# Patient Record
Sex: Female | Born: 1945
Health system: Southern US, Community
[De-identification: ages and names within clinical notes are randomized; demographics above are authoritative.]

## PROBLEM LIST (undated history)

## (undated) DIAGNOSIS — R399 Unspecified symptoms and signs involving the genitourinary system: Secondary | ICD-10-CM

## (undated) DIAGNOSIS — R319 Hematuria, unspecified: Secondary | ICD-10-CM

## (undated) DIAGNOSIS — Z860101 Personal history of adenomatous and serrated colon polyps: Secondary | ICD-10-CM

## (undated) DIAGNOSIS — Z87442 Personal history of urinary calculi: Secondary | ICD-10-CM

## (undated) DIAGNOSIS — Z86010 Personal history of colonic polyps: Secondary | ICD-10-CM

## (undated) DIAGNOSIS — L309 Dermatitis, unspecified: Secondary | ICD-10-CM

## (undated) DIAGNOSIS — Z8719 Personal history of other diseases of the digestive system: Secondary | ICD-10-CM

## (undated) DIAGNOSIS — K5730 Diverticulosis of large intestine without perforation or abscess without bleeding: Secondary | ICD-10-CM

## (undated) DIAGNOSIS — E785 Hyperlipidemia, unspecified: Secondary | ICD-10-CM

## (undated) DIAGNOSIS — J189 Pneumonia, unspecified organism: Secondary | ICD-10-CM

## (undated) DIAGNOSIS — C801 Malignant (primary) neoplasm, unspecified: Secondary | ICD-10-CM

## (undated) DIAGNOSIS — Z973 Presence of spectacles and contact lenses: Secondary | ICD-10-CM

## (undated) DIAGNOSIS — R600 Localized edema: Secondary | ICD-10-CM

## (undated) DIAGNOSIS — K449 Diaphragmatic hernia without obstruction or gangrene: Secondary | ICD-10-CM

## (undated) DIAGNOSIS — D494 Neoplasm of unspecified behavior of bladder: Secondary | ICD-10-CM

## (undated) DIAGNOSIS — C679 Malignant neoplasm of bladder, unspecified: Secondary | ICD-10-CM

## (undated) DIAGNOSIS — Z923 Personal history of irradiation: Secondary | ICD-10-CM

## (undated) DIAGNOSIS — K2970 Gastritis, unspecified, without bleeding: Secondary | ICD-10-CM

## (undated) DIAGNOSIS — I10 Essential (primary) hypertension: Secondary | ICD-10-CM

## (undated) DIAGNOSIS — K5792 Diverticulitis of intestine, part unspecified, without perforation or abscess without bleeding: Secondary | ICD-10-CM

## (undated) DIAGNOSIS — N200 Calculus of kidney: Secondary | ICD-10-CM

## (undated) DIAGNOSIS — K635 Polyp of colon: Secondary | ICD-10-CM

## (undated) DIAGNOSIS — I2510 Atherosclerotic heart disease of native coronary artery without angina pectoris: Secondary | ICD-10-CM

## (undated) DIAGNOSIS — K219 Gastro-esophageal reflux disease without esophagitis: Secondary | ICD-10-CM

## (undated) DIAGNOSIS — Z972 Presence of dental prosthetic device (complete) (partial): Secondary | ICD-10-CM

## (undated) DIAGNOSIS — M797 Fibromyalgia: Secondary | ICD-10-CM

## (undated) DIAGNOSIS — Z5189 Encounter for other specified aftercare: Secondary | ICD-10-CM

## (undated) DIAGNOSIS — I890 Lymphedema, not elsewhere classified: Secondary | ICD-10-CM

## (undated) HISTORY — DX: Hyperlipidemia, unspecified: E78.5

## (undated) HISTORY — PX: CYST EXCISION: SHX5701

## (undated) HISTORY — DX: Polyp of colon: K63.5

## (undated) HISTORY — DX: Malignant (primary) neoplasm, unspecified: C80.1

## (undated) HISTORY — PX: APPENDECTOMY: SHX54

## (undated) HISTORY — DX: Essential (primary) hypertension: I10

## (undated) HISTORY — DX: Gastro-esophageal reflux disease without esophagitis: K21.9

## (undated) HISTORY — DX: Diverticulitis of intestine, part unspecified, without perforation or abscess without bleeding: K57.92

## (undated) HISTORY — DX: Atherosclerotic heart disease of native coronary artery without angina pectoris: I25.10

## (undated) HISTORY — DX: Gastritis, unspecified, without bleeding: K29.70

## (undated) HISTORY — PX: TONSILLECTOMY: SUR1361

## (undated) HISTORY — DX: Malignant neoplasm of bladder, unspecified: C67.9

## (undated) HISTORY — PX: ABDOMINAL HYSTERECTOMY: SHX81

## (undated) HISTORY — PX: ROTATOR CUFF REPAIR: SHX139

## (undated) HISTORY — DX: Fibromyalgia: M79.7

## (undated) HISTORY — PX: BREAST SURGERY: SHX581

## (undated) HISTORY — PX: OTHER SURGICAL HISTORY: SHX169

## (undated) HISTORY — PX: NASAL SINUS SURGERY: SHX719

## (undated) HISTORY — PX: ANTERIOR AND POSTERIOR REPAIR WITH SACROSPINOUS FIXATION: SHX6536

## (undated) HISTORY — DX: Encounter for other specified aftercare: Z51.89

## (undated) HISTORY — PX: BREAST RECONSTRUCTION: SHX9

## (undated) HISTORY — PX: CARDIAC CATHETERIZATION: SHX172

## (undated) HISTORY — PX: ABDOMINAL ADHESION SURGERY: SHX90

## (undated) HISTORY — PX: KNEE SURGERY: SHX244

---

## 1976-04-22 HISTORY — PX: ABDOMINAL HYSTERECTOMY: SHX81

## 1983-04-23 HISTORY — PX: ROTATOR CUFF REPAIR: SHX139

## 1984-04-22 HISTORY — PX: KNEE ARTHROSCOPY: SUR90

## 1997-11-24 ENCOUNTER — Encounter: Admission: RE | Admit: 1997-11-24 | Discharge: 1998-02-22 | Payer: Self-pay | Admitting: Specialist

## 1998-01-31 ENCOUNTER — Observation Stay (HOSPITAL_COMMUNITY): Admission: RE | Admit: 1998-01-31 | Discharge: 1998-02-01 | Payer: Self-pay | Admitting: Orthopedic Surgery

## 1998-01-31 ENCOUNTER — Encounter: Payer: Self-pay | Admitting: Orthopedic Surgery

## 1998-04-22 DIAGNOSIS — Z923 Personal history of irradiation: Secondary | ICD-10-CM

## 1998-04-22 DIAGNOSIS — Z9221 Personal history of antineoplastic chemotherapy: Secondary | ICD-10-CM

## 1998-04-22 DIAGNOSIS — Z853 Personal history of malignant neoplasm of breast: Secondary | ICD-10-CM

## 1998-04-22 HISTORY — PX: REDUCTION MAMMAPLASTY: SUR839

## 1998-04-22 HISTORY — PX: BREAST REDUCTION SURGERY: SHX8

## 1998-04-22 HISTORY — PX: MASTECTOMY: SHX3

## 1998-04-22 HISTORY — DX: Personal history of malignant neoplasm of breast: Z85.3

## 1998-04-22 HISTORY — DX: Personal history of antineoplastic chemotherapy: Z92.21

## 1998-04-22 HISTORY — DX: Personal history of irradiation: Z92.3

## 1998-04-22 HISTORY — PX: MASTECTOMY WITH AXILLARY LYMPH NODE DISSECTION: SHX5661

## 1998-07-14 ENCOUNTER — Ambulatory Visit (HOSPITAL_COMMUNITY): Admission: RE | Admit: 1998-07-14 | Discharge: 1998-07-14 | Payer: Self-pay | Admitting: Orthopedic Surgery

## 1998-12-12 ENCOUNTER — Other Ambulatory Visit: Admission: RE | Admit: 1998-12-12 | Discharge: 1998-12-12 | Payer: Self-pay | Admitting: General Surgery

## 1998-12-29 ENCOUNTER — Inpatient Hospital Stay (HOSPITAL_COMMUNITY): Admission: AD | Admit: 1998-12-29 | Discharge: 1999-01-03 | Payer: Self-pay | Admitting: General Surgery

## 1999-02-06 ENCOUNTER — Encounter: Admission: RE | Admit: 1999-02-06 | Discharge: 1999-04-04 | Payer: Self-pay | Admitting: Plastic Surgery

## 1999-02-13 ENCOUNTER — Encounter: Payer: Self-pay | Admitting: *Deleted

## 1999-02-13 ENCOUNTER — Ambulatory Visit (HOSPITAL_COMMUNITY): Admission: RE | Admit: 1999-02-13 | Discharge: 1999-02-13 | Payer: Self-pay | Admitting: *Deleted

## 1999-02-14 ENCOUNTER — Ambulatory Visit (HOSPITAL_COMMUNITY): Admission: RE | Admit: 1999-02-14 | Discharge: 1999-02-14 | Payer: Self-pay | Admitting: General Surgery

## 1999-02-14 ENCOUNTER — Encounter (HOSPITAL_BASED_OUTPATIENT_CLINIC_OR_DEPARTMENT_OTHER): Payer: Self-pay | Admitting: General Surgery

## 1999-02-15 ENCOUNTER — Encounter: Payer: Self-pay | Admitting: *Deleted

## 1999-02-15 ENCOUNTER — Ambulatory Visit (HOSPITAL_COMMUNITY): Admission: RE | Admit: 1999-02-15 | Discharge: 1999-02-15 | Payer: Self-pay | Admitting: *Deleted

## 1999-03-30 ENCOUNTER — Ambulatory Visit (HOSPITAL_COMMUNITY): Admission: RE | Admit: 1999-03-30 | Discharge: 1999-03-30 | Payer: Self-pay | Admitting: *Deleted

## 1999-03-30 ENCOUNTER — Encounter: Payer: Self-pay | Admitting: Gastroenterology

## 1999-07-13 ENCOUNTER — Encounter: Payer: Self-pay | Admitting: *Deleted

## 1999-07-13 ENCOUNTER — Encounter: Admission: RE | Admit: 1999-07-13 | Discharge: 1999-07-13 | Payer: Self-pay | Admitting: *Deleted

## 1999-07-23 ENCOUNTER — Ambulatory Visit (HOSPITAL_COMMUNITY): Admission: RE | Admit: 1999-07-23 | Discharge: 1999-07-23 | Payer: Self-pay | Admitting: *Deleted

## 1999-07-23 ENCOUNTER — Encounter: Payer: Self-pay | Admitting: *Deleted

## 1999-07-25 ENCOUNTER — Encounter: Admission: RE | Admit: 1999-07-25 | Discharge: 1999-10-23 | Payer: Self-pay | Admitting: Radiation Oncology

## 1999-08-31 ENCOUNTER — Other Ambulatory Visit: Admission: RE | Admit: 1999-08-31 | Discharge: 1999-08-31 | Payer: Self-pay | Admitting: Obstetrics and Gynecology

## 1999-09-04 ENCOUNTER — Encounter: Admission: RE | Admit: 1999-09-04 | Discharge: 1999-09-04 | Payer: Self-pay | Admitting: Obstetrics and Gynecology

## 1999-09-04 ENCOUNTER — Encounter: Payer: Self-pay | Admitting: Obstetrics and Gynecology

## 1999-09-24 ENCOUNTER — Ambulatory Visit (HOSPITAL_COMMUNITY): Admission: RE | Admit: 1999-09-24 | Discharge: 1999-09-24 | Payer: Self-pay | Admitting: Oncology

## 1999-09-27 ENCOUNTER — Encounter: Admission: RE | Admit: 1999-09-27 | Discharge: 1999-09-27 | Payer: Self-pay | Admitting: *Deleted

## 1999-09-27 ENCOUNTER — Encounter: Payer: Self-pay | Admitting: *Deleted

## 1999-10-02 ENCOUNTER — Ambulatory Visit (HOSPITAL_COMMUNITY): Admission: RE | Admit: 1999-10-02 | Discharge: 1999-10-02 | Payer: Self-pay | Admitting: *Deleted

## 1999-10-02 ENCOUNTER — Encounter: Payer: Self-pay | Admitting: *Deleted

## 1999-10-10 ENCOUNTER — Encounter: Admission: RE | Admit: 1999-10-10 | Discharge: 1999-12-03 | Payer: Self-pay | Admitting: *Deleted

## 1999-11-06 ENCOUNTER — Encounter (INDEPENDENT_AMBULATORY_CARE_PROVIDER_SITE_OTHER): Payer: Self-pay | Admitting: *Deleted

## 1999-11-06 ENCOUNTER — Ambulatory Visit (HOSPITAL_COMMUNITY): Admission: RE | Admit: 1999-11-06 | Discharge: 1999-11-06 | Payer: Self-pay | Admitting: General Surgery

## 1999-11-06 ENCOUNTER — Ambulatory Visit (HOSPITAL_COMMUNITY): Admission: RE | Admit: 1999-11-06 | Discharge: 1999-11-06 | Payer: Self-pay | Admitting: Gastroenterology

## 2000-01-16 ENCOUNTER — Ambulatory Visit (HOSPITAL_COMMUNITY): Admission: RE | Admit: 2000-01-16 | Discharge: 2000-01-16 | Payer: Self-pay | Admitting: Gastroenterology

## 2000-01-16 ENCOUNTER — Encounter: Payer: Self-pay | Admitting: Gastroenterology

## 2000-01-23 ENCOUNTER — Ambulatory Visit (HOSPITAL_COMMUNITY): Admission: RE | Admit: 2000-01-23 | Discharge: 2000-01-23 | Payer: Self-pay | Admitting: *Deleted

## 2000-01-23 ENCOUNTER — Encounter: Payer: Self-pay | Admitting: *Deleted

## 2000-01-29 ENCOUNTER — Encounter: Admission: RE | Admit: 2000-01-29 | Discharge: 2000-01-29 | Payer: Self-pay | Admitting: Nephrology

## 2000-01-29 ENCOUNTER — Encounter: Payer: Self-pay | Admitting: Nephrology

## 2000-02-06 ENCOUNTER — Encounter (INDEPENDENT_AMBULATORY_CARE_PROVIDER_SITE_OTHER): Payer: Self-pay | Admitting: *Deleted

## 2000-02-06 ENCOUNTER — Ambulatory Visit (HOSPITAL_BASED_OUTPATIENT_CLINIC_OR_DEPARTMENT_OTHER): Admission: RE | Admit: 2000-02-06 | Discharge: 2000-02-06 | Payer: Self-pay | Admitting: Plastic Surgery

## 2000-03-18 ENCOUNTER — Ambulatory Visit (HOSPITAL_COMMUNITY): Admission: RE | Admit: 2000-03-18 | Discharge: 2000-03-18 | Payer: Self-pay | Admitting: Gastroenterology

## 2000-07-03 ENCOUNTER — Encounter: Payer: Self-pay | Admitting: *Deleted

## 2000-07-03 ENCOUNTER — Encounter: Admission: RE | Admit: 2000-07-03 | Discharge: 2000-07-03 | Payer: Self-pay | Admitting: *Deleted

## 2000-07-07 ENCOUNTER — Ambulatory Visit (HOSPITAL_BASED_OUTPATIENT_CLINIC_OR_DEPARTMENT_OTHER): Admission: RE | Admit: 2000-07-07 | Discharge: 2000-07-07 | Payer: Self-pay | Admitting: Orthopedic Surgery

## 2000-09-04 ENCOUNTER — Other Ambulatory Visit: Admission: RE | Admit: 2000-09-04 | Discharge: 2000-09-04 | Payer: Self-pay | Admitting: Obstetrics and Gynecology

## 2001-05-25 ENCOUNTER — Encounter: Payer: Self-pay | Admitting: Oncology

## 2001-05-25 ENCOUNTER — Ambulatory Visit (HOSPITAL_COMMUNITY): Admission: RE | Admit: 2001-05-25 | Discharge: 2001-05-25 | Payer: Self-pay | Admitting: Oncology

## 2001-08-06 ENCOUNTER — Encounter: Payer: Self-pay | Admitting: Oncology

## 2001-08-06 ENCOUNTER — Encounter: Admission: RE | Admit: 2001-08-06 | Discharge: 2001-08-06 | Payer: Self-pay | Admitting: Oncology

## 2001-08-12 ENCOUNTER — Encounter: Admission: RE | Admit: 2001-08-12 | Discharge: 2001-08-12 | Payer: Self-pay | Admitting: Oncology

## 2001-08-12 ENCOUNTER — Encounter: Payer: Self-pay | Admitting: Oncology

## 2002-01-25 ENCOUNTER — Encounter: Admission: RE | Admit: 2002-01-25 | Discharge: 2002-01-25 | Payer: Self-pay | Admitting: Oncology

## 2002-01-25 ENCOUNTER — Encounter: Payer: Self-pay | Admitting: Oncology

## 2002-02-03 ENCOUNTER — Encounter: Payer: Self-pay | Admitting: Rheumatology

## 2002-02-03 ENCOUNTER — Ambulatory Visit (HOSPITAL_COMMUNITY): Admission: RE | Admit: 2002-02-03 | Discharge: 2002-02-03 | Payer: Self-pay | Admitting: Interventional Radiology

## 2002-08-24 ENCOUNTER — Encounter: Payer: Self-pay | Admitting: Rheumatology

## 2002-08-24 ENCOUNTER — Ambulatory Visit (HOSPITAL_COMMUNITY): Admission: RE | Admit: 2002-08-24 | Discharge: 2002-08-24 | Payer: Self-pay | Admitting: Rheumatology

## 2002-09-09 ENCOUNTER — Ambulatory Visit (HOSPITAL_COMMUNITY): Admission: RE | Admit: 2002-09-09 | Discharge: 2002-09-09 | Payer: Self-pay | Admitting: Rheumatology

## 2002-09-09 ENCOUNTER — Encounter: Payer: Self-pay | Admitting: Rheumatology

## 2002-09-16 ENCOUNTER — Encounter: Payer: Self-pay | Admitting: Nephrology

## 2002-09-16 ENCOUNTER — Encounter: Admission: RE | Admit: 2002-09-16 | Discharge: 2002-09-16 | Payer: Self-pay | Admitting: Nephrology

## 2002-09-22 ENCOUNTER — Encounter: Payer: Self-pay | Admitting: Cardiology

## 2002-09-22 ENCOUNTER — Ambulatory Visit (HOSPITAL_COMMUNITY): Admission: RE | Admit: 2002-09-22 | Discharge: 2002-09-22 | Payer: Self-pay | Admitting: Cardiology

## 2002-12-16 ENCOUNTER — Other Ambulatory Visit: Admission: RE | Admit: 2002-12-16 | Discharge: 2002-12-16 | Payer: Self-pay | Admitting: Gynecology

## 2002-12-17 ENCOUNTER — Encounter: Admission: RE | Admit: 2002-12-17 | Discharge: 2002-12-17 | Payer: Self-pay | Admitting: Oncology

## 2002-12-17 ENCOUNTER — Encounter: Payer: Self-pay | Admitting: Oncology

## 2003-01-07 ENCOUNTER — Ambulatory Visit (HOSPITAL_COMMUNITY): Admission: RE | Admit: 2003-01-07 | Discharge: 2003-01-07 | Payer: Self-pay | Admitting: Gastroenterology

## 2003-02-18 ENCOUNTER — Encounter: Admission: RE | Admit: 2003-02-18 | Discharge: 2003-02-18 | Payer: Self-pay | Admitting: Oncology

## 2003-05-10 ENCOUNTER — Inpatient Hospital Stay (HOSPITAL_BASED_OUTPATIENT_CLINIC_OR_DEPARTMENT_OTHER): Admission: RE | Admit: 2003-05-10 | Discharge: 2003-05-10 | Payer: Self-pay | Admitting: Cardiology

## 2003-06-28 ENCOUNTER — Encounter: Admission: RE | Admit: 2003-06-28 | Discharge: 2003-06-28 | Payer: Self-pay | Admitting: Nephrology

## 2003-11-05 ENCOUNTER — Encounter: Payer: Self-pay | Admitting: Emergency Medicine

## 2003-11-06 ENCOUNTER — Inpatient Hospital Stay (HOSPITAL_COMMUNITY): Admission: AD | Admit: 2003-11-06 | Discharge: 2003-11-11 | Payer: Self-pay | Admitting: Pulmonary Disease

## 2003-12-05 ENCOUNTER — Ambulatory Visit (HOSPITAL_COMMUNITY): Admission: RE | Admit: 2003-12-05 | Discharge: 2003-12-05 | Payer: Self-pay | Admitting: Gastroenterology

## 2003-12-19 ENCOUNTER — Other Ambulatory Visit: Admission: RE | Admit: 2003-12-19 | Discharge: 2003-12-19 | Payer: Self-pay | Admitting: Gynecology

## 2004-02-17 ENCOUNTER — Encounter: Admission: RE | Admit: 2004-02-17 | Discharge: 2004-02-17 | Payer: Self-pay | Admitting: Oncology

## 2004-05-16 ENCOUNTER — Encounter: Admission: RE | Admit: 2004-05-16 | Discharge: 2004-05-16 | Payer: Self-pay | Admitting: Nephrology

## 2004-05-21 ENCOUNTER — Ambulatory Visit: Payer: Self-pay | Admitting: Oncology

## 2004-05-24 ENCOUNTER — Encounter: Admission: RE | Admit: 2004-05-24 | Discharge: 2004-06-22 | Payer: Self-pay | Admitting: Nephrology

## 2004-07-03 ENCOUNTER — Encounter: Admission: RE | Admit: 2004-07-03 | Discharge: 2004-07-03 | Payer: Self-pay | Admitting: Cardiology

## 2004-07-12 ENCOUNTER — Inpatient Hospital Stay (HOSPITAL_COMMUNITY): Admission: EM | Admit: 2004-07-12 | Discharge: 2004-07-23 | Payer: Self-pay | Admitting: Family Medicine

## 2004-07-13 ENCOUNTER — Encounter: Payer: Self-pay | Admitting: Nephrology

## 2004-07-30 ENCOUNTER — Encounter: Admission: RE | Admit: 2004-07-30 | Discharge: 2004-07-30 | Payer: Self-pay | Admitting: Nephrology

## 2004-10-05 ENCOUNTER — Ambulatory Visit (HOSPITAL_COMMUNITY): Admission: RE | Admit: 2004-10-05 | Discharge: 2004-10-05 | Payer: Self-pay | Admitting: Gastroenterology

## 2004-10-05 ENCOUNTER — Encounter (INDEPENDENT_AMBULATORY_CARE_PROVIDER_SITE_OTHER): Payer: Self-pay | Admitting: *Deleted

## 2004-11-19 ENCOUNTER — Ambulatory Visit: Payer: Self-pay | Admitting: Oncology

## 2004-11-27 ENCOUNTER — Ambulatory Visit (HOSPITAL_COMMUNITY): Admission: RE | Admit: 2004-11-27 | Discharge: 2004-11-27 | Payer: Self-pay | Admitting: Oncology

## 2005-01-03 ENCOUNTER — Other Ambulatory Visit: Admission: RE | Admit: 2005-01-03 | Discharge: 2005-01-03 | Payer: Self-pay | Admitting: Gynecology

## 2005-01-07 ENCOUNTER — Encounter: Admission: RE | Admit: 2005-01-07 | Discharge: 2005-01-07 | Payer: Self-pay | Admitting: Gastroenterology

## 2005-02-18 ENCOUNTER — Encounter: Admission: RE | Admit: 2005-02-18 | Discharge: 2005-02-18 | Payer: Self-pay | Admitting: Oncology

## 2005-05-01 ENCOUNTER — Encounter (HOSPITAL_COMMUNITY): Admission: RE | Admit: 2005-05-01 | Discharge: 2005-07-30 | Payer: Self-pay | Admitting: Cardiology

## 2005-07-16 ENCOUNTER — Encounter: Admission: RE | Admit: 2005-07-16 | Discharge: 2005-07-16 | Payer: Self-pay | Admitting: Nephrology

## 2005-08-08 ENCOUNTER — Ambulatory Visit: Payer: Self-pay | Admitting: Oncology

## 2005-10-04 ENCOUNTER — Encounter: Admission: RE | Admit: 2005-10-04 | Discharge: 2005-10-04 | Payer: Self-pay | Admitting: Orthopaedic Surgery

## 2005-11-18 ENCOUNTER — Encounter: Admission: RE | Admit: 2005-11-18 | Discharge: 2005-11-18 | Payer: Self-pay | Admitting: Orthopaedic Surgery

## 2006-01-06 ENCOUNTER — Other Ambulatory Visit: Admission: RE | Admit: 2006-01-06 | Discharge: 2006-01-06 | Payer: Self-pay | Admitting: Gynecology

## 2006-02-19 ENCOUNTER — Encounter: Admission: RE | Admit: 2006-02-19 | Discharge: 2006-02-19 | Payer: Self-pay | Admitting: Oncology

## 2006-04-24 ENCOUNTER — Encounter: Admission: RE | Admit: 2006-04-24 | Discharge: 2006-04-24 | Payer: Self-pay | Admitting: Gastroenterology

## 2006-06-23 ENCOUNTER — Encounter: Admission: RE | Admit: 2006-06-23 | Discharge: 2006-06-23 | Payer: Self-pay | Admitting: Internal Medicine

## 2006-09-05 ENCOUNTER — Ambulatory Visit: Payer: Self-pay | Admitting: Oncology

## 2006-09-09 LAB — COMPREHENSIVE METABOLIC PANEL
ALT: 10 U/L (ref 0–35)
Albumin: 4.6 g/dL (ref 3.5–5.2)
CO2: 24 mEq/L (ref 19–32)
Calcium: 9.8 mg/dL (ref 8.4–10.5)
Chloride: 106 mEq/L (ref 96–112)
Glucose, Bld: 93 mg/dL (ref 70–99)
Potassium: 3.6 mEq/L (ref 3.5–5.3)
Sodium: 142 mEq/L (ref 135–145)
Total Protein: 6.9 g/dL (ref 6.0–8.3)

## 2006-09-09 LAB — CBC WITH DIFFERENTIAL/PLATELET
Basophils Absolute: 0 10*3/uL (ref 0.0–0.1)
Eosinophils Absolute: 0.1 10*3/uL (ref 0.0–0.5)
HGB: 12.2 g/dL (ref 11.6–15.9)
MCV: 79.6 fL — ABNORMAL LOW (ref 81.0–101.0)
MONO#: 0.3 10*3/uL (ref 0.1–0.9)
MONO%: 7.7 % (ref 0.0–13.0)
NEUT#: 1.7 10*3/uL (ref 1.5–6.5)
RDW: 14.4 % (ref 11.3–14.5)
WBC: 3.6 10*3/uL — ABNORMAL LOW (ref 3.9–10.0)

## 2007-01-22 ENCOUNTER — Other Ambulatory Visit: Admission: RE | Admit: 2007-01-22 | Discharge: 2007-01-22 | Payer: Self-pay | Admitting: Gynecology

## 2007-02-19 ENCOUNTER — Encounter: Payer: Self-pay | Admitting: Gastroenterology

## 2007-03-16 ENCOUNTER — Encounter: Admission: RE | Admit: 2007-03-16 | Discharge: 2007-03-16 | Payer: Self-pay | Admitting: Gynecology

## 2007-09-04 ENCOUNTER — Ambulatory Visit: Payer: Self-pay | Admitting: Oncology

## 2008-01-11 ENCOUNTER — Emergency Department (HOSPITAL_COMMUNITY): Admission: EM | Admit: 2008-01-11 | Discharge: 2008-01-11 | Payer: Self-pay | Admitting: Emergency Medicine

## 2008-01-12 ENCOUNTER — Emergency Department (HOSPITAL_COMMUNITY): Admission: EM | Admit: 2008-01-12 | Discharge: 2008-01-12 | Payer: Self-pay | Admitting: *Deleted

## 2008-03-16 ENCOUNTER — Encounter: Admission: RE | Admit: 2008-03-16 | Discharge: 2008-03-16 | Payer: Self-pay | Admitting: Oncology

## 2008-04-22 HISTORY — PX: ANTERIOR AND POSTERIOR VAGINAL REPAIR: SUR5

## 2008-07-11 ENCOUNTER — Ambulatory Visit (HOSPITAL_BASED_OUTPATIENT_CLINIC_OR_DEPARTMENT_OTHER): Admission: RE | Admit: 2008-07-11 | Discharge: 2008-07-12 | Payer: Self-pay | Admitting: Gynecology

## 2008-09-06 ENCOUNTER — Ambulatory Visit: Payer: Self-pay | Admitting: Oncology

## 2008-10-05 LAB — COMPREHENSIVE METABOLIC PANEL
ALT: <8 U/L (ref 0–35)
AST: 12 U/L (ref 0–37)
BUN: 11 mg/dL (ref 6–23)
CO2: 23 mEq/L (ref 19–32)
Creatinine, Ser: 0.88 mg/dL (ref 0.40–1.20)
Total Bilirubin: 0.4 mg/dL (ref 0.3–1.2)

## 2008-10-05 LAB — CBC WITH DIFFERENTIAL/PLATELET
BASO%: 0.5 % (ref 0.0–2.0)
EOS%: 1.8 % (ref 0.0–7.0)
HCT: 38.2 % (ref 34.8–46.6)
LYMPH%: 40.4 % (ref 14.0–49.7)
MCH: 26.4 pg (ref 25.1–34.0)
MCHC: 32.7 g/dL (ref 31.5–36.0)
NEUT%: 50.6 % (ref 38.4–76.8)
Platelets: 163 10*3/uL (ref 145–400)

## 2008-10-05 LAB — LACTATE DEHYDROGENASE: LDH: 196 U/L (ref 94–250)

## 2009-03-20 ENCOUNTER — Encounter: Admission: RE | Admit: 2009-03-20 | Discharge: 2009-03-20 | Payer: Self-pay | Admitting: Oncology

## 2009-04-17 ENCOUNTER — Emergency Department (HOSPITAL_COMMUNITY): Admission: EM | Admit: 2009-04-17 | Discharge: 2009-04-17 | Payer: Self-pay | Admitting: Emergency Medicine

## 2010-01-29 ENCOUNTER — Emergency Department (HOSPITAL_COMMUNITY): Admission: EM | Admit: 2010-01-29 | Discharge: 2010-01-29 | Payer: Self-pay | Admitting: Family Medicine

## 2010-02-22 ENCOUNTER — Encounter: Admission: RE | Admit: 2010-02-22 | Discharge: 2010-02-22 | Payer: Self-pay | Admitting: Gynecology

## 2010-02-26 ENCOUNTER — Encounter
Admission: RE | Admit: 2010-02-26 | Discharge: 2010-04-21 | Payer: Self-pay | Source: Home / Self Care | Attending: Nephrology | Admitting: Nephrology

## 2010-04-26 ENCOUNTER — Encounter
Admission: RE | Admit: 2010-04-26 | Discharge: 2010-05-03 | Payer: Self-pay | Source: Home / Self Care | Attending: Nephrology | Admitting: Nephrology

## 2010-05-13 ENCOUNTER — Encounter: Payer: Self-pay | Admitting: Orthopaedic Surgery

## 2010-05-13 ENCOUNTER — Encounter: Payer: Self-pay | Admitting: Oncology

## 2010-07-03 ENCOUNTER — Ambulatory Visit (INDEPENDENT_AMBULATORY_CARE_PROVIDER_SITE_OTHER): Payer: MEDICARE

## 2010-07-03 ENCOUNTER — Inpatient Hospital Stay (INDEPENDENT_AMBULATORY_CARE_PROVIDER_SITE_OTHER)
Admission: RE | Admit: 2010-07-03 | Discharge: 2010-07-03 | Disposition: A | Discharge status: Home / Self Care | Payer: MEDICARE | Source: Ambulatory Visit | Attending: Emergency Medicine | Admitting: Emergency Medicine

## 2010-07-03 DIAGNOSIS — J019 Acute sinusitis, unspecified: Secondary | ICD-10-CM

## 2010-07-03 LAB — POCT URINALYSIS DIPSTICK
Glucose, UA: 100 mg/dL — AB
Hgb urine dipstick: NEGATIVE
Nitrite: NEGATIVE
Protein, ur: >=300 mg/dL — AB
Urobilinogen, UA: 4 mg/dL — ABNORMAL HIGH (ref 0.0–1.0)

## 2010-07-23 LAB — CULTURE, ROUTINE-ABSCESS

## 2010-08-02 LAB — POCT I-STAT 4, (NA,K, GLUC, HGB,HCT)
HCT: 41 % (ref 36.0–46.0)
Hemoglobin: 13.9 g/dL (ref 12.0–15.0)
Potassium: 3.5 mEq/L (ref 3.5–5.1)
Sodium: 142 mEq/L (ref 135–145)

## 2010-08-02 LAB — GLUCOSE, CAPILLARY: Glucose-Capillary: 145 mg/dL — ABNORMAL HIGH (ref 70–99)

## 2010-09-04 NOTE — Op Note (Signed)
NAMENATALEAH, SCIONEAUX            ACCOUNT NO.:  [id number]   MEDICAL RECORD NO.:  [id number]          PATIENT TYPE:  AMB   LOCATION:  NESC                         FACILITY:  [facility]   PHYSICIAN:  Gretta Cool, M.D. DATE OF BIRTH:  Sep 11, 1945   DATE OF PROCEDURE:  07/11/2008  DATE OF DISCHARGE:  07/12/2008                               OPERATIVE REPORT   PREOPERATIVE DIAGNOSES:  1. Severe global pelvic organ prolapse.  2. History of hysterectomy many years ago.   POSTOPERATIVE DIAGNOSES:  1. Severe global pelvic organ prolapse.  2. History of hysterectomy many years ago.   PROCEDURE:  Anterior-posterior enterocele repairs, cardinal uterosacral  colpo suspension, Bonnano suprapubic cysto cath.   SURGEON:  Gretta Cool, M.D.   ASSISTANT:  Kathee Polite, MD.   ANESTHESIA:  General orotracheal.   DESCRIPTION OF PROCEDURE:  Under excellent general anesthesia or  orotracheal intubation, the patient was prepped and draped in the  lithotomy position in [place] stirrups, the bladder drained by Foley  catheter, an incision was made initially by incision transversely at the  apex of the cystocele.  The enormous cystocele bulged through the  introitus.  Allis clamps were placed on the center for control.  The  mucosa was then undermined to the apex of the vagina.  The mucosa was  undermined from the apex of the vaginal cuff all the way to underneath  the urethra.  The mucosa was then dissected from the underlying  endopelvic fascia.  Once the enormous cystocele was exposed, a huge  central fascial defect was noted, approximately 5 cm across, allowing  the fascia to bulge through.  There also appeared to be an anterior  enterocele.  The huge defect was progressively collapsed with  pursestring sutures of 2-0 Vicryl.  Once the major portion was reduced,  the fascia was then approximated in the midline with mattress closures  of interrupted 2-0 Vicryl.  Once the cystocele was adequately  reduced,  the cardinal uterosacral complex could be identified.  The cardinal  uterosacral complex was then secured to the previous bladder pillar  fascia so as to provide a complete envelope of fascia anteriorly and  prevent recurrence of the anterior enterocele and cystocele.  The fascia  beneath the urethra was plicated so as to lift the urethra anteriorly.  The upper layers of the pubocervical fascia were then secured along with  the subcuticular closure of the anterior vaginal wall.  At this point  with complete correction of the cystocele, attention was then turned to  the posterior repair and colpo suspension.   The posterior vaginal mucosa was grasped with Allis clamps and a V-  shaped incision made posteriorly to remove the redundant intervening  skin.  The mucosa was then dissected free from the underlying perirectal  fascia.  The incision was carried to the apex of the vaginal cuff.  A  large fascial defect was noted extending two-thirds of the way down the  length of the vagina with enterocele and rectocele bulging through.  The  enterocele was first plicated and reduced with pursestring sutures.  The  fascia was  then closed with interrupted mattress sutures to reinforce  the attenuated fascia.  At this point the cardinal uterosacral ligament  complex was identified by grasping the dimple vaginally and then  accentuating the uterosacral complex.  The complex was then secured with  0 Novofil to the detached perirectal fascia and the fascia was pulled up  to a high plication of the uterosacral cardinal complex.  At this point  the anterior plication of the fascia to the cardinal uterosacral complex  was secured to the midline so as to, again, complete posteriorly a  complete envelope of fascia.  The perirectal fascia was then plicated in  the midline from the apex to the introitus with running suture of 0  Vicryl.  The mucosa was then trimmed and the upper layers of perirectal   fascia and mucosa closed in a subcuticular closure.  At the end of the  procedure the sponge and lap counts were correct.  There were no  complications.  The vagina remained adequate by digital.  There was no  evidence of complication.  The procedure was terminated without  complication.  A Bonnano suprapubic cysto cath was placed after her  bladder was filled with 300 mL of saline irrigation.  The catheter was  secured with interrupted sutures of 2-0 Novofil.  At this point the  procedure was terminated without complication.  The patient was returned  to the recovery room in excellent condition.           ______________________________  Gretta Cool, M.D.     CWL/MEDQ  D:  07/12/2008  T:  07/12/2008  Job:  [id number]   cc:   Gretta Cool, M.D.  Fax: [phone]   Kathee Polite, MD   Eduardo Osier Sharyn Lull, M.D.  Fax: [phone]

## 2010-09-07 NOTE — Cardiovascular Report (Signed)
NAME:  [name], [name]                      ACCOUNT NO.:  [id number]   MEDICAL RECORD NO.:  [id number]                   PATIENT TYPE:  OIB   LOCATION:  6501                                 FACILITY:  [facility]   PHYSICIAN:  Mohan N. Sharyn Lull, M.D.              DATE OF BIRTH:  01-29-1946   DATE OF PROCEDURE:  05/10/2003  DATE OF DISCHARGE:  05/10/2003                              CARDIAC CATHETERIZATION   PROCEDURE:  Left cardiac catheterization with selective left and right  coronary angiography, left ventriculography via right groin using Judkins  technique.   INDICATIONS FOR PROCEDURE:  Ms. Balsam is a 65 year old black female with  past medical history significant for hypertension, hypercholesterolemia,  fibromyalgia, depression, history of CA of breast.  Complains of recurrent  retrosternal chest pain with minimal exertion radiating to the right arm  associated with diaphoresis, lasting a few minutes.  The patient states she  took 1 sublingual nitroglycerin with relief of chest pain.  The patient also  gives a history of exertional dyspnea associated with feeling weak and  tired.  Denies any PND, orthopnea, leg swelling.  Denies palpitations,  lightheadedness, or syncope.   PAST MEDICAL HISTORY:  As above.   PAST SURGICAL HISTORY:  1. She had mastectomy in 2000.  Status post chemotherapy and radiation.  2. Had tonsillectomy many years ago.  3. Status post rotator cuff tear surgery.  4. Status post right foot surgery.  5. Status post laparotomy for resection of adhesions.  6. Status post appendectomy in the past.   ALLERGIES:  She is allergic to CODEINE.   MEDICATIONS AT HOME:  1. Lipitor 10 mg p.o. daily.  2. Hyzaar 50/12.5 mg p.o. daily.  3. Nexium 40 mg p.o. daily.  4. Lorazepam 0.5 mg.  5. Effexor XR 100 daily.  6. Toprol-XL 50 mg daily.  7. Baby aspirin 1 p.o. daily.  8. Nitrostat sublingual p.r.n.   SOCIAL HISTORY:  She is married.  Smoked 1 pack per day for 30  years, quit  in 2000.  No history of alcohol abuse.  Presently on disability.  Worked as  Designer, industrial/product.  Born in [place].   FAMILY HISTORY:  Father died of accidental death at the age of 17.  Mother  is alive; she is in her 61s.  She has 2 brothers and 2 sisters.  All of them  are hypertensive.   EXAMINATION:  GENERAL:  Alert, awake, oriented x3, in no acute distress.  VITAL SIGNS:  Blood pressure is 130/70, pulse of 80 and regular.  HEENT:  Conjunctivae pink.  NECK:  Supple, no JVD, no bruit.  LUNGS:  Clear to auscultation, without rhonchi or rales.  CARDIOVASCULAR:  S1, S2 normal.  There was no S3 or S4 gallop.  ABDOMEN:  Soft.  Bowel sounds are present.  Nontender.  EXTREMITIES:  There is no clubbing, cyanosis, or edema.   EKG showed normal sinus rhythm with nondiagnostic Q-waves  in the inferior  leads.  Cannot rule out inferior MI, age undetermined.   IMPRESSION:  1. Recurrent chest pain, exertional dyspnea, rule out coronary     insufficiency.  2. Hypertension.  3. Hypercholesterolemia.  4. Fibromyalgia.  5. Depression.  6. History of carcinoma of the breast.   Discussed with the patient regarding left catheterization, its risk, i.e.,  death, MI, stroke, need for emergency CABG, risk of restenosis.  Discussed  emergency CABG, local vascular complications, etc., and consented for the  procedure.   PROCEDURE:  After obtaining informed consent the patient was brought to the  catheterization laboratory and was placed on fluoroscopic table.  The right  groin was prepped and draped in the usual fashion.  Two percent Xylocaine  was used for local anesthesia in the right groin.  With the help of a thin-  walled needle, a 4-French arterial sheath was placed.  The sheath was  aspirated and flushed.  Next, a 4-French left Judkins catheter was advanced  over the wire under fluoroscopic guidance up the ascending aorta.  The wire  was pulled out, and the catheter was aspirated  and connected to the  manifold.  The catheter was further advanced and engaged into the left  coronary ostium.  Multiple views of the left system were taken.   Next, the catheter was disengaged and was pulled out over the wire and was  replaced with a 4-French right Judkins catheter which was advanced over the  wire under fluoroscopic guidance up to the ascending aorta.  The wire was  pulled out.  The catheter was aspirated and connected to the manifold.  The  catheter was further advanced and engaged into the right coronary ostium.  Multiple views of the right system were taken.  Next, the catheter was  disengaged and was pulled out over the wire and was replaced with a 4-French  pigtail catheter which was advanced over the wire under fluoroscopic  guidance up to the ascending aorta.  The wire was pulled out.  The catheter  was aspirated and connected to the manifold.  The catheter was further  advanced across the aortic valve into the LV.  LV pressures were recorded.  Next, LV-gram was done in 30-degree RAO position.  Postangiographic  pressures were recorded from LV, and then pullback pressures were recorded  from the aorta.  There was no gradient across the aortic valve.  Next, the  pigtail catheter was pulled out over the wire.  Sheaths were aspirated and  flushed.   FINDINGS:  LV showed good LV systolic function, EF of 55-60%.  There was  mild to moderate LVH.  Left main was long, which was patent.  LAD has 20-30%  sequential, proximal, and mid stenosis.  Diagonal 1 was very small,  which was patent.  Diagonal 2 was small, which was patent.  Ramus was  moderate sized, which was patent.  Left circumflex was patent.  OM 1 was  large, which was patent.  RCA was patent.  The patient tolerated the  procedure well.  There were no complications.  The patient was transferred  to recovery room in stable condition.                                              Eduardo Osier. Sharyn Lull, M.D.     MNH/MEDQ  D:  05/10/2003  T:  05/10/2003  Job:  [id number]   cc:   Catheterization Laboratory

## 2010-09-07 NOTE — Consult Note (Signed)
[name], [name]            ACCOUNT NO.:  [id number]   MEDICAL RECORD NO.:  [id number]          PATIENT TYPE:  INP   LOCATION:  0342                         [facility]ACILITY:  [facility]   PHYSICIAN:  Petra Kuba, M.D.    DATE OF BIRTH:  [birth year]   DATE OF CONSULTATION:  07/13/2004  DATE OF DISCHARGE:                                   CONSULTATION   HISTORY:  The patient seen at the request of Dr. Bascom Levels for diverticulitis.  She is familiar to my partner, Dr. Randa Evens, and I have actually seen in her  in the past as well. She does have multiple medical problems who had been  doing fairly well on Zelnorm from a constipation/gastroparesis standpoint  until about 10 days ago when she started having increased nausea and  increasing constipation. She really did not develop increasing belly pain  until the last 3 days, might have had a low-grade fever at home, but finally  when the pain got worse she presented to the emergency room. A CAT scan was  done which showed diverticulitis and she was admitted for further workup and  plans. She has not seen any blood in her bowels, had not really had a good  bowel movement in a week, had seen some mucous recently in her bowels, has  had a little bit of nausea and vomiting.   PAST MEDICAL HISTORY:  Pertinent for tonsillectomy, appendectomy, and breast  cancer. She also has high blood pressure, increased cholesterol, peripheral  neuropathy, hiatal hernia.   ALLERGIES:  CODEINE.   SOCIAL HISTORY:  Minimally drinks, does not smoke.   FAMILY HISTORY:  Pertinent for her mother with irritable bowel, a patient of  mine.   REVIEW OF SYMPTOMS:  Pertinent for no urine symptoms. Pertinent for a  colonoscopy in September 2004 which did show some diverticula but no  recurrence of her polyps.   PHYSICAL EXAMINATION:  No acute distress, lying comfortably in the bed.  Vital signs stable, afebrile. Exam is pertinent for, however, for her left  lower quadrant  being tender with no guarding or rebound, positive bowel  sounds.   ASSESSMENT:  1.  Multiple medical problems.  2.  Diverticulitis.   PLAN:  Agree with clear liquids and IV Protonix. Will broaden her  antibiotics to IV Cipro and Flagyl instead of doxycycline. Follow exams,  white count, and fever. Would go ahead and get a surgical consult p.r.n.  worsening symptoms, not getting better, or she develops rebound tenderness.  Will follow with you. I have discussed all the above with the patient and  the mother, who agree.      MEM/MEDQ  D:  07/13/2004  T:  07/13/2004  Job:  [id number]   cc:   Fayrene Fearing L. Malon Kindle., M.D.  [address]  [place]  [place] [address]  Fax: [phone]

## 2010-09-07 NOTE — Procedures (Signed)
[place]  Patient:    [name], [name]                   MRN: [id number] Proc. Date: 03/18/00 Adm. Date:  [id number] Attending:  Orland Mustard CC:         Jarome Matin, M.D.  Dolan Amen, M.D.   Procedure Report  PROCEDURE:  Esophagogastroduodenoscopy.  MEDICATIONS:  Hurricaine spray, fentanyl 50 mcg, Versed 5 mg IV.  INDICATION:  A nice woman with severe esophageal reflux disease that has been nonresponsive to standard therapy.  DESCRIPTION OF PROCEDURE:  The procedure had been explained to the patient and consent obtained.  With the patient in the left lateral decubitus position, the Olympus video endoscope was inserted blindly in the esophagus and advanced under direct visualization.  The stomach was entered, pylorus identified and passed.  Duodenum, including the bulb and the second portion, was seen well. Scope withdrawn back into the stomach.  The antrum and body were normal. Fundus and cardia seen well on retroflexed view and were normal.  There was a hiatal hernia with a widely patent GE junction, no evidence of esophagitis. Scope withdrawn, patient tolerated the procedure well.  ASSESSMENT:  Hiatal hernia with probable gastroesophageal reflux disease.  PLAN:  Continue on current medicines for now.  Will see back in the office in three to four weeks and go over the options with her at that time. DD:  03/18/00 TD:  03/18/00 Job: [id number]

## 2010-09-07 NOTE — Discharge Summary (Signed)
[name], [name]            ACCOUNT NO.:  [id number]   MEDICAL RECORD NO.:  [id number]          PATIENT TYPE:  INP   LOCATION:  1823                         FACILITY:  [facility]   PHYSICIAN:  Jarome Matin, M.D.DATE OF BIRTH:  1945-07-06   DATE OF ADMISSION:  07/12/2004  DATE OF DISCHARGE:  07/13/2004                                 DISCHARGE SUMMARY   ADMISSION DIAGNOSES:  The patient was admitted with nausea, vomiting, and  esophagitis. She was found to have diverticulitis of her colon and she had  some pneumonia. The patient has a history of breast cancer several years  ago, acute pharyngitis.   BRIEF HISTORY PHYSICAL AND HOSPITAL COURSE:  Ms. Picariello is a 65 year old  African American patient who stated that she started having abdominal pain  and a mucus-like diarrhea about four to five days prior to this admission.  She was not sure if it was something that she ate or not. She was having  abdominal pain and gas, and took a laxative. Then she started having mucous  diarrhea. Her abdomen became very painful and she came to the emergency  room. Physical exam revealed a temperature of 101.9, pulse 72, respirations  18, blood pressure 115/68.  O2 saturations were 99% on room air. She had  some Dilaudid 2 mg and when I saw her she was very sleepy, but arousable.  She would go back and forth to sleep as her talked with her. Her clear to  auscultation and percussion. She had a regular sinus rhythm. Abdomen was  tender in the left upper quadrant and left lower quadrant. She can move all  extremities and she had decreased bowel sounds. She has a past history of  left breast cancer with treatment about 5-6 years ago. She has had  chemotherapy and radiation therapy. She also has a history of severe chest  pain. She had a cardiac catheterization last year and she had about 20% to  30% stenosis in the area of the left anterior descending. She had a  mastectomy in 2000 for the breast  cancer.  She has a history of depression,  hypertension, hypercholesterolemia, and fibromyalgia. She has been on  Lipitor 10 mg daily, Hyzaar 50/12.5 mg daily, Effexor XR 100 mg daily,  Toprol XL 50 mg daily, sublingual nitroglycerin for chest pain. She had a CT  scan of her abdomen in the ER and was found to have some diverticulitis in  the left lower colon. She was started on fluids and doxycycline 500 mg IV  b.i.d., Phenergan 12.8 IV q.6h. p.r.n. nausea. D-5, 1/2 normal saline given  and Dilaudid for pain.  She says CODEINE makes her itch, so we are using a  vent. We asked Dr. Ewing Schlein to see her for diverticulitis. He agreed with clear  liquids, IV Protonix. However, he wanted to use IV Cipro and Flagyl instead  of doxycycline. I asked surgery to see her in case she had worsening  abdominal pain problems. As time passes she seemingly got better. Surgery  did see the patient, but thought conservative medical management was best at  this time,  and we continued the Cipro and the Flagyl. The patient was having  some slow response to antibiotic, so suggested continuing the antibiotic and  DVT prophylaxis. She continued for a while longer to have mucous-like stools  and abdominal pain. Gradually, she seemed to get a little bit better and she  was also followed along with some surgery who noted that she present to the  ER and diverticulitis seemingly was resolving. Discomfort was getting less  and less.  However, she had some increased abdominal pain and gradual  decreasing of her hemoglobin. As she continued to complain of pain a CT of  her abdomen showed that her diverticulitis was improving.  However, she had  developed a right middle lobe pneumonia and right lower lobe pneumonia. She  was continued on her Cipro and Flagyl and it was real strange that she  developed pneumonia while still on her antibiotics. Dr. Petra Kuba saw the  patient and he recommended we continue the antibiotics and  finished a course  of antibiotics. We did and we switched to IV Avelox 400 mg daily, and we  decided to continue for another few days on the Avelox.  It seemed that  after on the Avelox the temperature came down to 98. She was coughing up and  getting up sputum better. He had rhonchi and some right lower lobe rales.  Temperature was down to 98.0.  On April 3rd we did a chest x-ray that show  some clearing in the right lower lobe and persistent right middle lobe  pneumonia. She felt improved. She had some diarrhea. She felt better. So we  decided that we would go ahead and discharge the patient home. Continue her  on Avelox 400 mg daily for another  two weeks and Flagyl 500 mg b.i.d. for about seven more days. We put her on  guaifenesin for cough. She also received Cipro. Continue to treat her for  her reflux. We continued to put her back on her regular home medications and  we are going to see her in the office in about two weeks.      CEF/MEDQ  D:  09/05/2004  T:  09/05/2004  Job:  [id number]

## 2011-02-01 ENCOUNTER — Other Ambulatory Visit: Payer: Self-pay | Admitting: Gynecology

## 2011-02-01 DIAGNOSIS — Z9012 Acquired absence of left breast and nipple: Secondary | ICD-10-CM

## 2011-02-01 DIAGNOSIS — Z1231 Encounter for screening mammogram for malignant neoplasm of breast: Secondary | ICD-10-CM

## 2011-02-28 ENCOUNTER — Ambulatory Visit
Admission: RE | Admit: 2011-02-28 | Discharge: 2011-02-28 | Disposition: A | Discharge status: Home / Self Care | Payer: PRIVATE HEALTH INSURANCE | Source: Ambulatory Visit | Attending: Gynecology | Admitting: Gynecology

## 2011-02-28 DIAGNOSIS — Z1231 Encounter for screening mammogram for malignant neoplasm of breast: Secondary | ICD-10-CM

## 2011-02-28 DIAGNOSIS — Z9012 Acquired absence of left breast and nipple: Secondary | ICD-10-CM

## 2011-03-04 ENCOUNTER — Other Ambulatory Visit: Payer: Self-pay | Admitting: Gynecology

## 2011-03-04 DIAGNOSIS — R922 Inconclusive mammogram: Secondary | ICD-10-CM

## 2011-03-21 ENCOUNTER — Other Ambulatory Visit: Payer: PRIVATE HEALTH INSURANCE

## 2011-03-28 ENCOUNTER — Other Ambulatory Visit: Payer: PRIVATE HEALTH INSURANCE

## 2011-03-29 ENCOUNTER — Other Ambulatory Visit: Payer: Self-pay | Admitting: Internal Medicine

## 2011-03-29 DIAGNOSIS — R609 Edema, unspecified: Secondary | ICD-10-CM

## 2011-04-10 ENCOUNTER — Other Ambulatory Visit: Payer: PRIVATE HEALTH INSURANCE

## 2011-04-30 ENCOUNTER — Ambulatory Visit
Admission: RE | Admit: 2011-04-30 | Discharge: 2011-04-30 | Disposition: A | Discharge status: Home / Self Care | Payer: PRIVATE HEALTH INSURANCE | Source: Ambulatory Visit | Attending: Gynecology | Admitting: Gynecology

## 2011-04-30 DIAGNOSIS — R922 Inconclusive mammogram: Secondary | ICD-10-CM

## 2011-07-26 ENCOUNTER — Encounter (INDEPENDENT_AMBULATORY_CARE_PROVIDER_SITE_OTHER): Payer: Self-pay

## 2011-08-20 ENCOUNTER — Encounter (INDEPENDENT_AMBULATORY_CARE_PROVIDER_SITE_OTHER): Payer: Self-pay | Admitting: General Surgery

## 2011-08-20 ENCOUNTER — Ambulatory Visit (INDEPENDENT_AMBULATORY_CARE_PROVIDER_SITE_OTHER): Payer: PRIVATE HEALTH INSURANCE | Admitting: General Surgery

## 2011-08-20 DIAGNOSIS — K439 Ventral hernia without obstruction or gangrene: Secondary | ICD-10-CM

## 2011-08-20 DIAGNOSIS — R109 Unspecified abdominal pain: Secondary | ICD-10-CM

## 2011-08-20 NOTE — Progress Notes (Signed)
Patient ID: [name], female   DOB: Mar 31, 1946, 66 y.o.   MRN: [id number]  Chief Complaint  Patient presents with  . Pre-op Exam    eval ventral hernia    HPI [name] is a 66 y.o. female.  Possible peri-umbilical and RLQ hernia.  Has been a concern for the past 10 months.  Patient has noticed an abnorma shape of her abdominal wall. HPI  Past Medical History  Diagnosis Date  . Cancer   . Diverticulitis   . Fibromyalgia   . Gastritis   . GERD (gastroesophageal reflux disease)   . Depression   . CAD (coronary artery disease)   . Colon polyp   . Hemorrhoids   . Blood transfusion   . Hyperlipidemia   . Hypertension     Past Surgical History  Procedure Date  . Breast surgery   . Tonsillectomy   . Appendectomy   . Breast reconstruction   . Rotator cuff repair   . Nasal sinus surgery   . Abdominal hysterectomy   . Abdominal adhesion surgery     Family History  Problem Relation Age of Onset  . Breast cancer Sister   . Cancer Sister     breast  . Colon polyps Sister   . Alzheimer's disease Mother   . Cancer Son     prostate    Social History History  Substance Use Topics  . Smoking status: Former Games developer  . Smokeless tobacco: Former Neurosurgeon    Quit date: 08/20/1998  . Alcohol Use: No    No Known Allergies  Current Outpatient Prescriptions  Medication Sig Dispense Refill  . ibuprofen (ADVIL,MOTRIN) 200 MG tablet Take 200 mg by mouth every 6 (six) hours as needed.      . lidocaine (LIDODERM) 5 % Place 1 patch onto the skin daily. Remove & Discard patch within 12 hours or as directed by MD      . Losartan Potassium-HCTZ (HYZAAR PO) Take by mouth.      . Nitroglycerin (NITROSTAT SL) Place under the tongue.      . pantoprazole (PROTONIX) 40 MG tablet Take 40 mg by mouth daily.      . Polyethylene Glycol 3350 (MIRALAX PO) Take by mouth.      . Potassium Chloride (KLOR-CON PO) Take by mouth.      . Probiotic Product (ALIGN PO) Take by mouth.        . TRAZODONE HCL PO Take by mouth.        Review of Systems Review of Systems  Constitutional: Negative.   HENT: Negative.   Eyes: Negative.   Respiratory: Negative.   Cardiovascular: Positive for chest pain (seems more musculoskeletal).  Gastrointestinal: Negative.   Genitourinary: Negative.   Musculoskeletal: Negative.   Neurological: Negative.   Hematological: Negative.   Psychiatric/Behavioral: Negative.     Blood pressure 104/66, pulse 91, temperature 97.4 F (36.3 C), temperature source Temporal, height 5' 7.5" (1.715 m), weight 208 lb 3.2 oz (94.439 kg), SpO2 97.00%.  Physical Exam Physical Exam  Constitutional: She appears well-developed and well-nourished.  HENT:  Head: Normocephalic and atraumatic.  Eyes: Conjunctivae and EOM are normal. Pupils are equal, round, and reactive to light.  Cardiovascular: Normal rate, regular rhythm and normal heart sounds.   Pulmonary/Chest: Effort normal and breath sounds normal.  Abdominal: Soft. Bowel sounds are normal. She exhibits no distension. There is tenderness (periumbilical and RLQ) in the right lower quadrant and periumbilical area.    Musculoskeletal: Normal  range of motion.  Neurological: She is alert.    Data Reviewed Will get CT of abdomen and pelvis with contrast  Assessment    Possible abdominal wall hernia with possible diastasis recti.    Plan    CT scan of the abdomen and pelvis with contrast       Latoia Eyster III,Klair Leising O 08/20/2011, 11:20 AM

## 2011-08-23 ENCOUNTER — Ambulatory Visit
Admission: RE | Admit: 2011-08-23 | Discharge: 2011-08-23 | Disposition: A | Discharge status: Home / Self Care | Payer: PRIVATE HEALTH INSURANCE | Source: Ambulatory Visit | Attending: General Surgery | Admitting: General Surgery

## 2011-08-23 MED ORDER — IOHEXOL 300 MG/ML  SOLN
125.0000 mL | Freq: Once | INTRAMUSCULAR | Status: AC | PRN
  Administered 2011-08-23: 125 mL via INTRAVENOUS

## 2011-08-30 ENCOUNTER — Encounter (INDEPENDENT_AMBULATORY_CARE_PROVIDER_SITE_OTHER): Payer: Self-pay

## 2011-09-10 ENCOUNTER — Encounter (INDEPENDENT_AMBULATORY_CARE_PROVIDER_SITE_OTHER): Payer: Self-pay | Admitting: General Surgery

## 2011-09-10 ENCOUNTER — Ambulatory Visit (INDEPENDENT_AMBULATORY_CARE_PROVIDER_SITE_OTHER): Payer: PRIVATE HEALTH INSURANCE | Admitting: General Surgery

## 2011-09-10 DIAGNOSIS — K4090 Unilateral inguinal hernia, without obstruction or gangrene, not specified as recurrent: Secondary | ICD-10-CM | POA: Insufficient documentation

## 2011-09-10 NOTE — Progress Notes (Signed)
HPI The patient comes in continuing to complain of pain in the right lower quadrant. The CT scan of her abdomen and pelvis showed a small right inguinal hernia with some contained fat. There was no obstruction. There was no bowel contained in the hernia.  PE On examination the patient has a small bulge in the right inguinal area with Valsalva maneuver. It is mildly to moderately tender. It is easily reduced with she has no periumbilical hernia or a midline abdominal wall defect.  Studiy review I reviewed the patient's CT scan of the abdomen and pelvis which demonstrated a right inguinal hernia with a small amount of fat  Assessment Small right inguinal hernia which is reducible.  Plan Will plan an exploration and repair of right inguinal hernia as an outpatient. Likely mesh will be used.  The risks and benefits have been explained to the patient and she wishes to proceed because of the persistent discomfort in that area.

## 2011-09-20 ENCOUNTER — Encounter (HOSPITAL_COMMUNITY): Payer: Self-pay

## 2011-09-20 ENCOUNTER — Inpatient Hospital Stay (HOSPITAL_COMMUNITY): Admission: RE | Admit: 2011-09-20 | Discharge: 2011-09-20 | Payer: Medicare Other | Source: Ambulatory Visit

## 2011-09-20 NOTE — Pre-Procedure Instructions (Signed)
[address] [place] [address] [place]  09/20/2011   Your procedure is scheduled on:  09/27/2011  Report to [facility] at 5:30 AM.  Call this number if you have problems the morning of surgery: [phone]   Remember:   Do not eat food:After Midnight. 09/26/2011  May have clear liquids: up to 4 Hours before arrival.  Clear liquids include soda, tea, black coffee, apple or grape juice, broth.  Take these medicines the morning of surgery with A SIP OF WATER: PROTONIX   Do not wear jewelry, make-up or nail polish.  Do not wear lotions, powders, or perfumes. You may wear deodorant.  Do not shave 48 hours prior to surgery. Men may shave face and neck.  Do not bring valuables to the hospital.  Contacts, dentures or bridgework may not be worn into surgery.  Leave suitcase in the car. After surgery it may be brought to your room.  For patients admitted to the hospital, checkout time is 11:00 AM the day of discharge.   Patients discharged the day of surgery will not be allowed to drive home.  Name and phone number of your driver: /w family  Special Instructions: CHG Shower Use Special Wash: 1/2 bottle night before surgery and 1/2 bottle morning of surgery.   Please read over the following fact sheets that you were given: Pain Booklet, Coughing and Deep Breathing, MRSA Information and Surgical Site Infection Prevention

## 2011-09-20 NOTE — Progress Notes (Signed)
Spoke with Shonie at [facility]- requested last OV note, ekg, stress/echo if available.

## 2011-09-23 ENCOUNTER — Encounter (HOSPITAL_COMMUNITY)
Admission: RE | Admit: 2011-09-23 | Discharge: 2011-09-23 | Disposition: A | Discharge status: Home / Self Care | Payer: Medicare Other | Source: Ambulatory Visit | Attending: General Surgery | Admitting: General Surgery

## 2011-09-23 LAB — SURGICAL PCR SCREEN
MRSA, PCR: NEGATIVE
Staphylococcus aureus: POSITIVE — AB

## 2011-09-23 LAB — CBC
HCT: 40.2 % (ref 36.0–46.0)
Hemoglobin: 13 g/dL (ref 12.0–15.0)
MCHC: 32.3 g/dL (ref 30.0–36.0)
RDW: 14 % (ref 11.5–15.5)
WBC: 4.9 10*3/uL (ref 4.0–10.5)

## 2011-09-23 LAB — BASIC METABOLIC PANEL
BUN: 9 mg/dL (ref 6–23)
Chloride: 101 mEq/L (ref 96–112)
GFR calc Af Amer: 87 mL/min — ABNORMAL LOW (ref >90)
GFR calc non Af Amer: 75 mL/min — ABNORMAL LOW (ref >90)
Potassium: 3.3 mEq/L — ABNORMAL LOW (ref 3.5–5.1)

## 2011-09-26 MED ORDER — CEFAZOLIN SODIUM-DEXTROSE 2-3 GM-% IV SOLR
2.0000 g | INTRAVENOUS | Status: AC
  Administered 2011-09-27: 2 g via INTRAVENOUS
  Filled 2011-09-26: qty 50

## 2011-09-27 ENCOUNTER — Encounter (HOSPITAL_COMMUNITY): Payer: Self-pay | Admitting: Anesthesiology

## 2011-09-27 ENCOUNTER — Ambulatory Visit (HOSPITAL_COMMUNITY): Payer: Medicare Other | Admitting: Anesthesiology

## 2011-09-27 ENCOUNTER — Encounter (HOSPITAL_COMMUNITY)
Admission: RE | Disposition: A | Discharge status: Home / Self Care | Payer: Self-pay | Source: Ambulatory Visit | Attending: General Surgery

## 2011-09-27 ENCOUNTER — Ambulatory Visit (HOSPITAL_COMMUNITY)
Admission: RE | Admit: 2011-09-27 | Discharge: 2011-09-27 | Disposition: A | Discharge status: Home / Self Care | Payer: Medicare Other | Source: Ambulatory Visit | Attending: General Surgery | Admitting: General Surgery

## 2011-09-27 DIAGNOSIS — I10 Essential (primary) hypertension: Secondary | ICD-10-CM | POA: Insufficient documentation

## 2011-09-27 DIAGNOSIS — K4090 Unilateral inguinal hernia, without obstruction or gangrene, not specified as recurrent: Secondary | ICD-10-CM

## 2011-09-27 DIAGNOSIS — Z01812 Encounter for preprocedural laboratory examination: Secondary | ICD-10-CM | POA: Insufficient documentation

## 2011-09-27 DIAGNOSIS — K439 Ventral hernia without obstruction or gangrene: Secondary | ICD-10-CM

## 2011-09-27 DIAGNOSIS — IMO0001 Reserved for inherently not codable concepts without codable children: Secondary | ICD-10-CM | POA: Insufficient documentation

## 2011-09-27 DIAGNOSIS — F3289 Other specified depressive episodes: Secondary | ICD-10-CM | POA: Insufficient documentation

## 2011-09-27 DIAGNOSIS — F329 Major depressive disorder, single episode, unspecified: Secondary | ICD-10-CM | POA: Insufficient documentation

## 2011-09-27 DIAGNOSIS — K219 Gastro-esophageal reflux disease without esophagitis: Secondary | ICD-10-CM | POA: Insufficient documentation

## 2011-09-27 DIAGNOSIS — Z87891 Personal history of nicotine dependence: Secondary | ICD-10-CM | POA: Insufficient documentation

## 2011-09-27 DIAGNOSIS — I2510 Atherosclerotic heart disease of native coronary artery without angina pectoris: Secondary | ICD-10-CM | POA: Insufficient documentation

## 2011-09-27 HISTORY — DX: Lymphedema, not elsewhere classified: I89.0

## 2011-09-27 HISTORY — DX: Pneumonia, unspecified organism: J18.9

## 2011-09-27 HISTORY — PX: INGUINAL HERNIA REPAIR: SHX194

## 2011-09-27 SURGERY — REPAIR, HERNIA, INGUINAL, ADULT
Anesthesia: General | Site: Groin | Laterality: Right | Wound class: Clean

## 2011-09-27 MED ORDER — HYDROMORPHONE HCL PF 1 MG/ML IJ SOLN
0.2500 mg | INTRAMUSCULAR | Status: DC | PRN
  Administered 2011-09-27 (×4): 0.25 mg via INTRAVENOUS

## 2011-09-27 MED ORDER — GLYCOPYRROLATE 0.2 MG/ML IJ SOLN
INTRAMUSCULAR | Status: DC | PRN
  Administered 2011-09-27: .8 mg via INTRAVENOUS

## 2011-09-27 MED ORDER — HYDROCODONE-ACETAMINOPHEN 5-325 MG PO TABS
1.0000 | ORAL_TABLET | ORAL | Status: DC | PRN
  Administered 2011-09-27: 1 via ORAL

## 2011-09-27 MED ORDER — ROCURONIUM BROMIDE 100 MG/10ML IV SOLN
INTRAVENOUS | Status: DC | PRN
  Administered 2011-09-27: 40 mg via INTRAVENOUS

## 2011-09-27 MED ORDER — NEOSTIGMINE METHYLSULFATE 1 MG/ML IJ SOLN
INTRAMUSCULAR | Status: DC | PRN
  Administered 2011-09-27: 4 mg via INTRAVENOUS

## 2011-09-27 MED ORDER — LIDOCAINE HCL (CARDIAC) 20 MG/ML IV SOLN
INTRAVENOUS | Status: DC | PRN
  Administered 2011-09-27: 50 mg via INTRAVENOUS

## 2011-09-27 MED ORDER — MIDAZOLAM HCL 5 MG/5ML IJ SOLN
INTRAMUSCULAR | Status: DC | PRN
  Administered 2011-09-27: 2 mg via INTRAVENOUS

## 2011-09-27 MED ORDER — PROPOFOL 10 MG/ML IV EMUL
INTRAVENOUS | Status: DC | PRN
  Administered 2011-09-27: 170 mg via INTRAVENOUS
  Administered 2011-09-27: 30 mg via INTRAVENOUS

## 2011-09-27 MED ORDER — SODIUM CHLORIDE 0.9 % IR SOLN
Status: DC | PRN
  Administered 2011-09-27: 08:00:00

## 2011-09-27 MED ORDER — HYDROCODONE-ACETAMINOPHEN 5-500 MG PO TABS: 1.0000 | ORAL_TABLET | Freq: Four times a day (QID) | ORAL | Status: AC | PRN

## 2011-09-27 MED ORDER — ONDANSETRON HCL 4 MG/2ML IJ SOLN
INTRAMUSCULAR | Status: DC | PRN
  Administered 2011-09-27: 4 mg via INTRAVENOUS

## 2011-09-27 MED ORDER — HYDROCODONE-ACETAMINOPHEN 5-325 MG PO TABS
ORAL_TABLET | ORAL | Status: AC
  Filled 2011-09-27: qty 1

## 2011-09-27 MED ORDER — ONDANSETRON HCL 4 MG/2ML IJ SOLN: 4.0000 mg | Freq: Four times a day (QID) | INTRAMUSCULAR | Status: DC | PRN

## 2011-09-27 MED ORDER — HYDROMORPHONE HCL PF 1 MG/ML IJ SOLN
INTRAMUSCULAR | Status: AC
  Filled 2011-09-27: qty 1

## 2011-09-27 MED ORDER — LIDOCAINE HCL 1 % IJ SOLN
INTRAMUSCULAR | Status: DC | PRN
  Administered 2011-09-27: 09:00:00

## 2011-09-27 MED ORDER — FENTANYL CITRATE 0.05 MG/ML IJ SOLN
INTRAMUSCULAR | Status: DC | PRN
  Administered 2011-09-27: 100 ug via INTRAVENOUS
  Administered 2011-09-27: 50 ug via INTRAVENOUS
  Administered 2011-09-27: 25 ug via INTRAVENOUS

## 2011-09-27 MED ORDER — LACTATED RINGERS IV SOLN
INTRAVENOUS | Status: DC | PRN
  Administered 2011-09-27 (×2): via INTRAVENOUS

## 2011-09-27 SURGICAL SUPPLY — 54 items
ADH SKN CLS APL DERMABOND .7 (GAUZE/BANDAGES/DRESSINGS) ×1
BAG DECANTER FOR FLEXI CONT (MISCELLANEOUS) ×2 IMPLANT
BLADE SURG 10 STRL SS (BLADE) ×2 IMPLANT
BLADE SURG 15 STRL LF DISP TIS (BLADE) ×1 IMPLANT
BLADE SURG 15 STRL SS (BLADE) ×2
BLADE SURG ROTATE 9660 (MISCELLANEOUS) ×1 IMPLANT
CANISTER SUCTION 2500CC (MISCELLANEOUS) IMPLANT
CHLORAPREP W/TINT 26ML (MISCELLANEOUS) ×2 IMPLANT
CLEANER TIP ELECTROSURG 2X2 (MISCELLANEOUS) ×2 IMPLANT
CLOTH BEACON ORANGE TIMEOUT ST (SAFETY) ×2 IMPLANT
COVER SURGICAL LIGHT HANDLE (MISCELLANEOUS) ×2 IMPLANT
DECANTER SPIKE VIAL GLASS SM (MISCELLANEOUS) IMPLANT
DERMABOND ADVANCED (GAUZE/BANDAGES/DRESSINGS) ×1
DERMABOND ADVANCED .7 DNX12 (GAUZE/BANDAGES/DRESSINGS) ×1 IMPLANT
DRAIN PENROSE 1/2X12 LTX STRL (WOUND CARE) IMPLANT
DRAPE LAPAROTOMY TRNSV 102X78 (DRAPE) ×2 IMPLANT
DRAPE UTILITY 15X26 W/TAPE STR (DRAPE) ×4 IMPLANT
DRSG TEGADERM 4X4.75 (GAUZE/BANDAGES/DRESSINGS) ×2 IMPLANT
ELECT REM PT RETURN 9FT ADLT (ELECTROSURGICAL) ×2
ELECTRODE REM PT RTRN 9FT ADLT (ELECTROSURGICAL) ×1 IMPLANT
GAUZE SPONGE 4X4 16PLY XRAY LF (GAUZE/BANDAGES/DRESSINGS) ×2 IMPLANT
GLOVE BIOGEL PI IND STRL 7.0 (GLOVE) IMPLANT
GLOVE BIOGEL PI IND STRL 8 (GLOVE) ×1 IMPLANT
GLOVE BIOGEL PI INDICATOR 7.0 (GLOVE) ×1
GLOVE BIOGEL PI INDICATOR 8 (GLOVE) ×1
GLOVE ECLIPSE 7.5 STRL STRAW (GLOVE) ×2 IMPLANT
GLOVE SURG SS PI 7.0 STRL IVOR (GLOVE) ×1 IMPLANT
GOWN STRL NON-REIN LRG LVL3 (GOWN DISPOSABLE) ×4 IMPLANT
KIT BASIN OR (CUSTOM PROCEDURE TRAY) ×2 IMPLANT
KIT ROOM TURNOVER OR (KITS) ×2 IMPLANT
MESH HERNIA 3X6 (Mesh General) ×1 IMPLANT
NDL HYPO 25GX1X1/2 BEV (NEEDLE) ×1 IMPLANT
NEEDLE HYPO 25GX1X1/2 BEV (NEEDLE) ×2 IMPLANT
NS IRRIG 1000ML POUR BTL (IV SOLUTION) ×2 IMPLANT
PACK SURGICAL SETUP 50X90 (CUSTOM PROCEDURE TRAY) ×2 IMPLANT
PAD ARMBOARD 7.5X6 YLW CONV (MISCELLANEOUS) ×4 IMPLANT
PENCIL BUTTON HOLSTER BLD 10FT (ELECTRODE) ×2 IMPLANT
SPECIMEN JAR SMALL (MISCELLANEOUS) IMPLANT
SPONGE INTESTINAL PEANUT (DISPOSABLE) ×2 IMPLANT
SPONGE LAP 18X18 X RAY DECT (DISPOSABLE) ×2 IMPLANT
STRIP CLOSURE SKIN 1/2X4 (GAUZE/BANDAGES/DRESSINGS) ×2 IMPLANT
SUT ETHIBOND 0 MO6 C/R (SUTURE) ×2 IMPLANT
SUT MON AB 4-0 PC3 18 (SUTURE) ×2 IMPLANT
SUT PROLENE 0 CT 2 (SUTURE) ×4 IMPLANT
SUT VIC AB 3-0 SH 27 (SUTURE) ×4
SUT VIC AB 3-0 SH 27X BRD (SUTURE) ×2 IMPLANT
SUT VICRYL AB 3 0 TIES (SUTURE) ×2 IMPLANT
SYR BULB 3OZ (MISCELLANEOUS) ×2 IMPLANT
SYR CONTROL 10ML LL (SYRINGE) ×2 IMPLANT
TOWEL OR 17X24 6PK STRL BLUE (TOWEL DISPOSABLE) ×2 IMPLANT
TOWEL OR 17X26 10 PK STRL BLUE (TOWEL DISPOSABLE) ×2 IMPLANT
TUBE CONNECTING 12X1/4 (SUCTIONS) IMPLANT
WATER STERILE IRR 1000ML POUR (IV SOLUTION) IMPLANT
YANKAUER SUCT BULB TIP NO VENT (SUCTIONS) IMPLANT

## 2011-09-27 NOTE — Op Note (Signed)
OPERATIVE REPORT  DATE OF OPERATION: 09/27/2011  PATIENT:  [name]  66 y.o. female  PRE-OPERATIVE DIAGNOSIS:  symptomatic right inguinal hernia  POST-OPERATIVE DIAGNOSIS:  symptomatic right inguinal hernia, indirect and direct  PROCEDURE:  Procedure(s): HERNIA REPAIR INGUINAL ADULT INSERTION OF MESH  SURGEON:  Surgeon(s): Cherylynn Ridges, MD  ASSISTANT: None  ANESTHESIA:   general  EBL: <30 ml  BLOOD ADMINISTERED: none  DRAINS: none   SPECIMEN:  No Specimen  COUNTS CORRECT:  YES  PROCEDURE DETAILS: The patient was taken to the operating room and placed on the table in the supine position. After an adequate endotracheal anesthetic was administered she was prepped and draped in usual sterile manner on the correct side which her was her right side that had been marked preoperatively.  After proper time out was performed identifying the patient and the procedure to be performed a right inguinal transverse incision approximately 6-7 cm long was made using a #10 blade. This was at the right lateral aspect of a previous abdominoplasty incision. This was taken down into the subcutaneous tissue then down to the external oblique fascia using electrocautery. Care was used to obtain adequate hemostasis. We dissected out the inferior aspect of the external oblique fascia and could see the hernia bulging out through the superficial ring. We opened up the fascia along the lines of the fascial lines down through the superficial ring. We able to mobilize the round ligament using the surgeon's fingers. We detached the round ligament from the distal aspect and then dissected out the round ligament to the internal ring.   The patient had a direct and indirect sac. The indirect sac was taken all the way down to the) with the deep ring and at that point suture ligated with an 0 Ethibond suture x2. We excise ligament and the extra excess sac. Once this was done we had to repair of the floor with  a direct defect was using an oval piece of mesh measuring approximately 3 x 6 cm in size attaching it to the reflected portion of the inguinal ligament inferolaterally and Cooper's ligament, and the conjoined tendon anterior medially using running 0 Prolene sutures. The mesh had been cut to the appropriate size and soaked in antibiotic solution. Once it was in place we irrigated with antibiotic solution.   We then closed the external oblique fascia on top of the mesh using running 3-0 Vicryl suture. Scarpa's fascia was then reapproximated using 3-0 Vicryl interrupted sutures. Cortisone Marcaine and half percent lidocaine were used to inject into subcutaneous tissue and also at the anterior superior iliac spine for postop anesthesia. Then closed the skin using running subcuticular stitch of 4-0 Monocryl. Dermabond Steri-Strips and Tegaderms use complete the dressings. All needle counts sponge counts and instrument counts were correct. This is Dr. Lindie Spruce in a report. Thank you  PATIENT DISPOSITION:  PACU - hemodynamically stable.   Emaleigh Guimond III,Derryck Shahan O 6/7/20139:31 AM

## 2011-09-27 NOTE — H&P (View-Only) (Signed)
HPI The patient comes in continuing to complain of pain in the right lower quadrant. The CT scan of her abdomen and pelvis showed a small right inguinal hernia with some contained fat. There was no obstruction. There was no bowel contained in the hernia.  PE On examination the patient has a small bulge in the right inguinal area with Valsalva maneuver. It is mildly to moderately tender. It is easily reduced with she has no periumbilical hernia or a midline abdominal wall defect.  Studiy review I reviewed the patient's CT scan of the abdomen and pelvis which demonstrated a right inguinal hernia with a small amount of fat  Assessment Small right inguinal hernia which is reducible.  Plan Will plan an exploration and repair of right inguinal hernia as an outpatient. Likely mesh will be used.  The risks and benefits have been explained to the patient and she wishes to proceed because of the persistent discomfort in that area. 

## 2011-09-27 NOTE — Anesthesia Procedure Notes (Signed)
Procedure Name: Intubation Date/Time: 09/27/2011 7:59 AM Performed by: Garen Lah Pre-anesthesia Checklist: Patient identified, Timeout performed, Emergency Drugs available, Suction available and Patient being monitored Patient Re-evaluated:Patient Re-evaluated prior to inductionOxygen Delivery Method: Circle system utilized Preoxygenation: Pre-oxygenation with 100% oxygen Intubation Type: IV induction Ventilation: Mask ventilation without difficulty and Oral airway inserted - appropriate to patient size Laryngoscope Size: Mac and 3 Grade View: Grade I Tube type: Oral Tube size: 7.5 mm Number of attempts: 1 Airway Equipment and Method: Stylet Placement Confirmation: ETT inserted through vocal cords under direct vision,  positive ETCO2 and breath sounds checked- equal and bilateral Secured at: 21 cm Tube secured with: Tape Dental Injury: Teeth and Oropharynx as per pre-operative assessment

## 2011-09-27 NOTE — Transfer of Care (Signed)
Immediate Anesthesia Transfer of Care Note  Patient: [name]  Procedure(s) Performed: Procedure(s) (LRB): HERNIA REPAIR INGUINAL ADULT (Right) INSERTION OF MESH (Right)  Patient Location: PACU  Anesthesia Type: General  Level of Consciousness: awake and alert   Airway & Oxygen Therapy: Patient Spontanous Breathing and Patient connected to nasal cannula oxygen  Post-op Assessment: Report given to PACU RN and Post -op Vital signs reviewed and stable  Post vital signs: Reviewed and stable  Complications: No apparent anesthesia complications

## 2011-09-27 NOTE — Discharge Instructions (Signed)
Hernia Repair Care After These instructions give you information on caring for yourself after your procedure. Your doctor may also give you more specific instructions. Call your doctor if you have any problems or questions after your procedure. HOME CARE   You may have changes in your poops (bowel movements).   You may have loose or watery poop (diarrhea).   You may be not able to poop.   Your bowels will slowly get back to normal.   Do not eat any food that makes you sick to your stomach (nauseous). Eat small meals 4 to 6 times a day instead of 3 large ones.   Do not drink pop. It will give you gas.   Do not drink alcohol.   Do not lift anything heavier than 10 pounds. This is about the weight of a gallon of milk.   Do not do anything that makes you very tired for at least 6 weeks.   Do not get your wound wet for 2 days.   You may take a sponge bath during this time.   After 2 days you may take a shower. Gently pat your surgical cut (incision) dry with a towel. Do not rub it.   For men: You may have been given an athletic supporter (scrotal support) before you left the hospital. It holds your scrotum and testicles closer to your body so there is no strain on your wound. Wear the supporter until your doctor tells you that you do not need it anymore.  GET HELP RIGHT AWAY IF:  You have watery poop, or cannot poop for more than 3 days.   You feel sick to your stomach or throw up (vomit) more than 2 or 3 times.   You have temperature by mouth above 102 F (38.9 C).   You see redness or puffiness (swelling) around your wound.   You see yellowish white fluid (pus) coming from your wound.   You see a bulge or bump in your lower belly (abdomen) or near your groin.   You develop a rash, trouble breathing, or any other symptoms from medicines taken.  MAKE SURE YOU:  Understand these instructions.   Will watch your condition.   Will get help right away if your are not doing  well or get worse.  Document Released: 03/21/2008 Document Revised: 03/28/2011 Document Reviewed: 03/21/2008 ExitCare Patient Information 2012 [employer]. 

## 2011-09-27 NOTE — Anesthesia Preprocedure Evaluation (Addendum)
Anesthesia Evaluation  Patient identified by MRN, date of birth, ID band Patient awake    Reviewed: Allergy & Precautions, H&P , NPO status , Patient's Chart, lab work & pertinent test results, reviewed documented beta blocker date and time   Airway Mallampati: II  Neck ROM: full    Dental  (+) Edentulous Upper, Dental Advisory Given and Partial Lower   Pulmonary pneumonia , former smoker         Cardiovascular hypertension, Pt. on medications + CAD     Neuro/Psych PSYCHIATRIC DISORDERS Depression  Neuromuscular disease    GI/Hepatic GERD-  Medicated and Controlled,  Endo/Other  obese  Renal/GU      Musculoskeletal  (+) Fibromyalgia -  Abdominal (+) + obese,   Peds  Hematology   Anesthesia Other Findings   Reproductive/Obstetrics                          Anesthesia Physical Anesthesia Plan  ASA: III  Anesthesia Plan: General   Post-op Pain Management:    Induction: Intravenous  Airway Management Planned: Oral ETT  Additional Equipment:   Intra-op Plan:   Post-operative Plan: Extubation in OR  Informed Consent: I have reviewed the patients History and Physical, chart, labs and discussed the procedure including the risks, benefits and alternatives for the proposed anesthesia with the patient or authorized representative who has indicated his/her understanding and acceptance.   Dental advisory given  Plan Discussed with: CRNA and Surgeon  Anesthesia Plan Comments:        Anesthesia Quick Evaluation

## 2011-09-27 NOTE — Interval H&P Note (Signed)
History and Physical Interval Note:  09/27/2011 7:46 AM  [name]  has presented today for surgery, with the diagnosis of symptomatic right inguinal hernia  The various methods of treatment have been discussed with the patient and family. After consideration of risks, benefits and other options for treatment, the patient has consented to  Procedure(s) (LRB): HERNIA REPAIR INGUINAL ADULT (Right) INSERTION OF MESH (Right) as a surgical intervention .  The patients' history has been reviewed, patient examined, no change in status, stable for surgery.  I have reviewed the patients' chart and labs.  Questions were answered to the patient's satisfaction.     Rajeev Escue III,Deliliah Spranger O

## 2011-09-27 NOTE — Anesthesia Postprocedure Evaluation (Signed)
Anesthesia Post Note  Patient: [name]  Procedure(s) Performed: Procedure(s) (LRB): HERNIA REPAIR INGUINAL ADULT (Right) INSERTION OF MESH (Right)  Anesthesia type: General  Patient location: PACU  Post pain: Pain level controlled and Adequate analgesia  Post assessment: Post-op Vital signs reviewed, Patient's Cardiovascular Status Stable, Respiratory Function Stable, Patent Airway and Pain level controlled  Last Vitals:  Filed Vitals:   09/27/11 0930  BP:   Pulse:   Temp: 36.2 C  Resp:     Post vital signs: Reviewed and stable  Level of consciousness: awake, alert  and oriented  Complications: No apparent anesthesia complications

## 2011-09-27 NOTE — Preoperative (Signed)
Beta Blockers   Reason not to administer Beta Blockers:Not Applicable. No home beta blockers 

## 2011-09-30 ENCOUNTER — Encounter (HOSPITAL_COMMUNITY): Payer: Self-pay | Admitting: General Surgery

## 2011-10-08 ENCOUNTER — Ambulatory Visit (INDEPENDENT_AMBULATORY_CARE_PROVIDER_SITE_OTHER): Payer: Medicare Other | Admitting: General Surgery

## 2011-10-08 ENCOUNTER — Encounter (INDEPENDENT_AMBULATORY_CARE_PROVIDER_SITE_OTHER): Payer: Self-pay | Admitting: General Surgery

## 2011-10-08 VITALS — BP 104/70 | HR 86 | Temp 97.2°F | Resp 14 | Ht 68.0 in | Wt 206.8 lb

## 2011-10-08 DIAGNOSIS — Z09 Encounter for follow-up examination after completed treatment for conditions other than malignant neoplasm: Secondary | ICD-10-CM | POA: Insufficient documentation

## 2011-10-08 NOTE — Progress Notes (Signed)
HPI The patient is status post right inguinal hernia repair. At the time of her surgery she had an indirect and direct hernia. Her current postoperative complaints aren't intermittent pain especially with coughing and pressure in that area. She has noticed no recurrence.  PE On examination her wound is well with no evidence of infection. There was some mild to moderate swelling in the right inguinal region but no evidence of recurrent hernia.  Studiy review None.  Assessment  Wall status post open right inguinal hernia repair.  Plan The patient returned to see me on a p.r.n. basis.

## 2012-02-10 ENCOUNTER — Other Ambulatory Visit: Payer: Self-pay | Admitting: Gynecology

## 2012-02-12 ENCOUNTER — Other Ambulatory Visit: Payer: Self-pay | Admitting: Gynecology

## 2012-02-12 DIAGNOSIS — N63 Unspecified lump in unspecified breast: Secondary | ICD-10-CM

## 2012-02-17 ENCOUNTER — Ambulatory Visit
Admission: RE | Admit: 2012-02-17 | Discharge: 2012-02-17 | Disposition: A | Discharge status: Home / Self Care | Payer: Medicare Other | Source: Ambulatory Visit | Attending: Gynecology | Admitting: Gynecology

## 2012-02-17 DIAGNOSIS — N63 Unspecified lump in unspecified breast: Secondary | ICD-10-CM

## 2012-05-04 ENCOUNTER — Other Ambulatory Visit: Payer: Self-pay | Admitting: Gynecology

## 2012-05-04 DIAGNOSIS — Z1231 Encounter for screening mammogram for malignant neoplasm of breast: Secondary | ICD-10-CM

## 2012-06-08 ENCOUNTER — Other Ambulatory Visit: Payer: Self-pay | Admitting: Gynecology

## 2012-06-08 ENCOUNTER — Ambulatory Visit
Admission: RE | Admit: 2012-06-08 | Discharge: 2012-06-08 | Disposition: A | Discharge status: Home / Self Care | Payer: PRIVATE HEALTH INSURANCE | Source: Ambulatory Visit | Attending: Gynecology | Admitting: Gynecology

## 2012-06-08 DIAGNOSIS — R928 Other abnormal and inconclusive findings on diagnostic imaging of breast: Secondary | ICD-10-CM

## 2012-06-08 DIAGNOSIS — Z1231 Encounter for screening mammogram for malignant neoplasm of breast: Secondary | ICD-10-CM

## 2012-06-18 ENCOUNTER — Ambulatory Visit
Admission: RE | Admit: 2012-06-18 | Discharge: 2012-06-18 | Disposition: A | Discharge status: Home / Self Care | Payer: PRIVATE HEALTH INSURANCE | Source: Ambulatory Visit | Attending: Gynecology | Admitting: Gynecology

## 2012-06-18 DIAGNOSIS — R928 Other abnormal and inconclusive findings on diagnostic imaging of breast: Secondary | ICD-10-CM

## 2012-07-03 LAB — HM COLONOSCOPY

## 2012-08-31 DIAGNOSIS — R7309 Other abnormal glucose: Secondary | ICD-10-CM | POA: Diagnosis not present

## 2012-08-31 DIAGNOSIS — Z79899 Other long term (current) drug therapy: Secondary | ICD-10-CM | POA: Diagnosis not present

## 2012-08-31 DIAGNOSIS — I10 Essential (primary) hypertension: Secondary | ICD-10-CM | POA: Diagnosis not present

## 2012-08-31 DIAGNOSIS — E785 Hyperlipidemia, unspecified: Secondary | ICD-10-CM | POA: Diagnosis not present

## 2012-10-06 DIAGNOSIS — J019 Acute sinusitis, unspecified: Secondary | ICD-10-CM | POA: Diagnosis not present

## 2012-10-06 DIAGNOSIS — I10 Essential (primary) hypertension: Secondary | ICD-10-CM | POA: Diagnosis not present

## 2012-10-06 DIAGNOSIS — R42 Dizziness and giddiness: Secondary | ICD-10-CM | POA: Diagnosis not present

## 2012-10-06 DIAGNOSIS — Z79899 Other long term (current) drug therapy: Secondary | ICD-10-CM | POA: Diagnosis not present

## 2012-10-19 ENCOUNTER — Telehealth: Payer: Self-pay | Admitting: Oncology

## 2012-10-19 NOTE — Telephone Encounter (Signed)
Faxed pt medical records to [employer] °

## 2012-10-29 ENCOUNTER — Other Ambulatory Visit: Payer: Self-pay | Admitting: Internal Medicine

## 2012-10-29 DIAGNOSIS — R42 Dizziness and giddiness: Secondary | ICD-10-CM

## 2012-11-03 DIAGNOSIS — R21 Rash and other nonspecific skin eruption: Secondary | ICD-10-CM | POA: Diagnosis not present

## 2012-11-03 DIAGNOSIS — Z79899 Other long term (current) drug therapy: Secondary | ICD-10-CM | POA: Diagnosis not present

## 2012-11-03 DIAGNOSIS — I10 Essential (primary) hypertension: Secondary | ICD-10-CM | POA: Diagnosis not present

## 2013-02-23 ENCOUNTER — Ambulatory Visit (INDEPENDENT_AMBULATORY_CARE_PROVIDER_SITE_OTHER): Payer: Medicare Other | Admitting: Gynecology

## 2013-02-23 ENCOUNTER — Encounter: Payer: Self-pay | Admitting: Gynecology

## 2013-02-23 VITALS — BP 120/74 | Ht 68.0 in | Wt 207.0 lb

## 2013-02-23 DIAGNOSIS — N952 Postmenopausal atrophic vaginitis: Secondary | ICD-10-CM

## 2013-02-23 DIAGNOSIS — L989 Disorder of the skin and subcutaneous tissue, unspecified: Secondary | ICD-10-CM | POA: Diagnosis not present

## 2013-02-23 DIAGNOSIS — Z23 Encounter for immunization: Secondary | ICD-10-CM

## 2013-02-23 NOTE — Addendum Note (Signed)
Addended by: Dayna Barker on: 02/23/2013 11:26 AM   Modules accepted: Orders

## 2013-02-23 NOTE — Progress Notes (Signed)
[name] [birth year] [id number]        67 y.o.  G5P5 new patient for followup exam.  Former patient of Dr. Nicholas Lose with several issues noted below.  Past medical history,surgical history, problem list, medications, allergies, family history and social history were all reviewed and documented in the EPIC chart.  ROS:  Performed and pertinent positives and negatives are included in the history, assessment and plan .  Exam: Kim assistant Filed Vitals:   02/23/13 1007  BP: 120/74  Height: 5\' 8"  (1.727 m)  Weight: 207 lb (93.895 kg)   General appearance  Normal Skin grossly normal Head/Neck normal with no cervical or supraclavicular adenopathy thyroid normal Lungs  clear Cardiac RR, without RMG Abdominal  soft, nontender, without masses, organomegaly or hernia Right breast examined lying and sitting without masses, retractions, discharge or axillary adenopathy. Left breast status post reconstruction without masses, acute skin changes or axillary adenopathy. Below left breast nodular lesion 8 mm at junction with rib cage. Appears to be fibrodermal type cyst. Pelvic  Ext/BUS/vagina  atrophic changes with shortened vaginal length.  Adnexa  Without masses or tenderness    Anus and perineum  normal   Rectovaginal  normal sphincter tone without palpated masses or tenderness.    Assessment/Plan:  67 y.o. G5P5 new patient for followup exam.   1. History of TAH for leiomyoma and bleeding. Subsequent vaginal surgery for prolapse to include an anterior-posterior enterocele repair 2010. Vagina is shortened which at this point patient is asymptomatic from it she is not sexually active. May be an issue if she does become sexually active that she is recently widowed. Reviewed this with her the possibility of dilatation. Patient will followup if she becomes sexually active and will discuss dilatation at that time and possible addition of vaginal estrogen. At this point she is not having any issues  with vaginal dryness, hot flashes or night sweats. 2. Skin lesion below left breast. Appears to be a fibro dermal type skin lesion.  Recommended patient followup with dermatologist to examine and possibly excise and patient agrees to do so as she does have a dermatologist. 3. Pap smear 2012. No Pap smear done today. No history of abnormal Pap smears previously. Options to stop screening issues over the age of 34 and status post hysterectomy for benign indications reviewed versus less frequent screening intervals. Will readdress on an annual basis. 4. Mammography 05/2012. History of left breast mastectomy with followup chemotherapy and radiation in 2000 for breast cancer. Took tamoxifen for 5 years historically. Continue with annual mammography and SBE monthly reviewed. 5. DEXA 2009 normal. With previous normal Dexa's. Recommend repeat at age 7. Increase calcium vitamin D reviewed. 6. Colonoscopy 2013. Repeat at their recommended interval. 7. Health maintenance. No blood work done as this is all done through her primary physician's office. Followup one year, sooner as needed.  Note: This document was prepared with digital dictation and possible smart phrase technology. Any transcriptional errors that result from this process are unintentional.   Dara Lords MD, 10:58 AM 02/23/2013

## 2013-02-23 NOTE — Patient Instructions (Addendum)
Followup with the dermatologist to consider having the area biopsied below your left breast. Followup in one year for annual exam, sooner if any issues.

## 2013-04-22 HISTORY — PX: CATARACT EXTRACTION W/ INTRAOCULAR LENS  IMPLANT, BILATERAL: SHX1307

## 2013-05-06 DIAGNOSIS — E785 Hyperlipidemia, unspecified: Secondary | ICD-10-CM | POA: Diagnosis not present

## 2013-05-06 DIAGNOSIS — I10 Essential (primary) hypertension: Secondary | ICD-10-CM | POA: Diagnosis not present

## 2013-05-06 DIAGNOSIS — Z79899 Other long term (current) drug therapy: Secondary | ICD-10-CM | POA: Diagnosis not present

## 2013-05-20 ENCOUNTER — Other Ambulatory Visit: Payer: Self-pay | Admitting: Gynecology

## 2013-05-20 DIAGNOSIS — N6310 Unspecified lump in the right breast, unspecified quadrant: Secondary | ICD-10-CM

## 2013-06-07 ENCOUNTER — Other Ambulatory Visit: Payer: Self-pay | Admitting: Gynecology

## 2013-06-07 ENCOUNTER — Other Ambulatory Visit: Payer: Self-pay

## 2013-06-07 DIAGNOSIS — N6310 Unspecified lump in the right breast, unspecified quadrant: Secondary | ICD-10-CM

## 2013-06-08 IMAGING — MG MM DIGITAL DIAGNOSTIC UNILAT*L*
2 series · 2 of 2 positions shown · non-contrast
Comparison: With priors

CLINICAL DATA: The patient describes a somewhat painful sensation
in the left axilla at times.  She palpates some fullness in this
region.  She complains of left shoulder pain. She had a left
axillary lymph node dissection and left mastectomy in 6777.

DIGITAL DIAGNOSTIC LEFT MAMMOGRAM WITH CAD AND LEFT BREAST
ULTRASOUND:

[L MLO]
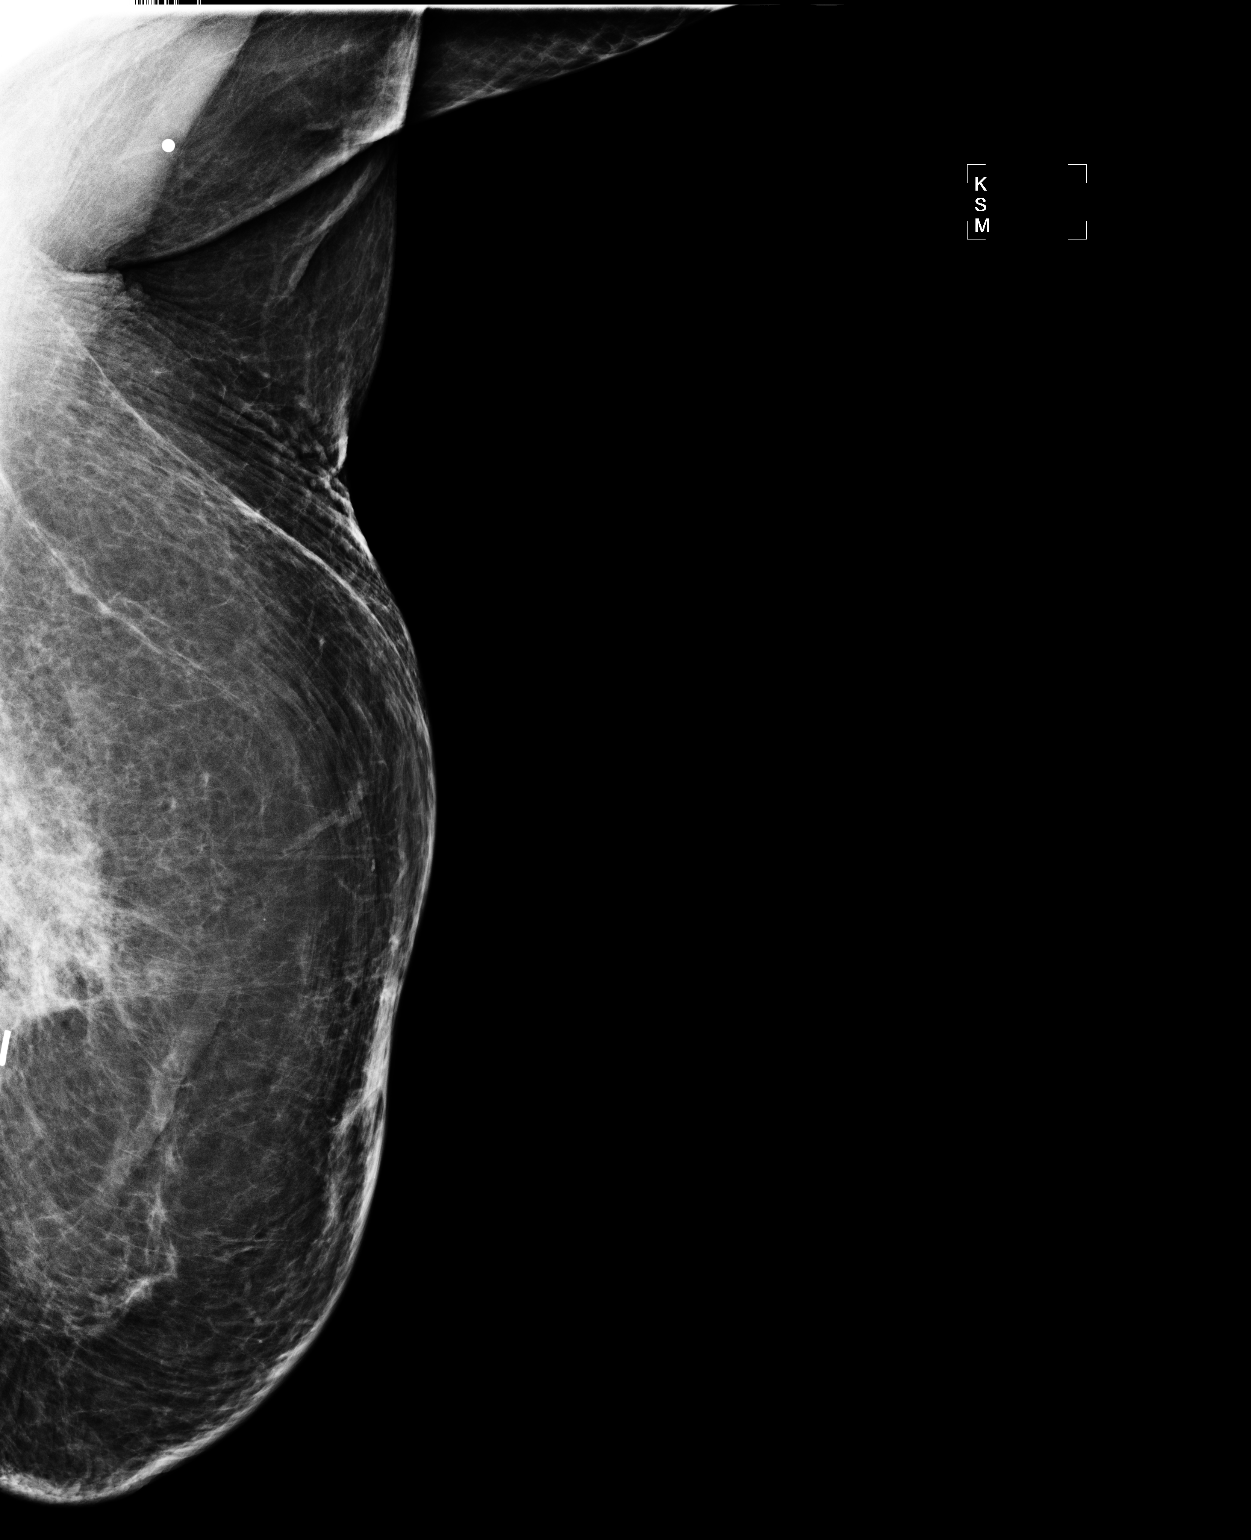

[L TAN]
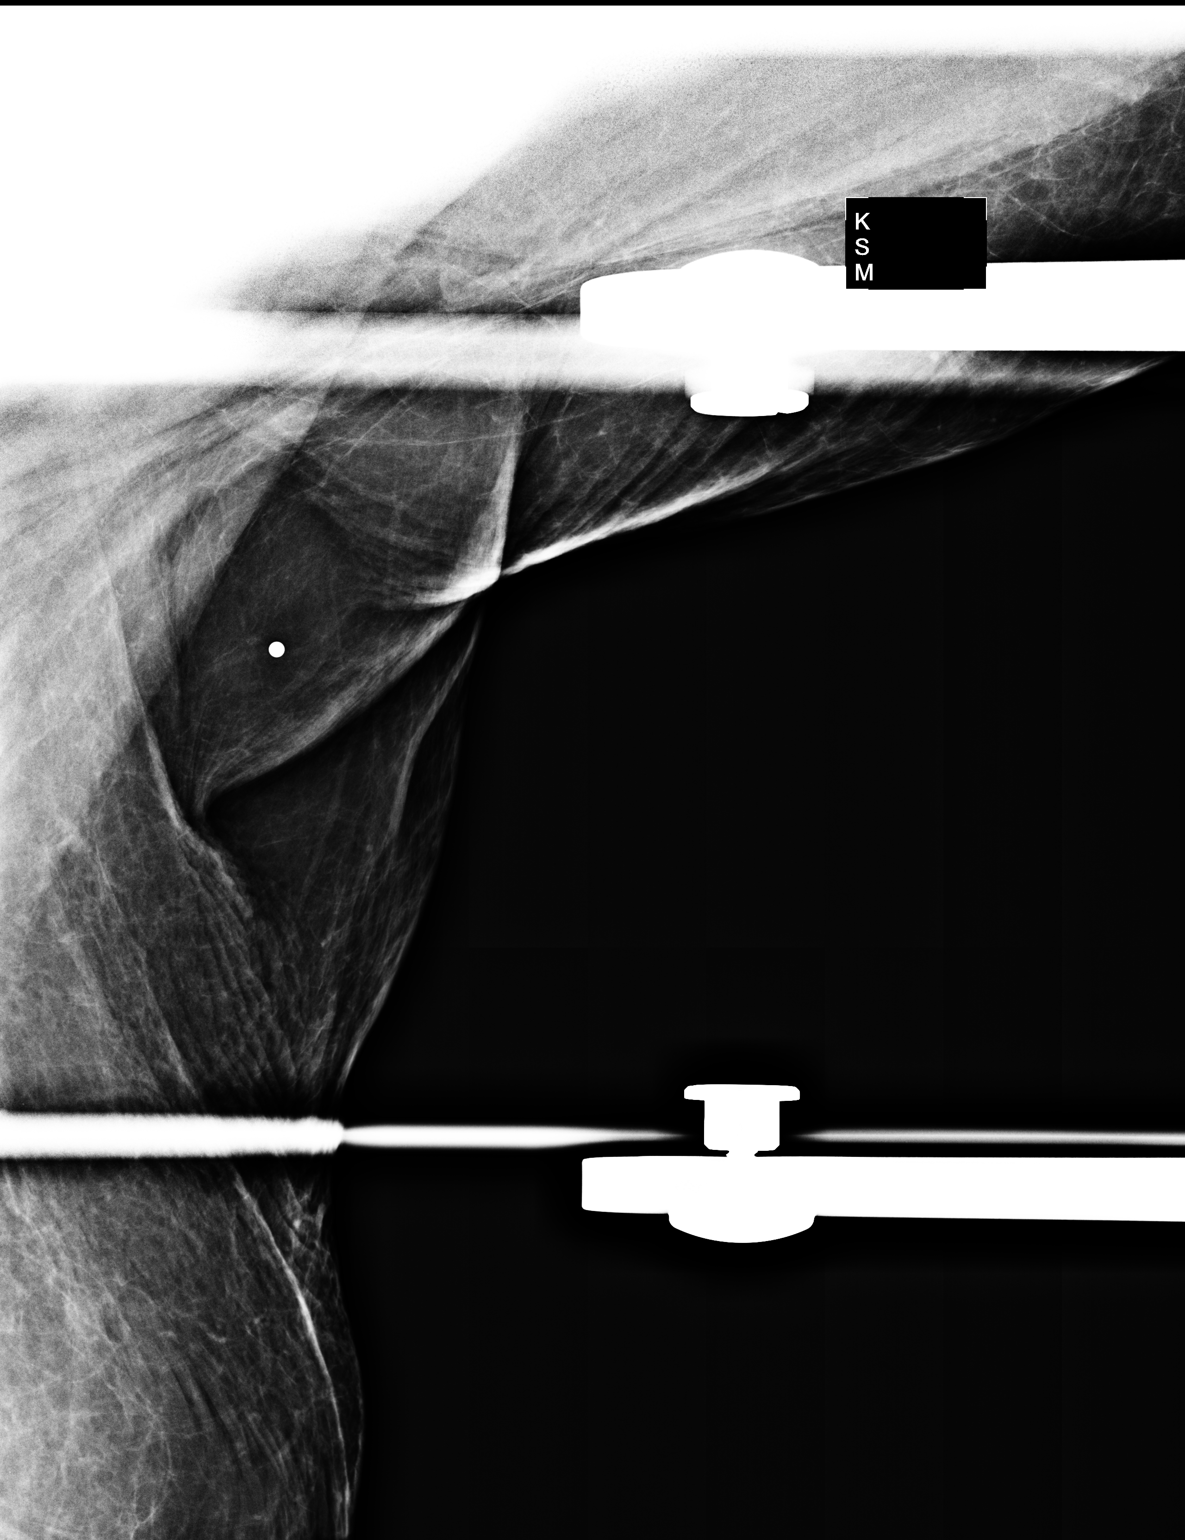

[2 of 2 positions shown; findings below may reference images not displayed]

FINDINGS: A 90 degrees lateral view of the left TRAM is negative.
A spot compression view of the left axilla in the region of patient
concern shows normal appearing subcutaneous fat.  No axillary lymph
nodes are visualized.

Mammographic images were processed with CAD.

On physical exam, no discrete mass is palpated in the left axilla.

Ultrasound is performed, showing normal subcutaneous fat and
muscle.  No lymph nodes are detected by ultrasound.  Specifically,
no lymphadenopathy is seen.  There is no solid or cystic mass.
IMPRESSION: No evidence of malignancy in the left TRAM or left axilla.

RECOMMENDATION:
The patient is due for her annual screening mammogram in February 2012.

I have discussed the findings and recommendations with the patient.
Results were also provided in writing at the conclusion of the
visit.

BI-RADS CATEGORY 2:  Benign finding(s).

## 2013-06-08 IMAGING — US US BREAST*L*
1 series · 1 of 1 positions shown · non-contrast
Comparison: With priors

CLINICAL DATA: The patient describes a somewhat painful sensation
in the left axilla at times.  She palpates some fullness in this
region.  She complains of left shoulder pain. She had a left
axillary lymph node dissection and left mastectomy in 6777.

DIGITAL DIAGNOSTIC LEFT MAMMOGRAM WITH CAD AND LEFT BREAST
ULTRASOUND:

[Series 1: us breast*left* · 1 of 1 slices shown]
[im 1/1]
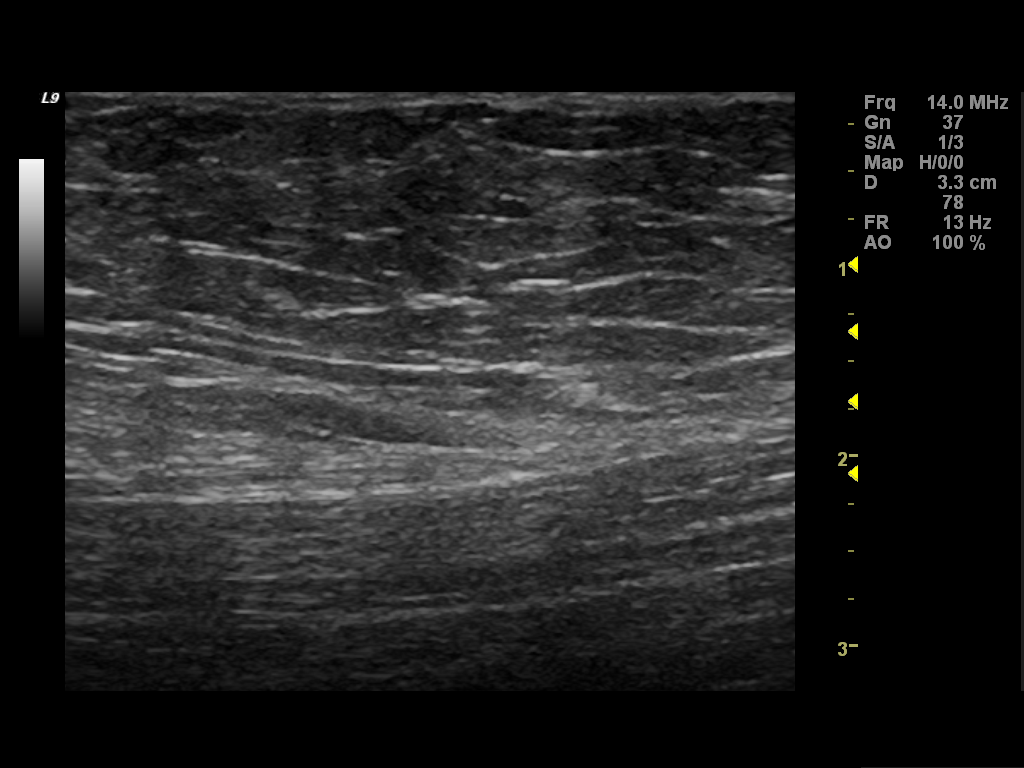

[1 of 1 positions shown; findings below may reference images not displayed]

FINDINGS: A 90 degrees lateral view of the left TRAM is negative.
A spot compression view of the left axilla in the region of patient
concern shows normal appearing subcutaneous fat.  No axillary lymph
nodes are visualized.

Mammographic images were processed with CAD.

On physical exam, no discrete mass is palpated in the left axilla.

Ultrasound is performed, showing normal subcutaneous fat and
muscle.  No lymph nodes are detected by ultrasound.  Specifically,
no lymphadenopathy is seen.  There is no solid or cystic mass.
IMPRESSION: No evidence of malignancy in the left TRAM or left axilla.

RECOMMENDATION:
The patient is due for her annual screening mammogram in February 2012.

I have discussed the findings and recommendations with the patient.
Results were also provided in writing at the conclusion of the
visit.

BI-RADS CATEGORY 2:  Benign finding(s).

## 2013-06-11 ENCOUNTER — Ambulatory Visit
Admission: RE | Admit: 2013-06-11 | Discharge: 2013-06-11 | Disposition: A | Discharge status: Home / Self Care | Payer: Medicare Other | Source: Ambulatory Visit | Attending: Gynecology | Admitting: Gynecology

## 2013-06-11 DIAGNOSIS — H04129 Dry eye syndrome of unspecified lacrimal gland: Secondary | ICD-10-CM | POA: Diagnosis not present

## 2013-06-11 DIAGNOSIS — N6310 Unspecified lump in the right breast, unspecified quadrant: Secondary | ICD-10-CM

## 2013-06-11 DIAGNOSIS — R928 Other abnormal and inconclusive findings on diagnostic imaging of breast: Secondary | ICD-10-CM | POA: Diagnosis not present

## 2013-06-11 DIAGNOSIS — H1045 Other chronic allergic conjunctivitis: Secondary | ICD-10-CM | POA: Diagnosis not present

## 2013-06-11 DIAGNOSIS — H2589 Other age-related cataract: Secondary | ICD-10-CM | POA: Diagnosis not present

## 2013-06-11 DIAGNOSIS — H43819 Vitreous degeneration, unspecified eye: Secondary | ICD-10-CM | POA: Diagnosis not present

## 2013-06-21 DIAGNOSIS — H25019 Cortical age-related cataract, unspecified eye: Secondary | ICD-10-CM | POA: Diagnosis not present

## 2013-06-21 DIAGNOSIS — H25049 Posterior subcapsular polar age-related cataract, unspecified eye: Secondary | ICD-10-CM | POA: Diagnosis not present

## 2013-06-21 DIAGNOSIS — H2589 Other age-related cataract: Secondary | ICD-10-CM | POA: Diagnosis not present

## 2013-06-21 DIAGNOSIS — H2510 Age-related nuclear cataract, unspecified eye: Secondary | ICD-10-CM | POA: Diagnosis not present

## 2013-06-21 DIAGNOSIS — H5700 Unspecified anomaly of pupillary function: Secondary | ICD-10-CM | POA: Diagnosis not present

## 2013-07-05 DIAGNOSIS — H2589 Other age-related cataract: Secondary | ICD-10-CM | POA: Diagnosis not present

## 2013-07-12 DIAGNOSIS — H25049 Posterior subcapsular polar age-related cataract, unspecified eye: Secondary | ICD-10-CM | POA: Diagnosis not present

## 2013-07-12 DIAGNOSIS — H25019 Cortical age-related cataract, unspecified eye: Secondary | ICD-10-CM | POA: Diagnosis not present

## 2013-07-12 DIAGNOSIS — H2589 Other age-related cataract: Secondary | ICD-10-CM | POA: Diagnosis not present

## 2013-07-12 DIAGNOSIS — H2510 Age-related nuclear cataract, unspecified eye: Secondary | ICD-10-CM | POA: Diagnosis not present

## 2013-11-30 ENCOUNTER — Telehealth: Payer: Self-pay | Admitting: *Deleted

## 2013-11-30 NOTE — Telephone Encounter (Signed)
Pt called requesting refill on Hyzaar 100-25mg  has new PCP and appointment is not until 12/17/13. I explained to pt that maybe the could fit her in for a consult appointment with new PCP to discuss medication. Pt will be out of medication on Sunday. Pt will call me back and let me know what PCP office said.

## 2013-12-03 ENCOUNTER — Other Ambulatory Visit: Payer: Self-pay

## 2013-12-03 ENCOUNTER — Encounter: Payer: Self-pay | Admitting: Family Medicine

## 2013-12-03 NOTE — Telephone Encounter (Signed)
Pt has new pt appt with Dr Deborra Medina on 12/14/13 and is going to be out of losartan HCTZ next week. Dr Roland Earl office has not refilled after multiple request. Pt wants to know if can get enough pills to last until seen 12/14/13. Pt request cb. Pt is aware will get cb next week.

## 2013-12-06 MED ORDER — LOSARTAN POTASSIUM-HCTZ 100-25 MG PO TABS: 1.0000 | ORAL_TABLET | Freq: Every day | ORAL | Status: DC

## 2013-12-14 ENCOUNTER — Ambulatory Visit (INDEPENDENT_AMBULATORY_CARE_PROVIDER_SITE_OTHER)
Admission: RE | Admit: 2013-12-14 | Discharge: 2013-12-14 | Disposition: A | Discharge status: Home / Self Care | Payer: Medicare Other | Source: Ambulatory Visit | Attending: Family Medicine | Admitting: Family Medicine

## 2013-12-14 ENCOUNTER — Encounter (INDEPENDENT_AMBULATORY_CARE_PROVIDER_SITE_OTHER): Payer: Self-pay

## 2013-12-14 ENCOUNTER — Encounter: Payer: Self-pay | Admitting: Family Medicine

## 2013-12-14 ENCOUNTER — Ambulatory Visit (INDEPENDENT_AMBULATORY_CARE_PROVIDER_SITE_OTHER): Payer: Medicare Other | Admitting: Family Medicine

## 2013-12-14 ENCOUNTER — Ambulatory Visit: Payer: Medicare Other | Admitting: Family Medicine

## 2013-12-14 VITALS — BP 116/70 | HR 57 | Temp 97.5°F | Ht 68.0 in | Wt 207.0 lb

## 2013-12-14 DIAGNOSIS — F3289 Other specified depressive episodes: Secondary | ICD-10-CM

## 2013-12-14 DIAGNOSIS — F329 Major depressive disorder, single episode, unspecified: Secondary | ICD-10-CM

## 2013-12-14 DIAGNOSIS — I10 Essential (primary) hypertension: Secondary | ICD-10-CM | POA: Diagnosis not present

## 2013-12-14 DIAGNOSIS — E782 Mixed hyperlipidemia: Secondary | ICD-10-CM | POA: Insufficient documentation

## 2013-12-14 DIAGNOSIS — E785 Hyperlipidemia, unspecified: Secondary | ICD-10-CM | POA: Diagnosis not present

## 2013-12-14 DIAGNOSIS — R609 Edema, unspecified: Secondary | ICD-10-CM | POA: Diagnosis not present

## 2013-12-14 DIAGNOSIS — IMO0001 Reserved for inherently not codable concepts without codable children: Secondary | ICD-10-CM

## 2013-12-14 DIAGNOSIS — Z853 Personal history of malignant neoplasm of breast: Secondary | ICD-10-CM | POA: Insufficient documentation

## 2013-12-14 DIAGNOSIS — I2510 Atherosclerotic heart disease of native coronary artery without angina pectoris: Secondary | ICD-10-CM | POA: Insufficient documentation

## 2013-12-14 DIAGNOSIS — M25559 Pain in unspecified hip: Secondary | ICD-10-CM | POA: Diagnosis not present

## 2013-12-14 DIAGNOSIS — M25552 Pain in left hip: Secondary | ICD-10-CM

## 2013-12-14 DIAGNOSIS — K219 Gastro-esophageal reflux disease without esophagitis: Secondary | ICD-10-CM

## 2013-12-14 DIAGNOSIS — F32A Depression, unspecified: Secondary | ICD-10-CM

## 2013-12-14 DIAGNOSIS — I2583 Coronary atherosclerosis due to lipid rich plaque: Secondary | ICD-10-CM | POA: Diagnosis not present

## 2013-12-14 DIAGNOSIS — M797 Fibromyalgia: Secondary | ICD-10-CM

## 2013-12-14 LAB — COMPREHENSIVE METABOLIC PANEL
ALT: 9 U/L (ref 0–35)
AST: 20 U/L (ref 0–37)
Albumin: 4.2 g/dL (ref 3.5–5.2)
Alkaline Phosphatase: 59 U/L (ref 39–117)
BILIRUBIN TOTAL: 0.5 mg/dL (ref 0.2–1.2)
BUN: 12 mg/dL (ref 6–23)
CO2: 30 mEq/L (ref 19–32)
CREATININE: 0.9 mg/dL (ref 0.4–1.2)
Calcium: 9.7 mg/dL (ref 8.4–10.5)
Chloride: 100 mEq/L (ref 96–112)
GFR: 80.97 mL/min (ref >60.00)
GLUCOSE: 91 mg/dL (ref 70–99)
Potassium: 3.1 mEq/L — ABNORMAL LOW (ref 3.5–5.1)
Sodium: 139 mEq/L (ref 135–145)
Total Protein: 7 g/dL (ref 6.0–8.3)

## 2013-12-14 LAB — LIPID PANEL
CHOLESTEROL: 257 mg/dL — AB (ref 0–200)
HDL: 61.3 mg/dL (ref >39.00)
LDL Cholesterol: 162 mg/dL — ABNORMAL HIGH (ref 0–99)
NonHDL: 195.7
Total CHOL/HDL Ratio: 4
Triglycerides: 171 mg/dL — ABNORMAL HIGH (ref 0.0–149.0)
VLDL: 34.2 mg/dL (ref 0.0–40.0)

## 2013-12-14 LAB — CBC WITH DIFFERENTIAL/PLATELET
BASOS PCT: 0.7 % (ref 0.0–3.0)
Basophils Absolute: 0 10*3/uL (ref 0.0–0.1)
Eosinophils Absolute: 0.2 10*3/uL (ref 0.0–0.7)
Eosinophils Relative: 3.7 % (ref 0.0–5.0)
HEMATOCRIT: 37.2 % (ref 36.0–46.0)
HEMOGLOBIN: 12.1 g/dL (ref 12.0–15.0)
Lymphocytes Relative: 31.9 % (ref 12.0–46.0)
Lymphs Abs: 1.4 10*3/uL (ref 0.7–4.0)
MCHC: 32.6 g/dL (ref 30.0–36.0)
MCV: 81 fl (ref 78.0–100.0)
MONOS PCT: 9.7 % (ref 3.0–12.0)
Monocytes Absolute: 0.4 10*3/uL (ref 0.1–1.0)
NEUTROS ABS: 2.3 10*3/uL (ref 1.4–7.7)
Neutrophils Relative %: 54 % (ref 43.0–77.0)
Platelets: 205 10*3/uL (ref 150.0–400.0)
RBC: 4.6 Mil/uL (ref 3.87–5.11)
RDW: 14.3 % (ref 11.5–15.5)
WBC: 4.3 10*3/uL (ref 4.0–10.5)

## 2013-12-14 LAB — TSH: TSH: 0.8 u[IU]/mL (ref 0.35–4.50)

## 2013-12-14 MED ORDER — BUPROPION HCL ER (XL) 150 MG PO TB24: 150.0000 mg | ORAL_TABLET | Freq: Every day | ORAL | Status: DC

## 2013-12-14 NOTE — Assessment & Plan Note (Signed)
New- most consistent with trochanteric bursitis. Will get xray today given h/o fall. Will refer to Dr Lorelei Pont pending xray results.

## 2013-12-14 NOTE — Patient Instructions (Signed)
Nice to meet you. I will call you with your lab results and xray results.  We are starting Wellbutrin 150 mg daily- please call me in one month with an update.  Please schedule a Wellness Visit on your way out.

## 2013-12-14 NOTE — Progress Notes (Signed)
Subjective:   Patient ID: [name], female    DOB: [birth year], 68 y.o.   MRN: [id number]  [name] is a pleasant 68 y.o. year old female who presents to clinic today with Establish Care, Foot Swelling and Hip Pain  on 12/14/2013  HPI:  [facility] in 07/2013 and landed on left hip.  Hip was sore but past 4-5 weeks, feels it is getting worse.  If she sleeps on left side, sometimes wakes up with left hip and back pain.   She feels it is affecting her exercises as well- walking around the track is bothering her.  She is walking on the treadmill four times a week.  H/o left breast CA- s/p chemotherapy and radiation in 2000- was seeing Dr. Griffith Citron.  Mammogram 05/2013. Has had had some lymphedema in left arm but now concerned because her legs are swelling.  Takes prn Demadex for swelling but does not like to take this.  HLD- on Pravachol 20 mg daily. Overdue for labs.  CAD- ? S/p MI- was seeing Dr. Terrence Dupont years ago. S/p heart cath- awaiting records. No CP or SOB.  Depression- was on wellbutrin previously during her cancer tx. Husband died in 02-23-2023 and her son is going through a cancer scare as well. She is more tearful.  Denies feeling anxious.  Has a strong faith. No SI or HI.  Current Outpatient Prescriptions on File Prior to Visit  Medication Sig Dispense Refill  . KLOR-CON M20 20 MEQ tablet Take 40 mEq by mouth daily.       [name] losartan-hydrochlorothiazide (HYZAAR) 100-25 MG per tablet Take 1 tablet by mouth daily.  30 tablet  0  . meloxicam (MOBIC) 15 MG tablet Take 15 mg by mouth daily.      . pantoprazole (PROTONIX) 40 MG tablet Take 40 mg by mouth daily.      . Polyethylene Glycol 3350 (MIRALAX PO) Take 17 g by mouth daily as needed. For constipation. Mix with 8 ounces of fluid.      . pravastatin (PRAVACHOL) 20 MG tablet Take 20 mg by mouth daily.      . Probiotic Product (ALIGN PO) Take 1 tablet by mouth daily.       [name] torsemide (DEMADEX) 20 MG tablet Take 20 mg  by mouth daily as needed. For fluid      . traZODone (DESYREL) 150 MG tablet Take 150 mg by mouth at bedtime.       No current facility-administered medications on file prior to visit.    Allergies  Allergen Reactions  . Codeine Itching    Past Medical History  Diagnosis Date  . Diverticulitis     2009, while hosp for Diverticulitis, experienced pneumonia   . Fibromyalgia   . Gastritis   . GERD (gastroesophageal reflux disease)   . CAD (coronary artery disease)   . Colon polyp   . Hemorrhoids   . Hyperlipidemia   . Pneumonia     2009  . Blood transfusion     /w childbirth   . Cancer     L breast - chemotherapy & radiation- 2000  . Lymph edema     L arm, treatment 2013- PT until recent weeks   . Hypertension     sees [facility]- thinks last appt. 2012    Past Surgical History  Procedure Laterality Date  . Appendectomy    . Rotator cuff repair    . Nasal sinus surgery    . Abdominal adhesion  surgery    . Cardiac catheterization      yrs. ago- Dr. Terrence Dupont- [facility]  . Tonsillectomy      as a child  . Ingrown toenails       R foot & hammer toe surgery   . Inguinal hernia repair  09/27/2011    Procedure: HERNIA REPAIR INGUINAL ADULT;  Surgeon: Gwenyth Ober, MD;  Location: [place];  Service: General;  Laterality: Right;  . Breast surgery      Mastectomy  . Breast reconstruction    . Knee surgery    . Abdominal hysterectomy      bleeding, leiomyomata  . Anterior and posterior vaginal repair  2010    Family History  Problem Relation Age of Onset  . Breast cancer Sister     9's  . Colon polyps Sister   . Alzheimer's disease Mother   . Cancer Son     prostate  . Anesthesia problems Neg Hx     History   Social History  . Marital Status: Married    Spouse Name: N/A    Number of Children: N/A  . Years of Education: N/A   Occupational History  . Not on file.   Social History Main Topics  . Smoking status: Former Smoker    Quit date: 09/20/1998  . Smokeless  tobacco: Never Used  . Alcohol Use: No  . Drug Use: No  . Sexual Activity: No   Other Topics Concern  . Not on file   Social History Narrative   Widowed- husband died of Alzheimers in February 25, 2013.   The PMH, PSH, Social History, Family History, Medications, and allergies have been reviewed in [facility], and have been updated if relevant.   Review of Systems  Constitutional: Negative for fever, diaphoresis, activity change, appetite change and fatigue.  HENT: Negative.   Eyes: Negative.   Respiratory: Negative.   Cardiovascular: Positive for leg swelling. Negative for chest pain and palpitations.  Gastrointestinal: Negative.   Endocrine: Negative.   Genitourinary: Negative.   Musculoskeletal: Negative.   Skin: Negative.   Psychiatric/Behavioral: Negative for suicidal ideas and sleep disturbance. The patient is not nervous/anxious.       See HPI Objective:    BP 116/70  Pulse 57  Temp(Src) 97.5 F (36.4 C) (Oral)  Ht 5\' 8"  (1.727 m)  Wt 207 lb (93.895 kg)  BMI 31.48 kg/m2  SpO2 96%   Physical Exam  Nursing note and vitals reviewed. Constitutional: She is oriented to person, place, and time. She appears well-developed and well-nourished. No distress.  HENT:  Head: Normocephalic and atraumatic.  Eyes: Pupils are equal, round, and reactive to light.  Neck: Normal range of motion. Neck supple.  Cardiovascular: Normal rate, regular rhythm and normal heart sounds.   Pulmonary/Chest: Effort normal and breath sounds normal. No respiratory distress. She has no wheezes. She has no rales.  Abdominal: Soft. Bowel sounds are normal. She exhibits no distension. There is no tenderness.  Musculoskeletal:       Legs: Thick ankles but non pitting  Neurological: She is alert and oriented to person, place, and time. No cranial nerve deficit. Coordination normal.  Psychiatric: She has a normal mood and affect. Her behavior is normal. Judgment and thought content normal.            Assessment & Plan:   HLD (hyperlipidemia)  Essential hypertension  Gastroesophageal reflux disease, esophagitis presence not specified  Coronary artery disease due to lipid rich plaque  Depression  History of breast cancer in female  Edema No Follow-up on file.

## 2013-12-14 NOTE — Assessment & Plan Note (Signed)
Well controlled on current rx. No changes made. 

## 2013-12-14 NOTE — Assessment & Plan Note (Signed)
New- ?lymphadema. She is quite active and not having any cardiac symptoms. Per pt, cath and echo were neg recently- awaiting records.

## 2013-12-14 NOTE — Assessment & Plan Note (Signed)
Refilled her pravachol today. Check labs.

## 2013-12-14 NOTE — Assessment & Plan Note (Signed)
S/p radiation and chemotherapy. UTD mammogram.

## 2013-12-14 NOTE — Progress Notes (Signed)
Pre visit review using our clinic review tool, if applicable. No additional management support is needed unless otherwise documented below in the visit note. 

## 2013-12-14 NOTE — Assessment & Plan Note (Signed)
Has been on protonix for years.  I advised trying H2 blocker but she feels her symptoms too severe- aware of risks of chronic PPI use.

## 2013-12-14 NOTE — Assessment & Plan Note (Signed)
Deteriorated. Declining psychotherapy at this time. Has strong family support. Restart Wellbutrin 150 mg XL daily. Follow up in 1 month.

## 2013-12-16 ENCOUNTER — Other Ambulatory Visit: Payer: Self-pay | Admitting: *Deleted

## 2013-12-16 MED ORDER — PRAVASTATIN SODIUM 20 MG PO TABS: 20.0000 mg | ORAL_TABLET | Freq: Every day | ORAL | Status: DC

## 2013-12-16 NOTE — Telephone Encounter (Signed)
Received a refill request for pravastatin. #[phone] was last filled on 10/31/13. Pt just had labs, and we are awaiting a call back from her re: med.

## 2013-12-20 ENCOUNTER — Encounter: Payer: Self-pay | Admitting: *Deleted

## 2013-12-20 ENCOUNTER — Encounter: Payer: Self-pay | Admitting: Family Medicine

## 2013-12-20 ENCOUNTER — Ambulatory Visit (INDEPENDENT_AMBULATORY_CARE_PROVIDER_SITE_OTHER): Payer: Medicare Other | Admitting: Family Medicine

## 2013-12-20 VITALS — BP 100/64 | HR 69 | Temp 98.1°F | Ht 68.0 in | Wt 208.5 lb

## 2013-12-20 DIAGNOSIS — I2583 Coronary atherosclerosis due to lipid rich plaque: Secondary | ICD-10-CM

## 2013-12-20 DIAGNOSIS — M7062 Trochanteric bursitis, left hip: Principal | ICD-10-CM

## 2013-12-20 DIAGNOSIS — M76899 Other specified enthesopathies of unspecified lower limb, excluding foot: Secondary | ICD-10-CM | POA: Diagnosis not present

## 2013-12-20 DIAGNOSIS — I2510 Atherosclerotic heart disease of native coronary artery without angina pectoris: Secondary | ICD-10-CM

## 2013-12-20 DIAGNOSIS — M7061 Trochanteric bursitis, right hip: Secondary | ICD-10-CM

## 2013-12-20 NOTE — Progress Notes (Signed)
Pre visit review using our clinic review tool, if applicable. No additional management support is needed unless otherwise documented below in the visit note. 

## 2013-12-20 NOTE — Progress Notes (Signed)
Dr. Frederico Hamman T. Alanys Godino, MD, [facility] [place], [address] Phone: [phone] Fax: [phone]  12/20/2013  Patient: [name], MRN: [id number], DOB: November 20, 1945, 68 y.o.  Primary Physician:  Arnette Norris, MD  Chief Complaint: Bursitis  Subjective:   [name] is a 68 y.o. very pleasant female patient who presents with the following:  The patient has had a bout with cancer, and per her report, after this and after her chemotherapy she develops in chronic pain. She also carries a diagnosis of fibromyalgia. Right now she has some pain on the lateral aspect of her left hip, greater than her right hip which has been ongoing for about 3 or 4 months. Hips have been bothering, then started to get some chronic pain - was seeing Dr. Estanislado Pandy. [facility] in March and it has been hurting a lot.   Not able to walk right now.   B troch bursa injections  Past Medical History, Surgical History, Social History, Family History, Problem List, Medications, and Allergies have been reviewed and updated if relevant.  GEN: No fevers, chills. Nontoxic. Primarily MSK c/o today. MSK: Detailed in the HPI GI: tolerating PO intake without difficulty Neuro: No numbness, parasthesias, or tingling associated. Otherwise the pertinent positives of the ROS are noted above.   Objective:   BP 100/64  Pulse 69  Temp(Src) 98.1 F (36.7 C) (Oral)  Ht 5\' 8"  (1.727 m)  Wt 208 lb 8 oz (94.575 kg)  BMI 31.71 kg/m2   GEN: WDWN, NAD, Non-toxic, Alert & Oriented x 3 HEENT: Atraumatic, Normocephalic.  Ears and Nose: No external deformity. EXTR: No clubbing/cyanosis/edema NEURO: Normal gait.  PSYCH: Normally interactive. Conversant. Not depressed or anxious appearing.  Calm demeanor.   HIP EXAM: SIDE: B ROM: Abduction, Flexion, Internal and External range of motion: Modestly lacks about 15 of internal range of motion on the left greater than  the right. On the right is approximately 5-10 of motion. Pain with terminal IROM and EROM: Minimal her modest GTB: TTP B SLR: NEG Knees: No effusion FABER: NT REVERSE FABER: NT, neg Piriformis: NT at direct palpation Str: flexion: 4+/5 abduction: 4/5 adduction: 5/5  Radiology: Dg Hip Complete Left  12/14/2013   CLINICAL DATA:  LEFT hip pain after a fall in April 2015.  EXAM: LEFT HIP - COMPLETE 2+ VIEW  COMPARISON:  None.  FINDINGS: There is no evidence of hip fracture or dislocation. Moderate degenerative change with joint space narrowing and bony overgrowth. LEFT groin surgical clips.  IMPRESSION: Negative for fracture.  LEFT hip DJD.   Electronically Signed   By: Rolla Flatten M.D.   On: 12/14/2013 11:09    Assessment and Plan:   Trochanteric bursitis of both hips  Challenging, suspected fibromyalgia and multiple other pain complaints predispose her to ongoing bursitis. Recommended basic strengthening, weight loss.  Trochanteric Bursitis Injection, RIGHT Verbal consent obtained. Risks (including infection, potential atrophy), benefits, and alternatives reviewed. Greater trochanter sterilely prepped with Chloraprep. Ethyl Chloride used for anesthesia. 8 cc of Lidocaine 1% injected with Depo-Medrol 80 mg into trochanteric bursa at area of maximal tenderness at greater trochanter. Needle taken to bone to troch bursa, flows easily. Bursa massaged. No bleeding and no complications. Decreased pain after injection. Needle: 22 gauge spinal needle   Trochanteric Bursitis Injection, LEFT Verbal consent obtained. Risks (including infection, potential atrophy), benefits, and alternatives reviewed. Greater trochanter sterilely prepped with Chloraprep. Ethyl Chloride used for anesthesia. 8  cc of Lidocaine 1% injected with Depo-Medrol 80 mg into trochanteric bursa at area of maximal tenderness at greater trochanter. Needle taken to bone to troch bursa, flows easily. Bursa massaged. No bleeding and no  complications. Decreased pain after injection. Needle: 22 gauge spinal needle   Signed,  Lebert Lovern T. Kaniyah Lisby, MD   Patient's Medications  New Prescriptions   No medications on file  Previous Medications   BUPROPION (WELLBUTRIN XL) 150 MG 24 HR TABLET    Take 1 tablet (150 mg total) by mouth daily.   KLOR-CON M20 20 MEQ TABLET    Take 40 mEq by mouth daily.    LOSARTAN-HYDROCHLOROTHIAZIDE (HYZAAR) 100-25 MG PER TABLET    Take 1 tablet by mouth daily.   MELOXICAM (MOBIC) 15 MG TABLET    Take 15 mg by mouth daily.   PANTOPRAZOLE (PROTONIX) 40 MG TABLET    Take 40 mg by mouth daily.   POLYETHYLENE GLYCOL 3350 (MIRALAX PO)    Take 17 g by mouth daily as needed. For constipation. Mix with 8 ounces of fluid.   PRAVASTATIN (PRAVACHOL) 20 MG TABLET    Take 1 tablet (20 mg total) by mouth daily.   PROBIOTIC PRODUCT (ALIGN PO)    Take 1 tablet by mouth daily.    TORSEMIDE (DEMADEX) 20 MG TABLET    Take 20 mg by mouth daily as needed. For fluid   TRAZODONE (DESYREL) 150 MG TABLET    Take 150 mg by mouth at bedtime.  Modified Medications   No medications on file  Discontinued Medications   No medications on file

## 2013-12-24 ENCOUNTER — Telehealth: Payer: Self-pay

## 2013-12-24 NOTE — Telephone Encounter (Signed)
Pt called to update chart with cell # [phone]. Information is already in pts chart.

## 2014-01-07 ENCOUNTER — Other Ambulatory Visit: Payer: Self-pay | Admitting: Family Medicine

## 2014-01-14 ENCOUNTER — Encounter: Payer: Self-pay | Admitting: Family Medicine

## 2014-01-14 ENCOUNTER — Ambulatory Visit (INDEPENDENT_AMBULATORY_CARE_PROVIDER_SITE_OTHER): Payer: Medicare Other | Admitting: Family Medicine

## 2014-01-14 VITALS — BP 118/78 | HR 77 | Temp 98.1°F | Wt 210.0 lb

## 2014-01-14 DIAGNOSIS — E876 Hypokalemia: Secondary | ICD-10-CM | POA: Diagnosis not present

## 2014-01-14 DIAGNOSIS — I2583 Coronary atherosclerosis due to lipid rich plaque: Secondary | ICD-10-CM

## 2014-01-14 DIAGNOSIS — F3289 Other specified depressive episodes: Secondary | ICD-10-CM | POA: Diagnosis not present

## 2014-01-14 DIAGNOSIS — I2510 Atherosclerotic heart disease of native coronary artery without angina pectoris: Secondary | ICD-10-CM

## 2014-01-14 DIAGNOSIS — G4700 Insomnia, unspecified: Secondary | ICD-10-CM | POA: Insufficient documentation

## 2014-01-14 DIAGNOSIS — E785 Hyperlipidemia, unspecified: Secondary | ICD-10-CM | POA: Diagnosis not present

## 2014-01-14 DIAGNOSIS — F32A Depression, unspecified: Secondary | ICD-10-CM

## 2014-01-14 DIAGNOSIS — I10 Essential (primary) hypertension: Secondary | ICD-10-CM | POA: Diagnosis not present

## 2014-01-14 DIAGNOSIS — F329 Major depressive disorder, single episode, unspecified: Secondary | ICD-10-CM | POA: Diagnosis not present

## 2014-01-14 LAB — COMPREHENSIVE METABOLIC PANEL
ALT: 10 U/L (ref 0–35)
AST: 18 U/L (ref 0–37)
Albumin: 4.4 g/dL (ref 3.5–5.2)
Alkaline Phosphatase: 65 U/L (ref 39–117)
BILIRUBIN TOTAL: 0.4 mg/dL (ref 0.2–1.2)
BUN: 12 mg/dL (ref 6–23)
CO2: 29 mEq/L (ref 19–32)
CREATININE: 0.9 mg/dL (ref 0.4–1.2)
Calcium: 10.2 mg/dL (ref 8.4–10.5)
Chloride: 102 mEq/L (ref 96–112)
GFR: 80.95 mL/min (ref >60.00)
Glucose, Bld: 96 mg/dL (ref 70–99)
Potassium: 3.5 mEq/L (ref 3.5–5.1)
SODIUM: 138 meq/L (ref 135–145)
Total Protein: 7.3 g/dL (ref 6.0–8.3)

## 2014-01-14 LAB — LIPID PANEL
CHOL/HDL RATIO: 3
Cholesterol: 249 mg/dL — ABNORMAL HIGH (ref 0–200)
HDL: 78.6 mg/dL (ref >39.00)
LDL Cholesterol: 139 mg/dL — ABNORMAL HIGH (ref 0–99)
NONHDL: 170.4
Triglycerides: 157 mg/dL — ABNORMAL HIGH (ref 0.0–149.0)
VLDL: 31.4 mg/dL (ref 0.0–40.0)

## 2014-01-14 NOTE — Patient Instructions (Signed)
Great to see you. I will call you with your lab results.  Try adding Benadryl and or over the counter melatonin for sleep.  Update me in a few weeks.

## 2014-01-14 NOTE — Progress Notes (Signed)
Pre visit review using our clinic review tool, if applicable. No additional management support is needed unless otherwise documented below in the visit note. 

## 2014-01-14 NOTE — Progress Notes (Signed)
68 yo pleasant female here for one month follow up.  Established care with me one month ago.   H/o left breast CA- s/p chemotherapy and radiation in 2000- was seeing Dr. Griffith Citron.  Mammogram 05/2013. Has had had some lymphedema in left arm but now concerned because her legs are swelling.  Takes prn Demadex for swelling but does not like to take this.  HLD- on Pravachol 20 mg daily.  Had been out of it for a month when we checked her cholesterol in August. Lab Results  Component Value Date   CHOL 257* 12/14/2013   HDL 61.30 12/14/2013   LDLCALC 162* 12/14/2013   TRIG 171.0* 12/14/2013   CHOLHDL 4 12/14/2013     CAD- ? S/p MI- was seeing Dr. Terrence Dupont years ago. S/p heart cath- awaiting records. No CP or SOB.  Depression- had deteriorated when I met her last month. Husband died in March 07, 2023 and her son is going through a cancer scare as well.  No SI or HI.  Started Wellbutrin 150 mg XL daily (had taken this in past) but declined psychotherapy.  She feels this is helping.  Less tearful.  She enjoys spending time with her 48 year old great grandson.  Hypokalemia-  Has not been taking her potassium regularly when we checked her CMET last month.  She is not taking it as prescribed. Denies any muscle aches, cramps, or palpitations.  Lab Results  Component Value Date   NA 139 12/14/2013   K 3.1* 12/14/2013   CL 100 12/14/2013   CO2 30 12/14/2013   Insomnia- has been taking Trazodone 150 mg nightly for years. Past couple of months, waking up int he middle of the night.    Current Outpatient Prescriptions on File Prior to Visit  Medication Sig Dispense Refill  . buPROPion (WELLBUTRIN XL) 150 MG 24 hr tablet Take 1 tablet (150 mg total) by mouth daily.  30 tablet  1  . KLOR-CON M20 20 MEQ tablet Take 40 mEq by mouth daily.       [name] losartan-hydrochlorothiazide (HYZAAR) 100-25 MG per tablet TAKE 1 TABLET BY MOUTH DAILY  30 tablet  3  . meloxicam (MOBIC) 15 MG tablet Take 15 mg by mouth daily.       . pantoprazole (PROTONIX) 40 MG tablet Take 40 mg by mouth daily.      . Polyethylene Glycol 3350 (MIRALAX PO) Take 17 g by mouth daily as needed. For constipation. Mix with 8 ounces of fluid.      . pravastatin (PRAVACHOL) 20 MG tablet Take 1 tablet (20 mg total) by mouth daily.  30 tablet  3  . Probiotic Product (ALIGN PO) Take 1 tablet by mouth daily.       [name] torsemide (DEMADEX) 20 MG tablet Take 20 mg by mouth daily as needed. For fluid      . traZODone (DESYREL) 150 MG tablet Take 150 mg by mouth at bedtime.       No current facility-administered medications on file prior to visit.    Allergies  Allergen Reactions  . Codeine Itching    Past Medical History  Diagnosis Date  . Diverticulitis     2009, while hosp for Diverticulitis, experienced pneumonia   . Fibromyalgia   . Gastritis   . GERD (gastroesophageal reflux disease)   . CAD (coronary artery disease)   . Colon polyp   . Hemorrhoids   . Hyperlipidemia   . Pneumonia     2009  . Blood  transfusion     /w childbirth   . Cancer     L breast - chemotherapy & radiation- 2000  . Lymph edema     L arm, treatment 2013- PT until recent weeks   . Hypertension     sees [facility]- thinks last appt. 2012    Past Surgical History  Procedure Laterality Date  . Appendectomy    . Rotator cuff repair    . Nasal sinus surgery    . Abdominal adhesion surgery    . Cardiac catheterization      yrs. ago- Dr. Terrence Dupont- [facility]  . Tonsillectomy      as a child  . Ingrown toenails       R foot & hammer toe surgery   . Inguinal hernia repair  09/27/2011    Procedure: HERNIA REPAIR INGUINAL ADULT;  Surgeon: Gwenyth Ober, MD;  Location: [place];  Service: General;  Laterality: Right;  . Breast surgery      Mastectomy  . Breast reconstruction    . Knee surgery    . Abdominal hysterectomy      bleeding, leiomyomata  . Anterior and posterior vaginal repair  2010    Family History  Problem Relation Age of Onset  . Breast cancer Sister      23's  . Colon polyps Sister   . Alzheimer's disease Mother   . Cancer Son     prostate  . Anesthesia problems Neg Hx     History   Social History  . Marital Status: Married    Spouse Name: N/A    Number of Children: N/A  . Years of Education: N/A   Occupational History  . Not on file.   Social History Main Topics  . Smoking status: Former Smoker    Quit date: 09/20/1998  . Smokeless tobacco: Never Used  . Alcohol Use: No  . Drug Use: No  . Sexual Activity: No   Other Topics Concern  . Not on file   Social History Narrative   Widowed- husband died of Alzheimers in 2013/02/08.   The PMH, PSH, Social History, Family History, Medications, and allergies have been reviewed in [facility], and have been updated if relevant.  ROS:  See HPI Denies panic attacks Appetite good- weight stable Wt Readings from Last 3 Encounters:  01/14/14 210 lb (95.255 kg)  12/20/13 208 lb 8 oz (94.575 kg)  12/14/13 207 lb (93.895 kg)   No CP or SOB  No hip pain  Physical exam:  BP 118/78  Pulse 77  Temp(Src) 98.1 F (36.7 C) (Oral)  Wt 210 lb (95.255 kg)  SpO2 97%  General:  Well-developed,well-nourished,in no acute distress; alert,appropriate and cooperative throughout examination Lungs:  Normal respiratory effort, chest expands symmetrically. Lungs are clear to auscultation, no crackles or wheezes. Heart:  Normal rate and regular rhythm. S1 and S2 normal without gallop, murmur, click, rub or other extra sounds. Neurologic:  alert & oriented X3 and gait normal.   Skin:  Intact without suspicious lesions or rashes Psych:  Cognition and judgment appear intact. Alert and cooperative with normal attention span and concentration. No apparent delusions, illusions, hallucinations

## 2014-01-14 NOTE — Assessment & Plan Note (Signed)
Well controlled on current rx. No changes today.

## 2014-01-14 NOTE — Assessment & Plan Note (Signed)
New- she is taking her potassium daily now. Recheck lytes today.

## 2014-01-14 NOTE — Assessment & Plan Note (Signed)
Deteriorated. Has been taking her pravachol daily now. Will recheck lipid panel today. The patient indicates understanding of these issues and agrees with the plan.

## 2014-01-14 NOTE — Assessment & Plan Note (Signed)
Deteriorated. >25 min spent with patient, at least half of which was spent on counseling insomnia.  The problem of recurrent insomnia is discussed. Avoidance of caffeine sources is strongly encouraged. Sleep hygiene issues are reviewed.   Advised adding melatonin and or antihistamine. Call or return to clinic prn if these symptoms worsen or fail to improve as anticipated. The patient indicates understanding of these issues and agrees with the plan.

## 2014-01-14 NOTE — Assessment & Plan Note (Signed)
Improved on Wellbutrin XL 150 mg daily. Continue current dose.

## 2014-01-31 ENCOUNTER — Other Ambulatory Visit: Payer: Self-pay

## 2014-01-31 NOTE — Telephone Encounter (Signed)
Pt left v/m requesting refill trazodone to [facility].

## 2014-02-01 MED ORDER — TRAZODONE HCL 150 MG PO TABS: 150.0000 mg | ORAL_TABLET | Freq: Every day | ORAL | Status: DC

## 2014-02-01 NOTE — Addendum Note (Signed)
Addended by: Modena Nunnery on: 02/01/2014 09:28 AM   Modules accepted: Orders

## 2014-02-21 ENCOUNTER — Encounter: Payer: Self-pay | Admitting: Family Medicine

## 2014-02-27 ENCOUNTER — Other Ambulatory Visit: Payer: Self-pay | Admitting: Family Medicine

## 2014-03-08 ENCOUNTER — Encounter: Payer: Self-pay | Admitting: Gynecology

## 2014-03-08 ENCOUNTER — Ambulatory Visit (INDEPENDENT_AMBULATORY_CARE_PROVIDER_SITE_OTHER): Payer: Medicare Other | Admitting: Gynecology

## 2014-03-08 VITALS — BP 112/70 | Ht 68.0 in | Wt 210.0 lb

## 2014-03-08 DIAGNOSIS — N649 Disorder of breast, unspecified: Secondary | ICD-10-CM | POA: Diagnosis not present

## 2014-03-08 DIAGNOSIS — C50912 Malignant neoplasm of unspecified site of left female breast: Secondary | ICD-10-CM | POA: Diagnosis not present

## 2014-03-08 DIAGNOSIS — I2510 Atherosclerotic heart disease of native coronary artery without angina pectoris: Secondary | ICD-10-CM

## 2014-03-08 DIAGNOSIS — N952 Postmenopausal atrophic vaginitis: Secondary | ICD-10-CM

## 2014-03-08 DIAGNOSIS — L988 Other specified disorders of the skin and subcutaneous tissue: Secondary | ICD-10-CM

## 2014-03-08 NOTE — Patient Instructions (Signed)
You may obtain a copy of any labs that were done today by logging onto MyChart as outlined in the instructions provided with your AVS (after visit summary). The office will not call with normal lab results but certainly if there are any significant abnormalities then we will contact you.   Health Maintenance, Female A healthy lifestyle and preventative care can promote health and wellness.  Maintain regular health, dental, and eye exams.  Eat a healthy diet. Foods like vegetables, fruits, whole grains, low-fat dairy products, and lean protein foods contain the nutrients you need without too many calories. Decrease your intake of foods high in solid fats, added sugars, and salt. Get information about a proper diet from your caregiver, if necessary.  Regular physical exercise is one of the most important things you can do for your health. Most adults should get at least 150 minutes of moderate-intensity exercise (any activity that increases your heart rate and causes you to sweat) each week. In addition, most adults need muscle-strengthening exercises on 2 or more days a week.   Maintain a healthy weight. The body mass index (BMI) is a screening tool to identify possible weight problems. It provides an estimate of body fat based on height and weight. Your caregiver can help determine your BMI, and can help you achieve or maintain a healthy weight. For adults 20 years and older:  A BMI below 18.5 is considered underweight.  A BMI of 18.5 to 24.9 is normal.  A BMI of 25 to 29.9 is considered overweight.  A BMI of 30 and above is considered obese.  Maintain normal blood lipids and cholesterol by exercising and minimizing your intake of saturated fat. Eat a balanced diet with plenty of fruits and vegetables. Blood tests for lipids and cholesterol should begin at age 61 and be repeated every 5 years. If your lipid or cholesterol levels are high, you are over 50, or you are a high risk for heart  disease, you may need your cholesterol levels checked more frequently.Ongoing high lipid and cholesterol levels should be treated with medicines if diet and exercise are not effective.  If you smoke, find out from your caregiver how to quit. If you do not use tobacco, do not start.  Lung cancer screening is recommended for adults aged 33 80 years who are at high risk for developing lung cancer because of a history of smoking. Yearly low-dose computed tomography (CT) is recommended for people who have at least a 30-pack-year history of smoking and are a current smoker or have quit within the past 15 years. A pack year of smoking is smoking an average of 1 pack of cigarettes a day for 1 year (for example: 1 pack a day for 30 years or 2 packs a day for 15 years). Yearly screening should continue until the smoker has stopped smoking for at least 15 years. Yearly screening should also be stopped for people who develop a health problem that would prevent them from having lung cancer treatment.  If you are pregnant, do not drink alcohol. If you are breastfeeding, be very cautious about drinking alcohol. If you are not pregnant and choose to drink alcohol, do not exceed 1 drink per day. One drink is considered to be 12 ounces (355 mL) of beer, 5 ounces (148 mL) of wine, or 1.5 ounces (44 mL) of liquor.  Avoid use of street drugs. Do not share needles with anyone. Ask for help if you need support or instructions about stopping  the use of drugs.  High blood pressure causes heart disease and increases the risk of stroke. Blood pressure should be checked at least every 1 to 2 years. Ongoing high blood pressure should be treated with medicines, if weight loss and exercise are not effective.  If you are 59 to 68 years old, ask your caregiver if you should take aspirin to prevent strokes.  Diabetes screening involves taking a blood sample to check your fasting blood sugar level. This should be done once every 3  years, after age 91, if you are within normal weight and without risk factors for diabetes. Testing should be considered at a younger age or be carried out more frequently if you are overweight and have at least 1 risk factor for diabetes.  Breast cancer screening is essential preventative care for women. You should practice "breast self-awareness." This means understanding the normal appearance and feel of your breasts and may include breast self-examination. Any changes detected, no matter how small, should be reported to a caregiver. Women in their 66s and 30s should have a clinical breast exam (CBE) by a caregiver as part of a regular health exam every 1 to 3 years. After age 101, women should have a CBE every year. Starting at age 100, women should consider having a mammogram (breast X-ray) every year. Women who have a family history of breast cancer should talk to their caregiver about genetic screening. Women at a high risk of breast cancer should talk to their caregiver about having an MRI and a mammogram every year.  Breast cancer gene (BRCA)-related cancer risk assessment is recommended for women who have family members with BRCA-related cancers. BRCA-related cancers include breast, ovarian, tubal, and peritoneal cancers. Having family members with these cancers may be associated with an increased risk for harmful changes (mutations) in the breast cancer genes BRCA1 and BRCA2. Results of the assessment will determine the need for genetic counseling and BRCA1 and BRCA2 testing.  The Pap test is a screening test for cervical cancer. Women should have a Pap test starting at age 57. Between ages 25 and 35, Pap tests should be repeated every 2 years. Beginning at age 37, you should have a Pap test every 3 years as long as the past 3 Pap tests have been normal. If you had a hysterectomy for a problem that was not cancer or a condition that could lead to cancer, then you no longer need Pap tests. If you are  between ages 50 and 76, and you have had normal Pap tests going back 10 years, you no longer need Pap tests. If you have had past treatment for cervical cancer or a condition that could lead to cancer, you need Pap tests and screening for cancer for at least 20 years after your treatment. If Pap tests have been discontinued, risk factors (such as a new sexual partner) need to be reassessed to determine if screening should be resumed. Some women have medical problems that increase the chance of getting cervical cancer. In these cases, your caregiver may recommend more frequent screening and Pap tests.  The human papillomavirus (HPV) test is an additional test that may be used for cervical cancer screening. The HPV test looks for the virus that can cause the cell changes on the cervix. The cells collected during the Pap test can be tested for HPV. The HPV test could be used to screen women aged 44 years and older, and should be used in women of any age  who have unclear Pap test results. After the age of 30, women should have HPV testing at the same frequency as a Pap test.  Colorectal cancer can be detected and often prevented. Most routine colorectal cancer screening begins at the age of 50 and continues through age 75. However, your caregiver may recommend screening at an earlier age if you have risk factors for colon cancer. On a yearly basis, your caregiver may provide home test kits to check for hidden blood in the stool. Use of a small camera at the end of a tube, to directly examine the colon (sigmoidoscopy or colonoscopy), can detect the earliest forms of colorectal cancer. Talk to your caregiver about this at age 50, when routine screening begins. Direct examination of the colon should be repeated every 5 to 10 years through age 75, unless early forms of pre-cancerous polyps or small growths are found.  Hepatitis C blood testing is recommended for all people born from 1945 through 1965 and any  individual with known risks for hepatitis C.  Practice safe sex. Use condoms and avoid high-risk sexual practices to reduce the spread of sexually transmitted infections (STIs). Sexually active women aged 25 and younger should be checked for Chlamydia, which is a common sexually transmitted infection. Older women with new or multiple partners should also be tested for Chlamydia. Testing for other STIs is recommended if you are sexually active and at increased risk.  Osteoporosis is a disease in which the bones lose minerals and strength with aging. This can result in serious bone fractures. The risk of osteoporosis can be identified using a bone density scan. Women ages 65 and over and women at risk for fractures or osteoporosis should discuss screening with their caregivers. Ask your caregiver whether you should be taking a calcium supplement or vitamin D to reduce the rate of osteoporosis.  Menopause can be associated with physical symptoms and risks. Hormone replacement therapy is available to decrease symptoms and risks. You should talk to your caregiver about whether hormone replacement therapy is right for you.  Use sunscreen. Apply sunscreen liberally and repeatedly throughout the day. You should seek shade when your shadow is shorter than you. Protect yourself by wearing long sleeves, pants, a wide-brimmed hat, and sunglasses year round, whenever you are outdoors.  Notify your caregiver of new moles or changes in moles, especially if there is a change in shape or color. Also notify your caregiver if a mole is larger than the size of a pencil eraser.  Stay current with your immunizations. Document Released: 10/22/2010 Document Revised: 08/03/2012 Document Reviewed: 10/22/2010 [employer] Patient Information 2014 [employer].   

## 2014-03-08 NOTE — Progress Notes (Signed)
[place] Jun 08, 1945 [id number]        68 y.o.  G5P5 for follow up exam. Several issues noted below.  Past medical history,surgical history, problem list, medications, allergies, family history and social history were all reviewed and documented as reviewed in the EPIC chart.  ROS:  12 system ROS performed with pertinent positives and negatives included in the history, assessment and plan.   Additional significant findings :  none   Exam: Kim Counsellor Vitals:   03/08/14 0920  BP: 112/70  Height: 5\' 8"  (1.727 m)  Weight: 210 lb (95.255 kg)   General appearance:  Normal affect, orientation and appearance. Skin: Grossly normal with numerous classic appearing seborrheic keratoses of her chest and back. HEENT: Without gross lesions.  No cervical or supraclavicular adenopathy. Thyroid normal.  Lungs:  Clear without wheezing, rales or rhonchi Cardiac: RR, without RMG Abdominal:  Soft, nontender, without masses, guarding, rebound, organomegaly or hernia Breasts:  Examined lying and sitting. Right without masses, retractions, discharge or axillary adenopathy.  With seborrheic keratoses noted right nipple. Left status post reconstruction without masses or axillary adenopathy. Pelvic:  Ext/BUS/vagina with atrophic changes. Vagina foreshortened.  Adnexa  Without masses or tenderness    Anus and perineum  Normal   Rectovaginal  Normal sphincter tone without palpated masses or tenderness.    Assessment/Plan:  68 y.o. G78P5 female for follow up exam.   1. Post menopausal/atrophic genital changes. Status post TAH for leiomyoma/bleeding.  Subsequent anterior/posterior repair.  Patient without significant symptoms of hot flashes, night sweats or vaginal does. Not sexually active. Continue to monitor. Vagina is on the shorter side. Not an issue now but will call become sexually active and becomes an issue. 2. Numerous seborrheic keratoses. All benign in appearance. She has 2 on her  right nipple that she asked about. She reports these have not changed over years. We'll continue to monitor and report any changes. Options for excision reviewed and declined. 3. Pap smear 2012. No Pap smear done today. No history of abnormal Pap smears previously. Review current screening guidelines. Status post hysterectomy for benign indications. Over the age of 29. We both feel comfortable stop screening at this point. 4. Mammography 05/2013. History of left breast cancer status post mastectomy/reconstruction. Exam NED. Continue with annual mammography. SBE monthly reviewed. 5. Colonoscopy 2014. Repeat at their recommended interval. 6. DEXA 2009 normal. Previous studies also normal. Plan repeat DEXA at age 68. Increase calcium vitamin D reviewed. 7. Health maintenance. No routine blood work done as she does this at her primary physician's office. Follow up 1 year, sooner as needed.     Anastasio Auerbach MD, 9:47 AM 03/08/2014

## 2014-03-09 LAB — URINALYSIS W MICROSCOPIC + REFLEX CULTURE
BACTERIA UA: NONE SEEN
BILIRUBIN URINE: NEGATIVE
CASTS: NONE SEEN
Crystals: NONE SEEN
Glucose, UA: NEGATIVE mg/dL
Hgb urine dipstick: NEGATIVE
KETONES UR: NEGATIVE mg/dL
Leukocytes, UA: NEGATIVE
Nitrite: NEGATIVE
PH: 5 (ref 5.0–8.0)
Protein, ur: NEGATIVE mg/dL
SPECIFIC GRAVITY, URINE: 1.025 (ref 1.005–1.030)
Urobilinogen, UA: 0.2 mg/dL (ref 0.0–1.0)

## 2014-03-29 ENCOUNTER — Other Ambulatory Visit: Payer: Self-pay | Admitting: Family Medicine

## 2014-04-28 ENCOUNTER — Other Ambulatory Visit: Payer: Self-pay | Admitting: Family Medicine

## 2014-05-11 ENCOUNTER — Other Ambulatory Visit: Payer: Self-pay | Admitting: Family Medicine

## 2014-06-03 ENCOUNTER — Other Ambulatory Visit: Payer: Self-pay | Admitting: Family Medicine

## 2014-06-03 DIAGNOSIS — N6489 Other specified disorders of breast: Secondary | ICD-10-CM

## 2014-06-14 ENCOUNTER — Other Ambulatory Visit: Payer: Self-pay | Admitting: Family Medicine

## 2014-06-14 ENCOUNTER — Ambulatory Visit
Admission: RE | Admit: 2014-06-14 | Discharge: 2014-06-14 | Disposition: A | Discharge status: Home / Self Care | Payer: Medicare Other | Source: Ambulatory Visit | Attending: Family Medicine | Admitting: Family Medicine

## 2014-06-14 DIAGNOSIS — N6489 Other specified disorders of breast: Secondary | ICD-10-CM

## 2014-08-17 ENCOUNTER — Encounter: Payer: Self-pay | Admitting: Family Medicine

## 2014-08-17 ENCOUNTER — Ambulatory Visit (INDEPENDENT_AMBULATORY_CARE_PROVIDER_SITE_OTHER): Payer: Medicare Other | Admitting: Family Medicine

## 2014-08-17 VITALS — BP 122/64 | HR 96 | Temp 98.3°F | Wt 205.5 lb

## 2014-08-17 DIAGNOSIS — M797 Fibromyalgia: Secondary | ICD-10-CM

## 2014-08-17 DIAGNOSIS — J069 Acute upper respiratory infection, unspecified: Secondary | ICD-10-CM | POA: Diagnosis not present

## 2014-08-17 MED ORDER — PREGABALIN 75 MG PO CAPS: 75.0000 mg | ORAL_CAPSULE | Freq: Two times a day (BID) | ORAL | Status: DC

## 2014-08-17 NOTE — Assessment & Plan Note (Signed)
Deteriorated. Restart Lyrica, refer back to rheum per pt request.

## 2014-08-17 NOTE — Assessment & Plan Note (Signed)
New - resolving. Likely viral.  No further tx suggested at this time. Call or return to clinic prn if these symptoms worsen or fail to improve as anticipated. The patient indicates understanding of these issues and agrees with the plan.

## 2014-08-17 NOTE — Progress Notes (Signed)
Subjective:   Patient ID: [name], female    DOB: July 21, 1945, 69 y.o.   MRN: [id number]  [name] is a pleasant 69 y.o. year old female who presents to clinic today with fibromyalgia pain, Cough and Generalized Body Aches  on 08/17/2014  HPI:  Cough, nasal congestion- started over a week ago.  Has seasonal allergies but felt this was more than her usual symptoms of allergic rhinitis.  OTC flonase and mucinex have helped.  Symptoms are better but she still wanted me to evaluate her today since she did feel she was wheezing at one point.  Fibromyalgia- previously on Lyrica and followed by Dr. Estanislado Pandy.   Lost to follow up when her husband got sick.  Lately, having more bilateral shoulder and hip pain.  Current Outpatient Prescriptions on File Prior to Visit  Medication Sig Dispense Refill  . losartan-hydrochlorothiazide (HYZAAR) 100-25 MG per tablet TAKE 1 TABLET BY MOUTH DAILY 30 tablet 8  . meloxicam (MOBIC) 15 MG tablet Take 15 mg by mouth daily.    . pantoprazole (PROTONIX) 40 MG tablet TAKE 1 TABLET BY MOUTH EVERY DAY 30 tablet 7  . Polyethylene Glycol 3350 (MIRALAX PO) Take 17 g by mouth daily as needed. For constipation. Mix with 8 ounces of fluid.    . potassium chloride SA (K-DUR,KLOR-CON) 20 MEQ tablet TAKE 2 TABLETS BY MOUTH TWICE DAILY 120 tablet 2  . Probiotic Product (ALIGN PO) Take 1 tablet by mouth daily.     [name] torsemide (DEMADEX) 20 MG tablet Take 20 mg by mouth daily as needed. For fluid    . traZODone (DESYREL) 150 MG tablet TAKE 1 TABLET BY MOUTH AT BEDTIME 30 tablet 2   No current facility-administered medications on file prior to visit.    Allergies  Allergen Reactions  . Codeine Itching    Past Medical History  Diagnosis Date  . Diverticulitis     2009, while hosp for Diverticulitis, experienced pneumonia   . Fibromyalgia   . Gastritis   . GERD (gastroesophageal reflux disease)   . CAD (coronary artery disease)   . Colon polyp   .  Hemorrhoids   . Hyperlipidemia   . Pneumonia     2009  . Blood transfusion     /w childbirth   . Cancer     L breast - chemotherapy & radiation- 2000  . Lymph edema     L arm, treatment 2013- PT until recent weeks   . Hypertension     sees [facility]- thinks last appt. 2012    Past Surgical History  Procedure Laterality Date  . Appendectomy    . Rotator cuff repair    . Nasal sinus surgery    . Abdominal adhesion surgery    . Cardiac catheterization      yrs. ago- Dr. Terrence Dupont- [facility]  . Tonsillectomy      as a child  . Ingrown toenails       R foot & hammer toe surgery   . Inguinal hernia repair  09/27/2011    Procedure: HERNIA REPAIR INGUINAL ADULT;  Surgeon: Gwenyth Ober, MD;  Location: [place];  Service: General;  Laterality: Right;  . Breast surgery      Mastectomy  . Breast reconstruction    . Knee surgery    . Abdominal hysterectomy      bleeding, leiomyomata  . Anterior and posterior vaginal repair  2010    Family History  Problem Relation Age of  Onset  . Breast cancer Sister     44's  . Colon polyps Sister   . Alzheimer's disease Mother   . Cancer Son     prostate  . Anesthesia problems Neg Hx     History   Social History  . Marital Status: Married    Spouse Name: N/A  . Number of Children: N/A  . Years of Education: N/A   Occupational History  . Not on file.   Social History Main Topics  . Smoking status: Former Smoker    Quit date: 09/20/1998  . Smokeless tobacco: Never Used  . Alcohol Use: No  . Drug Use: No  . Sexual Activity: Yes   Other Topics Concern  . Not on file   Social History Narrative   Widowed- husband died of Alzheimers in Feb 09, 2013.   The PMH, PSH, Social History, Family History, Medications, and allergies have been reviewed in [facility], [place], and have been updated if relevant.   Review of Systems  Constitutional: Positive for fatigue.  HENT: Positive for postnasal drip, rhinorrhea, sinus pressure, sneezing and sore throat. Negative for  trouble swallowing.   Respiratory: Positive for cough and wheezing. Negative for shortness of breath and stridor.   Gastrointestinal: Negative.   Musculoskeletal: Positive for myalgias.  Skin: Negative.   Hematological: Negative.   Psychiatric/Behavioral: Negative.   All other systems reviewed and are negative.      Objective:    BP 122/64 mmHg  Pulse 96  Temp(Src) 98.3 F (36.8 C) (Oral)  Wt 205 lb 8 oz (93.214 kg)  SpO2 98%   Physical Exam  Constitutional: She is oriented to person, place, and time. She appears well-developed and well-nourished. No distress.  HENT:  Head: Normocephalic.  Right Ear: Hearing and tympanic membrane normal.  Left Ear: Hearing and tympanic membrane normal.  Nose: Rhinorrhea present. No sinus tenderness. Right sinus exhibits no maxillary sinus tenderness and no frontal sinus tenderness. Left sinus exhibits no maxillary sinus tenderness and no frontal sinus tenderness.  Mouth/Throat: Posterior oropharyngeal erythema present. No oropharyngeal exudate or posterior oropharyngeal edema.  Eyes: Conjunctivae are normal.  Neck: Neck supple.  Cardiovascular: Normal rate and regular rhythm.   Pulmonary/Chest: Effort normal and breath sounds normal. No respiratory distress. She has no wheezes.  Musculoskeletal: She exhibits no edema.  Neurological: She is alert and oriented to person, place, and time. No cranial nerve deficit.  Skin: Skin is warm and dry.  Psychiatric: She has a normal mood and affect. Her behavior is normal. Judgment and thought content normal.          Assessment & Plan:   Acute upper respiratory infection  Fibromyalgia - Plan: Ambulatory referral to Rheumatology No Follow-up on file.

## 2014-08-17 NOTE — Patient Instructions (Signed)
Good to see you. We will call you with your rheumatology referral. We are also restarting Lyrica.

## 2014-08-17 NOTE — Progress Notes (Signed)
Pre visit review using our clinic review tool, if applicable. No additional management support is needed unless otherwise documented below in the visit note. 

## 2014-09-04 ENCOUNTER — Other Ambulatory Visit: Payer: Self-pay | Admitting: Family Medicine

## 2014-09-05 NOTE — Telephone Encounter (Signed)
Pt has not had f/u appt since establish 11/2013

## 2014-10-17 ENCOUNTER — Other Ambulatory Visit: Payer: Self-pay

## 2014-10-17 ENCOUNTER — Telehealth: Payer: Self-pay | Admitting: Family Medicine

## 2014-10-17 NOTE — Telephone Encounter (Signed)
10/17/14-spoke to pt. Explained there are no Rheumatology providers that will see pts with a fibromyalgia diagnosis, need to refer to pain clinic instead. Pt does not want to see pain mgt and says Dr. Deborra Medina is managing her fibromyalgia just fine.  Rheumatology referral cancelled -arc

## 2014-11-03 ENCOUNTER — Other Ambulatory Visit: Payer: Self-pay | Admitting: Family Medicine

## 2014-11-03 NOTE — Telephone Encounter (Signed)
Last lab 12/2013. Ok to refill?

## 2014-12-20 ENCOUNTER — Other Ambulatory Visit: Payer: Self-pay | Admitting: Family Medicine

## 2014-12-21 NOTE — Telephone Encounter (Signed)
Last K+ lab 12/2013. pls advise

## 2014-12-28 ENCOUNTER — Other Ambulatory Visit: Payer: Self-pay | Admitting: Dermatology

## 2014-12-28 DIAGNOSIS — L309 Dermatitis, unspecified: Secondary | ICD-10-CM | POA: Diagnosis not present

## 2014-12-28 DIAGNOSIS — L305 Pityriasis alba: Secondary | ICD-10-CM | POA: Diagnosis not present

## 2014-12-28 DIAGNOSIS — L859 Epidermal thickening, unspecified: Secondary | ICD-10-CM | POA: Diagnosis not present

## 2015-02-02 ENCOUNTER — Ambulatory Visit (INDEPENDENT_AMBULATORY_CARE_PROVIDER_SITE_OTHER): Payer: Medicare Other

## 2015-02-02 DIAGNOSIS — Z23 Encounter for immunization: Secondary | ICD-10-CM | POA: Diagnosis not present

## 2015-02-07 ENCOUNTER — Other Ambulatory Visit: Payer: Self-pay | Admitting: Family Medicine

## 2015-02-21 ENCOUNTER — Ambulatory Visit (INDEPENDENT_AMBULATORY_CARE_PROVIDER_SITE_OTHER): Payer: Medicare Other | Admitting: Family Medicine

## 2015-02-21 ENCOUNTER — Encounter: Payer: Self-pay | Admitting: Family Medicine

## 2015-02-21 VITALS — BP 128/62 | HR 73 | Temp 97.4°F | Ht 68.0 in | Wt 208.5 lb

## 2015-02-21 DIAGNOSIS — I2510 Atherosclerotic heart disease of native coronary artery without angina pectoris: Secondary | ICD-10-CM

## 2015-02-21 DIAGNOSIS — Z853 Personal history of malignant neoplasm of breast: Secondary | ICD-10-CM

## 2015-02-21 DIAGNOSIS — G4700 Insomnia, unspecified: Secondary | ICD-10-CM | POA: Diagnosis not present

## 2015-02-21 DIAGNOSIS — Z0000 Encounter for general adult medical examination without abnormal findings: Secondary | ICD-10-CM | POA: Diagnosis not present

## 2015-02-21 DIAGNOSIS — Z23 Encounter for immunization: Secondary | ICD-10-CM | POA: Diagnosis not present

## 2015-02-21 DIAGNOSIS — F329 Major depressive disorder, single episode, unspecified: Secondary | ICD-10-CM | POA: Diagnosis not present

## 2015-02-21 DIAGNOSIS — I10 Essential (primary) hypertension: Secondary | ICD-10-CM

## 2015-02-21 DIAGNOSIS — E785 Hyperlipidemia, unspecified: Secondary | ICD-10-CM

## 2015-02-21 DIAGNOSIS — I2583 Coronary atherosclerosis due to lipid rich plaque: Secondary | ICD-10-CM

## 2015-02-21 DIAGNOSIS — F32A Depression, unspecified: Secondary | ICD-10-CM

## 2015-02-21 LAB — COMPREHENSIVE METABOLIC PANEL
ALK PHOS: 72 U/L (ref 39–117)
ALT: 6 U/L (ref 0–35)
AST: 13 U/L (ref 0–37)
Albumin: 4 g/dL (ref 3.5–5.2)
BILIRUBIN TOTAL: 0.4 mg/dL (ref 0.2–1.2)
BUN: 12 mg/dL (ref 6–23)
CO2: 32 meq/L (ref 19–32)
Calcium: 10.2 mg/dL (ref 8.4–10.5)
Chloride: 101 mEq/L (ref 96–112)
Creatinine, Ser: 0.86 mg/dL (ref 0.40–1.20)
GFR: 83.94 mL/min (ref >60.00)
Glucose, Bld: 99 mg/dL (ref 70–99)
POTASSIUM: 3.5 meq/L (ref 3.5–5.1)
Sodium: 142 mEq/L (ref 135–145)
Total Protein: 6.7 g/dL (ref 6.0–8.3)

## 2015-02-21 LAB — CBC WITH DIFFERENTIAL/PLATELET
Basophils Absolute: 0 10*3/uL (ref 0.0–0.1)
Basophils Relative: 0.5 % (ref 0.0–3.0)
Eosinophils Absolute: 0.2 10*3/uL (ref 0.0–0.7)
Eosinophils Relative: 4.8 % (ref 0.0–5.0)
HCT: 39.3 % (ref 36.0–46.0)
Hemoglobin: 12.9 g/dL (ref 12.0–15.0)
LYMPHS ABS: 1.5 10*3/uL (ref 0.7–4.0)
Lymphocytes Relative: 32.8 % (ref 12.0–46.0)
MCHC: 32.9 g/dL (ref 30.0–36.0)
MCV: 79.7 fl (ref 78.0–100.0)
Monocytes Absolute: 0.3 10*3/uL (ref 0.1–1.0)
Monocytes Relative: 6.7 % (ref 3.0–12.0)
NEUTROS ABS: 2.5 10*3/uL (ref 1.4–7.7)
NEUTROS PCT: 55.2 % (ref 43.0–77.0)
PLATELETS: 207 10*3/uL (ref 150.0–400.0)
RBC: 4.93 Mil/uL (ref 3.87–5.11)
RDW: 14.5 % (ref 11.5–15.5)
WBC: 4.4 10*3/uL (ref 4.0–10.5)

## 2015-02-21 LAB — LIPID PANEL
Cholesterol: 288 mg/dL — ABNORMAL HIGH (ref 0–200)
HDL: 60.5 mg/dL (ref >39.00)
NONHDL: 227.76
Total CHOL/HDL Ratio: 5
Triglycerides: 217 mg/dL — ABNORMAL HIGH (ref 0.0–149.0)
VLDL: 43.4 mg/dL — AB (ref 0.0–40.0)

## 2015-02-21 LAB — LDL CHOLESTEROL, DIRECT: LDL DIRECT: 189 mg/dL

## 2015-02-21 LAB — TSH: TSH: 2.34 u[IU]/mL (ref 0.35–4.50)

## 2015-02-21 MED ORDER — PANTOPRAZOLE SODIUM 40 MG PO TBEC: 40.0000 mg | DELAYED_RELEASE_TABLET | Freq: Every day | ORAL | Status: DC

## 2015-02-21 MED ORDER — LOSARTAN POTASSIUM-HCTZ 100-25 MG PO TABS: 1.0000 | ORAL_TABLET | Freq: Every day | ORAL | Status: DC

## 2015-02-21 MED ORDER — TRAZODONE HCL 150 MG PO TABS: 150.0000 mg | ORAL_TABLET | Freq: Every day | ORAL | Status: DC

## 2015-02-21 MED ORDER — POTASSIUM CHLORIDE CRYS ER 20 MEQ PO TBCR: 40.0000 meq | EXTENDED_RELEASE_TABLET | Freq: Two times a day (BID) | ORAL | Status: DC

## 2015-02-21 NOTE — Progress Notes (Signed)
Pre visit review using our clinic review tool, if applicable. No additional management support is needed unless otherwise documented below in the visit note. 

## 2015-02-21 NOTE — Assessment & Plan Note (Signed)
surveillance only. Mammogram UTD.

## 2015-02-21 NOTE — Assessment & Plan Note (Signed)
Has been intolerant to statin.  Recheck lipid panel today. ?zetia as an alterative. The patient indicates understanding of these issues and agrees with the plan.

## 2015-02-21 NOTE — Assessment & Plan Note (Signed)
Symptoms controlled without rx.

## 2015-02-21 NOTE — Assessment & Plan Note (Signed)
Well controlled with current dose of Trazodone.  No changes made.

## 2015-02-21 NOTE — Progress Notes (Signed)
69 yo pleasant female here for annual medicare wellness visit and follow up of chronic medical conditions.  I have personally reviewed the Medicare Annual Wellness questionnaire and have noted 1. The patient's medical and social history 2. Their use of alcohol, tobacco or illicit drugs 3. Their current medications and supplements 4. The patient's functional ability including ADL's, fall risks, home safety risks and hearing or visual             impairment. 5. Diet and physical activities 6. Evidence for depression or mood disorders  End of life wishes discussed and updated in Social History.  The roster of all physicians providing medical care to patient - is listed in the [employer] section of the chart.  Colonoscopy 07/03/12- Mann Influenza vaccine 02/02/15  Doing well.  Enjoying spending time with her grand son and great grand daughter.  H/o left breast CA- s/p chemotherapy and radiation in 2000- was seeing Dr. Griffith Citron.  Mammogram UTD- 06/14/14   HLD- was taking Pravachol 20 mg daily. Causes myalgias.  Lab Results  Component Value Date   CHOL 249* 01/14/2014   HDL 78.60 01/14/2014   LDLCALC 139* 01/14/2014   TRIG 157.0* 01/14/2014   CHOLHDL 3 01/14/2014     CAD-  S/p MI- was seeing Dr. Terrence Dupont years ago. No CP or SOB.  Depression-No SI or HI. Stop taking Wellbutrin 150 mg XL daily and has declined psychotherapy.  Feels she is doing well.  Trazodone helpful for insomnia.  Hypokalemia-  Has not been taking her potassium regularly when we checked her CMET last month.  She is not taking it as prescribed. Denies any muscle aches, cramps, or palpitations.  Lab Results  Component Value Date   NA 138 01/14/2014   K 3.5 01/14/2014   CL 102 01/14/2014   CO2 29 01/14/2014   Insomnia- has been taking Trazodone 150 mg nightly for years. Past couple of months, waking up int he middle of the night.    Current Outpatient Prescriptions on File Prior to Visit  Medication Sig  Dispense Refill  . losartan-hydrochlorothiazide (HYZAAR) 100-25 MG tablet TAKE 1 TABLET BY MOUTH EVERY DAY 90 tablet 0  . meloxicam (MOBIC) 15 MG tablet Take 15 mg by mouth daily.    . pantoprazole (PROTONIX) 40 MG tablet TAKE 1 TABLET BY MOUTH EVERY DAY 30 tablet 7  . Polyethylene Glycol 3350 (MIRALAX PO) Take 17 g by mouth daily as needed. For constipation. Mix with 8 ounces of fluid.    . potassium chloride SA (K-DUR,KLOR-CON) 20 MEQ tablet TAKE 2 TABLETS BY MOUTH TWICE DAILY 120 tablet 0  . pregabalin (LYRICA) 75 MG capsule Take 1 capsule (75 mg total) by mouth 2 (two) times daily. 30 capsule 0  . Probiotic Product (ALIGN PO) Take 1 tablet by mouth daily.     [name] torsemide (DEMADEX) 20 MG tablet Take 20 mg by mouth daily as needed. For fluid    . traZODone (DESYREL) 150 MG tablet TAKE 1 TABLET BY MOUTH AT BEDTIME 30 tablet 3   No current facility-administered medications on file prior to visit.    Allergies  Allergen Reactions  . Codeine Itching    Past Medical History  Diagnosis Date  . Diverticulitis     2009, while hosp for Diverticulitis, experienced pneumonia   . Fibromyalgia   . Gastritis   . GERD (gastroesophageal reflux disease)   . CAD (coronary artery disease)   . Colon polyp   . Hemorrhoids   . Hyperlipidemia   .  Pneumonia     2009  . Blood transfusion     /w childbirth   . Cancer ([place])     L breast - chemotherapy & radiation- 2000  . Lymph edema     L arm, treatment 2013- PT until recent weeks   . Hypertension     sees [facility]- thinks last appt. 2012    Past Surgical History  Procedure Laterality Date  . Appendectomy    . Rotator cuff repair    . Nasal sinus surgery    . Abdominal adhesion surgery    . Cardiac catheterization      yrs. ago- Dr. Terrence Dupont- [facility]  . Tonsillectomy      as a child  . Ingrown toenails       R foot & hammer toe surgery   . Inguinal hernia repair  09/27/2011    Procedure: HERNIA REPAIR INGUINAL ADULT;  Surgeon: Gwenyth Ober, MD;   Location: [facility];  Service: General;  Laterality: Right;  . Breast surgery      Mastectomy  . Breast reconstruction    . Knee surgery    . Abdominal hysterectomy      bleeding, leiomyomata  . Anterior and posterior vaginal repair  2010    Family History  Problem Relation Age of Onset  . Breast cancer Sister     89's  . Colon polyps Sister   . Alzheimer's disease Mother   . Cancer Son     prostate  . Anesthesia problems Neg Hx     Social History   Social History  . Marital Status: Married    Spouse Name: N/A  . Number of Children: N/A  . Years of Education: N/A   Occupational History  . Not on file.   Social History Main Topics  . Smoking status: Former Smoker    Quit date: 09/20/1998  . Smokeless tobacco: Never Used  . Alcohol Use: No  . Drug Use: No  . Sexual Activity: Yes   Other Topics Concern  . Not on file   Social History Narrative   Widowed- husband died of Alzheimers in 27-Feb-2013.   The PMH, PSH, Social History, Family History, Medications, and allergies have been reviewed in [facility], and have been updated if relevant.  Review of Systems  Constitutional: Negative.   HENT: Negative.   Eyes: Negative.   Respiratory: Negative.   Cardiovascular: Negative.   Gastrointestinal: Negative.   Endocrine: Negative.   Genitourinary: Negative.   Musculoskeletal: Negative.   Skin: Negative.   Allergic/Immunologic: Negative.   Neurological: Negative.   Hematological: Negative.   Psychiatric/Behavioral: Negative.   All other systems reviewed and are negative.   Wt Readings from Last 3 Encounters:  02/21/15 208 lb 8 oz (94.575 kg)  08/17/14 205 lb 8 oz (93.214 kg)  03/08/14 210 lb (95.255 kg)    Physical exam:  BP 128/62 mmHg  Pulse 73  Temp(Src) 97.4 F (36.3 C) (Oral)  Ht '5\' 8"'$  (1.727 m)  Wt 208 lb 8 oz (94.575 kg)  BMI 31.71 kg/m2  SpO2 98%  Physical Exam  Constitutional: She is oriented to person, place, and time and well-developed,  well-nourished, and in no distress. No distress.  HENT:  Head: Normocephalic.  Eyes: Conjunctivae are normal.  Cardiovascular: Normal rate.   Pulmonary/Chest: Effort normal.  Abdominal: Soft.  Musculoskeletal: She exhibits no edema.  Neurological: She is alert and oriented to person, place, and time.  Skin: Skin is warm and dry. She is  not diaphoretic.  Psychiatric: Memory, affect and judgment normal.  Nursing note and vitals reviewed.

## 2015-02-21 NOTE — Assessment & Plan Note (Signed)
The patients weight, height, BMI and visual acuity have been recorded in the chart.  Cognitive function assessed.   I have made referrals, counseling and provided education to the patient based review of the above and I have provided the pt with a written personalized care plan for preventive services.  Prevnar 13 given today. Labs as well.  She will call insurance company regarding zostavax coverage.

## 2015-02-21 NOTE — Assessment & Plan Note (Signed)
Well controlled on current rx. No changes made. 

## 2015-02-21 NOTE — Patient Instructions (Signed)
Great to see you. Check with your insurance to see if they will cover the shingles shot.  We will call you with your lab results and you can view them online.

## 2015-02-22 LAB — HEPATITIS C ANTIBODY: HCV Ab: NEGATIVE

## 2015-02-23 ENCOUNTER — Other Ambulatory Visit: Payer: Self-pay | Admitting: Family Medicine

## 2015-02-23 MED ORDER — EZETIMIBE 10 MG PO TABS: 10.0000 mg | ORAL_TABLET | Freq: Every day | ORAL | Status: DC

## 2015-03-10 ENCOUNTER — Encounter: Payer: Medicare Other | Admitting: Women's Health

## 2015-03-28 ENCOUNTER — Encounter: Payer: Self-pay | Admitting: Gynecology

## 2015-03-28 ENCOUNTER — Ambulatory Visit (INDEPENDENT_AMBULATORY_CARE_PROVIDER_SITE_OTHER): Payer: Medicare Other | Admitting: Gynecology

## 2015-03-28 ENCOUNTER — Other Ambulatory Visit (HOSPITAL_COMMUNITY)
Admission: RE | Admit: 2015-03-28 | Discharge: 2015-03-28 | Disposition: A | Discharge status: Home / Self Care | Payer: Medicare Other | Source: Ambulatory Visit | Attending: Gynecology | Admitting: Gynecology

## 2015-03-28 VITALS — BP 118/76 | Ht 68.25 in | Wt 203.0 lb

## 2015-03-28 DIAGNOSIS — Z124 Encounter for screening for malignant neoplasm of cervix: Secondary | ICD-10-CM | POA: Diagnosis not present

## 2015-03-28 DIAGNOSIS — I2510 Atherosclerotic heart disease of native coronary artery without angina pectoris: Secondary | ICD-10-CM

## 2015-03-28 DIAGNOSIS — Z1272 Encounter for screening for malignant neoplasm of vagina: Secondary | ICD-10-CM | POA: Diagnosis not present

## 2015-03-28 DIAGNOSIS — N952 Postmenopausal atrophic vaginitis: Secondary | ICD-10-CM | POA: Diagnosis not present

## 2015-03-28 DIAGNOSIS — R8279 Other abnormal findings on microbiological examination of urine: Secondary | ICD-10-CM | POA: Diagnosis not present

## 2015-03-28 DIAGNOSIS — C50912 Malignant neoplasm of unspecified site of left female breast: Secondary | ICD-10-CM | POA: Diagnosis not present

## 2015-03-28 DIAGNOSIS — C50911 Malignant neoplasm of unspecified site of right female breast: Secondary | ICD-10-CM | POA: Diagnosis not present

## 2015-03-28 DIAGNOSIS — Z01419 Encounter for gynecological examination (general) (routine) without abnormal findings: Secondary | ICD-10-CM

## 2015-03-28 NOTE — Patient Instructions (Signed)
Follow up for the bone density as scheduled.  You may obtain a copy of any labs that were done today by logging onto MyChart as outlined in the instructions provided with your AVS (after visit summary). The office will not call with normal lab results but certainly if there are any significant abnormalities then we will contact you.   Health Maintenance Adopting a healthy lifestyle and getting preventive care can go a long way to promote health and wellness. Talk with your health care provider about what schedule of regular examinations is right for you. This is a good chance for you to check in with your provider about disease prevention and staying healthy. In between checkups, there are plenty of things you can do on your own. Experts have done a lot of research about which lifestyle changes and preventive measures are most likely to keep you healthy. Ask your health care provider for more information. WEIGHT AND DIET  Eat a healthy diet  Be sure to include plenty of vegetables, fruits, low-fat dairy products, and lean protein.  Do not eat a lot of foods high in solid fats, added sugars, or salt.  Get regular exercise. This is one of the most important things you can do for your health.  Most adults should exercise for at least 150 minutes each week. The exercise should increase your heart rate and make you sweat (moderate-intensity exercise).  Most adults should also do strengthening exercises at least twice a week. This is in addition to the moderate-intensity exercise.  Maintain a healthy weight  Body mass index (BMI) is a measurement that can be used to identify possible weight problems. It estimates body fat based on height and weight. Your health care provider can help determine your BMI and help you achieve or maintain a healthy weight.  For females 69 years of age and older:   A BMI below 18.5 is considered underweight.  A BMI of 18.5 to 24.9 is normal.  A BMI of 25 to 29.9  is considered overweight.  A BMI of 30 and above is considered obese.  Watch levels of cholesterol and blood lipids  You should start having your blood tested for lipids and cholesterol at 69 years of age, then have this test every 5 years.  You may need to have your cholesterol levels checked more often if:  Your lipid or cholesterol levels are high.  You are older than 69 years of age.  You are at high risk for heart disease.  CANCER SCREENING   Lung Cancer  Lung cancer screening is recommended for adults 69-75 years old who are at high risk for lung cancer because of a history of smoking.  A yearly low-dose CT scan of the lungs is recommended for people who:  Currently smoke.  Have quit within the past 15 years.  Have at least a 30-pack-year history of smoking. A pack year is smoking an average of one pack of cigarettes a day for 1 year.  Yearly screening should continue until it has been 15 years since you quit.  Yearly screening should stop if you develop a health problem that would prevent you from having lung cancer treatment.  Breast Cancer  Practice breast self-awareness. This means understanding how your breasts normally appear and feel.  It also means doing regular breast self-exams. Let your health care provider know about any changes, no matter how small.  If you are in your 20s or 30s, you should have a clinical breast  exam (CBE) by a health care provider every 1-3 years as part of a regular health exam.  If you are 69 or older, have a CBE every year. Also consider having a breast X-ray (mammogram) every year.  If you have a family history of breast cancer, talk to your health care provider about genetic screening.  If you are at high risk for breast cancer, talk to your health care provider about having an MRI and a mammogram every year.  Breast cancer gene (BRCA) assessment is recommended for women who have family members with BRCA-related cancers.  BRCA-related cancers include:  Breast.  Ovarian.  Tubal.  Peritoneal cancers.  Results of the assessment will determine the need for genetic counseling and BRCA1 and BRCA2 testing. Cervical Cancer Routine pelvic examinations to screen for cervical cancer are no longer recommended for nonpregnant women who are considered low risk for cancer of the pelvic organs (ovaries, uterus, and vagina) and who do not have symptoms. A pelvic examination may be necessary if you have symptoms including those associated with pelvic infections. Ask your health care provider if a screening pelvic exam is right for you.   The Pap test is the screening test for cervical cancer for women who are considered at risk.  If you had a hysterectomy for a problem that was not cancer or a condition that could lead to cancer, then you no longer need Pap tests.  If you are older than 65 years, and you have had normal Pap tests for the past 10 years, you no longer need to have Pap tests.  If you have had past treatment for cervical cancer or a condition that could lead to cancer, you need Pap tests and screening for cancer for at least 20 years after your treatment.  If you no longer get a Pap test, assess your risk factors if they change (such as having a new sexual partner). This can affect whether you should start being screened again.  Some women have medical problems that increase their chance of getting cervical cancer. If this is the case for you, your health care provider may recommend more frequent screening and Pap tests.  The human papillomavirus (HPV) test is another test that may be used for cervical cancer screening. The HPV test looks for the virus that can cause cell changes in the cervix. The cells collected during the Pap test can be tested for HPV.  The HPV test can be used to screen women 8 years of age and older. Getting tested for HPV can extend the interval between normal Pap tests from three to  five years.  An HPV test also should be used to screen women of any age who have unclear Pap test results.  After 69 years of age, women should have HPV testing as often as Pap tests.  Colorectal Cancer  This type of cancer can be detected and often prevented.  Routine colorectal cancer screening usually begins at 69 years of age and continues through 69 years of age.  Your health care provider may recommend screening at an earlier age if you have risk factors for colon cancer.  Your health care provider may also recommend using home test kits to check for hidden blood in the stool.  A small camera at the end of a tube can be used to examine your colon directly (sigmoidoscopy or colonoscopy). This is done to check for the earliest forms of colorectal cancer.  Routine screening usually begins at age 26.  Direct examination of the colon should be repeated every 5-10 years through 69 years of age. However, you may need to be screened more often if early forms of precancerous polyps or small growths are found. Skin Cancer  Check your skin from head to toe regularly.  Tell your health care provider about any new moles or changes in moles, especially if there is a change in a mole's shape or color.  Also tell your health care provider if you have a mole that is larger than the size of a pencil eraser.  Always use sunscreen. Apply sunscreen liberally and repeatedly throughout the day.  Protect yourself by wearing long sleeves, pants, a wide-brimmed hat, and sunglasses whenever you are outside. HEART DISEASE, DIABETES, AND HIGH BLOOD PRESSURE   Have your blood pressure checked at least every 1-2 years. High blood pressure causes heart disease and increases the risk of stroke.  If you are between 76 years and 55 years old, ask your health care provider if you should take aspirin to prevent strokes.  Have regular diabetes screenings. This involves taking a blood sample to check your  fasting blood sugar level.  If you are at a normal weight and have a low risk for diabetes, have this test once every three years after 69 years of age.  If you are overweight and have a high risk for diabetes, consider being tested at a younger age or more often. PREVENTING INFECTION  Hepatitis B  If you have a higher risk for hepatitis B, you should be screened for this virus. You are considered at high risk for hepatitis B if:  You were born in a country where hepatitis B is common. Ask your health care provider which countries are considered high risk.  Your parents were born in a high-risk country, and you have not been immunized against hepatitis B (hepatitis B vaccine).  You have HIV or AIDS.  You use needles to inject street drugs.  You live with someone who has hepatitis B.  You have had sex with someone who has hepatitis B.  You get hemodialysis treatment.  You take certain medicines for conditions, including cancer, organ transplantation, and autoimmune conditions. Hepatitis C  Blood testing is recommended for:  Everyone born from [birth year] through 1965.  Anyone with known risk factors for hepatitis C. Sexually transmitted infections (STIs)  You should be screened for sexually transmitted infections (STIs) including gonorrhea and chlamydia if:  You are sexually active and are younger than 69 years of age.  You are older than 69 years of age and your health care provider tells you that you are at risk for this type of infection.  Your sexual activity has changed since you were last screened and you are at an increased risk for chlamydia or gonorrhea. Ask your health care provider if you are at risk.  If you do not have HIV, but are at risk, it may be recommended that you take a prescription medicine daily to prevent HIV infection. This is called pre-exposure prophylaxis (PrEP). You are considered at risk if:  You are sexually active and do not regularly use condoms or  know the HIV status of your partner(s).  You take drugs by injection.  You are sexually active with a partner who has HIV. Talk with your health care provider about whether you are at high risk of being infected with HIV. If you choose to begin PrEP, you should first be tested for HIV. You should then be  tested every 3 months for as long as you are taking PrEP.  PREGNANCY   If you are premenopausal and you may become pregnant, ask your health care provider about preconception counseling.  If you may become pregnant, take 400 to 800 micrograms (mcg) of folic acid every day.  If you want to prevent pregnancy, talk to your health care provider about birth control (contraception). OSTEOPOROSIS AND MENOPAUSE   Osteoporosis is a disease in which the bones lose minerals and strength with aging. This can result in serious bone fractures. Your risk for osteoporosis can be identified using a bone density scan.  If you are 25 years of age or older, or if you are at risk for osteoporosis and fractures, ask your health care provider if you should be screened.  Ask your health care provider whether you should take a calcium or vitamin D supplement to lower your risk for osteoporosis.  Menopause may have certain physical symptoms and risks.  Hormone replacement therapy may reduce some of these symptoms and risks. Talk to your health care provider about whether hormone replacement therapy is right for you.  HOME CARE INSTRUCTIONS   Schedule regular health, dental, and eye exams.  Stay current with your immunizations.   Do not use any tobacco products including cigarettes, chewing tobacco, or electronic cigarettes.  If you are pregnant, do not drink alcohol.  If you are breastfeeding, limit how much and how often you drink alcohol.  Limit alcohol intake to no more than 1 drink per day for nonpregnant women. One drink equals 12 ounces of beer, 5 ounces of wine, or 1 ounces of hard liquor.  Do  not use street drugs.  Do not share needles.  Ask your health care provider for help if you need support or information about quitting drugs.  Tell your health care provider if you often feel depressed.  Tell your health care provider if you have ever been abused or do not feel safe at home. Document Released: 10/22/2010 Document Revised: 08/23/2013 Document Reviewed: 03/10/2013 [facility] Patient Information 2015 [place], [place]. This information is not intended to replace advice given to you by your health care provider. Make sure you discuss any questions you have with your health care provider.

## 2015-03-28 NOTE — Progress Notes (Signed)
[place] 18-Mar-1946 [id number]        69 y.o.  G5P5  For breast and pelvic exam.  Past medical history,surgical history, problem list, medications, allergies, family history and social history were all reviewed and documented as reviewed in the EPIC chart.  ROS:  Performed with pertinent positives and negatives included in the history, assessment and plan.   Additional significant findings :  none   Exam: Programmer, multimedia Vitals:   03/28/15 0928  BP: 118/76  Height: 5' 8.25" (1.734 m)  Weight: 203 lb (92.08 kg)   General appearance:  Normal affect, orientation and appearance. Skin: Grossly normal HEENT: Without gross lesions.  No cervical or supraclavicular adenopathy. Thyroid normal.  Lungs:  Clear without wheezing, rales or rhonchi Cardiac: RR, without RMG Abdominal:  Soft, nontender, without masses, guarding, rebound, organomegaly or hernia Breasts:  Examined lying and sitting. Right without masses, retractions, discharge or axillary adenopathy.  Left status post reconstruction without masses or axillary adenopathy. Pelvic:  Ext/BUS/vagina with atrophic changes. Pap smear of cuff done  Adnexa  Without masses or tenderness    Anus and perineum  Normal   Rectovaginal  Normal sphincter tone without palpated masses or tenderness.    Assessment/Plan:  69 y.o. G85P5 female for breast and pelvic exam.   1. Postmenopausal/atrophic genital changes. Status post TAH for leiomyoma/menorrhagia.has initiated intercourse and is having issues with vaginal dryness. Has tried OTC lubricants. Recommend trial of OTC oil-based to see if this does not help. Does have a history of breast cancer in the issues of vaginal estrogen with absorption reviewed. Will hold on that discussion. She will follow up if the oil-based products do not help. 2. History of left breast cancer. Mammography 2016.  Exam NED. Continue with annual mammography when due. SBE monthly reviewed. 3. Pap smear 2012.  Pap smear done today. Discussed options to stop screening per current screening guidelines based on age and hysterectomy versus less frequent screening intervals. We'll readdress on an annual basis. 4. DEXA normal 2009. Recommend follow up DEXA now seven-year interval. Patient will schedule. 5. Colonoscopy 2014. Repeat at their recommended interval. 6. Health maintenance. No routine lab work done as patient reports this done at her primary physician's office. Follow up for bone density otherwise 1 year, sooner if issues.   Anastasio Auerbach MD, 10:16 AM 03/28/2015

## 2015-03-29 LAB — URINALYSIS W MICROSCOPIC + REFLEX CULTURE
BILIRUBIN URINE: NEGATIVE
Bacteria, UA: NONE SEEN [HPF]
Casts: NONE SEEN [LPF]
GLUCOSE, UA: NEGATIVE
Hgb urine dipstick: NEGATIVE
KETONES UR: NEGATIVE
LEUKOCYTES UA: NEGATIVE
Nitrite: NEGATIVE
PH: 5.5 (ref 5.0–8.0)
Protein, ur: NEGATIVE
SPECIFIC GRAVITY, URINE: 1.037 — AB (ref 1.001–1.035)
WBC UA: NONE SEEN WBC/HPF (ref <=5)
Yeast: NONE SEEN [HPF]

## 2015-03-29 LAB — CYTOLOGY - PAP

## 2015-03-31 LAB — URINE CULTURE
Colony Count: NO GROWTH
ORGANISM ID, BACTERIA: NO GROWTH

## 2015-04-19 ENCOUNTER — Ambulatory Visit (INDEPENDENT_AMBULATORY_CARE_PROVIDER_SITE_OTHER): Payer: Medicare Other | Admitting: Family Medicine

## 2015-04-19 ENCOUNTER — Encounter: Payer: Self-pay | Admitting: Family Medicine

## 2015-04-19 VITALS — BP 116/78 | HR 79 | Temp 97.9°F | Wt 206.0 lb

## 2015-04-19 DIAGNOSIS — R05 Cough: Secondary | ICD-10-CM

## 2015-04-19 DIAGNOSIS — I2510 Atherosclerotic heart disease of native coronary artery without angina pectoris: Secondary | ICD-10-CM | POA: Diagnosis not present

## 2015-04-19 DIAGNOSIS — R059 Cough, unspecified: Secondary | ICD-10-CM | POA: Insufficient documentation

## 2015-04-19 DIAGNOSIS — E785 Hyperlipidemia, unspecified: Secondary | ICD-10-CM | POA: Diagnosis not present

## 2015-04-19 LAB — COMPREHENSIVE METABOLIC PANEL
ALT: 10 U/L (ref 0–35)
AST: 15 U/L (ref 0–37)
Albumin: 4 g/dL (ref 3.5–5.2)
Alkaline Phosphatase: 68 U/L (ref 39–117)
BILIRUBIN TOTAL: 0.3 mg/dL (ref 0.2–1.2)
BUN: 9 mg/dL (ref 6–23)
CALCIUM: 10.2 mg/dL (ref 8.4–10.5)
CHLORIDE: 103 meq/L (ref 96–112)
CO2: 31 meq/L (ref 19–32)
CREATININE: 0.83 mg/dL (ref 0.40–1.20)
GFR: 87.41 mL/min (ref >60.00)
GLUCOSE: 107 mg/dL — AB (ref 70–99)
POTASSIUM: 3.6 meq/L (ref 3.5–5.1)
Sodium: 141 mEq/L (ref 135–145)
TOTAL PROTEIN: 6.7 g/dL (ref 6.0–8.3)

## 2015-04-19 LAB — LIPID PANEL
CHOL/HDL RATIO: 5
Cholesterol: 225 mg/dL — ABNORMAL HIGH (ref 0–200)
HDL: 49.9 mg/dL (ref >39.00)
LDL Cholesterol: 137 mg/dL — ABNORMAL HIGH (ref 0–99)
NONHDL: 174.75
Triglycerides: 187 mg/dL — ABNORMAL HIGH (ref 0.0–149.0)
VLDL: 37.4 mg/dL (ref 0.0–40.0)

## 2015-04-19 NOTE — Patient Instructions (Signed)
Good to see you. I will call you with your lab results from today and you can view them online.

## 2015-04-19 NOTE — Assessment & Plan Note (Signed)
Recheck lipid panel and CMET today. Continue zetia.

## 2015-04-19 NOTE — Assessment & Plan Note (Signed)
New- lungs clear on exam. Probable PND, discussed antihistamines and delsym prn cough. Call or return to clinic prn if these symptoms worsen or fail to improve as anticipated. The patient indicates understanding of these issues and agrees with the plan.

## 2015-04-19 NOTE — Progress Notes (Signed)
Pre visit review using our clinic review tool, if applicable. No additional management support is needed unless otherwise documented below in the visit note. 

## 2015-04-19 NOTE — Progress Notes (Signed)
Subjective:   Patient ID: [name], female    DOB: September 30, 1945, 69 y.o.   MRN: [id number]  [name] is a pleasant 69 y.o. year old female who presents to clinic today with Follow-up  on 04/19/2015  HPI:  HLD- LDL very high in 02/2015. She did agree to start zetia since she has been intolerant to statins. Zetia is not causing any side effects.  She is pleased.  Lab Results  Component Value Date   CHOL 288* 02/21/2015   HDL 60.50 02/21/2015   LDLCALC 139* 01/14/2014   LDLDIRECT 189.0 02/21/2015   TRIG 217.0* 02/21/2015   CHOLHDL 5 02/21/2015   Lab Results  Component Value Date   ALT 6 02/21/2015   AST 13 02/21/2015   ALKPHOS 72 02/21/2015   BILITOT 0.4 02/21/2015   Current Outpatient Prescriptions on File Prior to Visit  Medication Sig Dispense Refill  . ezetimibe (ZETIA) 10 MG tablet Take 1 tablet (10 mg total) by mouth daily. 90 tablet 3  . losartan-hydrochlorothiazide (HYZAAR) 100-25 MG tablet Take 1 tablet by mouth daily. 90 tablet 3  . pantoprazole (PROTONIX) 40 MG tablet Take 1 tablet (40 mg total) by mouth daily. 90 tablet 3  . Polyethylene Glycol 3350 (MIRALAX PO) Take 17 g by mouth daily as needed. For constipation. Mix with 8 ounces of fluid.    . potassium chloride SA (K-DUR,KLOR-CON) 20 MEQ tablet Take 2 tablets (40 mEq total) by mouth 2 (two) times daily. 120 tablet 5  . Probiotic Product (ALIGN PO) Take 1 tablet by mouth daily.     [name] torsemide (DEMADEX) 20 MG tablet Take 20 mg by mouth daily as needed. For fluid    . traZODone (DESYREL) 150 MG tablet Take 1 tablet (150 mg total) by mouth at bedtime. 90 tablet 1   No current facility-administered medications on file prior to visit.    Allergies  Allergen Reactions  . Codeine Itching  . Statins     Muscle aches    Past Medical History  Diagnosis Date  . Diverticulitis     2009, while hosp for Diverticulitis, experienced pneumonia   . Fibromyalgia   . Gastritis   . GERD  (gastroesophageal reflux disease)   . CAD (coronary artery disease)   . Colon polyp   . Hemorrhoids   . Hyperlipidemia   . Pneumonia     2009  . Blood transfusion     /w childbirth   . Cancer ([place])     L breast - chemotherapy & radiation- 2000  . Lymph edema     L arm, treatment 2013- PT until recent weeks   . Hypertension     sees [facility]- thinks last appt. 2012    Past Surgical History  Procedure Laterality Date  . Appendectomy    . Rotator cuff repair    . Nasal sinus surgery    . Abdominal adhesion surgery    . Cardiac catheterization      yrs. ago- Dr. Terrence Dupont- [facility]  . Tonsillectomy      as a child  . Ingrown toenails       R foot & hammer toe surgery   . Inguinal hernia repair  09/27/2011    Procedure: HERNIA REPAIR INGUINAL ADULT;  Surgeon: Gwenyth Ober, MD;  Location: [place];  Service: General;  Laterality: Right;  . Breast surgery      Mastectomy  . Breast reconstruction    . Knee surgery    .  Abdominal hysterectomy      bleeding, leiomyomata  . Anterior and posterior vaginal repair  2010    Family History  Problem Relation Age of Onset  . Breast cancer Sister     20's  . Colon polyps Sister   . Alzheimer's disease Mother   . Cancer Son     prostate  . Anesthesia problems Neg Hx     Social History   Social History  . Marital Status: Married    Spouse Name: N/A  . Number of Children: N/A  . Years of Education: N/A   Occupational History  . Not on file.   Social History Main Topics  . Smoking status: Former Smoker    Quit date: 09/20/1998  . Smokeless tobacco: Never Used  . Alcohol Use: No  . Drug Use: No  . Sexual Activity: Yes   Other Topics Concern  . Not on file   Social History Narrative   Widowed- husband died of Alzheimers in 02-13-2013.   The PMH, PSH, Social History, Family History, Medications, and allergies have been reviewed in [facility], and have been updated if relevant.   Review of Systems  Constitutional: Negative.   Eyes:  Negative.   Respiratory: Positive for cough. Negative for shortness of breath and wheezing.   Cardiovascular: Negative.   Gastrointestinal: Negative.   Endocrine: Negative.   Genitourinary: Negative.   Musculoskeletal: Negative.   Skin: Negative.   Neurological: Negative.   Hematological: Negative.   All other systems reviewed and are negative.      Objective:    BP 116/78 mmHg  Pulse 79  Temp(Src) 97.9 F (36.6 C) (Oral)  Wt 206 lb (93.441 kg)  SpO2 98%   Physical Exam  Constitutional: She is oriented to person, place, and time. She appears well-developed and well-nourished. No distress.  HENT:  Head: Normocephalic.  Cardiovascular: Normal rate and regular rhythm.   Pulmonary/Chest: Effort normal and breath sounds normal.  Musculoskeletal: Normal range of motion. She exhibits edema.  Neurological: She is alert and oriented to person, place, and time. No cranial nerve deficit.  Skin: Skin is warm and dry. She is not diaphoretic.  Psychiatric: She has a normal mood and affect. Her behavior is normal. Judgment and thought content normal.  Nursing note and vitals reviewed.         Assessment & Plan:   HLD (hyperlipidemia) - Plan: Lipid panel, Comprehensive metabolic panel  Cough No Follow-up on file.

## 2015-05-09 ENCOUNTER — Other Ambulatory Visit: Payer: Self-pay | Admitting: Family Medicine

## 2015-05-10 NOTE — Telephone Encounter (Signed)
pts last lab normal. pls advise

## 2015-05-30 ENCOUNTER — Other Ambulatory Visit: Payer: Self-pay | Admitting: Family Medicine

## 2015-06-10 ENCOUNTER — Other Ambulatory Visit: Payer: Self-pay | Admitting: Family Medicine

## 2015-06-13 ENCOUNTER — Other Ambulatory Visit: Payer: Self-pay

## 2015-06-13 DIAGNOSIS — Z1231 Encounter for screening mammogram for malignant neoplasm of breast: Secondary | ICD-10-CM

## 2015-07-20 ENCOUNTER — Ambulatory Visit: Payer: Medicare Other

## 2015-09-21 ENCOUNTER — Ambulatory Visit
Admission: RE | Admit: 2015-09-21 | Discharge: 2015-09-21 | Disposition: A | Discharge status: Home / Self Care | Payer: Medicare Other | Source: Ambulatory Visit

## 2015-09-21 DIAGNOSIS — Z1231 Encounter for screening mammogram for malignant neoplasm of breast: Secondary | ICD-10-CM

## 2015-12-08 ENCOUNTER — Telehealth: Payer: Self-pay | Admitting: Oncology

## 2015-12-08 NOTE — Telephone Encounter (Signed)
No records in [place], but printed office notes from [facility] (2008).  Pt verbally requested records to be printed and will pick up at 1pm today.

## 2016-01-19 ENCOUNTER — Ambulatory Visit: Payer: Medicare Other

## 2016-01-26 ENCOUNTER — Ambulatory Visit (INDEPENDENT_AMBULATORY_CARE_PROVIDER_SITE_OTHER): Payer: Medicare Other

## 2016-01-26 DIAGNOSIS — Z23 Encounter for immunization: Secondary | ICD-10-CM

## 2016-02-05 DIAGNOSIS — L905 Scar conditions and fibrosis of skin: Secondary | ICD-10-CM | POA: Diagnosis not present

## 2016-02-05 DIAGNOSIS — L309 Dermatitis, unspecified: Secondary | ICD-10-CM | POA: Diagnosis not present

## 2016-02-05 DIAGNOSIS — L810 Postinflammatory hyperpigmentation: Secondary | ICD-10-CM | POA: Diagnosis not present

## 2016-03-11 ENCOUNTER — Telehealth: Payer: Self-pay | Admitting: Family Medicine

## 2016-03-11 NOTE — Telephone Encounter (Signed)
Spoke to household member. She will have pt call office and schedule 2017 AWV + labs and OV 30 with PCP

## 2016-03-28 ENCOUNTER — Encounter: Payer: Self-pay | Admitting: Gynecology

## 2016-03-28 ENCOUNTER — Telehealth: Payer: Self-pay | Admitting: *Deleted

## 2016-03-28 ENCOUNTER — Encounter: Payer: Self-pay | Admitting: *Deleted

## 2016-03-28 ENCOUNTER — Ambulatory Visit (INDEPENDENT_AMBULATORY_CARE_PROVIDER_SITE_OTHER): Payer: Medicare Other | Admitting: Gynecology

## 2016-03-28 VITALS — BP 122/78 | Ht 69.0 in | Wt 205.0 lb

## 2016-03-28 DIAGNOSIS — N952 Postmenopausal atrophic vaginitis: Secondary | ICD-10-CM

## 2016-03-28 DIAGNOSIS — Z01411 Encounter for gynecological examination (general) (routine) with abnormal findings: Secondary | ICD-10-CM | POA: Diagnosis not present

## 2016-03-28 DIAGNOSIS — C50912 Malignant neoplasm of unspecified site of left female breast: Secondary | ICD-10-CM

## 2016-03-28 NOTE — Patient Instructions (Signed)

## 2016-03-28 NOTE — Progress Notes (Signed)
    [place] 1945/04/28 [id number]        70 y.o.  G70P5 for breast and pelvic exam  Past medical history,surgical history, problem list, medications, allergies, family history and social history were all reviewed and documented as reviewed in the EPIC chart.  ROS:  Performed with pertinent positives and negatives included in the history, assessment and plan.   Additional significant findings :  None   Exam: Caryn Bee assistant Vitals:   03/28/16 1024  BP: 122/78  Weight: 205 lb (93 kg)  Height: '5\' 9"'$  (1.753 m)   Body mass index is 30.27 kg/m.  General appearance:  Normal affect, orientation and appearance. Skin: Grossly normal HEENT: Without gross lesions.  No cervical or supraclavicular adenopathy. Thyroid normal.  Lungs:  Clear without wheezing, rales or rhonchi Cardiac: RR, without RMG Abdominal:  Soft, nontender, without masses, guarding, rebound, organomegaly or hernia Breasts:  Examined lying and sitting. Right without masses, retractions, discharge or axillary adenopathy. Left status post mastectomy with reconstruction. No masses or axillary adenopathy Pelvic:  Ext, BUS, Vagina with atrophic changes  Adnexa without masses or tenderness    Anus and perineum normal   Rectovaginal normal sphincter tone without palpated masses or tenderness.    Assessment/Plan:  70 y.o. G53P5 female for breast and pelvic exam.   1. Post menopausal/atrophic genital changes. Status post TVH for leiomyoma/menorrhagia. No significant hot flushes, night sweats, vaginal dryness. 2. History of left breast cancer. Mammography 09/2015. Exam NED. Continue with annual mammography when due. SBE monthly reviewed. 3. Pap smear 2016. No Pap smear done today. No history of significant abnormal Pap smears. Options to stop screening per current screening guidelines reviewed. Will readdress on annual basis. 4. DEXA 2009 normal. Was to schedule last year but did not. Thought she could get it free at  the hospital but is unsure. Order put in for DEXA. Stressed importance of baseline DEXA and patient will schedule. 5. Colonoscopy 2014. Repeat at their recommended interval. 6. Health maintenance. No routine lab work done as this is done elsewhere. Follow up 1 year, sooner as needed.   Anastasio Auerbach MD, 10:41 AM 03/28/2016

## 2016-03-28 NOTE — Telephone Encounter (Signed)
Okay 

## 2016-03-28 NOTE — Telephone Encounter (Signed)
Pt was seen today for annual was told you would give a Rx for left breast prostheses and bra. I print and order will be faxed to [phone]

## 2016-03-28 NOTE — Telephone Encounter (Signed)
Pt informed

## 2016-05-04 ENCOUNTER — Other Ambulatory Visit: Payer: Self-pay | Admitting: Family Medicine

## 2016-05-11 ENCOUNTER — Other Ambulatory Visit: Payer: Self-pay | Admitting: Family Medicine

## 2016-06-10 ENCOUNTER — Other Ambulatory Visit: Payer: Self-pay | Admitting: Family Medicine

## 2016-07-04 ENCOUNTER — Encounter: Payer: Self-pay | Admitting: Family Medicine

## 2016-07-04 ENCOUNTER — Ambulatory Visit (INDEPENDENT_AMBULATORY_CARE_PROVIDER_SITE_OTHER): Payer: Medicare Other | Admitting: Family Medicine

## 2016-07-04 VITALS — BP 106/60 | HR 81 | Temp 97.8°F | Wt 211.0 lb

## 2016-07-04 DIAGNOSIS — E785 Hyperlipidemia, unspecified: Secondary | ICD-10-CM | POA: Diagnosis not present

## 2016-07-04 DIAGNOSIS — R079 Chest pain, unspecified: Secondary | ICD-10-CM | POA: Insufficient documentation

## 2016-07-04 DIAGNOSIS — M25511 Pain in right shoulder: Secondary | ICD-10-CM | POA: Diagnosis not present

## 2016-07-04 DIAGNOSIS — M25512 Pain in left shoulder: Secondary | ICD-10-CM

## 2016-07-04 LAB — COMPREHENSIVE METABOLIC PANEL
ALT: 10 U/L (ref 0–35)
AST: 17 U/L (ref 0–37)
Albumin: 4.2 g/dL (ref 3.5–5.2)
Alkaline Phosphatase: 69 U/L (ref 39–117)
BUN: 12 mg/dL (ref 6–23)
CALCIUM: 10.2 mg/dL (ref 8.4–10.5)
CHLORIDE: 103 meq/L (ref 96–112)
CO2: 30 mEq/L (ref 19–32)
CREATININE: 0.82 mg/dL (ref 0.40–1.20)
GFR: 88.34 mL/min (ref >60.00)
Glucose, Bld: 98 mg/dL (ref 70–99)
Potassium: 3.4 mEq/L — ABNORMAL LOW (ref 3.5–5.1)
Sodium: 142 mEq/L (ref 135–145)
Total Bilirubin: 0.5 mg/dL (ref 0.2–1.2)
Total Protein: 6.7 g/dL (ref 6.0–8.3)

## 2016-07-04 LAB — CBC
HCT: 38.7 % (ref 36.0–46.0)
HEMOGLOBIN: 12.6 g/dL (ref 12.0–15.0)
MCHC: 32.6 g/dL (ref 30.0–36.0)
MCV: 79.8 fl (ref 78.0–100.0)
PLATELETS: 217 10*3/uL (ref 150.0–400.0)
RBC: 4.84 Mil/uL (ref 3.87–5.11)
RDW: 14.1 % (ref 11.5–15.5)
WBC: 4 10*3/uL (ref 4.0–10.5)

## 2016-07-04 LAB — TSH: TSH: 2.43 u[IU]/mL (ref 0.35–4.50)

## 2016-07-04 LAB — TROPONIN I: TNIDX: 0 ug/l (ref 0.00–0.06)

## 2016-07-04 MED ORDER — LOSARTAN POTASSIUM-HCTZ 100-25 MG PO TABS: 1.0000 | ORAL_TABLET | Freq: Every day | ORAL | 11 refills | Status: DC

## 2016-07-04 MED ORDER — TORSEMIDE 20 MG PO TABS: 20.0000 mg | ORAL_TABLET | Freq: Every day | ORAL | 5 refills | Status: DC | PRN

## 2016-07-04 MED ORDER — PANTOPRAZOLE SODIUM 40 MG PO TBEC: 40.0000 mg | DELAYED_RELEASE_TABLET | Freq: Every day | ORAL | 11 refills | Status: DC

## 2016-07-04 MED ORDER — POTASSIUM CHLORIDE CRYS ER 20 MEQ PO TBCR: EXTENDED_RELEASE_TABLET | ORAL | 11 refills | Status: DC

## 2016-07-04 NOTE — Patient Instructions (Signed)
Great to see you. I will call you with your lab results.  Please stop by to see Rosaria Ferries on your way out.  Please restart your baby aspirin.

## 2016-07-04 NOTE — Progress Notes (Signed)
Subjective:   Patient ID: [name], female    DOB: 08-07-1945, 71 y.o.   MRN: [id number]  [name] is a pleasant 71 y.o. year old female who presents to clinic today with Shoulder Pain (Bilateral for the last 4-5 weeks. Radiates into the neck) and Chest Pain (Left side deep inside for about 4-5 weeks. Some Dyspnea. Some fatigue. No headache, nausea, jaw pain, or arm pain.)  on 07/04/2016  HPI:  Has been noticing bilateral shoulder pain that radiates to her neck for past 4-5 weeks.  No known injury.  Also has had some left sided CP associated with DOE. No HA, nausea, jaw or arm pain  No diaphoresis.  Sometimes she feels it is worsened with exertion but has felt it at rest when she lays in bed.  No recent travel.  Pain is not worsened by deep inspirations.   Current Outpatient Prescriptions on File Prior to Visit  Medication Sig Dispense Refill  . losartan-hydrochlorothiazide (HYZAAR) 100-25 MG tablet TAKE 1 TABLET BY MOUTH DAILY. OFFICE VISIT REQUIRED FOR ADDITIONAL REFILLS 30 tablet 0  . pantoprazole (PROTONIX) 40 MG tablet TAKE 1 TABLET BY MOUTH EVERY DAY 30 tablet 0  . Polyethylene Glycol 3350 (MIRALAX PO) Take 17 g by mouth daily as needed. For constipation. Mix with 8 ounces of fluid.    . potassium chloride SA (K-DUR,KLOR-CON) 20 MEQ tablet TAKE 2 TABLETS(40 MEQ) BY MOUTH TWICE DAILY 120 tablet 0  . Probiotic Product (ALIGN PO) Take 1 tablet by mouth daily.     [name] torsemide (DEMADEX) 20 MG tablet Take 20 mg by mouth daily as needed. For fluid    . ezetimibe (ZETIA) 10 MG tablet Take 1 tablet (10 mg total) by mouth daily. (Patient not taking: Reported on 07/04/2016) 90 tablet 3   No current facility-administered medications on file prior to visit.     Allergies  Allergen Reactions  . Codeine Itching  . Statins     Muscle aches    Past Medical History:  Diagnosis Date  . Blood transfusion    /w childbirth   . CAD (coronary artery disease)   . Cancer  ([place])    L breast - chemotherapy & radiation- 2000  . Colon polyp   . Diverticulitis    2009, while hosp for Diverticulitis, experienced pneumonia   . Fibromyalgia   . Gastritis   . GERD (gastroesophageal reflux disease)   . Hemorrhoids   . Hyperlipidemia   . Hypertension    sees [facility]- thinks last appt. 2012  . Lymph edema    L arm, treatment 2013- PT until recent weeks   . Pneumonia    2009    Past Surgical History:  Procedure Laterality Date  . ABDOMINAL ADHESION SURGERY    . ABDOMINAL HYSTERECTOMY     bleeding, leiomyomata  . ANTERIOR AND POSTERIOR VAGINAL REPAIR  2010  . APPENDECTOMY    . BREAST RECONSTRUCTION    . BREAST SURGERY     Mastectomy  . CARDIAC CATHETERIZATION     yrs. ago- Dr. Terrence Dupont- [facility]  . ingrown toenails      R foot & hammer toe surgery   . INGUINAL HERNIA REPAIR  09/27/2011   Procedure: HERNIA REPAIR INGUINAL ADULT;  Surgeon: Gwenyth Ober, MD;  Location: [place];  Service: General;  Laterality: Right;  . KNEE SURGERY    . NASAL SINUS SURGERY    . ROTATOR CUFF REPAIR    . TONSILLECTOMY  as a child    Family History  Problem Relation Age of Onset  . Alzheimer's disease Mother   . Breast cancer Sister     15's  . Colon polyps Sister   . Cancer Son     prostate  . Anesthesia problems Neg Hx     Social History   Social History  . Marital status: Married    Spouse name: N/A  . Number of children: N/A  . Years of education: N/A   Occupational History  . Not on file.   Social History Main Topics  . Smoking status: Former Smoker    Quit date: 09/20/1998  . Smokeless tobacco: Never Used  . Alcohol use No  . Drug use: No  . Sexual activity: Yes     Comment: 1st intercourse 71 yo-Fewer than 5 partners   Other Topics Concern  . Not on file   Social History Narrative   Widowed- husband died of Alzheimers in 19-Feb-2013.   The PMH, PSH, Social History, Family History, Medications, and allergies have been reviewed in [facility], and have been  updated if relevant.   Review of Systems  Constitutional: Negative.   HENT: Negative.   Eyes: Negative.   Respiratory: Positive for shortness of breath.   Cardiovascular: Positive for chest pain. Negative for palpitations and leg swelling.  Endocrine: Negative.   Genitourinary: Negative.   Musculoskeletal: Positive for arthralgias and back pain.  Allergic/Immunologic: Negative.   Neurological: Negative.   Hematological: Negative.   Psychiatric/Behavioral: Negative.   All other systems reviewed and are negative.      Objective:    BP 106/60 (BP Location: Right Arm, Patient Position: Sitting, Cuff Size: Large)   Pulse 81   Temp 97.8 F (36.6 C) (Oral)   Wt 211 lb (95.7 kg)   SpO2 97%   BMI 31.16 kg/m    Physical Exam   General:  Well-developed,well-nourished,in no acute distress; alert,appropriate and cooperative throughout examination Head:  normocephalic and atraumatic.   Eyes:  vision grossly intact, PERRL Ears:  R ear normal and L ear normal externally, TMs clear bilaterally Nose:  no external deformity.   Mouth:  good dentition.   Neck:  No deformities, masses, or tenderness noted. Lungs:  Normal respiratory effort, chest expands symmetrically. Lungs are clear to auscultation, no crackles or wheezes. Heart:  Normal rate and regular rhythm. S1 and S2 normal without gallop, murmur, click, rub or other extra sounds. Msk:  No deformity or scoliosis noted of thoracic or lumbar spine.   Extremities:  No clubbing, cyanosis, edema, or deformity noted with normal full range of motion of all joints.   Neurologic:  alert & oriented X3 and gait normal.   Skin:  Intact without suspicious lesions or rashes Psych:  Cognition and judgment appear intact. Alert and cooperative with normal attention span and concentration. No apparent delusions, illusions, hallucinations       Assessment & Plan:   Chest pain, unspecified type  Acute pain of both shoulders No Follow-up on  file.

## 2016-07-04 NOTE — Assessment & Plan Note (Signed)
New- somewhat atypical. EKG reassuring- NSR. Will get labs today and refer to cardiology for stress test given duration of symptoms. The patient indicates understanding of these issues and agrees with the plan. Orders Placed This Encounter  Procedures  . Comprehensive metabolic panel  . D-dimer, quantitative (not at [facility])  . Troponin I  . CBC  . TSH  . EKG 12-Lead

## 2016-07-04 NOTE — Progress Notes (Signed)
Pre visit review using our clinic review tool, if applicable. No additional management support is needed unless otherwise documented below in the visit note. 

## 2016-07-05 LAB — D-DIMER, QUANTITATIVE (NOT AT ARMC): D DIMER QUANT: 0.48 ug{FEU}/mL (ref <0.50)

## 2016-07-08 ENCOUNTER — Other Ambulatory Visit: Payer: Self-pay | Admitting: Family Medicine

## 2016-07-08 MED ORDER — PRAVASTATIN SODIUM 20 MG PO TABS: 20.0000 mg | ORAL_TABLET | Freq: Every day | ORAL | 3 refills | Status: DC

## 2016-07-08 NOTE — Addendum Note (Signed)
Addended by: Lucille Passy on: 07/08/2016 03:25 PM   Modules accepted: Orders

## 2016-07-16 ENCOUNTER — Encounter: Payer: Self-pay | Admitting: Internal Medicine

## 2016-07-16 ENCOUNTER — Ambulatory Visit (INDEPENDENT_AMBULATORY_CARE_PROVIDER_SITE_OTHER): Payer: Medicare Other | Admitting: Internal Medicine

## 2016-07-16 VITALS — BP 125/69 | HR 85 | Ht 67.5 in | Wt 214.0 lb

## 2016-07-16 DIAGNOSIS — R079 Chest pain, unspecified: Secondary | ICD-10-CM

## 2016-07-16 DIAGNOSIS — I10 Essential (primary) hypertension: Secondary | ICD-10-CM | POA: Diagnosis not present

## 2016-07-16 DIAGNOSIS — R0602 Shortness of breath: Secondary | ICD-10-CM | POA: Diagnosis not present

## 2016-07-16 DIAGNOSIS — E782 Mixed hyperlipidemia: Secondary | ICD-10-CM

## 2016-07-16 DIAGNOSIS — R5383 Other fatigue: Secondary | ICD-10-CM | POA: Insufficient documentation

## 2016-07-16 NOTE — Patient Instructions (Signed)
Your physician has requested that you have an exercise stress myoview. For further information please visit [email or web address]. Please follow instruction sheet, as given.    Your physician recommends that you schedule a follow-up appointment after your stress test.

## 2016-07-16 NOTE — Progress Notes (Signed)
OFFICE NOTE  Chief Complaint:  Chest pain, fatigue, DOE  Primary Care Physician: Arnette Norris, MD  HPI:  [name] is a 71 y.o. female with a past medical history significant for breast cancer status post left mastectomy, chemotherapy and radiation therapy in 2000. She also has a possible history of fibromyalgia with musculoskeletal pain in the past. In addition she had previously seen Dr. Terrence Dupont for ongoing evaluation of possible coronary disease. She had had an abnormal nuclear stress test in the past and underwent heart catheterization in 2005. This demonstrated a 20-30% sequential proximal and mid LAD stenosis. There were no other significant findings. Recently she's been having pain across her shoulders and upper back as well as some tightness in the left chest. Sometimes this occurs with exertion, which she notes while exercising at the [facility]. Other times she is able to exercise on a treadmill without any difficulty. It does not sound like she does a high level of exercise when she is there however she is exercising. She recently has had some more fatigue and dyspnea on exertion. Blood pressure appears well controlled today. EKG shows normal sinus rhythm without ischemia. There is a family history of stroke and hypertension in both brothers and sisters however neither of her parents.  PMHx:  Past Medical History:  Diagnosis Date  . Blood transfusion    /w childbirth   . CAD (coronary artery disease)   . Cancer ([place])    L breast - chemotherapy & radiation- 2000  . Colon polyp   . Diverticulitis    2009, while hosp for Diverticulitis, experienced pneumonia   . Fibromyalgia   . Gastritis   . GERD (gastroesophageal reflux disease)   . Hemorrhoids   . Hyperlipidemia   . Hypertension    sees [facility]- thinks last appt. 2012  . Lymph edema    L arm, treatment 2013- PT until recent weeks   . Pneumonia    2009    Past Surgical History:  Procedure Laterality Date  .  ABDOMINAL ADHESION SURGERY    . ABDOMINAL HYSTERECTOMY     bleeding, leiomyomata  . ANTERIOR AND POSTERIOR VAGINAL REPAIR  2010  . APPENDECTOMY    . BREAST RECONSTRUCTION    . BREAST SURGERY     Mastectomy  . CARDIAC CATHETERIZATION     yrs. ago- Dr. Terrence Dupont- [facility]  . ingrown toenails      R foot & hammer toe surgery   . INGUINAL HERNIA REPAIR  09/27/2011   Procedure: HERNIA REPAIR INGUINAL ADULT;  Surgeon: Gwenyth Ober, MD;  Location: [place];  Service: General;  Laterality: Right;  . KNEE SURGERY    . NASAL SINUS SURGERY    . ROTATOR CUFF REPAIR    . TONSILLECTOMY     as a child    FAMHx:  Family History  Problem Relation Age of Onset  . Alzheimer's disease Mother   . Breast cancer Sister     83's  . Colon polyps Sister   . Cancer Son     prostate  . Anesthesia problems Neg Hx     SOCHx:   reports that she quit smoking about 17 years ago. She has never used smokeless tobacco. She reports that she does not drink alcohol or use drugs.  ALLERGIES:  Allergies  Allergen Reactions  . Codeine Itching  . Statins Other (See Comments)    Muscle aches    ROS: Pertinent items noted in HPI and remainder of  comprehensive ROS otherwise negative.  HOME MEDS: Current Outpatient Prescriptions on File Prior to Visit  Medication Sig Dispense Refill  . ezetimibe (ZETIA) 10 MG tablet Take 1 tablet (10 mg total) by mouth daily. 90 tablet 3  . losartan-hydrochlorothiazide (HYZAAR) 100-25 MG tablet Take 1 tablet by mouth daily. 30 tablet 11  . pantoprazole (PROTONIX) 40 MG tablet Take 1 tablet (40 mg total) by mouth daily. 30 tablet 11  . Polyethylene Glycol 3350 (MIRALAX PO) Take 17 g by mouth daily as needed. For constipation. Mix with 8 ounces of fluid.    . potassium chloride SA (K-DUR,KLOR-CON) 20 MEQ tablet TAKE 2 TABLETS(40 MEQ) BY MOUTH TWICE DAILY 120 tablet 11  . pravastatin (PRAVACHOL) 20 MG tablet Take 1 tablet (20 mg total) by mouth daily. 90 tablet 3  . Probiotic Product  (ALIGN PO) Take 1 tablet by mouth daily.     [name] torsemide (DEMADEX) 20 MG tablet Take 1 tablet (20 mg total) by mouth daily as needed. For fluid 30 tablet 5   No current facility-administered medications on file prior to visit.     LABS/IMAGING: No results found for this or any previous visit (from the past 48 hour(s)). No results found.  WEIGHTS: Wt Readings from Last 3 Encounters:  07/16/16 214 lb (97.1 kg)  07/04/16 211 lb (95.7 kg)  03/28/16 205 lb (93 kg)    VITALS: BP 125/69   Pulse 85   Ht 5' 7.5" (1.715 m)   Wt 214 lb (97.1 kg)   BMI 33.02 kg/m   EXAM: General appearance: alert and no distress Neck: no carotid bruit and no JVD Lungs: clear to auscultation bilaterally Heart: regular rate and rhythm Abdomen: soft, non-tender; bowel sounds normal; no masses,  no organomegaly Extremities: extremities normal, atraumatic, no cyanosis or edema Pulses: 2+ and symmetric Skin: Skin color, texture, turgor normal. No rashes or lesions Neurologic: Grossly normal Psych: Pleasant  EKG: Sinus rhythm at 85  ASSESSMENT: 1. Chest pain, progressive dyspnea and fatigue 2. History of coronary artery disease with mild LAD stenosis in 2005 by heart catheterization 3. Hypertension 4. Dyslipidemia  PLAN: 1.   [name] has a number of cardiac arrest the risk factors and recently has had some progressive shortness of breath and fatigue as well as chest pain. There are some typical and atypical features to her chest pain and she might have a history of fibromyalgia. Nonetheless, her progressive symptoms recently would warrant a repeat evaluation given her history of coronary artery disease more than 10 years ago. I recommend an exercise Myoview. We'll plan to see her back to discuss his findings in a few weeks.   Thanks for the consultation.  Pixie Casino, MD, [facility] Attending Cardiologist [place] 07/16/2016, 12:53 PM

## 2016-07-29 ENCOUNTER — Telehealth (HOSPITAL_COMMUNITY): Payer: Self-pay | Admitting: *Deleted

## 2016-07-29 NOTE — Telephone Encounter (Signed)
Patient given detailed instructions per Myocardial Perfusion Study Information Sheet for the test on 07/31/16 Patient notified to arrive 15 minutes early and that it is imperative to arrive on time for appointment to keep from having the test rescheduled.  If you need to cancel or reschedule your appointment, please call the office within 24 hours of your appointment. Failure to do so may result in a cancellation of your appointment, and a $50 no show fee. Patient verbalized understanding. Nishawn Rotan Jacqueline    

## 2016-07-31 ENCOUNTER — Ambulatory Visit (HOSPITAL_COMMUNITY): Payer: Medicare Other | Attending: Cardiology

## 2016-07-31 DIAGNOSIS — R0602 Shortness of breath: Secondary | ICD-10-CM | POA: Diagnosis not present

## 2016-07-31 DIAGNOSIS — R5383 Other fatigue: Secondary | ICD-10-CM | POA: Diagnosis not present

## 2016-07-31 DIAGNOSIS — R079 Chest pain, unspecified: Secondary | ICD-10-CM | POA: Diagnosis not present

## 2016-07-31 LAB — MYOCARDIAL PERFUSION IMAGING
CSEPEDS: 0 s
Estimated workload: 7 METS
Exercise duration (min): 5 min
LV dias vol: 75 mL (ref 46–106)
LV sys vol: 36 mL
MPHR: 149 {beats}/min
NUC STRESS TID: 0.81
Peak HR: 139 {beats}/min
Percent HR: 93 %
RATE: 0.28
Rest HR: 67 {beats}/min
SDS: 2
SRS: 4
SSS: 6

## 2016-07-31 MED ORDER — TECHNETIUM TC 99M TETROFOSMIN IV KIT
10.9000 | PACK | Freq: Once | INTRAVENOUS | Status: AC | PRN
  Administered 2016-07-31: 10.9 via INTRAVENOUS
  Filled 2016-07-31: qty 11

## 2016-07-31 MED ORDER — TECHNETIUM TC 99M TETROFOSMIN IV KIT
31.8000 | PACK | Freq: Once | INTRAVENOUS | Status: AC | PRN
  Administered 2016-07-31: 31.8 via INTRAVENOUS
  Filled 2016-07-31: qty 32

## 2016-08-13 ENCOUNTER — Inpatient Hospital Stay (HOSPITAL_COMMUNITY): Admission: RE | Admit: 2016-08-13 | Payer: Medicare Other | Source: Ambulatory Visit

## 2016-08-16 ENCOUNTER — Other Ambulatory Visit: Payer: Self-pay | Admitting: Gynecology

## 2016-08-16 DIAGNOSIS — Z1231 Encounter for screening mammogram for malignant neoplasm of breast: Secondary | ICD-10-CM

## 2016-08-22 ENCOUNTER — Ambulatory Visit: Payer: Medicare Other | Admitting: Internal Medicine

## 2016-08-27 ENCOUNTER — Telehealth: Payer: Self-pay | Admitting: Family Medicine

## 2016-08-27 NOTE — Telephone Encounter (Signed)
Left pt message asking to call Allison back directly at [phone] to schedule AWV.+ labs with Lesia and CPE with PCP. °

## 2016-09-03 ENCOUNTER — Other Ambulatory Visit: Payer: Self-pay | Admitting: Family Medicine

## 2016-09-09 ENCOUNTER — Other Ambulatory Visit (INDEPENDENT_AMBULATORY_CARE_PROVIDER_SITE_OTHER): Payer: Medicare Other

## 2016-09-09 DIAGNOSIS — E785 Hyperlipidemia, unspecified: Secondary | ICD-10-CM | POA: Diagnosis not present

## 2016-09-09 LAB — COMPREHENSIVE METABOLIC PANEL
ALBUMIN: 4.1 g/dL (ref 3.5–5.2)
ALT: 9 U/L (ref 0–35)
AST: 15 U/L (ref 0–37)
Alkaline Phosphatase: 72 U/L (ref 39–117)
BUN: 11 mg/dL (ref 6–23)
CHLORIDE: 104 meq/L (ref 96–112)
CO2: 30 meq/L (ref 19–32)
Calcium: 9.7 mg/dL (ref 8.4–10.5)
Creatinine, Ser: 0.87 mg/dL (ref 0.40–1.20)
GFR: 82.46 mL/min (ref >60.00)
GLUCOSE: 104 mg/dL — AB (ref 70–99)
Potassium: 3.3 mEq/L — ABNORMAL LOW (ref 3.5–5.1)
Sodium: 141 mEq/L (ref 135–145)
TOTAL PROTEIN: 6.6 g/dL (ref 6.0–8.3)
Total Bilirubin: 0.4 mg/dL (ref 0.2–1.2)

## 2016-09-09 LAB — LIPID PANEL
CHOLESTEROL: 271 mg/dL — AB (ref 0–200)
HDL: 56.5 mg/dL (ref >39.00)
LDL Cholesterol: 186 mg/dL — ABNORMAL HIGH (ref 0–99)
NONHDL: 214.59
Total CHOL/HDL Ratio: 5
Triglycerides: 144 mg/dL (ref 0.0–149.0)
VLDL: 28.8 mg/dL (ref 0.0–40.0)

## 2016-09-18 ENCOUNTER — Telehealth: Payer: Self-pay | Admitting: Internal Medicine

## 2016-09-18 NOTE — Telephone Encounter (Signed)
[name] is wanting to change providers . She would like to switch from Dr. Debara Pickett to Dr. Sallyanne Kuster . Will that be okay with you .  Thanks

## 2016-09-18 NOTE — Telephone Encounter (Signed)
Fine with me.  Dr. Lemmie Evens

## 2016-09-18 NOTE — Telephone Encounter (Signed)
No objection 

## 2016-09-24 ENCOUNTER — Ambulatory Visit
Admission: RE | Admit: 2016-09-24 | Discharge: 2016-09-24 | Disposition: A | Discharge status: Home / Self Care | Payer: Medicare Other | Source: Ambulatory Visit | Attending: Gynecology | Admitting: Gynecology

## 2016-09-24 DIAGNOSIS — Z1231 Encounter for screening mammogram for malignant neoplasm of breast: Secondary | ICD-10-CM | POA: Diagnosis not present

## 2016-09-24 HISTORY — DX: Personal history of irradiation: Z92.3

## 2016-09-27 NOTE — Telephone Encounter (Signed)
Scheduled 10/15/16

## 2016-10-10 ENCOUNTER — Ambulatory Visit: Payer: Medicare Other | Admitting: Internal Medicine

## 2016-10-15 ENCOUNTER — Ambulatory Visit: Payer: Medicare Other

## 2016-10-29 ENCOUNTER — Ambulatory Visit: Payer: Medicare Other | Admitting: Family Medicine

## 2016-10-31 ENCOUNTER — Ambulatory Visit: Payer: Medicare Other | Admitting: Cardiovascular Disease

## 2016-11-11 ENCOUNTER — Other Ambulatory Visit: Payer: Self-pay

## 2016-11-19 ENCOUNTER — Encounter (HOSPITAL_COMMUNITY): Payer: Self-pay | Admitting: Emergency Medicine

## 2016-11-19 ENCOUNTER — Ambulatory Visit (HOSPITAL_COMMUNITY)
Admission: EM | Admit: 2016-11-19 | Discharge: 2016-11-19 | Disposition: A | Discharge status: Home / Self Care | Payer: Medicare Other | Attending: Emergency Medicine | Admitting: Emergency Medicine

## 2016-11-19 DIAGNOSIS — Z961 Presence of intraocular lens: Secondary | ICD-10-CM | POA: Diagnosis not present

## 2016-11-19 DIAGNOSIS — H1131 Conjunctival hemorrhage, right eye: Secondary | ICD-10-CM | POA: Diagnosis not present

## 2016-11-19 NOTE — ED Notes (Signed)
Discharged by Dr. Alphonzo Cruise

## 2016-11-19 NOTE — Discharge Instructions (Signed)
Go to Dr. Zenia Resides office on [facility] right now. They will work you in  to make sure that there is nothing else wrong with your eye.

## 2016-11-19 NOTE — ED Provider Notes (Signed)
HPI  SUBJECTIVE:  [name] is a 71 y.o. female who presents with a foreign body sensation in medial right eye 3-4 days ago. She reports conjunctival injection, puffy, itchy eyelids. She reports blurry vision in her right eye, red, puffy eyelids, increased tearing. She reports mild eye pain. She tried warm compresses with improvement in her symptoms, Visine-like products with worsening of her symptoms and also moisturizing eyelid spray. No nausea, vomiting, fevers, photophobia, headache, halos around lights, facial pain, rash, upper respiratory like symptoms. She does report increased sneezing and allergy type symptoms recently. No pain with extraocular movements, pain worse in the dark, especially chemicals, no foreign body or trauma to the eye. She has a past medical history of hypertension, breast cancer, fibromyalgia. she takes 81 mg aspirin daily. No history of diabetes, glaucoma, coagulopathy, antiplatelet or anticoagulant use. ZYS:AYTK, Brooke Sequin, MD   Past Medical History:  Diagnosis Date  . Blood transfusion    /w childbirth   . CAD (coronary artery disease)   . Cancer ([place])    L breast - chemotherapy & radiation- 2000  . Colon polyp   . Diverticulitis    2009, while hosp for Diverticulitis, experienced pneumonia   . Fibromyalgia   . Gastritis   . GERD (gastroesophageal reflux disease)   . Hemorrhoids   . Hyperlipidemia   . Hypertension    sees [facility]- thinks last appt. 2012  . Lymph edema    L arm, treatment 2013- PT until recent weeks   . Personal history of radiation therapy 2000  . Pneumonia    2009    Past Surgical History:  Procedure Laterality Date  . ABDOMINAL ADHESION SURGERY    . ABDOMINAL HYSTERECTOMY     bleeding, leiomyomata  . ANTERIOR AND POSTERIOR VAGINAL REPAIR  2010  . APPENDECTOMY    . BREAST RECONSTRUCTION    . BREAST SURGERY     Mastectomy  . CARDIAC CATHETERIZATION     yrs. ago- Dr. Terrence Dupont- [facility]  . ingrown toenails      R foot & hammer  toe surgery   . INGUINAL HERNIA REPAIR  09/27/2011   Procedure: HERNIA REPAIR INGUINAL ADULT;  Surgeon: Gwenyth Ober, MD;  Location: [place];  Service: General;  Laterality: Right;  . KNEE SURGERY    . MASTECTOMY Left 2000  . NASAL SINUS SURGERY    . REDUCTION MAMMAPLASTY Right 2000  . ROTATOR CUFF REPAIR    . TONSILLECTOMY     as a child    Family History  Problem Relation Age of Onset  . Alzheimer's disease Mother   . Breast cancer Sister 37  . Colon polyps Sister   . Cancer Son        prostate  . Anesthesia problems Neg Hx     Social History  Substance Use Topics  . Smoking status: Former Smoker    Quit date: 09/20/1998  . Smokeless tobacco: Never Used  . Alcohol use No    No current facility-administered medications for this encounter.   Current Outpatient Prescriptions:  .  ezetimibe (ZETIA) 10 MG tablet, Take 1 tablet (10 mg total) by mouth daily., Disp: 90 tablet, Rfl: 3 .  losartan-hydrochlorothiazide (HYZAAR) 100-25 MG tablet, Take 1 tablet by mouth daily., Disp: 30 tablet, Rfl: 11 .  pantoprazole (PROTONIX) 40 MG tablet, Take 1 tablet (40 mg total) by mouth daily., Disp: 30 tablet, Rfl: 11 .  Polyethylene Glycol 3350 (MIRALAX PO), Take 17 g by mouth daily as  needed. For constipation. Mix with 8 ounces of fluid., Disp: , Rfl:  .  potassium chloride SA (K-DUR,KLOR-CON) 20 MEQ tablet, TAKE 2 TABLETS(40 MEQ) BY MOUTH TWICE DAILY, Disp: 120 tablet, Rfl: 11 .  pravastatin (PRAVACHOL) 20 MG tablet, Take 1 tablet (20 mg total) by mouth daily., Disp: 90 tablet, Rfl: 3 .  Probiotic Product (ALIGN PO), Take 1 tablet by mouth daily. , Disp: , Rfl:  .  torsemide (DEMADEX) 20 MG tablet, Take 1 tablet (20 mg total) by mouth daily as needed. For fluid, Disp: 30 tablet, Rfl: 5  Allergies  Allergen Reactions  . Codeine Itching  . Statins Other (See Comments)    Muscle aches     ROS  As noted in HPI.   Physical Exam  BP (!) 109/58 (BP Location: Right Arm)   Pulse 80    Temp 98.2 F (36.8 C) (Oral)   Resp 18   SpO2 98%   Constitutional: Well developed, well nourished, no acute distress Eyes:  No pain with EOMs, PERRLA, EOMI, Subconjunctival hemorrhage over the entire eyeball. Mild amount of chemosis lower conjunctiva. No direct or consensual photophobia, no corneal abrasion seen on fluorescein exam, negative Seidel. No corneal foreign body seen on magnification. positive periorbital bruising and tenderness. See pictures         Visual acuity:  R 20/40 L 20/50 both 20/30  HENT: Normocephalic, atraumatic,mucus membranes moist Respiratory: Normal inspiratory effort Cardiovascular: Normal rate GI: nondistended skin: No rash, skin intact Musculoskeletal: no deformities Neurologic: Alert & oriented x 3, no focal neuro deficits Psychiatric: Speech and behavior appropriate   ED Course   Medications - No data to display  Orders Placed This Encounter  Procedures  . Visual acuity screening    Standing Status:   Standing    Number of Occurrences:   1    No results found for this or any previous visit (from the past 24 hour(s)). No results found.  ED Clinical Impression  Subconjunctival hemorrhage of right eye  ED Assessment/Plan  Patient was in a diffuse subconjunctival hemorrhage, visual acuity is normal. She has some chemosis and some periorbital bruising, but I think that this is from patient rubbing her eye. Doubt periorbital, post-septal cellulitis.  Dr. Wyatt Portela optho on call- disssed case. Will see her in clinic today. She is to go immediately over to their clinic and they will work her in.  No orders of the defined types were placed in this encounter.   *This clinic note was created using Dragon dictation software. Therefore, there may be occasional mistakes despite careful proofreading.  ?   Melynda Ripple, MD 11/19/16 1140

## 2016-11-19 NOTE — ED Triage Notes (Signed)
Patient felt like something in the inner corner of right eye on Friday.  Since then redness and soreness has increased.  Eye is tearing and patient has had cataract surgery in 2015

## 2016-11-22 ENCOUNTER — Ambulatory Visit (INDEPENDENT_AMBULATORY_CARE_PROVIDER_SITE_OTHER): Payer: Medicare Other | Admitting: Cardiovascular Disease

## 2016-11-22 ENCOUNTER — Ambulatory Visit: Payer: Medicare Other | Admitting: Cardiovascular Disease

## 2016-11-22 ENCOUNTER — Encounter: Payer: Self-pay | Admitting: Cardiovascular Disease

## 2016-11-22 VITALS — BP 116/64 | HR 73 | Ht 67.5 in | Wt 212.8 lb

## 2016-11-22 DIAGNOSIS — I10 Essential (primary) hypertension: Secondary | ICD-10-CM

## 2016-11-22 DIAGNOSIS — E7800 Pure hypercholesterolemia, unspecified: Secondary | ICD-10-CM

## 2016-11-22 DIAGNOSIS — M25472 Effusion, left ankle: Secondary | ICD-10-CM | POA: Diagnosis not present

## 2016-11-22 DIAGNOSIS — M25471 Effusion, right ankle: Secondary | ICD-10-CM

## 2016-11-22 NOTE — Progress Notes (Signed)
Cardiology Office Note:    Date:  11/22/2016   ID:  [name], DOB Aug 24, 1945, MRN [id number]  PCP:  Lucille Passy, MD  Cardiologist:  Sanda Klein, MD    Referring MD: Lucille Passy, MD   Chief Complaint  Patient presents with  . Follow-up    pt reports swelling in ankles. has hemorrhaging in right eye - urgent care is holding ASA.  [facility] follow-up. Previously seen by Dr. Debara Pickett and Dr. Terrence Dupont  History of Present Illness:    [name] is a 71 y.o. female with a hx of Minor coronary artery disease by previous cardiac catheterization (20-30% stenosis in proximal-mid LAD, 2005), history of chest radiation therapy for left breast cancer (also mastectomy and chemotherapy), hyperlipidemia, hypertension, fibromyalgia.  She had a sneezing fit a few days ago and has developed a right scleral hemorrhage. She was seen in the emergency room on July 31 and then was seen by her ophthalmologist reassured her that nothing was seriously wrong.  She continues to have mild exertional dyspnea, really not changed from recent past. She denies chest pain at rest or with exertion. She has long-standing mild ankle swelling, always a little worse in the right ankle compared with the left. The swelling is always worse after standing for prolonged periods of time and general resolves after lying in bed. She complains of some tingling in her left arm and hand in a pattern strongly suggestive of compressive radiculopathy.  She denies exertional chest pain, PND, orthopnea, palpitations, dizziness or syncope, other focal neurological complaints, claudication.   She had a normal stress test/nuclear perfusion study in April 2018 with normal perfusion. The EF was borderline at 52%. An echo performed in 2011 showed normal left ventricular size and systolic function, with a similar EF of 50-55 percent. There was no evidence of left ventricular hypertrophy and a left atrial size was normal. There  were no serious valvular abnormalities. Although no specific comment was made about diastolic function, the measured parameters appear to be within normal limits.  Past Medical History:  Diagnosis Date  . Blood transfusion    /w childbirth   . CAD (coronary artery disease)   . Cancer ([place])    L breast - chemotherapy & radiation- 2000  . Colon polyp   . Diverticulitis    2009, while hosp for Diverticulitis, experienced pneumonia   . Fibromyalgia   . Gastritis   . GERD (gastroesophageal reflux disease)   . Hemorrhoids   . Hyperlipidemia   . Hypertension    sees [facility]- thinks last appt. 2012  . Lymph edema    L arm, treatment 2013- PT until recent weeks   . Personal history of radiation therapy 2000  . Pneumonia    2009    Past Surgical History:  Procedure Laterality Date  . ABDOMINAL ADHESION SURGERY    . ABDOMINAL HYSTERECTOMY     bleeding, leiomyomata  . ANTERIOR AND POSTERIOR VAGINAL REPAIR  2010  . APPENDECTOMY    . BREAST RECONSTRUCTION    . BREAST SURGERY     Mastectomy  . CARDIAC CATHETERIZATION     yrs. ago- Dr. Terrence Dupont- [facility]  . ingrown toenails      R foot & hammer toe surgery   . INGUINAL HERNIA REPAIR  09/27/2011   Procedure: HERNIA REPAIR INGUINAL ADULT;  Surgeon: Gwenyth Ober, MD;  Location: [place];  Service: General;  Laterality: Right;  . KNEE SURGERY    . MASTECTOMY Left  2000  . NASAL SINUS SURGERY    . REDUCTION MAMMAPLASTY Right 2000  . ROTATOR CUFF REPAIR    . TONSILLECTOMY     as a child    Current Medications: Current Meds  Medication Sig  . losartan-hydrochlorothiazide (HYZAAR) 100-25 MG tablet Take 1 tablet by mouth daily.  . pantoprazole (PROTONIX) 40 MG tablet Take 1 tablet (40 mg total) by mouth daily.  . Polyethylene Glycol 3350 (MIRALAX PO) Take 17 g by mouth daily as needed. For constipation. Mix with 8 ounces of fluid.  . potassium chloride SA (K-DUR,KLOR-CON) 20 MEQ tablet TAKE 2 TABLETS(40 MEQ) BY MOUTH TWICE DAILY  . pravastatin  (PRAVACHOL) 20 MG tablet Take 1 tablet (20 mg total) by mouth daily.  . Probiotic Product (ALIGN PO) Take 1 tablet by mouth daily.   [name] torsemide (DEMADEX) 20 MG tablet Take 1 tablet (20 mg total) by mouth daily as needed. For fluid     Allergies:   Codeine and Statins   Social History   Social History  . Marital status: Married    Spouse name: N/A  . Number of children: N/A  . Years of education: N/A   Social History Main Topics  . Smoking status: Former Smoker    Quit date: 09/20/1998  . Smokeless tobacco: Never Used  . Alcohol use No  . Drug use: No  . Sexual activity: Yes     Comment: 1st intercourse 71 yo-Fewer than 5 partners   Other Topics Concern  . None   Social History Narrative   Widowed- husband died of Alzheimers in 2013/02/11.     Family History: The patient's family history includes Alzheimer's disease in her mother; Breast cancer (age of onset: 63) in her sister; Cancer in her son; Colon polyps in her sister. There is no history of Anesthesia problems. ROS:   Please see the history of present illness.     All other systems reviewed and are negative.  EKGs/Labs/Other Studies Reviewed:    The following studies were reviewed today: Echo from 2011, nuclear stress test from 2018, notes from Dr. Debara Pickett  EKG:  EKG is  ordered today.  The ekg ordered today demonstrates normal sinus rhythm, normal tracing  Recent Labs: 07/04/2016: Hemoglobin 12.6; Platelets 217.0; TSH 2.43 09/09/2016: ALT 9; BUN 11; Creatinine, Ser 0.87; Potassium 3.3; Sodium 141  Recent Lipid Panel    Component Value Date/Time   CHOL 271 (H) 09/09/2016 0935   TRIG 144.0 09/09/2016 0935   HDL 56.50 09/09/2016 0935   CHOLHDL 5 09/09/2016 0935   VLDL 28.8 09/09/2016 0935   LDLCALC 186 (H) 09/09/2016 0935   LDLDIRECT 189.0 02/21/2015 1016    Physical Exam:    VS:  BP 116/64   Pulse 73   Ht 5' 7.5" (1.715 m)   Wt 212 lb 12.8 oz (96.5 kg)   BMI 32.84 kg/m     Wt Readings from Last 3  Encounters:  11/22/16 212 lb 12.8 oz (96.5 kg)  07/16/16 214 lb (97.1 kg)  07/04/16 211 lb (95.7 kg)     GEN:  Well nourished, well developed in no acute distress HEENT: Normal NECK: No JVD; No carotid bruits LYMPHATICS: No lymphadenopathy CARDIAC: RRR, no murmurs, rubs, gallops. She has 1+ left ankle edema and trivial right ankle edema RESPIRATORY:  Clear to auscultation without rales, wheezing or rhonchi  ABDOMEN: Soft, non-tender, non-distended MUSCULOSKELETAL:  No edema; No deformity  SKIN: Warm and dry NEUROLOGIC:  Alert and oriented x 3 PSYCHIATRIC:  Normal affect   ASSESSMENT:    No diagnosis found. PLAN:    In order of problems listed above:  1. HTN: Excellent control 2. HLP: Unable to tolerate most statins. Stop the Zetia due to cost. Currently on very low dose pravastatin. Recheck labs. Does not have established coronary disease or peripheral vascular disease that has required revascularization, although she did have mild atherosclerosis of previous coronary angiography.  3. Venous insufficiency: Suspect this is the reason for ankle swelling. There is no evidence for heart failure or acute DVT. Recommend compression stockings and elevation of legs. 4. History of left breast radiation therapy for breast cancer, increasing the risk of coronary disease. 5. Right scleral hemorrhage after sneezing   Medication Adjustments/Labs and Tests Ordered: Current medicines are reviewed at length with the patient today.  Concerns regarding medicines are outlined above.  No orders of the defined types were placed in this encounter.  No orders of the defined types were placed in this encounter.   Signed, Sanda Klein, MD  11/22/2016 11:23 AM    [facility]

## 2016-11-22 NOTE — Patient Instructions (Signed)
Dr Sallyanne Kuster recommends that you schedule a follow-up appointment in 12 months. You will receive a reminder letter in the mail two months in advance. If you don't receive a letter, please call our office to schedule the follow-up appointment.  If you need a refill on your cardiac medications before your next appointment, please call your pharmacy.   Compression Stockings Compression stockings are elastic stockings that "compress" your legs. This helps to increase blood flow, decrease swelling, and reduces the chance of getting blood clots in your lower legs. Compression stockings are used:  After surgery.  If you have a history of poor circulation.  If you are prone to blood clots.  If you have varicose veins.  If you sit or are bedridden for long periods of time. WEARING COMPRESSION STOCKINGS  Your compression stockings should be worn as instructed by your caregiver.  Wearing the correct stocking size is important. Your caregiver can help measure and fit you to the correct size.  When wearing your stockings, do not allow the stockings to bunch up. This is especially important around your toes or behind your knees. Keep the stockings as smooth as possible.  Do not roll the stockings downward and leave them rolled down. This can form a restrictive band around your legs and can decrease blood flow.  The stockings should be removed once a day for 1 hour or as instructed by your caregiver. When the stockings are taken off, inspect your legs and feet. Look for:  Open sores.  Red spots.  Puffy areas (swelling).  Anything that does not seem normal. IMPORTANT INFORMATION ABOUT COMPRESSION STOCKINGS  The compression stockings should be clean, dry, and in good condition before you put them on.  Do not put lotion on your legs or feet. This makes it harder to put the stockings on.  Change your stockings immediately if they become wet or soiled.  Do not wear stockings that are ripped  or torn.  You may hand-wash or put your stockings in the washing machine. Use cold or warm water with mild detergent. Do not bleach your stockings. They may be air-dried or dried in the dryer on low heat.  If you have pain or have a feeling of "pins and needles" in your feet or legs, you may be wearing stockings that are too tight. Call your caregiver right away. SEEK IMMEDIATE MEDICAL CARE IF:   You have numbness or tingling in your lower legs that does not get better quickly after the stockings are removed.  Your toes or feet become cold and blue.  You develop open sores or have red spots on your legs that do not go away. MAKE SURE YOU:   Understand these instructions.  Will watch your condition.  Will get help right away if you are not doing well or get worse. Document Released: 02/03/2009 Document Revised: 07/01/2011 Document Reviewed: 02/03/2009 [facility] Patient Information 2014 [place], [place].

## 2016-12-31 ENCOUNTER — Other Ambulatory Visit: Payer: Self-pay | Admitting: Family Medicine

## 2016-12-31 DIAGNOSIS — I10 Essential (primary) hypertension: Secondary | ICD-10-CM

## 2016-12-31 DIAGNOSIS — R5383 Other fatigue: Secondary | ICD-10-CM

## 2016-12-31 DIAGNOSIS — E7800 Pure hypercholesterolemia, unspecified: Secondary | ICD-10-CM

## 2017-01-02 ENCOUNTER — Ambulatory Visit (INDEPENDENT_AMBULATORY_CARE_PROVIDER_SITE_OTHER): Payer: Medicare Other

## 2017-01-02 VITALS — BP 110/62 | HR 58 | Temp 98.4°F | Ht 68.25 in | Wt 212.5 lb

## 2017-01-02 DIAGNOSIS — R5383 Other fatigue: Secondary | ICD-10-CM | POA: Diagnosis not present

## 2017-01-02 DIAGNOSIS — E7800 Pure hypercholesterolemia, unspecified: Secondary | ICD-10-CM | POA: Diagnosis not present

## 2017-01-02 DIAGNOSIS — Z0000 Encounter for general adult medical examination without abnormal findings: Secondary | ICD-10-CM | POA: Diagnosis not present

## 2017-01-02 LAB — COMPREHENSIVE METABOLIC PANEL
ALK PHOS: 72 U/L (ref 39–117)
ALT: 7 U/L (ref 0–35)
AST: 14 U/L (ref 0–37)
Albumin: 4.2 g/dL (ref 3.5–5.2)
BUN: 9 mg/dL (ref 6–23)
CO2: 33 mEq/L — ABNORMAL HIGH (ref 19–32)
CREATININE: 0.89 mg/dL (ref 0.40–1.20)
Calcium: 10.5 mg/dL (ref 8.4–10.5)
Chloride: 103 mEq/L (ref 96–112)
GFR: 80.25 mL/min (ref >60.00)
GLUCOSE: 117 mg/dL — AB (ref 70–99)
POTASSIUM: 4.6 meq/L (ref 3.5–5.1)
SODIUM: 143 meq/L (ref 135–145)
TOTAL PROTEIN: 6.7 g/dL (ref 6.0–8.3)
Total Bilirubin: 0.4 mg/dL (ref 0.2–1.2)

## 2017-01-02 LAB — CBC WITH DIFFERENTIAL/PLATELET
BASOS ABS: 0 10*3/uL (ref 0.0–0.1)
Basophils Relative: 0.6 % (ref 0.0–3.0)
EOS ABS: 0.3 10*3/uL (ref 0.0–0.7)
Eosinophils Relative: 5.8 % — ABNORMAL HIGH (ref 0.0–5.0)
HCT: 41.2 % (ref 36.0–46.0)
Hemoglobin: 12.9 g/dL (ref 12.0–15.0)
LYMPHS ABS: 1.8 10*3/uL (ref 0.7–4.0)
Lymphocytes Relative: 35.5 % (ref 12.0–46.0)
MCHC: 31.4 g/dL (ref 30.0–36.0)
MCV: 82.5 fl (ref 78.0–100.0)
MONO ABS: 0.5 10*3/uL (ref 0.1–1.0)
Monocytes Relative: 9.4 % (ref 3.0–12.0)
NEUTROS PCT: 48.7 % (ref 43.0–77.0)
Neutro Abs: 2.5 10*3/uL (ref 1.4–7.7)
Platelets: 240 10*3/uL (ref 150.0–400.0)
RBC: 5 Mil/uL (ref 3.87–5.11)
RDW: 14.1 % (ref 11.5–15.5)
WBC: 5.1 10*3/uL (ref 4.0–10.5)

## 2017-01-02 LAB — LIPID PANEL
Cholesterol: 287 mg/dL — ABNORMAL HIGH (ref 0–200)
HDL: 77.7 mg/dL (ref >39.00)
LDL Cholesterol: 176 mg/dL — ABNORMAL HIGH (ref 0–99)
NONHDL: 208.88
Total CHOL/HDL Ratio: 4
Triglycerides: 165 mg/dL — ABNORMAL HIGH (ref 0.0–149.0)
VLDL: 33 mg/dL (ref 0.0–40.0)

## 2017-01-02 LAB — TSH: TSH: 3.71 u[IU]/mL (ref 0.35–4.50)

## 2017-01-02 NOTE — Progress Notes (Signed)
Pre visit review using our clinic review tool, if applicable. No additional management support is needed unless otherwise documented below in the visit note. 

## 2017-01-02 NOTE — Patient Instructions (Addendum)
[name] , Thank you for taking time to come for your Medicare Wellness Visit. I appreciate your ongoing commitment to your health goals. Please review the following plan we discussed and let me know if I can assist you in the future.   These are the goals we discussed: Goals    . Increase physical activity          Starting 01/02/2017, I will continue to exercise for at 60 min twice weekly.        This is a list of the screening recommended for you and due dates:  Health Maintenance  Topic Date Due  . Flu Shot  07/20/2017*  . Pneumonia vaccines (2 of 2 - PPSV23) 07/20/2017*  . Tetanus Vaccine  01/03/2027*  . Mammogram  09/25/2018  . Colon Cancer Screening  07/03/2017  . DEXA scan (bone density measurement)  Completed  .  Hepatitis C: One time screening is recommended by Center for Disease Control  (CDC) for  adults born from [birth year] through 1965.   Completed  *Topic was postponed. The date shown is not the original due date.   Preventive Care for Adults  A healthy lifestyle and preventive care can promote health and wellness. Preventive health guidelines for adults include the following key practices.  . A routine yearly physical is a good way to check with your health care provider about your health and preventive screening. It is a chance to share any concerns and updates on your health and to receive a thorough exam.  . Visit your dentist for a routine exam and preventive care every 6 months. Brush your teeth twice a day and floss once a day. Good oral hygiene prevents tooth decay and gum disease.  . The frequency of eye exams is based on your age, health, family medical history, use  of contact lenses, and other factors. Follow your health care provider's ecommendations for frequency of eye exams.  . Eat a healthy diet. Foods like vegetables, fruits, whole grains, low-fat dairy products, and lean protein foods contain the nutrients you need without too many calories. Decrease  your intake of foods high in solid fats, added sugars, and salt. Eat the right amount of calories for you. Get information about a proper diet from your health care provider, if necessary.  . Regular physical exercise is one of the most important things you can do for your health. Most adults should get at least 150 minutes of moderate-intensity exercise (any activity that increases your heart rate and causes you to sweat) each week. In addition, most adults need muscle-strengthening exercises on 2 or more days a week.  Silver Sneakers may be a benefit available to you. To determine eligibility, you may visit the website: www.silversneakers.com or contact program at (321)452-6792 Mon-Fri between 8AM-8PM.   . Maintain a healthy weight. The body mass index (BMI) is a screening tool to identify possible weight problems. It provides an estimate of body fat based on height and weight. Your health care provider can find your BMI and can help you achieve or maintain a healthy weight.   For adults 20 years and older: ? A BMI below 18.5 is considered underweight. ? A BMI of 18.5 to 24.9 is normal. ? A BMI of 25 to 29.9 is considered overweight. ? A BMI of 30 and above is considered obese.   . Maintain normal blood lipids and cholesterol levels by exercising and minimizing your intake of saturated fat. Eat a balanced diet with plenty  of fruit and vegetables. Blood tests for lipids and cholesterol should begin at age 36 and be repeated every 5 years. If your lipid or cholesterol levels are high, you are over 50, or you are at high risk for heart disease, you may need your cholesterol levels checked more frequently. Ongoing high lipid and cholesterol levels should be treated with medicines if diet and exercise are not working.  . If you smoke, find out from your health care provider how to quit. If you do not use tobacco, please do not start.  . If you choose to drink alcohol, please do not consume more than  2 drinks per day. One drink is considered to be 12 ounces (355 mL) of beer, 5 ounces (148 mL) of wine, or 1.5 ounces (44 mL) of liquor.  . If you are 55-47 years old, ask your health care provider if you should take aspirin to prevent strokes.  . Use sunscreen. Apply sunscreen liberally and repeatedly throughout the day. You should seek shade when your shadow is shorter than you. Protect yourself by wearing long sleeves, pants, a wide-brimmed hat, and sunglasses year round, whenever you are outdoors.  . Once a month, do a whole body skin exam, using a mirror to look at the skin on your back. Tell your health care provider of new moles, moles that have irregular borders, moles that are larger than a pencil eraser, or moles that have changed in shape or color.

## 2017-01-06 NOTE — Progress Notes (Signed)
I reviewed health advisor's note, was available for consultation, and agree with documentation and plan.  

## 2017-01-06 NOTE — Progress Notes (Signed)
PCP notes:   Health maintenance:  Flu vaccine - addressed PPSV23 - addressed Tetanus - postponed/insurance  Abnormal screenings:   Hearing - failed  Hearing Screening   125Hz  250Hz  500Hz  1000Hz  2000Hz  3000Hz  4000Hz  6000Hz  8000Hz   Right ear:   0 0 40  40    Left ear:   40 40 40  40     Mini-Cog score: 19/20  Patient concerns:   None  Nurse concerns:  None  Next PCP appt:   Patient declined to schedule CPE at this time.

## 2017-01-06 NOTE — Progress Notes (Signed)
Subjective:   [name] is a 71 y.o. female who presents for Medicare Annual (Subsequent) preventive examination.  Review of Systems:  N/A Cardiac Risk Factors include: advanced age (>46men, >53 women);obesity (BMI >30kg/m2);dyslipidemia;hypertension     Objective:     Vitals: BP 110/62 (BP Location: Right Arm, Patient Position: Sitting, Cuff Size: Large)   Pulse (!) 58   Temp 98.4 F (36.9 C) (Oral)   Ht 5' 8.25" (1.734 m) Comment: no shoes  Wt 212 lb 8 oz (96.4 kg)   SpO2 94%   BMI 32.07 kg/m   Body mass index is 32.07 kg/m.   Tobacco History  Smoking Status  . Former Smoker  . Quit date: 09/20/1998  Smokeless Tobacco  . Never Used     Counseling given: No   Past Medical History:  Diagnosis Date  . Blood transfusion    /w childbirth   . CAD (coronary artery disease)   . Cancer ([place])    L breast - chemotherapy & radiation- 2000  . Colon polyp   . Diverticulitis    2009, while hosp for Diverticulitis, experienced pneumonia   . Fibromyalgia   . Gastritis   . GERD (gastroesophageal reflux disease)   . Hemorrhoids   . Hyperlipidemia   . Hypertension    sees [facility]- thinks last appt. 2012  . Lymph edema    L arm, treatment 2013- PT until recent weeks   . Personal history of radiation therapy 2000  . Pneumonia    2009   Past Surgical History:  Procedure Laterality Date  . ABDOMINAL ADHESION SURGERY    . ABDOMINAL HYSTERECTOMY     bleeding, leiomyomata  . ANTERIOR AND POSTERIOR VAGINAL REPAIR  2010  . APPENDECTOMY    . BREAST RECONSTRUCTION    . BREAST SURGERY     Mastectomy  . CARDIAC CATHETERIZATION     yrs. ago- Dr. Terrence Dupont- [facility]  . ingrown toenails      R foot & hammer toe surgery   . INGUINAL HERNIA REPAIR  09/27/2011   Procedure: HERNIA REPAIR INGUINAL ADULT;  Surgeon: Gwenyth Ober, MD;  Location: [place];  Service: General;  Laterality: Right;  . KNEE SURGERY    . MASTECTOMY Left 2000  . NASAL SINUS SURGERY    . REDUCTION MAMMAPLASTY  Right 2000  . ROTATOR CUFF REPAIR    . TONSILLECTOMY     as a child   Family History  Problem Relation Age of Onset  . Alzheimer's disease Mother   . Breast cancer Sister 69  . Colon polyps Sister   . Cancer Son        prostate  . Anesthesia problems Neg Hx    History  Sexual Activity  . Sexual activity: Yes    Comment: 1st intercourse 71 yo-Fewer than 5 partners    Outpatient Encounter Prescriptions as of 01/02/2017  Medication Sig  . losartan-hydrochlorothiazide (HYZAAR) 100-25 MG tablet Take 1 tablet by mouth daily.  . pantoprazole (PROTONIX) 40 MG tablet Take 1 tablet (40 mg total) by mouth daily.  . Polyethylene Glycol 3350 (MIRALAX PO) Take 17 g by mouth daily as needed. For constipation. Mix with 8 ounces of fluid.  . potassium chloride SA (K-DUR,KLOR-CON) 20 MEQ tablet TAKE 2 TABLETS(40 MEQ) BY MOUTH TWICE DAILY  . pravastatin (PRAVACHOL) 20 MG tablet Take 1 tablet (20 mg total) by mouth daily.  . Probiotic Product (ALIGN PO) Take 1 tablet by mouth daily.   [name] torsemide (DEMADEX)  20 MG tablet Take 1 tablet (20 mg total) by mouth daily as needed. For fluid   No facility-administered encounter medications on file as of 01/02/2017.     Activities of Daily Living In your present state of health, do you have any difficulty performing the following activities: 01/02/2017  Hearing? N  Vision? Y  Comment hemorrhage to right eye  Difficulty concentrating or making decisions? N  Walking or climbing stairs? N  Dressing or bathing? N  Doing errands, shopping? N  Preparing Food and eating ? N  Using the Toilet? N  In the past six months, have you accidently leaked urine? N  Do you have problems with loss of bowel control? N  Managing your Medications? N  Managing your Finances? N  Housekeeping or managing your Housekeeping? N  Some recent data might be hidden    Patient Care Team: Lucille Passy, MD as PCP - General (Family Medicine) Phineas Real, Belinda Block, MD as Consulting  Physician (Gynecology) Magrinat, Virgie Dad, MD as Consulting Physician (Oncology) Juanita Craver, MD as Consulting Physician (Gastroenterology) Charolette Forward, MD as Consulting Physician (Cardiology)    Assessment:     Hearing Screening   125Hz  250Hz  500Hz  1000Hz  2000Hz  3000Hz  4000Hz  6000Hz  8000Hz   Right ear:   0 0 40  40    Left ear:   40 40 40  40      Visual Acuity Screening   Right eye Left eye Both eyes  Without correction:     With correction: 20/40-1 20/25 20/25    Exercise Activities and Dietary recommendations Current Exercise Habits: Home exercise routine, Type of exercise: walking;Other - see comments (elliptical), Time (Minutes): 60, Frequency (Times/Week): 2, Weekly Exercise (Minutes/Week): 120, Intensity: Moderate, Exercise limited by: None identified  Goals    . Increase physical activity          Starting 01/02/2017, I will continue to exercise for at 60 min twice weekly.       Fall Risk Fall Risk  01/02/2017 02/21/2015  Falls in the past year? No Yes  Number falls in past yr: - 1  Injury with Fall? - No   Depression Screen PHQ 2/9 Scores 01/02/2017 02/21/2015  PHQ - 2 Score 0 0  PHQ- 9 Score 0 -     Cognitive Function MMSE - Mini Mental State Exam 01/02/2017  Orientation to time 5  Orientation to Place 5  Registration 3  Attention/ Calculation 0  Recall 2  Recall-comments pt was unable to recall 1 of 3 words  Language- name 2 objects 0  Language- repeat 1  Language- follow 3 step command 3  Language- read & follow direction 0  Write a sentence 0  Copy design 0  Total score 19       PLEASE NOTE: A Mini-Cog screen was completed. Maximum score is 20. A value of 0 denotes this part of Folstein MMSE was not completed or the patient failed this part of the Mini-Cog screening.   Mini-Cog Screening Orientation to Time - Max 5 pts Orientation to Place - Max 5 pts Registration - Max 3 pts Recall - Max 3 pts Language Repeat - Max 1 pts Language Follow 3  Step Command - Max 3 pts   Immunization History  Administered Date(s) Administered  . Influenza,inj,Quad PF,6+ Mos 02/23/2013, 02/02/2015, 01/26/2016  . Pneumococcal Conjugate-13 02/21/2015   Screening Tests Health Maintenance  Topic Date Due  . INFLUENZA VACCINE  07/20/2017 (Originally 11/20/2016)  . PNA vac Low Risk  Adult (2 of 2 - PPSV23) 07/20/2017 (Originally 02/21/2016)  . TETANUS/TDAP  01/03/2027 (Originally 05/02/1964)  . COLONOSCOPY  07/03/2017  . MAMMOGRAM  09/25/2018  . DEXA SCAN  Completed  . Hepatitis C Screening  Completed      Plan:     I have personally reviewed and addressed the Medicare Annual Wellness questionnaire and have noted the following in the patient's chart:  A. Medical and social history B. Use of alcohol, tobacco or illicit drugs  C. Current medications and supplements D. Functional ability and status E.  Nutritional status F.  Physical activity G. Advance directives H. List of other physicians I.  Hospitalizations, surgeries, and ER visits in previous 12 months J.  [place] to include hearing, vision, cognitive, depression L. Referrals and appointments - none  In addition, I have reviewed and discussed with patient certain preventive protocols, quality metrics, and best practice recommendations. A written personalized care plan for preventive services as well as general preventive health recommendations were provided to patient.  See attached scanned questionnaire for additional information.   Signed,   Lindell Noe, MHA, BS, LPN Health Coach

## 2017-01-08 DIAGNOSIS — H04123 Dry eye syndrome of bilateral lacrimal glands: Secondary | ICD-10-CM | POA: Diagnosis not present

## 2017-01-08 DIAGNOSIS — H35373 Puckering of macula, bilateral: Secondary | ICD-10-CM | POA: Diagnosis not present

## 2017-01-08 DIAGNOSIS — Z961 Presence of intraocular lens: Secondary | ICD-10-CM | POA: Diagnosis not present

## 2017-02-07 DIAGNOSIS — H04123 Dry eye syndrome of bilateral lacrimal glands: Secondary | ICD-10-CM | POA: Diagnosis not present

## 2017-02-07 DIAGNOSIS — H35373 Puckering of macula, bilateral: Secondary | ICD-10-CM | POA: Diagnosis not present

## 2017-02-07 DIAGNOSIS — Z961 Presence of intraocular lens: Secondary | ICD-10-CM | POA: Diagnosis not present

## 2017-02-11 DIAGNOSIS — Z23 Encounter for immunization: Secondary | ICD-10-CM | POA: Diagnosis not present

## 2017-07-03 ENCOUNTER — Ambulatory Visit (INDEPENDENT_AMBULATORY_CARE_PROVIDER_SITE_OTHER): Payer: Medicare Other | Admitting: Family Medicine

## 2017-07-03 ENCOUNTER — Encounter: Payer: Self-pay | Admitting: Family Medicine

## 2017-07-03 VITALS — BP 116/70 | HR 77 | Temp 98.4°F | Ht 67.5 in | Wt 204.0 lb

## 2017-07-03 DIAGNOSIS — I10 Essential (primary) hypertension: Secondary | ICD-10-CM

## 2017-07-03 DIAGNOSIS — E876 Hypokalemia: Secondary | ICD-10-CM | POA: Diagnosis not present

## 2017-07-03 MED ORDER — POTASSIUM CHLORIDE CRYS ER 20 MEQ PO TBCR: 20.0000 meq | EXTENDED_RELEASE_TABLET | Freq: Every day | ORAL | 3 refills | Status: DC

## 2017-07-03 MED ORDER — LOSARTAN POTASSIUM 100 MG PO TABS: 100.0000 mg | ORAL_TABLET | Freq: Every day | ORAL | 3 refills | Status: DC

## 2017-07-03 NOTE — Assessment & Plan Note (Signed)
Lower potassium to 20 meq daily now that we have d/c'd hctz.  Based on labs in 2 weeks, we may d/c supplemental potassium. The patient indicates understanding of these issues and agrees with the plan.

## 2017-07-03 NOTE — Patient Instructions (Addendum)
Great to see you. STOP taking Hyzaar. Start taking losartan 100 mg daily. Decrease your potassium to 20 meq daily.  Come see me in 2 weeks.  Make an appointment Dr. Raeford Razor here.

## 2017-07-03 NOTE — Assessment & Plan Note (Signed)
Well controlled- discussed what I have written in HPI about studies concerning HCTZ.  She would like to a trial off of it. Will d/c hyzaar.  Start losartan 100 mg daily.   Follow up in 2 weeks.

## 2017-07-03 NOTE — Progress Notes (Signed)
Subjective:   Patient ID: [name], female    DOB: 1945-10-15, 72 y.o.   MRN: [id number]  [name] is a pleasant 72 y.o. year old female who presents to clinic today with Hypertension (Patient is here today to discuss HTN.  She is concerned about the Losartan with the warnings that it can cause cancer.)  on 07/03/2017  HPI:  HTN- Has been well controlled with Hyzaar.  She is concerned about warnings about Hyzaar causing cancer. BP Readings from Last 3 Encounters:  07/03/17 116/70  01/02/17 110/62  11/22/16 116/64   Hydrochlorothiazide Safety Alert January 2019  [facility] [place] has concluded that prolonged use (3 years or more) of hydrochlorothiazide may increase the risk for squamous cell carcinoma up to 4 times and increase the risk for basal cell carcinoma up to 1.25 times compared to the risk in patients not treated with hydrochlorothiazide. [facility] [place]'s safety review was prompted by 2 recent studies suggesting a higher risk of non-melanoma skin cancer with prolonged use (3 years or more) of hydrochlorothiazide. [facility] [place] is requiring manufacturers to update the product safety information for all hydrochlorothiazide-containing products.  She does have rx for demadex to take as needed for edema but she rarely uses this as hyzaar seems to control this. Also take Kclor 20 meq twice daily.   Current Outpatient Medications on File Prior to Visit  Medication Sig Dispense Refill  . pantoprazole (PROTONIX) 40 MG tablet Take 1 tablet (40 mg total) by mouth daily. 30 tablet 11  . Polyethylene Glycol 3350 (MIRALAX PO) Take 17 g by mouth daily as needed. For constipation. Mix with 8 ounces of fluid.    . pravastatin (PRAVACHOL) 20 MG tablet Take 1 tablet (20 mg total) by mouth daily. 90 tablet 3  . Probiotic Product (ALIGN PO) Take 1 tablet by mouth daily.     [name] torsemide (DEMADEX) 20 MG tablet Take 1 tablet (20 mg total) by mouth daily as needed. For fluid 30 tablet 5    No current facility-administered medications on file prior to visit.     Allergies  Allergen Reactions  . Codeine Itching  . Statins Other (See Comments)    Muscle aches    Past Medical History:  Diagnosis Date  . Blood transfusion    /w childbirth   . CAD (coronary artery disease)   . Cancer ([place])    L breast - chemotherapy & radiation- 2000  . Colon polyp   . Diverticulitis    2009, while hosp for Diverticulitis, experienced pneumonia   . Fibromyalgia   . Gastritis   . GERD (gastroesophageal reflux disease)   . Hemorrhoids   . Hyperlipidemia   . Hypertension    sees [facility]- thinks last appt. 2012  . Lymph edema    L arm, treatment 2013- PT until recent weeks   . Personal history of radiation therapy 2000  . Pneumonia    2009    Past Surgical History:  Procedure Laterality Date  . ABDOMINAL ADHESION SURGERY    . ABDOMINAL HYSTERECTOMY     bleeding, leiomyomata  . ANTERIOR AND POSTERIOR VAGINAL REPAIR  2010  . APPENDECTOMY    . BREAST RECONSTRUCTION    . BREAST SURGERY     Mastectomy  . CARDIAC CATHETERIZATION     yrs. ago- Dr. Terrence Dupont- [facility]  . ingrown toenails      R foot & hammer toe surgery   . INGUINAL HERNIA REPAIR  09/27/2011   Procedure: HERNIA  REPAIR INGUINAL ADULT;  Surgeon: Gwenyth Ober, MD;  Location: [place];  Service: General;  Laterality: Right;  . KNEE SURGERY    . MASTECTOMY Left 2000  . NASAL SINUS SURGERY    . REDUCTION MAMMAPLASTY Right 2000  . ROTATOR CUFF REPAIR    . TONSILLECTOMY     as a child    Family History  Problem Relation Age of Onset  . Alzheimer's disease Mother   . Breast cancer Sister 83  . Colon polyps Sister   . Cancer Son        prostate  . Anesthesia problems Neg Hx     Social History   Socioeconomic History  . Marital status: Married    Spouse name: Not on file  . Number of children: Not on file  . Years of education: Not on file  . Highest education level: Not on file  Social Needs  . Financial  resource strain: Not on file  . Food insecurity - worry: Not on file  . Food insecurity - inability: Not on file  . Transportation needs - medical: Not on file  . Transportation needs - non-medical: Not on file  Occupational History  . Not on file  Tobacco Use  . Smoking status: Former Smoker    Last attempt to quit: 09/20/1998    Years since quitting: 18.7  . Smokeless tobacco: Never Used  Substance and Sexual Activity  . Alcohol use: No    Alcohol/week: 0.0 oz  . Drug use: No  . Sexual activity: Yes    Comment: 1st intercourse 72 yo-Fewer than 5 partners  Other Topics Concern  . Not on file  Social History Narrative   Widowed- husband died of Alzheimers in 16-Feb-2013.   The PMH, PSH, Social History, Family History, Medications, and allergies have been reviewed in [facility], and have been updated if relevant.   Review of Systems  Constitutional: Negative.   Respiratory: Negative.   Cardiovascular: Negative.   Gastrointestinal: Negative.   Musculoskeletal: Negative.   Neurological: Negative.   Hematological: Negative.   Psychiatric/Behavioral: Negative.   All other systems reviewed and are negative.      Objective:    BP 116/70 (BP Location: Right Arm, Patient Position: Sitting, Cuff Size: Normal)   Pulse 77   Temp 98.4 F (36.9 C) (Oral)   Ht 5' 7.5" (1.715 m)   Wt 204 lb (92.5 kg)   SpO2 98%   BMI 31.48 kg/m    Physical Exam  Constitutional: She is oriented to person, place, and time. She appears well-developed and well-nourished. No distress.  HENT:  Head: Normocephalic and atraumatic.  Eyes: Conjunctivae are normal.  Neck: Normal range of motion.  Cardiovascular: Normal rate and regular rhythm.  Pulmonary/Chest: Effort normal and breath sounds normal.  Musculoskeletal: Normal range of motion. She exhibits no edema.  Neurological: She is alert and oriented to person, place, and time. No cranial nerve deficit.  Skin: Skin is warm and dry. She is not diaphoretic.   Psychiatric: She has a normal mood and affect. Her behavior is normal. Judgment and thought content normal.  Nursing note and vitals reviewed.         Assessment & Plan:   Essential hypertension  Hypokalemia No Follow-up on file.

## 2017-07-06 ENCOUNTER — Other Ambulatory Visit: Payer: Self-pay | Admitting: Family Medicine

## 2017-07-07 ENCOUNTER — Ambulatory Visit (INDEPENDENT_AMBULATORY_CARE_PROVIDER_SITE_OTHER): Payer: Medicare Other | Admitting: Family Medicine

## 2017-07-07 ENCOUNTER — Encounter: Payer: Self-pay | Admitting: Family Medicine

## 2017-07-07 VITALS — BP 138/82 | HR 68 | Temp 98.5°F | Ht 67.75 in | Wt 205.0 lb

## 2017-07-07 DIAGNOSIS — M25552 Pain in left hip: Secondary | ICD-10-CM

## 2017-07-07 DIAGNOSIS — M5412 Radiculopathy, cervical region: Secondary | ICD-10-CM

## 2017-07-07 MED ORDER — PREDNISONE 5 MG PO TABS
ORAL_TABLET | ORAL | 0 refills | Status: DC
Start: 2017-07-07 — End: 2017-07-23

## 2017-07-07 NOTE — Patient Instructions (Signed)
Please try the exercises  Please try the prednisone  Please follow up with me in 4-6 weeks if you don't have any improvement and we could try different injections or physical therapy.

## 2017-07-07 NOTE — Assessment & Plan Note (Signed)
Her rotator cuff seems strong. This would more likely be coming down from her neck.  - prednisone  - counseled on HEP  - if no improvement consider gabapentin, imaging or PT

## 2017-07-07 NOTE — Progress Notes (Signed)
[name] - 72 y.o. female MRN [id number]  Date of birth: 18-Nov-1945  SUBJECTIVE:  Including CC & ROS.  Chief Complaint  Patient presents with  . Left shoulder pain and Hip    [name] is a 72 y.o. female that is presenting with left shoulder and hip pain.   Left shoulder pain is worse with flexion. Located in the anterior aspect. Admits to tingling and numbness. Denies injury or surgeries. She has been taking motrin for the pain. She has received injections into her rotator cuff before. Reports symptoms that go down her arm into her hand.    Left hip pain is on the lateral aspect of the greater trochanter. Has been present for one month. Has some radiation distally on the lateral aspect. Denies any injury or surgery. Nothing has improved her pain. Pain is intermittent in nature. Pain is sharp and moderate. Has received injections into her greater trochanter bursa before.    Independent review of the left hip xray from 2015 shows mild degenerative changes of the joint.    Review of Systems  Constitutional: Negative for fever.  HENT: Negative for congestion.   Respiratory: Negative for cough.   Cardiovascular: Negative for chest pain.  Gastrointestinal: Negative for abdominal pain.  Musculoskeletal: Positive for myalgias.  Skin: Negative for color change.  Neurological: Negative for weakness.  Hematological: Negative for adenopathy.  Psychiatric/Behavioral: Negative for agitation.    HISTORY: Past Medical, Surgical, Social, and Family History Reviewed & Updated per EMR.   Pertinent Historical Findings include:  Past Medical History:  Diagnosis Date  . Blood transfusion    /w childbirth   . CAD (coronary artery disease)   . Cancer ([place])    L breast - chemotherapy & radiation- 2000  . Colon polyp   . Diverticulitis    2009, while hosp for Diverticulitis, experienced pneumonia   . Fibromyalgia   . Gastritis   . GERD (gastroesophageal reflux disease)   .  Hemorrhoids   . Hyperlipidemia   . Hypertension    sees [facility]- thinks last appt. 2012  . Lymph edema    L arm, treatment 2013- PT until recent weeks   . Personal history of radiation therapy 2000  . Pneumonia    2009    Past Surgical History:  Procedure Laterality Date  . ABDOMINAL ADHESION SURGERY    . ABDOMINAL HYSTERECTOMY     bleeding, leiomyomata  . ANTERIOR AND POSTERIOR VAGINAL REPAIR  2010  . APPENDECTOMY    . BREAST RECONSTRUCTION    . BREAST SURGERY     Mastectomy  . CARDIAC CATHETERIZATION     yrs. ago- Dr. Terrence Dupont- [facility]  . ingrown toenails      R foot & hammer toe surgery   . INGUINAL HERNIA REPAIR  09/27/2011   Procedure: HERNIA REPAIR INGUINAL ADULT;  Surgeon: Gwenyth Ober, MD;  Location: [place];  Service: General;  Laterality: Right;  . KNEE SURGERY    . MASTECTOMY Left 2000  . NASAL SINUS SURGERY    . REDUCTION MAMMAPLASTY Right 2000  . ROTATOR CUFF REPAIR    . TONSILLECTOMY     as a child    Allergies  Allergen Reactions  . Codeine Itching  . Statins Other (See Comments)    Muscle aches    Family History  Problem Relation Age of Onset  . Alzheimer's disease Mother   . Breast cancer Sister 70  . Colon polyps Sister   . Cancer Son  prostate  . Anesthesia problems Neg Hx      Social History   Socioeconomic History  . Marital status: Married    Spouse name: Not on file  . Number of children: Not on file  . Years of education: Not on file  . Highest education level: Not on file  Social Needs  . Financial resource strain: Not on file  . Food insecurity - worry: Not on file  . Food insecurity - inability: Not on file  . Transportation needs - medical: Not on file  . Transportation needs - non-medical: Not on file  Occupational History  . Not on file  Tobacco Use  . Smoking status: Former Smoker    Last attempt to quit: 09/20/1998    Years since quitting: 18.8  . Smokeless tobacco: Never Used  Substance and Sexual Activity  .  Alcohol use: No    Alcohol/week: 0.0 oz  . Drug use: No  . Sexual activity: Yes    Comment: 1st intercourse 72 yo-Fewer than 5 partners  Other Topics Concern  . Not on file  Social History Narrative   Widowed- husband died of Alzheimers in 02-26-2013.     PHYSICAL EXAM:  VS: BP 138/82 (BP Location: Right Arm, Patient Position: Sitting, Cuff Size: Normal)   Pulse 68   Temp 98.5 F (36.9 C) (Oral)   Ht 5' 7.75" (1.721 m)   Wt 205 lb (93 kg)   SpO2 100%   BMI 31.40 kg/m  Physical Exam Gen: NAD, alert, cooperative with exam, well-appearing ENT: normal lips, normal nasal mucosa,  Eye: normal EOM, normal conjunctiva and lids CV:  no edema, +2 pedal pulses   Resp: no accessory muscle use, non-labored,  Skin: no rashes, no areas of induration  Neuro: normal tone, normal sensation to touch Psych:  normal insight, alert and oriented MSK:  Left hip:  TTP of the GT  Normal IR and ER  Normal strength to hip flexion  Normal knee flexion and extension  Normal gait  Left shoulder:  Normal active flexion and abduction  Normal ER  Normal strength to resistance with IR and ER  Normal empty can and Hawkin's testing  Neurovascularly intact.      ASSESSMENT & PLAN:   Left hip pain Symptoms seem to represent greater trochanteric bursitis. Possible for lumbar radiculopathy  - prednisone  - counseled on HEP  - if no improvement consider injection and/or imaging or PT   Cervical radiculopathy Her rotator cuff seems strong. This would more likely be coming down from her neck.  - prednisone  - counseled on HEP  - if no improvement consider gabapentin, imaging or PT

## 2017-07-07 NOTE — Assessment & Plan Note (Signed)
Symptoms seem to represent greater trochanteric bursitis. Possible for lumbar radiculopathy  - prednisone  - counseled on HEP  - if no improvement consider injection and/or imaging or PT

## 2017-07-17 ENCOUNTER — Ambulatory Visit (INDEPENDENT_AMBULATORY_CARE_PROVIDER_SITE_OTHER): Payer: Medicare Other | Admitting: Family Medicine

## 2017-07-17 ENCOUNTER — Encounter: Payer: Self-pay | Admitting: Family Medicine

## 2017-07-17 VITALS — BP 112/66 | HR 76 | Temp 98.0°F | Ht 67.75 in | Wt 206.8 lb

## 2017-07-17 DIAGNOSIS — I10 Essential (primary) hypertension: Secondary | ICD-10-CM | POA: Diagnosis not present

## 2017-07-17 DIAGNOSIS — E876 Hypokalemia: Secondary | ICD-10-CM

## 2017-07-17 DIAGNOSIS — R609 Edema, unspecified: Secondary | ICD-10-CM | POA: Diagnosis not present

## 2017-07-17 DIAGNOSIS — Z23 Encounter for immunization: Secondary | ICD-10-CM | POA: Diagnosis not present

## 2017-07-17 MED ORDER — FUROSEMIDE 20 MG PO TABS: 20.0000 mg | ORAL_TABLET | ORAL | 3 refills | Status: DC | PRN

## 2017-07-17 NOTE — Assessment & Plan Note (Signed)
Repeat potassium today. May be able to decrease or d/c supplementation. The patient indicates understanding of these issues and agrees with the plan.

## 2017-07-17 NOTE — Assessment & Plan Note (Signed)
Well controlled on current rx. D/c demedex for edema, eRx sent for lasix to use as needed.

## 2017-07-17 NOTE — Progress Notes (Signed)
Subjective:   Patient ID: [name], female    DOB: Aug 05, 1945, 72 y.o.   MRN: [id number]  [name] is a pleasant 72 y.o. year old female who presents to clinic today with Follow-up (Patient is here today for a 2-week-F/U.  On 3.14.19 her K was decreased to 55mEq and decided that we would recheck in 2 weeks to decide if that dose should stay the same or go back up.  Hyzaar was D/C'ed and Losartan 100mg  1qd was started in its place.  She does need to call [facility] for a repeat Colonoscopy as she is due. She agrees to the PNV-23 today. )  on 07/17/2017  HPI:  Here for 2 week follow up.  Saw patient on 07/03/17 with blood pressure medication concerns. Note reviewed.  Had been well controlled on Hyzaar but she was concerned about the warnings of HCTZ causing cancer. She did have an rx for demadex to use as needed for swelling.  We therefore d/c'd hyzaar and placed her on losartan and advised using demadex as needed for swelling.  Also advised to decrease potassium to 20 meq daily.  She did have some swelling and took demadex and felt it was too strong.  She had to "run to the bathroom constantly."   Current Outpatient Medications on File Prior to Visit  Medication Sig Dispense Refill  . losartan (COZAAR) 100 MG tablet Take 1 tablet (100 mg total) by mouth daily. 90 tablet 3  . pantoprazole (PROTONIX) 40 MG tablet TAKE 1 TABLET(40 MG) BY MOUTH DAILY 30 tablet 0  . Polyethylene Glycol 3350 (MIRALAX PO) Take 17 g by mouth daily as needed. For constipation. Mix with 8 ounces of fluid.    . potassium chloride SA (K-DUR,KLOR-CON) 20 MEQ tablet Take 1 tablet (20 mEq total) by mouth daily. 30 tablet 3  . pravastatin (PRAVACHOL) 20 MG tablet Take 1 tablet (20 mg total) by mouth daily. 90 tablet 3  . predniSONE (DELTASONE) 5 MG tablet Take 6 pills for first day, 5 pills second day, 4 pills third day, 3 pills fourth day, 2 pills the fifth day, and 1 pill sixth day. 21  tablet 0  . Probiotic Product (ALIGN PO) Take 1 tablet by mouth daily.      No current facility-administered medications on file prior to visit.     Allergies  Allergen Reactions  . Codeine Itching  . Statins Other (See Comments)    Muscle aches    Past Medical History:  Diagnosis Date  . Blood transfusion    /w childbirth   . CAD (coronary artery disease)   . Cancer ([place])    L breast - chemotherapy & radiation- 2000  . Colon polyp   . Diverticulitis    2009, while hosp for Diverticulitis, experienced pneumonia   . Fibromyalgia   . Gastritis   . GERD (gastroesophageal reflux disease)   . Hemorrhoids   . Hyperlipidemia   . Hypertension    sees [facility]- thinks last appt. 2012  . Lymph edema    L arm, treatment 2013- PT until recent weeks   . Personal history of radiation therapy 2000  . Pneumonia    2009    Past Surgical History:  Procedure Laterality Date  . ABDOMINAL ADHESION SURGERY    . ABDOMINAL HYSTERECTOMY     bleeding, leiomyomata  . ANTERIOR AND POSTERIOR VAGINAL REPAIR  2010  . APPENDECTOMY    . BREAST RECONSTRUCTION    .  BREAST SURGERY     Mastectomy  . CARDIAC CATHETERIZATION     yrs. ago- Dr. Terrence Dupont- [facility]  . ingrown toenails      R foot & hammer toe surgery   . INGUINAL HERNIA REPAIR  09/27/2011   Procedure: HERNIA REPAIR INGUINAL ADULT;  Surgeon: Gwenyth Ober, MD;  Location: [place];  Service: General;  Laterality: Right;  . KNEE SURGERY    . MASTECTOMY Left 2000  . NASAL SINUS SURGERY    . REDUCTION MAMMAPLASTY Right 2000  . ROTATOR CUFF REPAIR    . TONSILLECTOMY     as a child    Family History  Problem Relation Age of Onset  . Alzheimer's disease Mother   . Breast cancer Sister 84  . Colon polyps Sister   . Cancer Son        prostate  . Anesthesia problems Neg Hx     Social History   Socioeconomic History  . Marital status: Married    Spouse name: Not on file  . Number of children: Not on file  . Years of education: Not on file    . Highest education level: Not on file  Occupational History  . Not on file  Social Needs  . Financial resource strain: Not on file  . Food insecurity:    Worry: Not on file    Inability: Not on file  . Transportation needs:    Medical: Not on file    Non-medical: Not on file  Tobacco Use  . Smoking status: Former Smoker    Last attempt to quit: 09/20/1998    Years since quitting: 18.8  . Smokeless tobacco: Never Used  Substance and Sexual Activity  . Alcohol use: No    Alcohol/week: 0.0 oz  . Drug use: No  . Sexual activity: Yes    Comment: 1st intercourse 72 yo-Fewer than 5 partners  Lifestyle  . Physical activity:    Days per week: Not on file    Minutes per session: Not on file  . Stress: Not on file  Relationships  . Social connections:    Talks on phone: Not on file    Gets together: Not on file    Attends religious service: Not on file    Active member of club or organization: Not on file    Attends meetings of clubs or organizations: Not on file    Relationship status: Not on file  . Intimate partner violence:    Fear of current or ex partner: Not on file    Emotionally abused: Not on file    Physically abused: Not on file    Forced sexual activity: Not on file  Other Topics Concern  . Not on file  Social History Narrative   Widowed- husband died of Alzheimers in 02-18-13.   The PMH, PSH, Social History, Family History, Medications, and allergies have been reviewed in [facility], and have been updated if relevant.   Review of Systems  Genitourinary: Positive for frequency. Negative for difficulty urinating, dyspareunia, dysuria, enuresis, flank pain, genital sores, hematuria and menstrual problem.  Musculoskeletal: Negative.   Neurological: Negative.   All other systems reviewed and are negative.      Objective:    BP 112/66 (BP Location: Right Arm, Patient Position: Sitting, Cuff Size: Normal)   Pulse 76   Temp 98 F (36.7 C) (Oral)   Ht 5' 7.75" (1.721  m)   Wt 206 lb 12.8 oz (93.8 kg)   SpO2  98%   BMI 31.68 kg/m    Physical Exam   General:  Well-developed,well-nourished,in no acute distress; alert,appropriate and cooperative throughout examination Head:  normocephalic and atraumatic.   Eyes:  vision grossly intact, PERRL Ears:  R ear normal and L ear normal externally, TMs clear bilaterally Nose:  no external deformity.   Mouth:  good dentition.   Neck:  No deformities, masses, or tenderness noted. Lungs:  Normal respiratory effort, chest expands symmetrically. Lungs are clear to auscultation, no crackles or wheezes. Heart:  Normal rate and regular rhythm. S1 and S2 normal without gallop, murmur, click, rub or other extra sounds. Msk:  No deformity or scoliosis noted of thoracic or lumbar spine.   Extremities:  No clubbing, cyanosis, edema, or deformity noted with normal full range of motion of all joints.   Neurologic:  alert & oriented X3 and gait normal.   Skin:  Intact without suspicious lesions or rashes Cervical Nodes:  No lymphadenopathy noted Axillary Nodes:  No palpable lymphadenopathy Psych:  Cognition and judgment appear intact. Alert and cooperative with normal attention span and concentration. No apparent delusions, illusions, hallucinations       Assessment & Plan:   Need for pneumococcal vaccination - Plan: Pneumococcal polysaccharide vaccine 23-valent greater than or equal to 2yo subcutaneous/IM  Hypokalemia - Plan: Basic metabolic panel  Essential hypertension No follow-ups on file.

## 2017-07-17 NOTE — Patient Instructions (Signed)
STOP taking demadex as needed as a fluid pill and take lasix as needed instead.  Please keep me updated.   I will call you with your potassium level.

## 2017-07-18 LAB — BASIC METABOLIC PANEL
BUN: 14 mg/dL (ref 6–23)
CHLORIDE: 108 meq/L (ref 96–112)
CO2: 29 meq/L (ref 19–32)
CREATININE: 0.76 mg/dL (ref 0.40–1.20)
Calcium: 9.7 mg/dL (ref 8.4–10.5)
GFR: 96.15 mL/min (ref >60.00)
GLUCOSE: 94 mg/dL (ref 70–99)
Potassium: 4.1 mEq/L (ref 3.5–5.1)
Sodium: 143 mEq/L (ref 135–145)

## 2017-07-23 ENCOUNTER — Encounter: Payer: Self-pay | Admitting: Family Medicine

## 2017-07-23 ENCOUNTER — Ambulatory Visit (INDEPENDENT_AMBULATORY_CARE_PROVIDER_SITE_OTHER): Payer: Medicare Other | Admitting: Family Medicine

## 2017-07-23 ENCOUNTER — Ambulatory Visit: Payer: Self-pay

## 2017-07-23 VITALS — BP 130/66 | HR 75 | Temp 98.0°F | Ht 67.75 in | Wt 208.0 lb

## 2017-07-23 DIAGNOSIS — M25552 Pain in left hip: Secondary | ICD-10-CM

## 2017-07-23 NOTE — Patient Instructions (Signed)
Please let me know if you want to try physical therapy  Please try the exercises  Please follow up with me in 4-6 weeks if your pain hasn't improved.

## 2017-07-23 NOTE — Progress Notes (Signed)
CHESTINA KOMATSU - 72 y.o. female MRN [id number]  Date of birth: 26-Oct-1945  SUBJECTIVE:  Including CC & ROS.  Chief Complaint  Patient presents with  . Left hip pain    [place] Hoar is a 72 y.o. female that is presenting with left hip pain. Pain has not improved. She completed a course of prednisone with no improvement. Pain is on the lateral aspect of the greater trochanter. Denies any radicular symptoms. Pain is worse with lying on the affected side.     Review of Systems  Constitutional: Negative for fever.  HENT: Negative for congestion.   Musculoskeletal: Negative for gait problem.  Neurological: Negative for weakness.  Hematological: Negative for adenopathy.    HISTORY: Past Medical, Surgical, Social, and Family History Reviewed & Updated per EMR.   Pertinent Historical Findings include:  Past Medical History:  Diagnosis Date  . Blood transfusion    /w childbirth   . CAD (coronary artery disease)   . Cancer ([place])    L breast - chemotherapy & radiation- 2000  . Colon polyp   . Diverticulitis    2009, while hosp for Diverticulitis, experienced pneumonia   . Fibromyalgia   . Gastritis   . GERD (gastroesophageal reflux disease)   . Hemorrhoids   . Hyperlipidemia   . Hypertension    sees [facility]- thinks last appt. 2012  . Lymph edema    L arm, treatment 2013- PT until recent weeks   . Personal history of radiation therapy 2000  . Pneumonia    2009    Past Surgical History:  Procedure Laterality Date  . ABDOMINAL ADHESION SURGERY    . ABDOMINAL HYSTERECTOMY     bleeding, leiomyomata  . ANTERIOR AND POSTERIOR VAGINAL REPAIR  2010  . APPENDECTOMY    . BREAST RECONSTRUCTION    . BREAST SURGERY     Mastectomy  . CARDIAC CATHETERIZATION     yrs. ago- Dr. Terrence Dupont- [facility]  . ingrown toenails      R foot & hammer toe surgery   . INGUINAL HERNIA REPAIR  09/27/2011   Procedure: HERNIA REPAIR INGUINAL ADULT;  Surgeon: Gwenyth Ober, MD;  Location: [place];  Service:  General;  Laterality: Right;  . KNEE SURGERY    . MASTECTOMY Left 2000  . NASAL SINUS SURGERY    . REDUCTION MAMMAPLASTY Right 2000  . ROTATOR CUFF REPAIR    . TONSILLECTOMY     as a child    Allergies  Allergen Reactions  . Codeine Itching  . Statins Other (See Comments)    Muscle aches    Family History  Problem Relation Age of Onset  . Alzheimer's disease Mother   . Breast cancer Sister 58  . Colon polyps Sister   . Cancer Son        prostate  . Anesthesia problems Neg Hx      Social History   Socioeconomic History  . Marital status: Married    Spouse name: Not on file  . Number of children: Not on file  . Years of education: Not on file  . Highest education level: Not on file  Occupational History  . Not on file  Social Needs  . Financial resource strain: Not on file  . Food insecurity:    Worry: Not on file    Inability: Not on file  . Transportation needs:    Medical: Not on file    Non-medical: Not on file  Tobacco Use  .  Smoking status: Former Smoker    Last attempt to quit: 09/20/1998    Years since quitting: 18.8  . Smokeless tobacco: Never Used  Substance and Sexual Activity  . Alcohol use: No    Alcohol/week: 0.0 oz  . Drug use: No  . Sexual activity: Yes    Comment: 1st intercourse 72 yo-Fewer than 5 partners  Lifestyle  . Physical activity:    Days per week: Not on file    Minutes per session: Not on file  . Stress: Not on file  Relationships  . Social connections:    Talks on phone: Not on file    Gets together: Not on file    Attends religious service: Not on file    Active member of club or organization: Not on file    Attends meetings of clubs or organizations: Not on file    Relationship status: Not on file  . Intimate partner violence:    Fear of current or ex partner: Not on file    Emotionally abused: Not on file    Physically abused: Not on file    Forced sexual activity: Not on file  Other Topics Concern  . Not on file   Social History Narrative   Widowed- husband died of Alzheimers in 02/11/13.     PHYSICAL EXAM:  VS: BP 130/66 (BP Location: Left Arm, Patient Position: Sitting, Cuff Size: Normal)   Pulse 75   Temp 98 F (36.7 C) (Oral)   Ht 5' 7.75" (1.721 m)   Wt 208 lb (94.3 kg)   SpO2 98%   BMI 31.86 kg/m  Physical Exam Gen: NAD, alert, cooperative with exam, well-appearing ENT: normal lips, normal nasal mucosa,  Eye: normal EOM, normal conjunctiva and lids CV:  no edema, +2 pedal pulses   Resp: no accessory muscle use, non-labored,  GI: no masses or tenderness, no hernia  Skin: no rashes, no areas of induration  Neuro: normal tone, normal sensation to touch Psych:  normal insight, alert and oriented MSK:  Left hip:  Normal IR and ER  TTP over the GT  Normal strength to resistance with hip flexion  Normal gait  Neurovascularly intact.    Aspiration/Injection Procedure Note [place] Magwood September 22, 1945  Procedure: Injection Indications: left hip pain   Procedure Details Consent: Risks of procedure as well as the alternatives and risks of each were explained to the (patient/caregiver).  Consent for procedure obtained. Time Out: Verified patient identification, verified procedure, site/side was marked, verified correct patient position, special equipment/implants available, medications/allergies/relevent history reviewed, required imaging and test results available.  Performed.  The area was cleaned with iodine and alcohol swabs.    The left greater trochanter bursa was injected using 1 cc's of 40 mg Depomedrol and 4 cc's of 1% lidocaine with a 22 3" needle.  Ultrasound was used. Images were obtained in Transverse views showing the injection.    A sterile dressing was applied.  Patient did tolerate procedure well.     ASSESSMENT & PLAN:   Left hip pain Pain more consistent with trochanteric bursitis.  - injection today  - pennsaid samples  - has exercises  - if no  improvement consider PT.

## 2017-07-23 NOTE — Assessment & Plan Note (Signed)
Pain more consistent with trochanteric bursitis.  - injection today  - pennsaid samples  - has exercises  - if no improvement consider PT.

## 2017-07-24 DIAGNOSIS — H10413 Chronic giant papillary conjunctivitis, bilateral: Secondary | ICD-10-CM | POA: Diagnosis not present

## 2017-07-24 DIAGNOSIS — H04123 Dry eye syndrome of bilateral lacrimal glands: Secondary | ICD-10-CM | POA: Diagnosis not present

## 2017-07-24 DIAGNOSIS — Z961 Presence of intraocular lens: Secondary | ICD-10-CM | POA: Diagnosis not present

## 2017-07-24 DIAGNOSIS — H43811 Vitreous degeneration, right eye: Secondary | ICD-10-CM | POA: Diagnosis not present

## 2017-07-24 DIAGNOSIS — H35373 Puckering of macula, bilateral: Secondary | ICD-10-CM | POA: Diagnosis not present

## 2017-08-08 ENCOUNTER — Other Ambulatory Visit: Payer: Self-pay | Admitting: Family Medicine

## 2017-08-13 ENCOUNTER — Other Ambulatory Visit: Payer: Self-pay

## 2017-10-30 DIAGNOSIS — H35373 Puckering of macula, bilateral: Secondary | ICD-10-CM | POA: Diagnosis not present

## 2017-10-30 DIAGNOSIS — H43811 Vitreous degeneration, right eye: Secondary | ICD-10-CM | POA: Diagnosis not present

## 2017-10-30 DIAGNOSIS — H04123 Dry eye syndrome of bilateral lacrimal glands: Secondary | ICD-10-CM | POA: Diagnosis not present

## 2017-11-11 DIAGNOSIS — K5904 Chronic idiopathic constipation: Secondary | ICD-10-CM | POA: Diagnosis not present

## 2017-11-11 DIAGNOSIS — Z1211 Encounter for screening for malignant neoplasm of colon: Secondary | ICD-10-CM | POA: Diagnosis not present

## 2017-11-11 DIAGNOSIS — Z86010 Personal history of colonic polyps: Secondary | ICD-10-CM | POA: Diagnosis not present

## 2017-11-11 DIAGNOSIS — K5730 Diverticulosis of large intestine without perforation or abscess without bleeding: Secondary | ICD-10-CM | POA: Diagnosis not present

## 2017-11-17 ENCOUNTER — Other Ambulatory Visit: Payer: Self-pay | Admitting: Family Medicine

## 2017-11-18 ENCOUNTER — Encounter (HOSPITAL_COMMUNITY): Payer: Self-pay

## 2017-11-18 ENCOUNTER — Emergency Department (HOSPITAL_COMMUNITY)
Admission: EM | Admit: 2017-11-18 | Discharge: 2017-11-18 | Disposition: A | Discharge status: Home / Self Care | Payer: Medicare Other | Attending: Emergency Medicine | Admitting: Emergency Medicine

## 2017-11-18 ENCOUNTER — Emergency Department (HOSPITAL_COMMUNITY): Payer: Medicare Other

## 2017-11-18 DIAGNOSIS — N200 Calculus of kidney: Secondary | ICD-10-CM | POA: Diagnosis not present

## 2017-11-18 DIAGNOSIS — Z87891 Personal history of nicotine dependence: Secondary | ICD-10-CM | POA: Insufficient documentation

## 2017-11-18 DIAGNOSIS — R112 Nausea with vomiting, unspecified: Secondary | ICD-10-CM | POA: Diagnosis not present

## 2017-11-18 DIAGNOSIS — I2510 Atherosclerotic heart disease of native coronary artery without angina pectoris: Secondary | ICD-10-CM | POA: Insufficient documentation

## 2017-11-18 DIAGNOSIS — I10 Essential (primary) hypertension: Secondary | ICD-10-CM | POA: Diagnosis not present

## 2017-11-18 DIAGNOSIS — R109 Unspecified abdominal pain: Secondary | ICD-10-CM | POA: Diagnosis present

## 2017-11-18 DIAGNOSIS — R1030 Lower abdominal pain, unspecified: Secondary | ICD-10-CM | POA: Diagnosis not present

## 2017-11-18 DIAGNOSIS — N3000 Acute cystitis without hematuria: Secondary | ICD-10-CM

## 2017-11-18 DIAGNOSIS — N132 Hydronephrosis with renal and ureteral calculous obstruction: Secondary | ICD-10-CM | POA: Insufficient documentation

## 2017-11-18 DIAGNOSIS — Z79899 Other long term (current) drug therapy: Secondary | ICD-10-CM | POA: Diagnosis not present

## 2017-11-18 DIAGNOSIS — Z853 Personal history of malignant neoplasm of breast: Secondary | ICD-10-CM | POA: Diagnosis not present

## 2017-11-18 LAB — CBC
HCT: 46.4 % — ABNORMAL HIGH (ref 36.0–46.0)
Hemoglobin: 14.2 g/dL (ref 12.0–15.0)
MCH: 25.8 pg — AB (ref 26.0–34.0)
MCHC: 30.6 g/dL (ref 30.0–36.0)
MCV: 84.4 fL (ref 78.0–100.0)
Platelets: 230 10*3/uL (ref 150–400)
RBC: 5.5 MIL/uL — ABNORMAL HIGH (ref 3.87–5.11)
RDW: 13.4 % (ref 11.5–15.5)
WBC: 10.1 10*3/uL (ref 4.0–10.5)

## 2017-11-18 LAB — URINALYSIS, ROUTINE W REFLEX MICROSCOPIC
Bilirubin Urine: NEGATIVE
Glucose, UA: NEGATIVE mg/dL
Ketones, ur: NEGATIVE mg/dL
Leukocytes, UA: NEGATIVE
Nitrite: NEGATIVE
Protein, ur: 30 mg/dL — AB
SPECIFIC GRAVITY, URINE: 1.024 (ref 1.005–1.030)
pH: 6 (ref 5.0–8.0)

## 2017-11-18 LAB — COMPREHENSIVE METABOLIC PANEL
ALBUMIN: 4.5 g/dL (ref 3.5–5.0)
ALK PHOS: 90 U/L (ref 38–126)
ALT: 9 U/L (ref 0–44)
AST: 20 U/L (ref 15–41)
Anion gap: 11 (ref 5–15)
BUN: 10 mg/dL (ref 8–23)
CO2: 24 mmol/L (ref 22–32)
Calcium: 10.5 mg/dL — ABNORMAL HIGH (ref 8.9–10.3)
Chloride: 109 mmol/L (ref 98–111)
Creatinine, Ser: 0.96 mg/dL (ref 0.44–1.00)
GFR calc Af Amer: >60 mL/min (ref >60)
GFR calc non Af Amer: 58 mL/min — ABNORMAL LOW (ref >60)
GLUCOSE: 126 mg/dL — AB (ref 70–99)
POTASSIUM: 4 mmol/L (ref 3.5–5.1)
SODIUM: 144 mmol/L (ref 135–145)
Total Bilirubin: 0.6 mg/dL (ref 0.3–1.2)
Total Protein: 7 g/dL (ref 6.5–8.1)

## 2017-11-18 LAB — LIPASE, BLOOD: Lipase: 27 U/L (ref 11–51)

## 2017-11-18 MED ORDER — ONDANSETRON 4 MG PO TBDP: 4.0000 mg | ORAL_TABLET | Freq: Three times a day (TID) | ORAL | 0 refills | Status: DC | PRN

## 2017-11-18 MED ORDER — ONDANSETRON HCL 4 MG/2ML IJ SOLN
4.0000 mg | Freq: Once | INTRAMUSCULAR | Status: AC
  Administered 2017-11-18: 4 mg via INTRAVENOUS
  Filled 2017-11-18: qty 2

## 2017-11-18 MED ORDER — CEPHALEXIN 500 MG PO CAPS: 500.0000 mg | ORAL_CAPSULE | Freq: Two times a day (BID) | ORAL | 0 refills | Status: AC

## 2017-11-18 MED ORDER — FENTANYL CITRATE (PF) 100 MCG/2ML IJ SOLN
75.0000 ug | Freq: Once | INTRAMUSCULAR | Status: AC
  Administered 2017-11-18: 75 ug via INTRAVENOUS
  Filled 2017-11-18: qty 2

## 2017-11-18 MED ORDER — HYDROCODONE-ACETAMINOPHEN 5-325 MG PO TABS: 1.0000 | ORAL_TABLET | Freq: Four times a day (QID) | ORAL | 0 refills | Status: AC | PRN

## 2017-11-18 MED ORDER — IOHEXOL 300 MG/ML  SOLN
100.0000 mL | Freq: Once | INTRAMUSCULAR | Status: AC | PRN
  Administered 2017-11-18: 100 mL via INTRAVENOUS

## 2017-11-18 MED ORDER — LACTATED RINGERS IV BOLUS
500.0000 mL | Freq: Once | INTRAVENOUS | Status: AC
  Administered 2017-11-18: 500 mL via INTRAVENOUS

## 2017-11-18 MED ORDER — SODIUM CHLORIDE 0.9 % IV SOLN
1.0000 g | Freq: Once | INTRAVENOUS | Status: AC
  Administered 2017-11-18: 1 g via INTRAVENOUS
  Filled 2017-11-18: qty 10

## 2017-11-18 NOTE — ED Notes (Signed)
Patient transported to CT 

## 2017-11-18 NOTE — ED Provider Notes (Addendum)
[facility] EMERGENCY DEPARTMENT Provider Note   CSN: [id number] Arrival date & time: 11/18/17  1327     History   Chief Complaint Chief Complaint  Patient presents with  . Abdominal Pain    HPI [name] is a 72 y.o. female.  HPI 72 year old female with history of CAD, diverticulitis, hypertension, hyperlipidemia presents to the emergency department today for evaluation of persistent right flank/right lower quadrant abdominal pain.  States the pain began around 9 AM this morning and has been persistent aching pain.  Radiates to her back.  Has had multiple episodes of nonbloody/nonbilious emesis.  No chest pain or shortness of breath.  States she was able to urinate a small amount today that was nonbloody.  No dysuria.  Has no history of kidney stones though there is a family history of kidney stones.  History of left lower quadrant diverticulitis several years ago.  Has had no fevers, chills or weight loss.  Continues to pass gas and had a small bowel movement today.  Took no medications prior to arrival.  Past Medical History:  Diagnosis Date  . Blood transfusion    /w childbirth   . CAD (coronary artery disease)   . Cancer ([place])    L breast - chemotherapy & radiation- 2000  . Colon polyp   . Diverticulitis    2009, while hosp for Diverticulitis, experienced pneumonia   . Fibromyalgia   . Gastritis   . GERD (gastroesophageal reflux disease)   . Hemorrhoids   . Hyperlipidemia   . Hypertension    sees [facility]- thinks last appt. 2012  . Lymph edema    L arm, treatment 2013- PT until recent weeks   . Personal history of radiation therapy 2000  . Pneumonia    2009    Patient Active Problem List   Diagnosis Date Noted  . Cervical radiculopathy 07/07/2017  . Hypokalemia 01/14/2014  . Insomnia 01/14/2014  . HLD (hyperlipidemia) 12/14/2013  . HTN (hypertension) 12/14/2013  . GERD (gastroesophageal reflux disease) 12/14/2013  . CAD (coronary  artery disease) 12/14/2013  . Depression 12/14/2013  . History of breast cancer in female 12/14/2013  . Edema 12/14/2013  . Fibromyalgia 12/14/2013  . Left hip pain 12/14/2013    Past Surgical History:  Procedure Laterality Date  . ABDOMINAL ADHESION SURGERY    . ABDOMINAL HYSTERECTOMY     bleeding, leiomyomata  . ANTERIOR AND POSTERIOR VAGINAL REPAIR  2010  . APPENDECTOMY    . BREAST RECONSTRUCTION    . BREAST SURGERY     Mastectomy  . CARDIAC CATHETERIZATION     yrs. ago- Dr. Terrence Dupont- [facility]  . ingrown toenails      R foot & hammer toe surgery   . INGUINAL HERNIA REPAIR  09/27/2011   Procedure: HERNIA REPAIR INGUINAL ADULT;  Surgeon: Gwenyth Ober, MD;  Location: [place];  Service: General;  Laterality: Right;  . KNEE SURGERY    . MASTECTOMY Left 2000  . NASAL SINUS SURGERY    . REDUCTION MAMMAPLASTY Right 2000  . ROTATOR CUFF REPAIR    . TONSILLECTOMY     as a child     OB History    Gravida  5   Para  5   Term      Preterm      AB      Living  4     SAB      TAB      Ectopic  Multiple      Live Births               Home Medications    Prior to Admission medications   Medication Sig Start Date End Date Taking? Authorizing Provider  cephALEXin (KEFLEX) 500 MG capsule Take 1 capsule (500 mg total) by mouth 2 (two) times daily for 10 days. 11/18/17 11/28/17  Alford Highland, MD  furosemide (LASIX) 20 MG tablet Take 1 tablet (20 mg total) by mouth as needed. 07/17/17   Lucille Passy, MD  HYDROcodone-acetaminophen (NORCO/VICODIN) 5-325 MG tablet Take 1 tablet by mouth every 6 (six) hours as needed for up to 3 days. 11/18/17 11/21/17  Alford Highland, MD  losartan (COZAAR) 100 MG tablet Take 1 tablet (100 mg total) by mouth daily. 07/03/17   Lucille Passy, MD  ondansetron (ZOFRAN ODT) 4 MG disintegrating tablet Take 1 tablet (4 mg total) by mouth every 8 (eight) hours as needed for nausea or vomiting. 11/18/17   Alford Highland, MD  pantoprazole (PROTONIX) 40 MG  tablet TAKE 1 TABLET(40 MG) BY MOUTH DAILY 11/17/17   Lucille Passy, MD  Polyethylene Glycol 3350 (MIRALAX PO) Take 17 g by mouth daily as needed. For constipation. Mix with 8 ounces of fluid.    [provider]  potassium chloride SA (K-DUR,KLOR-CON) 20 MEQ tablet Take 1 tablet (20 mEq total) by mouth daily. 07/03/17   Lucille Passy, MD  pravastatin (PRAVACHOL) 20 MG tablet Take 1 tablet (20 mg total) by mouth daily. 07/08/16   Lucille Passy, MD  Probiotic Product (ALIGN PO) Take 1 tablet by mouth daily.     [provider]    Family History Family History  Problem Relation Age of Onset  . Alzheimer's disease Mother   . Breast cancer Sister 20  . Colon polyps Sister   . Cancer Son        prostate  . Anesthesia problems Neg Hx     Social History Social History   Tobacco Use  . Smoking status: Former Smoker    Last attempt to quit: 09/20/1998    Years since quitting: 19.1  . Smokeless tobacco: Never Used  Substance Use Topics  . Alcohol use: No    Alcohol/week: 0.0 oz  . Drug use: No     Allergies   Codeine and Statins   Review of Systems Review of Systems  Constitutional: Negative for chills and fever.  HENT: Negative for congestion and sore throat.   Eyes: Negative for visual disturbance.  Respiratory: Negative for cough and shortness of breath.   Cardiovascular: Negative for chest pain and leg swelling.  Gastrointestinal: Positive for abdominal pain, nausea and vomiting. Negative for blood in stool and diarrhea.  Genitourinary: Positive for flank pain (right). Negative for dysuria and hematuria.  Musculoskeletal: Negative for back pain and neck pain.  Skin: Negative for color change and rash.  Neurological: Negative for weakness and headaches.  All other systems reviewed and are negative.    Physical Exam Updated Vital Signs BP 126/78   Pulse 64   Temp 98 F (36.7 C)   Resp 17   Ht 5\' 8"  (1.727 m)   Wt 91.6 kg (202 lb)   SpO2 100%   BMI  30.71 kg/m   Physical Exam  Constitutional: She appears well-developed.  Non-toxic appearance. She appears ill. No distress.  HENT:  Head: Normocephalic and atraumatic.  Eyes: Conjunctivae are normal.  Neck: Neck supple.  Cardiovascular: Regular rhythm  and intact distal pulses.  Pulmonary/Chest: Effort normal and breath sounds normal. No respiratory distress.  Abdominal: Soft. She exhibits no distension. There is tenderness in the right lower quadrant. There is CVA tenderness (right).  Musculoskeletal: She exhibits no edema.  Neurological: She is alert.  Skin: Skin is warm and dry.  Psychiatric: She has a normal mood and affect.  Nursing note and vitals reviewed.    ED Treatments / Results  Labs (all labs ordered are listed, but only abnormal results are displayed) Labs Reviewed  COMPREHENSIVE METABOLIC PANEL - Abnormal; Notable for the following components:      Result Value   Glucose, Bld 126 (*)    Calcium 10.5 (*)    GFR calc non Af Amer 58 (*)    All other components within normal limits  CBC - Abnormal; Notable for the following components:   RBC 5.50 (*)    HCT 46.4 (*)    MCH 25.8 (*)    All other components within normal limits  URINALYSIS, ROUTINE W REFLEX MICROSCOPIC - Abnormal; Notable for the following components:   APPearance HAZY (*)    Hgb urine dipstick SMALL (*)    Protein, ur 30 (*)    Bacteria, UA FEW (*)    All other components within normal limits  LIPASE, BLOOD    EKG None  Radiology Ct Abdomen Pelvis W Contrast  Result Date: 11/18/2017 CLINICAL DATA:  72 year old female with a history abdominal pain and back pain with nausea and vomiting EXAM: CT ABDOMEN AND PELVIS WITH CONTRAST TECHNIQUE: Multidetector CT imaging of the abdomen and pelvis was performed using the standard protocol following bolus administration of intravenous contrast. CONTRAST:  138mL OMNIPAQUE IOHEXOL 300 MG/ML  SOLN COMPARISON:  08/23/2011 FINDINGS: Lower chest: No acute  finding of the lower chest. Hepatobiliary: Unremarkable liver.  Unremarkable gallbladder. Pancreas: Unremarkable pancreas Spleen: Unremarkable spleen Adrenals/Urinary Tract: Unremarkable adrenal glands. Right kidney with moderate hydronephrosis. Dilation of the length of the ureter with associated inflammatory changes. There is a tiny radiopaque stone at the right ureterovesical junction measuring 2 mm. No evidence of stones within the right collecting system. Left kidney without hydronephrosis or nephrolithiasis. Unremarkable course of the left ureter. Urinary bladder relatively decompressed. Stomach/Bowel: Small hiatal hernia. Unremarkable appearance of the stomach. Unremarkable small bowel. No evidence of transition point. No abnormally distended small bowel. No focal inflammatory changes. Normal appendix. Colonic diverticula without associated inflammatory changes. Vascular/Lymphatic: Vascular calcifications. No periaortic fluid. No dissection or aneurysm. Mesenteric arteries and bilateral renal arteries are patent. Bilateral iliac arteries and common femoral arteries are patent. No adenopathy of the peritoneum, pelvis, or inguinal regions. Reproductive: Hysterectomy Other: Surgical changes of the left inguinal region and the left abdominal wall. Small fat containing hernia. Musculoskeletal: No acute displaced fracture. Degenerative changes of the visualized spine. Degenerative changes of the hips. IMPRESSION: Small ureteral stone obstructing at the right ureterovesical junction, with moderate right-sided hydronephrosis. If there is concern for ascending urinary tract infection, correlation with urinalysis may be useful. Diverticular disease without evidence of acute diverticulitis. Hiatal hernia. Aortic Atherosclerosis (ICD10-I70.0). Electronically Signed   By: Corrie Mckusick D.O.   On: 11/18/2017 17:31    Procedures Procedures (including critical care time)  Medications Ordered in ED Medications    fentaNYL (SUBLIMAZE) injection 75 mcg (75 mcg Intravenous Given 11/18/17 1522)  ondansetron (ZOFRAN) injection 4 mg (4 mg Intravenous Given 11/18/17 1512)  fentaNYL (SUBLIMAZE) injection 75 mcg (75 mcg Intravenous Given 11/18/17 1659)  ondansetron (ZOFRAN) injection 4  mg (4 mg Intravenous Given 11/18/17 1657)  lactated ringers bolus 500 mL (0 mLs Intravenous Stopped 11/18/17 1804)  iohexol (OMNIPAQUE) 300 MG/ML solution 100 mL (100 mLs Intravenous Contrast Given 11/18/17 1711)  cefTRIAXone (ROCEPHIN) 1 g in sodium chloride 0.9 % 100 mL IVPB (0 g Intravenous Stopped 11/18/17 1849)     Initial Impression / Assessment and Plan / ED Course  I have reviewed the triage vital signs and the nursing notes.  Pertinent labs & imaging results that were available during my care of the patient were reviewed by me and considered in my medical decision making (see chart for details).  Clinical Course as of Nov 21 18  Tue Nov 18, 2017  1748 I reviewed her CT scan.  She has a small ureteral stone obstructing at the right ureterovesical junction, with moderate right-sided hydronephrosis.  She is afebrile, does not have a leukocytosis, and does not have flank pain. I do not think that she has a septic obstructing stone or pyelonephritis.  Her UA presents a mixed picture for UTI, with few bacteria and 6-10 WBCs.  However, it is LE and nitrite negative.   [NA]    Clinical Course User Index [NA] Alford Highland, MD   72 year old female with history of CAD, diverticulitis, hypertension, hyperlipidemia presents to the emergency department today for evaluation of persistent right flank/right lower quadrant abdominal pain.  States the pain began around 9 AM this morning and has been persistent aching pain.   Pt afeb, HR 50s at presentation. Ill but non-toxic appearing. History and exam concerning for nephrolithiasis. Lower on differential is SBO (though history of adhesions), appendicitis (thinks she has had  appendectomy), cholecystitis as more RLQ pain. Diverticulitis also on ddx given history of same (though LLQ) in past. IV fluids, IV fentanyl, zofran ordered. Will obtain ct abd/pelv for further evaluation.   Labs reviewed including CBC, CMP, Lipase reviewed all of which are reassuring. Pain significantly improved with no evidence of peritoniits on reevaluation after fentanyl. Awaiting urine studies and CT abd/pelv at time of handoff at 1600 to Dr. Drue Flirt.   Case and plan of care discussed with Dr. Sherry Ruffing.   Final Clinical Impressions(s) / ED Diagnoses   Final diagnoses:  Nephrolithiasis  Acute cystitis without hematuria    ED Discharge Orders        Ordered    cephALEXin (KEFLEX) 500 MG capsule  2 times daily     11/18/17 1813    ondansetron (ZOFRAN ODT) 4 MG disintegrating tablet  Every 8 hours PRN     11/18/17 1813    HYDROcodone-acetaminophen (NORCO/VICODIN) 5-325 MG tablet  Every 6 hours PRN     11/18/17 1813       Corrie Dandy, MD 11/18/17 1606    Corrie Dandy, MD 11/20/17 0020    Tegeler, Gwenyth Allegra, MD 11/20/17 [phone]

## 2017-11-18 NOTE — ED Triage Notes (Signed)
Pt from home via ems; pt c/o lower abd pain beginning today at 0900, radiating to lower back; pt also endorses N/V; pt endorses constant feeling of needing to have bowel movement; pt states she had a small bowel movement today; pt has vomited four times today; pt cool and clammy on ems arrival, skin cool and dry at present; no sob, no cp  138/84 HR 64 RR 18 CBG 113 100% RA

## 2017-11-18 NOTE — ED Notes (Signed)
Pt given ginger ale to drink. 

## 2017-11-18 NOTE — ED Notes (Signed)
IV team at bedside 

## 2017-11-18 NOTE — Discharge Instructions (Addendum)
[place]:  Thank you for allowing [place] to take care of you today.  We hope you begin feeling better soon.  To-Do: Please follow-up with Urology for your kidney stone Please take keflex, zofran, and norco as prescribed Please return to the Emergency Department or call 911 if you experience chest pain, shortness of breath, severe pain, severe fever, altered mental status, or have any reason to think that you need emergency medical care.  Thank you again.  Hope you feel better soon.

## 2017-11-18 NOTE — ED Provider Notes (Signed)
72 year old female signed out to me from Dr. Sherry Ruffing.  She is here with abdominal pain and vomiting.  She is pending the rest of her lab work and a CAT scan.  She states her pain is much improved from prior.  Her work-up is been significant for CT finding a 2 mm stone on the right.  I updated the patient on this and she states should be comfortable going home.  She is asking to eat and drink something.  Because of the 6-10 white 6-10 reds I would give her IV dose of some antibiotics although do not think she is overtly infected.  She will likely go home with some Keflex and some pain meds and follow-up as an outpatient with good understanding for what to return for.   Hayden Rasmussen, MD 11/19/17 1158

## 2017-11-18 NOTE — ED Provider Notes (Signed)
Transfer of Care Note I assumed care of [name] on 11/18/2017 at @NOWNR @.  Briefly, [name] is a 72 y.o. female who:  Acute onset right flank pain  Got zofran and fentanyl  Concern for stone  Getting IVF  The plan includes:  F/u CT scan   Please refer to the original provider's note for additional information regarding the care of [name].  ### Reassessment: I personally reassessed the patient:  Vital Signs:  The most current vitals were:   Vitals:   11/18/17 1615 11/18/17 1630  BP: (!) 144/62 (!) 162/81  Pulse: (!) 51 (!) 48  Resp:    Temp:    SpO2: 100% 99%    Hemodynamics:  The patient is hemodynamically stable.  Additional MDM:  Clinical Course as of Nov 19 5  Tue Nov 18, 2017  1748 I reviewed her CT scan.  She has a small ureteral stone obstructing at the right ureterovesical junction, with moderate right-sided hydronephrosis.  She is afebrile, does not have a leukocytosis, and does not have flank pain. I do not think that she has a septic obstructing stone or pyelonephritis.  Her UA presents a mixed picture for UTI, with few bacteria and 6-10 WBCs.  However, it is LE and nitrite negative.   [NA]    Clinical Course User Index [NA] Alford Highland, MD   She was afebrile.  I believe that she is safe for discharge home.  She was given a dose of Rocephin prior to discharge.  She was discharged with a 10-day course of Keflex due to concern for possible UTI.  I do not think that she is septic, has a septic obstructing stone, or that she has pyelo.  I also discharged her with a limited supply of Zofran and with Norco.  I also provided her with urology follow-up resources.  She felt safe being discharged home.  Her family also felt safe with this plan.  I provided ED return precautions.  The care of this patient was supervised by Dr. Melina Copa, who agreed with the plan and management of the patient.    Alford Highland, MD 11/19/17 0009    Hayden Rasmussen, MD 11/19/17 [phone]

## 2017-11-19 ENCOUNTER — Ambulatory Visit: Payer: Medicare Other | Admitting: Family Medicine

## 2017-11-25 DIAGNOSIS — N201 Calculus of ureter: Secondary | ICD-10-CM | POA: Diagnosis not present

## 2017-12-05 ENCOUNTER — Other Ambulatory Visit: Payer: Self-pay | Admitting: Gynecology

## 2017-12-05 DIAGNOSIS — Z1231 Encounter for screening mammogram for malignant neoplasm of breast: Secondary | ICD-10-CM

## 2017-12-14 ENCOUNTER — Other Ambulatory Visit: Payer: Self-pay | Admitting: Family Medicine

## 2017-12-21 ENCOUNTER — Ambulatory Visit (HOSPITAL_COMMUNITY)
Admission: EM | Admit: 2017-12-21 | Discharge: 2017-12-21 | Disposition: A | Discharge status: Home / Self Care | Payer: Medicare Other | Attending: Family Medicine | Admitting: Family Medicine

## 2017-12-21 ENCOUNTER — Ambulatory Visit (INDEPENDENT_AMBULATORY_CARE_PROVIDER_SITE_OTHER): Payer: Medicare Other

## 2017-12-21 ENCOUNTER — Encounter (HOSPITAL_COMMUNITY): Payer: Self-pay | Admitting: Emergency Medicine

## 2017-12-21 DIAGNOSIS — I700 Atherosclerosis of aorta: Secondary | ICD-10-CM | POA: Diagnosis not present

## 2017-12-21 DIAGNOSIS — M545 Low back pain, unspecified: Secondary | ICD-10-CM

## 2017-12-21 DIAGNOSIS — S134XXA Sprain of ligaments of cervical spine, initial encounter: Secondary | ICD-10-CM

## 2017-12-21 DIAGNOSIS — M5416 Radiculopathy, lumbar region: Secondary | ICD-10-CM | POA: Diagnosis not present

## 2017-12-21 DIAGNOSIS — S199XXA Unspecified injury of neck, initial encounter: Secondary | ICD-10-CM | POA: Diagnosis not present

## 2017-12-21 DIAGNOSIS — S3992XA Unspecified injury of lower back, initial encounter: Secondary | ICD-10-CM | POA: Diagnosis not present

## 2017-12-21 DIAGNOSIS — M5412 Radiculopathy, cervical region: Secondary | ICD-10-CM | POA: Diagnosis not present

## 2017-12-21 MED ORDER — CYCLOBENZAPRINE HCL 5 MG PO TABS: 5.0000 mg | ORAL_TABLET | Freq: Three times a day (TID) | ORAL | 0 refills | Status: DC | PRN

## 2017-12-21 MED ORDER — IBUPROFEN 800 MG PO TABS: 800.0000 mg | ORAL_TABLET | Freq: Three times a day (TID) | ORAL | 0 refills | Status: DC
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
## 2017-12-21 NOTE — Discharge Instructions (Addendum)
Activity as tolerated. Ice or heat to painful muscles Take ibuprofen 3 times a day with food.  This is for pain and inflammation. Take cyclobenzaprine as needed, muscle relaxer.  This can cause drowsiness Follow-up with your primary care doctor if not improved in a week May need orthopedic follow-up if neck and back pain persist
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
## 2018-01-06 ENCOUNTER — Ambulatory Visit
Admission: RE | Admit: 2018-01-06 | Discharge: 2018-01-06 | Disposition: A | Discharge status: Home / Self Care | Payer: Medicare Other | Source: Ambulatory Visit | Attending: Gynecology | Admitting: Gynecology

## 2018-01-06 ENCOUNTER — Other Ambulatory Visit: Payer: Self-pay | Admitting: Gynecology

## 2018-01-06 DIAGNOSIS — Z1231 Encounter for screening mammogram for malignant neoplasm of breast: Secondary | ICD-10-CM

## 2018-01-19 DIAGNOSIS — Z23 Encounter for immunization: Secondary | ICD-10-CM | POA: Diagnosis not present

## 2018-01-20 HISTORY — PX: COLONOSCOPY: SHX174

## 2018-02-06 HISTORY — PX: OTHER SURGICAL HISTORY: SHX169

## 2018-02-16 DIAGNOSIS — Z86010 Personal history of colonic polyps: Secondary | ICD-10-CM | POA: Diagnosis not present

## 2018-02-16 DIAGNOSIS — K635 Polyp of colon: Secondary | ICD-10-CM | POA: Diagnosis not present

## 2018-02-16 DIAGNOSIS — D123 Benign neoplasm of transverse colon: Secondary | ICD-10-CM | POA: Diagnosis not present

## 2018-02-16 DIAGNOSIS — Z1211 Encounter for screening for malignant neoplasm of colon: Secondary | ICD-10-CM | POA: Diagnosis not present

## 2018-03-11 ENCOUNTER — Other Ambulatory Visit: Payer: Self-pay

## 2018-04-10 ENCOUNTER — Encounter: Payer: Self-pay | Admitting: Gynecology

## 2018-04-10 ENCOUNTER — Ambulatory Visit (INDEPENDENT_AMBULATORY_CARE_PROVIDER_SITE_OTHER): Payer: Medicare Other | Admitting: Gynecology

## 2018-04-10 VITALS — BP 138/80 | Ht 69.0 in | Wt 206.0 lb

## 2018-04-10 DIAGNOSIS — Z1272 Encounter for screening for malignant neoplasm of vagina: Secondary | ICD-10-CM

## 2018-04-10 DIAGNOSIS — Z01419 Encounter for gynecological examination (general) (routine) without abnormal findings: Secondary | ICD-10-CM | POA: Diagnosis not present

## 2018-04-10 DIAGNOSIS — Z9189 Other specified personal risk factors, not elsewhere classified: Secondary | ICD-10-CM

## 2018-04-10 DIAGNOSIS — N952 Postmenopausal atrophic vaginitis: Secondary | ICD-10-CM

## 2018-04-10 DIAGNOSIS — Z9289 Personal history of other medical treatment: Secondary | ICD-10-CM

## 2018-04-10 NOTE — Addendum Note (Signed)
Addended by: Nelva Nay on: 04/10/2018 04:39 PM   Modules accepted: Orders

## 2018-04-10 NOTE — Progress Notes (Signed)
    [name] [birth year] [id number]        72 y.o.  G5P5 for breast and pelvic exam.  Doing well without gynecologic complaints  Past medical history,surgical history, problem list, medications, allergies, family history and social history were all reviewed and documented as reviewed in the EPIC chart.  ROS:  Performed with pertinent positives and negatives included in the history, assessment and plan.   Additional significant findings : None   Exam: Caryn Bee assistant Vitals:   04/10/18 1608  BP: 138/80  Weight: 206 lb (93.4 kg)  Height: 5\' 9"  (1.753 m)   Body mass index is 30.42 kg/m.  General appearance:  Normal affect, orientation and appearance. Skin: Grossly normal HEENT: Without gross lesions.  No cervical or supraclavicular adenopathy. Thyroid normal.  Lungs:  Clear without wheezing, rales or rhonchi Cardiac: RR, without RMG Abdominal:  Soft, nontender, without masses, guarding, rebound, organomegaly or hernia Breasts:  Examined lying and sitting.  Right without masses, retractions, discharge or axillary adenopathy.  Left status post mastectomy with reconstruction.  No masses or axillary adenopathy. Pelvic:  Ext, BUS, Vagina: With atrophic changes.  Pap smear of vaginal cuff done  Adnexa: Without masses or tenderness    Anus and perineum: Normal   Rectovaginal: Normal sphincter tone without palpated masses or tenderness.    Assessment/Plan:  72 y.o. G11P5 female for breast and pelvic exam.   1. Postmenopausal.  Status post TAH anterior posterior repair for leiomyoma/menorrhagia in the past. 2. DEXA 2009.  I again strongly recommended she schedule a bone density and she agrees to do so. 3. Colonoscopy 2019.  Repeat at their recommended interval. 4. History of left breast cancer.  Mammography 12/2017.  Continue with annual mammography when due.  Exam today NED. 5. Pap smear 2016.  Pap smear of vaginal cuff done today.  No history of significant abnormal Pap  smears.  Options to stop screening per current screening guidelines reviewed. 6. Health maintenance.  No routine lab work done as patient does this elsewhere.  Follow-up 1 year, sooner as needed.   Anastasio Auerbach MD, 4:32 PM 04/10/2018

## 2018-04-10 NOTE — Patient Instructions (Signed)
Schedule and follow-up for your bone density

## 2018-04-14 LAB — PAP IG W/ RFLX HPV ASCU

## 2018-05-07 ENCOUNTER — Ambulatory Visit (INDEPENDENT_AMBULATORY_CARE_PROVIDER_SITE_OTHER): Payer: Medicare Other

## 2018-05-07 ENCOUNTER — Other Ambulatory Visit: Payer: Self-pay | Admitting: Gynecology

## 2018-05-07 ENCOUNTER — Encounter: Payer: Self-pay | Admitting: Gynecology

## 2018-05-07 DIAGNOSIS — Z01419 Encounter for gynecological examination (general) (routine) without abnormal findings: Secondary | ICD-10-CM

## 2018-05-07 DIAGNOSIS — Z780 Asymptomatic menopausal state: Secondary | ICD-10-CM

## 2018-06-29 NOTE — Progress Notes (Signed)
Subjective:   [name] is a 73 y.o. female who presents for Medicare Annual (Subsequent) preventive examination.  Picks up great grand kids each day and any day school is out.  Review of Systems: No ROS.  Medicare Wellness Visit. Additional risk factors are reflected in the social history. Cardiac Risk Factors include: advanced age (>42men, >23 women);dyslipidemia;hypertension Sleep patterns: sleeps very well Home Safety/Smoke Alarms: Feels safe in home. Smoke alarms in place.  Lives on first floor apt.    Female:     Mammo- utd      Dexa scan- utd       CCS- done 01/2018. Recall 7 yrs Eye- Dr. Katy Fitch yearly.     Objective:     Vitals: BP 138/78 (BP Location: Right Arm, Patient Position: Sitting, Cuff Size: Large)   Pulse 75   Ht 5\' 9"  (1.753 m)   Wt 205 lb 9.6 oz (93.3 kg)   SpO2 98%   BMI 30.36 kg/m   Body mass index is 30.36 kg/m.  Advanced Directives 07/01/2018 01/02/2017 11/19/2016 09/20/2011 09/20/2011  Does Patient Have a Medical Advance Directive? No No No Patient does not have advance directive Patient does not have advance directive  Does patient want to make changes to medical advance directive? Yes (MAU/Ambulatory/Procedural Areas - Information given) - - - -    Tobacco Social History   Tobacco Use  Smoking Status Former Smoker  . Last attempt to quit: 09/20/1998  . Years since quitting: 19.7  Smokeless Tobacco Never Used     Counseling given: Not Answered   Clinical Intake:     Pain : No/denies pain                 Past Medical History:  Diagnosis Date  . Blood transfusion    /w childbirth   . CAD (coronary artery disease)   . Cancer ([place])    L breast - chemotherapy & radiation- 2000  . Colon polyp   . Diverticulitis    2009, while hosp for Diverticulitis, experienced pneumonia   . Fibromyalgia   . Gastritis   . GERD (gastroesophageal reflux disease)   . Hemorrhoids   . Hyperlipidemia   . Hypertension    sees [facility]-  thinks last appt. 2012  . Lymph edema    L arm, treatment 2013- PT until recent weeks   . Personal history of radiation therapy 2000  . Pneumonia    2009   Past Surgical History:  Procedure Laterality Date  . ABDOMINAL ADHESION SURGERY    . ABDOMINAL HYSTERECTOMY     bleeding, leiomyomata  . ANTERIOR AND POSTERIOR VAGINAL REPAIR  2010  . APPENDECTOMY    . BREAST RECONSTRUCTION    . BREAST SURGERY     Mastectomy  . CARDIAC CATHETERIZATION     yrs. ago- Dr. Terrence Dupont- [facility]  . ingrown toenails      R foot & hammer toe surgery   . INGUINAL HERNIA REPAIR  09/27/2011   Procedure: HERNIA REPAIR INGUINAL ADULT;  Surgeon: Gwenyth Ober, MD;  Location: [place];  Service: General;  Laterality: Right;  . KNEE SURGERY    . MASTECTOMY Left 2000  . NASAL SINUS SURGERY    . REDUCTION MAMMAPLASTY Right 2000  . ROTATOR CUFF REPAIR    . TONSILLECTOMY     as a child   Family History  Problem Relation Age of Onset  . Alzheimer's disease Mother   . Breast cancer Sister 51  .  Colon polyps Sister   . Cancer Son        prostate  . Anesthesia problems Neg Hx    Social History   Socioeconomic History  . Marital status: Married    Spouse name: Not on file  . Number of children: Not on file  . Years of education: Not on file  . Highest education level: Not on file  Occupational History  . Not on file  Social Needs  . Financial resource strain: Not on file  . Food insecurity:    Worry: Not on file    Inability: Not on file  . Transportation needs:    Medical: Not on file    Non-medical: Not on file  Tobacco Use  . Smoking status: Former Smoker    Last attempt to quit: 09/20/1998    Years since quitting: 19.7  . Smokeless tobacco: Never Used  Substance and Sexual Activity  . Alcohol use: Yes    Alcohol/week: 0.0 standard drinks    Comment: Rare  . Drug use: No  . Sexual activity: Yes    Comment: 1st intercourse 73 yo-Fewer than 5 partners  Lifestyle  . Physical activity:    Days per  week: Not on file    Minutes per session: Not on file  . Stress: Not on file  Relationships  . Social connections:    Talks on phone: Not on file    Gets together: Not on file    Attends religious service: Not on file    Active member of club or organization: Not on file    Attends meetings of clubs or organizations: Not on file    Relationship status: Not on file  Other Topics Concern  . Not on file  Social History Narrative   Widowed- husband died of Alzheimers in February 02, 2013.    Outpatient Encounter Medications as of 07/01/2018  Medication Sig  . losartan (COZAAR) 100 MG tablet Take 1 tablet (100 mg total) by mouth daily.  . pantoprazole (PROTONIX) 40 MG tablet TAKE 1 TABLET(40 MG) BY MOUTH DAILY  . Probiotic Product (ALIGN PO) Take 1 tablet by mouth daily.   . cyclobenzaprine (FLEXERIL) 5 MG tablet Take 1 tablet (5 mg total) by mouth 3 (three) times daily as needed for muscle spasms. (Patient not taking: Reported on 04/10/2018)  . ibuprofen (ADVIL,MOTRIN) 800 MG tablet Take 1 tablet (800 mg total) by mouth 3 (three) times daily. (Patient not taking: Reported on 07/01/2018)  . potassium chloride SA (K-DUR,KLOR-CON) 20 MEQ tablet TAKE 1 TABLET(20 MEQ) BY MOUTH DAILY (Patient not taking: Reported on 04/10/2018)   No facility-administered encounter medications on file as of 07/01/2018.     Activities of Daily Living In your present state of health, do you have any difficulty performing the following activities: 07/01/2018  Hearing? N  Vision? N  Comment wears progressive lens.   Difficulty concentrating or making decisions? N  Walking or climbing stairs? N  Dressing or bathing? N  Doing errands, shopping? N  Preparing Food and eating ? N  Using the Toilet? N  In the past six months, have you accidently leaked urine? N  Do you have problems with loss of bowel control? N  Managing your Medications? N  Managing your Finances? N  Housekeeping or managing your Housekeeping? N  Some  recent data might be hidden    Patient Care Team: Lucille Passy, MD as PCP - General (Family Medicine) Fontaine, Belinda Block, MD as Consulting Physician (Gynecology) Magrinat,  Virgie Dad, MD as Consulting Physician (Oncology) Juanita Craver, MD as Consulting Physician (Gastroenterology) Charolette Forward, MD as Consulting Physician (Cardiology)    Assessment:   This is a routine wellness examination for [name]. Physical assessment deferred to PCP.  Exercise Activities and Dietary recommendations Current Exercise Habits: Home exercise routine, Type of exercise: treadmill;strength training/weights, Time (Minutes): 60, Frequency (Times/Week): 5, Weekly Exercise (Minutes/Week): 300, Intensity: Mild, Exercise limited by: None identified Diet (meal preparation, eat out, water intake, caffeinated beverages, dairy products, fruits and vegetables): in general, a "healthy" diet  , well balanced, on average, 3 meals per day   Goals    . Increase physical activity     Starting 01/02/2017, I will continue to exercise for at 60 min twice weekly.        Fall Risk Fall Risk  07/01/2018 03/11/2018 01/02/2017 11/11/2016 02/21/2015  Falls in the past year? 0 0 No No Yes  Comment - Emmi Telephone Survey: data to providers prior to load - Emmi Telephone Survey: data to providers prior to load -  Number falls in past yr: - - - - 1  Injury with Fall? - - - - No   Depression Screen PHQ 2/9 Scores 07/01/2018 01/02/2017 02/21/2015  PHQ - 2 Score 0 0 0  PHQ- 9 Score - 0 -     Cognitive Function Ad8 score reviewed for issues:  Issues making decisions:no  Less interest in hobbies / activities:no  Repeats questions, stories (family complaining):no  Trouble using ordinary gadgets (microwave, computer, phone):no  Forgets the month or year: no  Mismanaging finances: no  Remembering appts:no Daily problems with thinking and/or memory:no Ad8 score is=0     MMSE - Mini Mental State Exam 01/02/2017    Orientation to time 5  Orientation to Place 5  Registration 3  Attention/ Calculation 0  Recall 2  Recall-comments pt was unable to recall 1 of 3 words  Language- name 2 objects 0  Language- repeat 1  Language- follow 3 step command 3  Language- read & follow direction 0  Write a sentence 0  Copy design 0  Total score 19        Immunization History  Administered Date(s) Administered  . Influenza, High Dose Seasonal PF 02/11/2017, 01/19/2018  . Influenza,inj,Quad PF,6+ Mos 02/23/2013, 02/02/2015, 01/26/2016  . Pneumococcal Conjugate-13 02/21/2015  . Pneumococcal Polysaccharide-23 07/17/2017   Screening Tests Health Maintenance  Topic Date Due  . TETANUS/TDAP  01/03/2027 (Originally 05/02/1964)  . MAMMOGRAM  01/07/2020  . COLONOSCOPY  02/17/2023  . INFLUENZA VACCINE  Completed  . DEXA SCAN  Completed  . Hepatitis C Screening  Completed  . PNA vac Low Risk Adult  Completed        Plan:    Please schedule your next medicare wellness visit with me in 1 yr.  Continue to eat healthy  Continue to exercise  Bring a copy of your living will and/or healthcare power of attorney to your next office visit.   I have personally reviewed and noted the following in the patient's chart:   . Medical and social history . Use of alcohol, tobacco or illicit drugs  . Current medications and supplements . Functional ability and status . Nutritional status . Physical activity . Advanced directives . List of other physicians . Hospitalizations, surgeries, and ER visits in previous 12 months . Vitals . Screenings to include cognitive, depression, and falls . Referrals and appointments  In addition, I have reviewed and discussed with patient certain  preventive protocols, quality metrics, and best practice recommendations. A written personalized care plan for preventive services as well as general preventive health recommendations were provided to patient.     [name], [place]  07/01/2018

## 2018-07-01 ENCOUNTER — Ambulatory Visit (INDEPENDENT_AMBULATORY_CARE_PROVIDER_SITE_OTHER): Payer: Medicare Other | Admitting: *Deleted

## 2018-07-01 ENCOUNTER — Other Ambulatory Visit: Payer: Self-pay

## 2018-07-01 ENCOUNTER — Encounter: Payer: Self-pay | Admitting: *Deleted

## 2018-07-01 VITALS — BP 138/78 | HR 75 | Ht 69.0 in | Wt 205.6 lb

## 2018-07-01 DIAGNOSIS — Z0000 Encounter for general adult medical examination without abnormal findings: Secondary | ICD-10-CM | POA: Diagnosis not present

## 2018-07-01 NOTE — Patient Instructions (Signed)
Please schedule your next medicare wellness visit with me in 1 yr.  Continue to eat healthy  Continue to exercise  Bring a copy of your living will and/or healthcare power of attorney to your next office visit.   Ms. Clauson , Thank you for taking time to come for your Medicare Wellness Visit. I appreciate your ongoing commitment to your health goals. Please review the following plan we discussed and let me know if I can assist you in the future.   These are the goals we discussed: Goals    . Increase physical activity     Starting 01/02/2017, I will continue to exercise for at 60 min twice weekly.        This is a list of the screening recommended for you and due dates:  Health Maintenance  Topic Date Due  . Tetanus Vaccine  01/03/2027*  . Mammogram  01/07/2020  . Colon Cancer Screening  02/17/2023  . Flu Shot  Completed  . DEXA scan (bone density measurement)  Completed  .  Hepatitis C: One time screening is recommended by Center for Disease Control  (CDC) for  adults born from [birth year] through 1965.   Completed  . Pneumonia vaccines  Completed  *Topic was postponed. The date shown is not the original due date.    Health Maintenance After Age 69 After age 43, you are at a higher risk for certain long-term diseases and infections as well as injuries from falls. Falls are a major cause of broken bones and head injuries in people who are older than age 14. Getting regular preventive care can help to keep you healthy and well. Preventive care includes getting regular testing and making lifestyle changes as recommended by your health care provider. Talk with your health care provider about:  Which screenings and tests you should have. A screening is a test that checks for a disease when you have no symptoms.  A diet and exercise plan that is right for you. What should I know about screenings and tests to prevent falls? Screening and testing are the best ways to find a health problem  early. Early diagnosis and treatment give you the best chance of managing medical conditions that are common after age 45. Certain conditions and lifestyle choices may make you more likely to have a fall. Your health care provider may recommend:  Regular vision checks. Poor vision and conditions such as cataracts can make you more likely to have a fall. If you wear glasses, make sure to get your prescription updated if your vision changes.  Medicine review. Work with your health care provider to regularly review all of the medicines you are taking, including over-the-counter medicines. Ask your health care provider about any side effects that may make you more likely to have a fall. Tell your health care provider if any medicines that you take make you feel dizzy or sleepy.  Osteoporosis screening. Osteoporosis is a condition that causes the bones to get weaker. This can make the bones weak and cause them to break more easily.  Blood pressure screening. Blood pressure changes and medicines to control blood pressure can make you feel dizzy.  Strength and balance checks. Your health care provider may recommend certain tests to check your strength and balance while standing, walking, or changing positions.  Foot health exam. Foot pain and numbness, as well as not wearing proper footwear, can make you more likely to have a fall.  Depression screening. You may be more  likely to have a fall if you have a fear of falling, feel emotionally low, or feel unable to do activities that you used to do.  Alcohol use screening. Using too much alcohol can affect your balance and may make you more likely to have a fall. What actions can I take to lower my risk of falls? General instructions  Talk with your health care provider about your risks for falling. Tell your health care provider if: ? You fall. Be sure to tell your health care provider about all falls, even ones that seem minor. ? You feel dizzy, sleepy,  or off-balance.  Take over-the-counter and prescription medicines only as told by your health care provider. These include any supplements.  Eat a healthy diet and maintain a healthy weight. A healthy diet includes low-fat dairy products, low-fat (lean) meats, and fiber from whole grains, beans, and lots of fruits and vegetables. Home safety  Remove any tripping hazards, such as rugs, cords, and clutter.  Install safety equipment such as grab bars in bathrooms and safety rails on stairs.  Keep rooms and walkways well-lit. Activity   Follow a regular exercise program to stay fit. This will help you maintain your balance. Ask your health care provider what types of exercise are appropriate for you.  If you need a cane or walker, use it as recommended by your health care provider.  Wear supportive shoes that have nonskid soles. Lifestyle  Do not drink alcohol if your health care provider tells you not to drink.  If you drink alcohol, limit how much you have: ? 0-1 drink a day for women. ? 0-2 drinks a day for men.  Be aware of how much alcohol is in your drink. In the [place] one drink equals one typical bottle of beer (12 oz), one-half glass of wine (5 oz), or one shot of hard liquor (1 oz).  Do not use any products that contain nicotine or tobacco, such as cigarettes and e-cigarettes. If you need help quitting, ask your health care provider. Summary  Having a healthy lifestyle and getting preventive care can help to protect your health and wellness after age 48.  Screening and testing are the best way to find a health problem early and help you avoid having a fall. Early diagnosis and treatment give you the best chance for managing medical conditions that are more common for people who are older than age 57.  Falls are a major cause of broken bones and head injuries in people who are older than age 26. Take precautions to prevent a fall at home.  Work with your health care  provider to learn what changes you can make to improve your health and wellness and to prevent falls. This information is not intended to replace advice given to you by your health care provider. Make sure you discuss any questions you have with your health care provider. Document Released: 02/19/2017 Document Revised: 02/19/2017 Document Reviewed: 02/19/2017 Elsevier Interactive Patient Education  2019 [employer].

## 2018-07-01 NOTE — Progress Notes (Signed)
I reviewed health advisor's note, was available for consultation, and agree with documentation and plan.  

## 2018-07-14 ENCOUNTER — Ambulatory Visit: Payer: Medicare Other | Admitting: Family Medicine

## 2018-07-14 ENCOUNTER — Ambulatory Visit: Payer: Self-pay | Admitting: *Deleted

## 2018-07-14 ENCOUNTER — Other Ambulatory Visit: Payer: Self-pay

## 2018-07-14 ENCOUNTER — Emergency Department (HOSPITAL_COMMUNITY): Payer: Medicare Other

## 2018-07-14 ENCOUNTER — Encounter (HOSPITAL_COMMUNITY): Payer: Self-pay

## 2018-07-14 ENCOUNTER — Emergency Department (HOSPITAL_COMMUNITY)
Admission: EM | Admit: 2018-07-14 | Discharge: 2018-07-14 | Disposition: A | Discharge status: Home / Self Care | Payer: Medicare Other | Attending: Emergency Medicine | Admitting: Emergency Medicine

## 2018-07-14 DIAGNOSIS — R42 Dizziness and giddiness: Secondary | ICD-10-CM | POA: Diagnosis not present

## 2018-07-14 DIAGNOSIS — Z853 Personal history of malignant neoplasm of breast: Secondary | ICD-10-CM | POA: Diagnosis not present

## 2018-07-14 DIAGNOSIS — I2510 Atherosclerotic heart disease of native coronary artery without angina pectoris: Secondary | ICD-10-CM | POA: Diagnosis not present

## 2018-07-14 DIAGNOSIS — Z87891 Personal history of nicotine dependence: Secondary | ICD-10-CM | POA: Insufficient documentation

## 2018-07-14 DIAGNOSIS — Z79899 Other long term (current) drug therapy: Secondary | ICD-10-CM | POA: Insufficient documentation

## 2018-07-14 DIAGNOSIS — I10 Essential (primary) hypertension: Secondary | ICD-10-CM | POA: Diagnosis not present

## 2018-07-14 DIAGNOSIS — R112 Nausea with vomiting, unspecified: Secondary | ICD-10-CM | POA: Diagnosis not present

## 2018-07-14 LAB — CBC WITH DIFFERENTIAL/PLATELET
Abs Immature Granulocytes: 0.01 10*3/uL (ref 0.00–0.07)
Basophils Absolute: 0 10*3/uL (ref 0.0–0.1)
Basophils Relative: 0 %
Eosinophils Absolute: 0.1 10*3/uL (ref 0.0–0.5)
Eosinophils Relative: 1 %
HCT: 42.6 % (ref 36.0–46.0)
Hemoglobin: 13 g/dL (ref 12.0–15.0)
Immature Granulocytes: 0 %
Lymphocytes Relative: 20 %
Lymphs Abs: 1.2 10*3/uL (ref 0.7–4.0)
MCH: 25.1 pg — ABNORMAL LOW (ref 26.0–34.0)
MCHC: 30.5 g/dL (ref 30.0–36.0)
MCV: 82.4 fL (ref 80.0–100.0)
Monocytes Absolute: 0.3 10*3/uL (ref 0.1–1.0)
Monocytes Relative: 6 %
Neutro Abs: 4.2 10*3/uL (ref 1.7–7.7)
Neutrophils Relative %: 73 %
Platelets: 233 10*3/uL (ref 150–400)
RBC: 5.17 MIL/uL — ABNORMAL HIGH (ref 3.87–5.11)
RDW: 13.2 % (ref 11.5–15.5)
WBC: 5.8 10*3/uL (ref 4.0–10.5)
nRBC: 0 % (ref 0.0–0.2)

## 2018-07-14 LAB — COMPREHENSIVE METABOLIC PANEL
ALT: 10 U/L (ref 0–44)
AST: 18 U/L (ref 15–41)
Albumin: 4.2 g/dL (ref 3.5–5.0)
Alkaline Phosphatase: 118 U/L (ref 38–126)
Anion gap: 10 (ref 5–15)
BUN: 5 mg/dL — ABNORMAL LOW (ref 8–23)
CO2: 26 mmol/L (ref 22–32)
Calcium: 10.5 mg/dL — ABNORMAL HIGH (ref 8.9–10.3)
Chloride: 104 mmol/L (ref 98–111)
Creatinine, Ser: 0.77 mg/dL (ref 0.44–1.00)
GFR calc Af Amer: >60 mL/min (ref >60)
GFR calc non Af Amer: >60 mL/min (ref >60)
Glucose, Bld: 100 mg/dL — ABNORMAL HIGH (ref 70–99)
Potassium: 3.9 mmol/L (ref 3.5–5.1)
Sodium: 140 mmol/L (ref 135–145)
Total Bilirubin: 0.6 mg/dL (ref 0.3–1.2)
Total Protein: 7 g/dL (ref 6.5–8.1)

## 2018-07-14 MED ORDER — MECLIZINE HCL 25 MG PO TABS: 25.0000 mg | ORAL_TABLET | Freq: Three times a day (TID) | ORAL | 0 refills | Status: DC | PRN

## 2018-07-14 MED ORDER — DIAZEPAM 5 MG PO TABS
5.0000 mg | ORAL_TABLET | Freq: Once | ORAL | Status: AC
  Administered 2018-07-14: 5 mg via ORAL
  Filled 2018-07-14: qty 1

## 2018-07-14 MED ORDER — MECLIZINE HCL 25 MG PO TABS
25.0000 mg | ORAL_TABLET | Freq: Once | ORAL | Status: DC
  Filled 2018-07-14: qty 1

## 2018-07-14 MED ORDER — SODIUM CHLORIDE 0.9 % IV BOLUS
1000.0000 mL | Freq: Once | INTRAVENOUS | Status: AC
  Administered 2018-07-14: 1000 mL via INTRAVENOUS

## 2018-07-14 MED ORDER — MECLIZINE HCL 25 MG PO TABS
25.0000 mg | ORAL_TABLET | Freq: Once | ORAL | Status: AC
  Administered 2018-07-14: 25 mg via ORAL
  Filled 2018-07-14: qty 1

## 2018-07-14 NOTE — Telephone Encounter (Signed)
  Patient is calling with nausea and vomiting that started last night- patient has not eaten or drank anything today- feels dizzy when moves in bed. No medications today. Per office-agree with disposition- ED. Patient advised and she will go- although she does not want to. Reason for Disposition . [1] SEVERE vomiting (e.g., 6 or more times/day) AND [2] present > 8 hours  Answer Assessment - Initial Assessment Questions 1. VOMITING SEVERITY: "How many times have you vomited in the past 24 hours?"     - MILD:  1 - 2 times/day    - MODERATE: 3 - 5 times/day, decreased oral intake without significant weight loss or symptoms of dehydration    - SEVERE: 6 or more times/day, vomits everything or nearly everything, with significant weight loss, symptoms of dehydration      Moderate- patient has not eaten today 2. ONSET: "When did the vomiting begin?"     Started 8:30 last night 3. FLUIDS: "What fluids or food have you vomited up today?" "Have you been able to keep any fluids down?"     Stomach acid today, no fluids today 4. ABDOMINAL PAIN: "Are your having any abdominal pain?" If yes : "How bad is it and what does it feel like?" (e.g., crampy, dull, intermittent, constant)      No abdominal pain at this- she did have stomach upset before vomited last night 5. DIARRHEA: "Is there any diarrhea?" If so, ask: "How many times today?"      no 6. CONTACTS: "Is there anyone else in the family with the same symptoms?"      No 7. CAUSE: "What do you think is causing your vomiting?"     Possibly ate something bad-hotdog 8. HYDRATION STATUS: "Any signs of dehydration?" (e.g., dry mouth [not only dry lips], too weak to stand) "When did you last urinate?"     Dizziness- feels sick to drink,dry mouth and throat 9. OTHER SYMPTOMS: "Do you have any other symptoms?" (e.g., fever, headache, vertigo, vomiting blood or coffee grounds, recent head injury)     dizziness 10. PREGNANCY: "Is there any chance you are  pregnant?" "When was your last menstrual period?"       n/a  Protocols used: [facility]

## 2018-07-14 NOTE — Telephone Encounter (Signed)
I agree with your assessment.  Thank you.

## 2018-07-14 NOTE — ED Notes (Signed)
Patient transported to MRI 

## 2018-07-14 NOTE — ED Triage Notes (Signed)
Pt from home, c/o emesis and dizziness that began last night; pt unable to keep down food and medication today; denies diarrhea, denies fevers; dizziness worsens with movement

## 2018-07-14 NOTE — ED Notes (Signed)
EDP notified of R facial droop

## 2018-07-14 NOTE — ED Notes (Signed)
Attempted to ambulate patient. Once the patient sat up on the side of the bed she c/o extreme dizziness and was unable to ambulate at this time. MD made aware.

## 2018-07-14 NOTE — ED Notes (Signed)
Patient ambulated to the bathroom, denies dizziness at this time. MD made aware.

## 2018-07-14 NOTE — ED Notes (Signed)
Patient verbalizes understanding of medications and discharge instructions. No further questions at this time. VSS and patient ambulatory at discharge.   

## 2018-07-14 NOTE — Discharge Instructions (Addendum)
Return here as needed.  Your testing did not show any significant abnormality and your MRI did not show any signs of stroke.  Follow-up with your primary doctor.  I feel that this is a positional vertigo.

## 2018-07-14 NOTE — Telephone Encounter (Signed)
TA-I spoke with Valli Glance, RN/We agreed that pt would be better off going to the hospital for hydration and to make sure her K is ok since she takes daily K/plz advise if there is anything else you need me to do/thx dmf

## 2018-07-14 NOTE — ED Provider Notes (Signed)
[place] EMERGENCY DEPARTMENT Provider Note   CSN: [id number] Arrival date & time: 07/14/18  1633    History   Chief Complaint Chief Complaint  Patient presents with  . Dizziness  . Emesis    HPI [name] is a 73 y.o. female.     HPI Patient presents to the emergency department with dizziness that started last night.  Patient states she has had nausea and vomiting with this.  She states the position seems to make her condition worse.  She states when she stands up her symptoms worsen when she turns her head to the side she gets the symptoms of vomiting.  She states she took Dramamine at home without significant relief of her symptoms.  The patient denies chest pain, shortness of breath, headache,blurred vision, neck pain, fever, cough, weakness, numbness, anorexia, edema, abdominal pain, nausea, vomiting, diarrhea, rash, back pain, dysuria, hematemesis, bloody stool, near syncope, or syncope. Past Medical History:  Diagnosis Date  . Blood transfusion    /w childbirth   . CAD (coronary artery disease)   . Cancer ([place])    L breast - chemotherapy & radiation- 2000  . Colon polyp   . Diverticulitis    2009, while hosp for Diverticulitis, experienced pneumonia   . Fibromyalgia   . Gastritis   . GERD (gastroesophageal reflux disease)   . Hemorrhoids   . Hyperlipidemia   . Hypertension    sees [facility]- thinks last appt. 2012  . Lymph edema    L arm, treatment 2013- PT until recent weeks   . Personal history of radiation therapy 2000  . Pneumonia    2009    Patient Active Problem List   Diagnosis Date Noted  . Cervical radiculopathy 07/07/2017  . Hypokalemia 01/14/2014  . Insomnia 01/14/2014  . HLD (hyperlipidemia) 12/14/2013  . HTN (hypertension) 12/14/2013  . GERD (gastroesophageal reflux disease) 12/14/2013  . CAD (coronary artery disease) 12/14/2013  . Depression 12/14/2013  . History of breast cancer in female 12/14/2013  . Edema  12/14/2013  . Fibromyalgia 12/14/2013  . Left hip pain 12/14/2013    Past Surgical History:  Procedure Laterality Date  . ABDOMINAL ADHESION SURGERY    . ABDOMINAL HYSTERECTOMY     bleeding, leiomyomata  . ANTERIOR AND POSTERIOR VAGINAL REPAIR  2010  . APPENDECTOMY    . BREAST RECONSTRUCTION    . BREAST SURGERY     Mastectomy  . CARDIAC CATHETERIZATION     yrs. ago- Dr. Terrence Dupont- [facility]  . ingrown toenails      R foot & hammer toe surgery   . INGUINAL HERNIA REPAIR  09/27/2011   Procedure: HERNIA REPAIR INGUINAL ADULT;  Surgeon: Gwenyth Ober, MD;  Location: [place];  Service: General;  Laterality: Right;  . KNEE SURGERY    . MASTECTOMY Left 2000  . NASAL SINUS SURGERY    . REDUCTION MAMMAPLASTY Right 2000  . ROTATOR CUFF REPAIR    . TONSILLECTOMY     as a child     OB History    Gravida  5   Para  5   Term      Preterm      AB      Living  4     SAB      TAB      Ectopic      Multiple      Live Births  Home Medications    Prior to Admission medications   Medication Sig Start Date End Date Taking? Authorizing Provider  BEE POLLEN PO Take 1 Container by mouth daily.   Yes [provider]  cetirizine (ZYRTEC) 10 MG tablet Take 10 mg by mouth daily.   Yes [provider]  ibuprofen (ADVIL,MOTRIN) 800 MG tablet Take 1 tablet (800 mg total) by mouth 3 (three) times daily. 12/21/17  Yes Raylene Everts, MD  losartan (COZAAR) 100 MG tablet Take 1 tablet (100 mg total) by mouth daily. 07/03/17  Yes Lucille Passy, MD  pantoprazole (PROTONIX) 40 MG tablet TAKE 1 TABLET(40 MG) BY MOUTH DAILY Patient taking differently: Take 40 mg by mouth daily.  11/17/17  Yes Lucille Passy, MD  potassium chloride SA (K-DUR,KLOR-CON) 20 MEQ tablet TAKE 1 TABLET(20 MEQ) BY MOUTH DAILY Patient taking differently: Take 20 mEq by mouth daily.  12/15/17  Yes Lucille Passy, MD  Probiotic Product (ALIGN PO) Take 1 tablet by mouth daily.    Yes [provider]    Family History Family History  Problem Relation Age of Onset  . Alzheimer's disease Mother   . Breast cancer Sister 66  . Colon polyps Sister   . Cancer Son        prostate  . Anesthesia problems Neg Hx     Social History Social History   Tobacco Use  . Smoking status: Former Smoker    Last attempt to quit: 09/20/1998    Years since quitting: 19.8  . Smokeless tobacco: Never Used  Substance Use Topics  . Alcohol use: Not Currently    Alcohol/week: 0.0 standard drinks    Comment: Rare  . Drug use: No     Allergies   Codeine and Statins   Review of Systems Review of Systems All other systems negative except as documented in the HPI. All pertinent positives and negatives as reviewed in the HPI.  Physical Exam Updated Vital Signs BP (!) 129/56   Pulse 70   Temp 98.1 F (36.7 C) (Oral)   Resp 17   Ht 5\' 9"  (1.753 m)   Wt 93.3 kg   SpO2 100%   BMI 30.36 kg/m   Physical Exam Vitals signs and nursing note reviewed.  Constitutional:      General: She is not in acute distress.    Appearance: She is well-developed.  HENT:     Head: Normocephalic and atraumatic.  Eyes:     Pupils: Pupils are equal, round, and reactive to light.  Neck:     Musculoskeletal: Normal range of motion and neck supple.  Cardiovascular:     Rate and Rhythm: Normal rate and regular rhythm.     Heart sounds: Normal heart sounds. No murmur. No friction rub. No gallop.   Pulmonary:     Effort: Pulmonary effort is normal. No respiratory distress.     Breath sounds: Normal breath sounds. No wheezing.  Abdominal:     General: Bowel sounds are normal. There is no distension.     Palpations: Abdomen is soft.     Tenderness: There is no abdominal tenderness.  Skin:    General: Skin is warm and dry.     Capillary Refill: Capillary refill takes less than 2 seconds.     Findings: No erythema or rash.  Neurological:     General: No focal deficit present.     Mental Status:  She is alert and oriented to person, place, and  time.     Cranial Nerves: No cranial nerve deficit.     Sensory: No sensory deficit.     Motor: No weakness or abnormal muscle tone.     Coordination: Coordination normal.     Gait: Gait normal.  Psychiatric:        Mood and Affect: Mood normal.        Behavior: Behavior normal.      ED Treatments / Results  Labs (all labs ordered are listed, but only abnormal results are displayed) Labs Reviewed  COMPREHENSIVE METABOLIC PANEL - Abnormal; Notable for the following components:      Result Value   Glucose, Bld 100 (*)    BUN 5 (*)    Calcium 10.5 (*)    All other components within normal limits  CBC WITH DIFFERENTIAL/PLATELET - Abnormal; Notable for the following components:   RBC 5.17 (*)    MCH 25.1 (*)    All other components within normal limits  URINALYSIS, ROUTINE W REFLEX MICROSCOPIC    EKG EKG Interpretation  Date/Time:  Tuesday July 14 2018 16:58:40 EDT Ventricular Rate:  64 PR Interval:    QRS Duration: 85 QT Interval:  407 QTC Calculation: 420 R Axis:   58 Text Interpretation:  Sinus rhythm no acute st/ts similar to prior 6/13 Confirmed by Aletta Edouard [phone]) on 07/14/2018 5:35:04 PM   Radiology Mr Brain Wo Contrast (neuro Protocol)  Result Date: 07/14/2018 CLINICAL DATA:  Dizziness and vomiting beginning last night. EXAM: MRI HEAD WITHOUT CONTRAST TECHNIQUE: Multiplanar, multiecho pulse sequences of the brain and surrounding structures were obtained without intravenous contrast. COMPARISON:  None. FINDINGS: Brain: Diffusion imaging does not show any acute or subacute infarction. The brainstem and cerebellum are normal. Cerebral hemispheres show mild chronic small-vessel ischemic change of the white matter, less than often seen at this age. No mass lesion, hemorrhage, hydrocephalus or extra-axial collection. Vascular: Major vessels at the base of the brain show flow. Skull and upper cervical spine: Negative  Sinuses/Orbits: Clear/normal Other: None IMPRESSION: No acute finding. No cause of the presenting symptoms is identified. Normal for age, with only mild chronic appearing small vessel ischemic change of the cerebral hemispheric white matter. Electronically Signed   By: Nelson Chimes M.D.   On: 07/14/2018 19:42    Procedures Procedures (including critical care time)  Medications Ordered in ED Medications  sodium chloride 0.9 % bolus 1,000 mL (0 mLs Intravenous Stopped 07/14/18 2047)  diazepam (VALIUM) tablet 5 mg (5 mg Oral Given 07/14/18 1829)  meclizine (ANTIVERT) tablet 25 mg (25 mg Oral Given 07/14/18 2046)     Initial Impression / Assessment and Plan / ED Course  I have reviewed the triage vital signs and the nursing notes.  Pertinent labs & imaging results that were available during my care of the patient were reviewed by me and considered in my medical decision making (see chart for details).  Clinical Course as of Jul 13 2136  Tue Jul 14, 2018  2023 She is here with dizziness and nausea that started last evening.  Worse with movement.  She has had lab work and an MRI that are fairly unremarkable.  We is try to get her up and she is still symptomatic.  If she does not improve with medication she possibly will need to be admitted for further management.   [MB]    Clinical Course User Index [MB] Hayden Rasmussen, MD       I feel that the patient's symptoms  are peripheral vertigo.  The patient is a negative MRI at this time.  The rest of her laboratory testing does not yield any significant abnormalities.  Patient is advised to follow-up with her doctor.  Told to increase her fluid intake and rest as much as possible.  Patient had resolution recently through the meclizine.  She was able to ambulate without difficulty.  Final Clinical Impressions(s) / ED Diagnoses   Final diagnoses:  None    ED Discharge Orders    None       Rebeca Allegra 07/15/18 0008     Hayden Rasmussen, MD 07/15/18 1249

## 2018-07-21 ENCOUNTER — Ambulatory Visit (INDEPENDENT_AMBULATORY_CARE_PROVIDER_SITE_OTHER): Payer: Medicare Other | Admitting: Family Medicine

## 2018-07-21 ENCOUNTER — Encounter: Payer: Self-pay | Admitting: Family Medicine

## 2018-07-21 DIAGNOSIS — R42 Dizziness and giddiness: Secondary | ICD-10-CM | POA: Insufficient documentation

## 2018-07-21 MED ORDER — PANTOPRAZOLE SODIUM 40 MG PO TBEC: DELAYED_RELEASE_TABLET | ORAL | 5 refills | Status: DC

## 2018-07-21 MED ORDER — MECLIZINE HCL 25 MG PO TABS: 25.0000 mg | ORAL_TABLET | Freq: Three times a day (TID) | ORAL | 0 refills | Status: DC | PRN

## 2018-07-21 MED ORDER — LOSARTAN POTASSIUM 100 MG PO TABS: 100.0000 mg | ORAL_TABLET | Freq: Every day | ORAL | 0 refills | Status: DC

## 2018-07-21 NOTE — Progress Notes (Signed)
Virtual Visit via Video   I connected with [place] on 07/21/18 at 10:20 AM EDT by a video enabled telemedicine application and verified that I am speaking with the correct person using two identifiers. Location patient: Home Location provider: [facility], Office Persons participating in the virtual visit: [place] Keisling, Arnette Norris, MD   I discussed the limitations of evaluation and management by telemedicine and the availability of in person appointments. The patient expressed understanding and agreed to proceed.  Subjective:   HPI:   She was seen at [facility] on 3.24.20 for dizziness and nausea for 24h that worsened with movement.   Notes reviewed.  Assessment and Plan were as follows:  I feel that the patient's symptoms are peripheral vertigo.  The patient is a negative MRI at this time.  The rest of her laboratory testing does not yield any significant abnormalities.  Patient is advised to follow-up with her doctor.  Told to increase her fluid intake and rest as much as possible.  Patient had resolution recently through the meclizine.  She was able to ambulate without difficulty.  Mr Brain Wo Contrast (neuro Protocol)  Result Date: 07/14/2018 CLINICAL DATA:  Dizziness and vomiting beginning last night. EXAM: MRI HEAD WITHOUT CONTRAST TECHNIQUE: Multiplanar, multiecho pulse sequences of the brain and surrounding structures were obtained without intravenous contrast. COMPARISON:  None. FINDINGS: Brain: Diffusion imaging does not show any acute or subacute infarction. The brainstem and cerebellum are normal. Cerebral hemispheres show mild chronic small-vessel ischemic change of the white matter, less than often seen at this age. No mass lesion, hemorrhage, hydrocephalus or extra-axial collection. Vascular: Major vessels at the base of the brain show flow. Skull and upper cervical spine: Negative Sinuses/Orbits: Clear/normal Other: None IMPRESSION: No acute finding. No cause  of the presenting symptoms is identified. Normal for age, with only mild chronic appearing small vessel ischemic change of the cerebral hemispheric white matter. Electronically Signed   By: Nelson Chimes M.D.   On: 07/14/2018 19:42   Recent Results (from the past 2160 hour(s))  Comprehensive metabolic panel     Status: Abnormal   Collection Time: 07/14/18  5:57 PM  Result Value Ref Range   Sodium 140 135 - 145 mmol/L   Potassium 3.9 3.5 - 5.1 mmol/L   Chloride 104 98 - 111 mmol/L   CO2 26 22 - 32 mmol/L   Glucose, Bld 100 (H) 70 - 99 mg/dL   BUN 5 (L) 8 - 23 mg/dL   Creatinine, Ser 0.77 0.44 - 1.00 mg/dL   Calcium 10.5 (H) 8.9 - 10.3 mg/dL   Total Protein 7.0 6.5 - 8.1 g/dL   Albumin 4.2 3.5 - 5.0 g/dL   AST 18 15 - 41 U/L   ALT 10 0 - 44 U/L   Alkaline Phosphatase 118 38 - 126 U/L   Total Bilirubin 0.6 0.3 - 1.2 mg/dL   GFR calc non Af Amer >60 >60 mL/min   GFR calc Af Amer >60 >60 mL/min   Anion gap 10 5 - 15    Comment: Performed at [facility], [address] [place], [place] [address]  CBC with Differential     Status: Abnormal   Collection Time: 07/14/18  5:57 PM  Result Value Ref Range   WBC 5.8 4.0 - 10.5 K/uL   RBC 5.17 (H) 3.87 - 5.11 MIL/uL   Hemoglobin 13.0 12.0 - 15.0 g/dL   HCT 42.6 36.0 - 46.0 %  MCV 82.4 80.0 - 100.0 fL   MCH 25.1 (L) 26.0 - 34.0 pg   MCHC 30.5 30.0 - 36.0 g/dL   RDW 13.2 11.5 - 15.5 %   Platelets 233 150 - 400 K/uL   nRBC 0.0 0.0 - 0.2 %   Neutrophils Relative % 73 %   Neutro Abs 4.2 1.7 - 7.7 K/uL   Lymphocytes Relative 20 %   Lymphs Abs 1.2 0.7 - 4.0 K/uL   Monocytes Relative 6 %   Monocytes Absolute 0.3 0.1 - 1.0 K/uL   Eosinophils Relative 1 %   Eosinophils Absolute 0.1 0.0 - 0.5 K/uL   Basophils Relative 0 %   Basophils Absolute 0.0 0.0 - 0.1 K/uL   Immature Granulocytes 0 %   Abs Immature Granulocytes 0.01 0.00 - 0.07 K/uL    Comment: Performed at [facility] [address], [place], [place] [address]   She was  given Mirtazapine tid prn and sent home. When she takes it the nausea subsides. The dizziness is not as much of a problem and nausea is improving too.  Symptoms definitely worse when she changes head position, especially first thing in the morning.   . Review of Systems  Constitutional: Negative.   HENT: Negative.   Respiratory: Negative.   Cardiovascular: Negative.   Gastrointestinal: Positive for nausea. Negative for diarrhea and vomiting.  Genitourinary: Negative.   Musculoskeletal: Negative.   Skin: Negative.   Neurological: Positive for dizziness and facial asymmetry. Negative for tremors, seizures, syncope, speech difficulty, weakness, light-headedness, numbness and headaches.  All other systems reviewed and are negative.    ROS: See pertinent positives and negatives per HPI.  Patient Active Problem List   Diagnosis Date Noted  . Cervical radiculopathy 07/07/2017  . Hypokalemia 01/14/2014  . Insomnia 01/14/2014  . HLD (hyperlipidemia) 12/14/2013  . HTN (hypertension) 12/14/2013  . GERD (gastroesophageal reflux disease) 12/14/2013  . CAD (coronary artery disease) 12/14/2013  . Depression 12/14/2013  . History of breast cancer in female 12/14/2013  . Edema 12/14/2013  . Fibromyalgia 12/14/2013  . Left hip pain 12/14/2013    Social History   Tobacco Use  . Smoking status: Former Smoker    Last attempt to quit: 09/20/1998    Years since quitting: 19.8  . Smokeless tobacco: Never Used  Substance Use Topics  . Alcohol use: Not Currently    Alcohol/week: 0.0 standard drinks    Comment: Rare    Current Outpatient Medications:  .  acetaminophen (TYLENOL) 325 MG tablet, Take 650 mg by mouth every 6 (six) hours as needed., Disp: , Rfl:  .  BEE POLLEN PO, Take 1 Container by mouth daily., Disp: , Rfl:  .  cetirizine (ZYRTEC) 10 MG tablet, Take 10 mg by mouth daily., Disp: , Rfl:  .  losartan (COZAAR) 100 MG tablet, Take 1 tablet (100 mg total) by mouth daily., Disp: 90  tablet, Rfl: 0 .  meclizine (ANTIVERT) 25 MG tablet, Take 1 tablet (25 mg total) by mouth 3 (three) times daily as needed for dizziness., Disp: 90 tablet, Rfl: 0 .  pantoprazole (PROTONIX) 40 MG tablet, TAKE 1 TABLET(40 MG) BY MOUTH DAILY, Disp: 30 tablet, Rfl: 5 .  Probiotic Product (ALIGN PO), Take 1 tablet by mouth daily. , Disp: , Rfl:  .  potassium chloride SA (K-DUR,KLOR-CON) 20 MEQ tablet, TAKE 1 TABLET(20 MEQ) BY MOUTH DAILY (Patient not taking: No sig reported), Disp: 30 tablet, Rfl: 0  Allergies  Allergen Reactions  .  Codeine Itching  . Statins Other (See Comments)    Muscle aches    Objective:   VITALS: Per patient if applicable, see vitals. GENERAL: Alert, appears well and in no acute distress. HEENT: Atraumatic, conjunctiva clear, no obvious abnormalities on inspection of external nose and ears. NECK: Normal movements of the head and neck. CARDIOPULMONARY: No increased WOB. Speaking in clear sentences. I:E ratio WNL.  MS: Moves all visible extremities without noticeable abnormality. PSYCH: Pleasant and cooperative, well-groomed. Speech normal rate and rhythm. Affect is appropriate. Insight and judgement are appropriate. Attention is focused, linear, and appropriate.  NEURO: CN grossly intact. Oriented as arrived to appointment on time with no prompting. Moves both UE equally.  SKIN: No obvious lesions, wounds, erythema, or cyanosis noted on face or hands.  Assessment and Plan:   There are no diagnoses linked to this encounter.  . Reviewed expectations re: course of current medical issues. . Discussed self-management of symptoms. . Outlined signs and symptoms indicating need for more acute intervention. . Patient verbalized understanding and all questions were answered. [name] Health Maintenance issues including appropriate healthy diet, exercise, and smoking avoidance were discussed with patient. . See orders for this visit as documented in the electronic medical record.   Arnette Norris, MD 07/21/2018

## 2018-07-21 NOTE — Patient Instructions (Signed)
How to Perform the Epley Maneuver  The Epley maneuver is an exercise that relieves symptoms of vertigo. Vertigo is the feeling that you or your surroundings are moving when they are not. When you feel vertigo, you may feel like the room is spinning and have trouble walking. Dizziness is a little different than vertigo. When you are dizzy, you may feel unsteady or light-headed.  You can do this maneuver at home whenever you have symptoms of vertigo. You can do it up to 3 times a day until your symptoms go away.  Even though the Epley maneuver may relieve your vertigo for a few weeks, it is possible that your symptoms will return. This maneuver relieves vertigo, but it does not relieve dizziness.  What are the risks?  If it is done correctly, the Epley maneuver is considered safe. Sometimes it can lead to dizziness or nausea that goes away after a short time. If you develop other symptoms, such as changes in vision, weakness, or numbness, stop doing the maneuver and call your health care provider.  How to perform the Epley maneuver  1. Sit on the edge of a bed or table with your back straight and your legs extended or hanging over the edge of the bed or table.  2. Turn your head halfway toward the affected ear or side.  3. Lie backward quickly with your head turned until you are lying flat on your back. You may want to position a pillow under your shoulders.  4. Hold this position for 30 seconds. You may experience an attack of vertigo. This is normal.  5. Turn your head to the opposite direction until your unaffected ear is facing the floor.  6. Hold this position for 30 seconds. You may experience an attack of vertigo. This is normal. Hold this position until the vertigo stops.  7. Turn your whole body to the same side as your head. Hold for another 30 seconds.  8. Sit back up.  You can repeat this exercise up to 3 times a day.  Follow these instructions at home:   After doing the Epley maneuver, you can return to  your normal activities.   Ask your health care provider if there is anything you should do at home to prevent vertigo. He or she may recommend that you:  ? Keep your head raised (elevated) with two or more pillows while you sleep.  ? Do not sleep on the side of your affected ear.  ? Get up slowly from bed.  ? Avoid sudden movements during the day.  ? Avoid extreme head movement, like looking up or bending over.  Contact a health care provider if:   Your vertigo gets worse.   You have other symptoms, including:  ? Nausea.  ? Vomiting.  ? Headache.  Get help right away if:   You have vision changes.   You have a severe or worsening headache or neck pain.   You cannot stop vomiting.   You have new numbness or weakness in any part of your body.  Summary   Vertigo is the feeling that you or your surroundings are moving when they are not.   The Epley maneuver is an exercise that relieves symptoms of vertigo.   If the Epley maneuver is done correctly, it is considered safe. You can do it up to 3 times a day.  This information is not intended to replace advice given to you by your health care provider. Make sure   you discuss any questions you have with your health care provider.  Document Released: 04/13/2013 Document Revised: 02/27/2016 Document Reviewed: 02/27/2016  Elsevier Interactive Patient Education  2019 [employer]  \

## 2018-07-21 NOTE — Assessment & Plan Note (Signed)
Agree that this seems consistent with BPPV.  Meclizine is helping.  Home Eply removers sent to pt via mychart.  She will update me in 2 weeks.  May require vestibular rehab if symptoms persist beyond this covid crisis. >25 minutes spent in face to face time with patient, >50% spent in counselling or coordination of care

## 2018-09-15 ENCOUNTER — Other Ambulatory Visit: Payer: Self-pay | Admitting: Family Medicine

## 2018-10-20 ENCOUNTER — Other Ambulatory Visit: Payer: Self-pay | Admitting: Family Medicine

## 2018-11-03 ENCOUNTER — Other Ambulatory Visit: Payer: Self-pay | Admitting: Family Medicine

## 2018-12-17 DIAGNOSIS — Z23 Encounter for immunization: Secondary | ICD-10-CM | POA: Diagnosis not present

## 2018-12-18 ENCOUNTER — Other Ambulatory Visit: Payer: Self-pay | Admitting: Family Medicine

## 2019-01-05 ENCOUNTER — Encounter: Payer: Self-pay | Admitting: Gynecology

## 2019-01-05 ENCOUNTER — Other Ambulatory Visit: Payer: Self-pay | Admitting: Family Medicine

## 2019-01-05 ENCOUNTER — Ambulatory Visit (INDEPENDENT_AMBULATORY_CARE_PROVIDER_SITE_OTHER): Payer: Medicare Other | Admitting: Gynecology

## 2019-01-05 ENCOUNTER — Other Ambulatory Visit: Payer: Self-pay

## 2019-01-05 ENCOUNTER — Ambulatory Visit: Payer: Self-pay

## 2019-01-05 VITALS — BP 124/82

## 2019-01-05 DIAGNOSIS — Z1231 Encounter for screening mammogram for malignant neoplasm of breast: Secondary | ICD-10-CM

## 2019-01-05 DIAGNOSIS — N644 Mastodynia: Secondary | ICD-10-CM | POA: Diagnosis not present

## 2019-01-05 NOTE — Telephone Encounter (Signed)
Pt. Reports she noticed swelling to both legs "about a year ago." Left leg is worse, and "it seems to be getting worse." No redness, swelling goes to the ankles. Denies any shortness of breath or chest pain.Warm transfer to [facility] in the practice for a visit. Answer Assessment - Initial Assessment Questions 1. ONSET: "When did the swelling start?" (e.g., minutes, hours, days)     Started a year ago and getting worse 2. LOCATION: "What part of the leg is swollen?"  "Are both legs swollen or just one leg?"     Both legs, left worse 3. SEVERITY: "How bad is the swelling?" (e.g., localized; mild, moderate, severe)  - Localized - small area of swelling localized to one leg  - MILD pedal edema - swelling limited to foot and ankle, pitting edema < 1/4 inch (6 mm) deep, rest and elevation eliminate most or all swelling  - MODERATE edema - swelling of lower leg to knee, pitting edema > 1/4 inch (6 mm) deep, rest and elevation only partially reduce swelling  - SEVERE edema - swelling extends above knee, facial or hand swelling present      Mild 4. REDNESS: "Does the swelling look red or infected?"     No 5. PAIN: "Is the swelling painful to touch?" If so, ask: "How painful is it?"   (Scale 1-10; mild, moderate or severe)     No 6. FEVER: "Do you have a fever?" If so, ask: "What is it, how was it measured, and when did it start?"      No 7. CAUSE: "What do you think is causing the leg swelling?"     Maybe her medicine 8. MEDICAL HISTORY: "Do you have a history of heart failure, kidney disease, liver failure, or cancer?"     Cancer - 20 years ago 84. RECURRENT SYMPTOM: "Have you had leg swelling before?" If so, ask: "When was the last time?" "What happened that time?"     No 10. OTHER SYMPTOMS: "Do you have any other symptoms?" (e.g., chest pain, difficulty breathing)       No 11. PREGNANCY: "Is there any chance you are pregnant?" "When was your last menstrual period?"       No  Protocols used: LEG  SWELLING AND EDEMA-A-AH

## 2019-01-05 NOTE — Patient Instructions (Signed)
The breast center will call to set up the mammogram.  If you do not hear from them within the next week call my office.

## 2019-01-05 NOTE — Progress Notes (Signed)
    [name] 1946/03/22 [id number]        73 y.o.  G5P5 presents complaining of fleeting right breast mastalgia.  Status post left mastectomy with reconstruction.  She notes over the last several months fleeting diffuse pain shooting through the breast.  No specific location.  No palpable abnormalities on self breast exam.  Mammogram a year ago was normal  Past medical history,surgical history, problem list, medications, allergies, family history and social history were all reviewed and documented in the EPIC chart.  Directed ROS with pertinent positives and negatives documented in the history of present illness/assessment and plan.  Exam: [name] assistant Vitals:   01/05/19 1458  BP: 124/82   General appearance:  Normal Examination lying and sitting shows right breast status post reduction without masses, retractions, discharge, adenopathy.  Left reconstruction without masses or axillary adenopathy.  Assessment/Plan:  73 y.o. G5P5 with fleeting right breast pain.  No specific location.  No abnormalities on exam.  We will start with diagnostic mammography.  Assuming negative then patient will continue with self breast exam and as long as no palpable abnormalities and her pain resolves will follow.  If pain persists regardless she will follow-up with me to consider further evaluation.    Anastasio Auerbach MD, 3:23 PM 01/05/2019

## 2019-01-06 ENCOUNTER — Telehealth: Payer: Self-pay | Admitting: *Deleted

## 2019-01-06 DIAGNOSIS — N644 Mastodynia: Secondary | ICD-10-CM

## 2019-01-06 NOTE — Telephone Encounter (Signed)
Patient scheduled at breast center on 01/13/19 @ 9:10am. Patient informed with time and date.

## 2019-01-06 NOTE — Telephone Encounter (Signed)
-----   Message from Anastasio Auerbach, MD sent at 01/05/2019  3:25 PM EDT ----- Schedule right breast diagnostic mammography reference status post left mastectomy with reconstruction.  Several months of right diffuse breast pain.  No abnormalities on provider exam

## 2019-01-10 DIAGNOSIS — N644 Mastodynia: Secondary | ICD-10-CM | POA: Insufficient documentation

## 2019-01-10 NOTE — Progress Notes (Signed)
Subjective:   Patient ID: [name], female    DOB: 1945/12/13, 73 y.o.   MRN: [id number]  [name] is a pleasant 73 y.o. year old pleasant  female with h/o of  Minor coronary artery disease by previous cardiac catheterization (20-30% stenosis in proximal-mid LAD, 2005), history of chest radiation therapy for left breast cancer (also mastectomy and chemotherapy), hyperlipidemia, hypertension, fibromyalgia.who presents to clinic today with Leg Swelling (Pt is here today C/O BLE Edema. Swelling has gotten bad for about 6 months. Today is a good day compared to other days.  She has had a cup of applesause. )  on 01/11/2019  HPI:  LE edema- patient called our triage nurse on 01/05/19 and stated the following:  "Pt. Reports she noticed swelling to both legs "about a year ago." Left leg is worse, and "it seems to be getting worse." No redness, swelling goes to the ankles. Denies any shortness of breath or chest pain."  She feels it has worsened over past 6 months.  Today is a good day day compared to other days.  Not using compression hose.  No CP or CP.  Has cardiologist- last saw Dr. Sallyanne Kuster on 11/22/16.  Note reviewed. She was seem for same complaint- pedal/LE edema.  She has long-standing mild ankle swelling, always a little worse in the right ankle compared with the left. The swelling is always worse after standing for prolonged periods of time and general resolves after lying in bed.  His was assessment was as follwws:  She had a normal stress test/nuclear perfusion study in April 2018 with normal perfusion. The EF was borderline at 52%. An echo performed in 2011 showed normal left ventricular size and systolic function, with a similar EF of 50-55 percent. There was no evidence of left ventricular hypertrophy and a left atrial size was normal. There were no serious valvular abnormalities. Although no specific comment was made about diastolic function, the measured parameters  appear to be within normal limits.  1. HTN: Excellent control 2. HLP: Unable to tolerate most statins. Stop the Zetia due to cost. Currently on very low dose pravastatin. Recheck labs. Does not have established coronary disease or peripheral vascular disease that has required revascularization, although she did have mild atherosclerosis of previous coronary angiography.  3. Venous insufficiency: Suspect this is the reason for ankle swelling. There is no evidence for heart failure or acute DVT. Recommend compression stockings and elevation of legs. 4. History of left breast radiation therapy for breast cancer, increasing the risk of coronary disease.   She is currently not taking anything for HLD-  She states she just cannot tolerate statins at all.  Lab Results  Component Value Date   CHOL 287 (H) 01/02/2017   HDL 77.70 01/02/2017   LDLCALC 176 (H) 01/02/2017   LDLDIRECT 189.0 02/21/2015   TRIG 165.0 (H) 01/02/2017   CHOLHDL 4 01/02/2017     The 10-year ASCVD risk score Mikey Bussing DC Jr., et al., 2013) is: 23.1%   Values used to calculate the score:     Age: 73 years     Sex: Female     Is Non-Hispanic African American: Yes     Diabetic: No     Tobacco smoker: No     Systolic Blood Pressure: 110 mmHg     Is BP treated: Yes     HDL Cholesterol: 77.7 mg/dL     Total Cholesterol: 287 mg/dL  Of note, she does have a right  breast diagnostic mammogram s/p left mastectomy with reconstruction on 9/23 due to several months of diffuse right breast pain.  Ordered by her Gyn, Dr. Phineas Real- who noted no abnormalities on exam.  HTN- has been well controlled on cozaar 100 mg daily. BP Readings from Last 3 Encounters:  01/11/19 138/76  01/05/19 124/82  07/14/18 (!) 141/85   Wt Readings from Last 3 Encounters:  01/11/19 208 lb 12.8 oz (94.7 kg)  07/21/18 200 lb (90.7 kg)  07/14/18 205 lb 9.6 oz (93.3 kg)   Lab Results  Component Value Date   TSH 3.71 01/02/2017   Lab Results  Component  Value Date   WBC 5.8 07/14/2018   HGB 13.0 07/14/2018   HCT 42.6 07/14/2018   MCV 82.4 07/14/2018   PLT 233 07/14/2018   No results found for: FERRITIN  Lab Results  Component Value Date   CREATININE 0.77 07/14/2018   Lab Results  Component Value Date   ALT 10 07/14/2018   AST 18 07/14/2018   ALKPHOS 118 07/14/2018   BILITOT 0.6 07/14/2018     Current Outpatient Medications on File Prior to Visit  Medication Sig Dispense Refill   acetaminophen (TYLENOL) 325 MG tablet Take 650 mg by mouth every 6 (six) hours as needed.     cetirizine (ZYRTEC) 10 MG tablet Take 10 mg by mouth daily.     losartan (COZAAR) 100 MG tablet TAKE 1 TABLET(100 MG) BY MOUTH DAILY 90 tablet 0   meclizine (ANTIVERT) 25 MG tablet TAKE 1 TABLET(25 MG) BY MOUTH THREE TIMES DAILY AS NEEDED FOR DIZZINESS 90 tablet 0   pantoprazole (PROTONIX) 40 MG tablet TAKE 1 TABLET(40 MG) BY MOUTH DAILY 30 tablet 5   potassium chloride SA (K-DUR) 20 MEQ tablet TAKE 1 TABLET(20 MEQ) BY MOUTH DAILY 30 tablet 0   Probiotic Product (ALIGN PO) Take 1 tablet by mouth daily.      No current facility-administered medications on file prior to visit.     Allergies  Allergen Reactions   Codeine Itching   Statins Other (See Comments)    Muscle aches    Past Medical History:  Diagnosis Date   Blood transfusion    /w childbirth    CAD (coronary artery disease)    Cancer ([place])    L breast - chemotherapy & radiation- 2000   Colon polyp    Diverticulitis    2009, while hosp for Diverticulitis, experienced pneumonia    Fibromyalgia    Gastritis    GERD (gastroesophageal reflux disease)    Hemorrhoids    Hyperlipidemia    Hypertension    sees [facility]- thinks last appt. 2012   Lymph edema    L arm, treatment 2013- PT until recent weeks    Personal history of radiation therapy 2000   Pneumonia    2009    Past Surgical History:  Procedure Laterality Date   ABDOMINAL ADHESION SURGERY     ABDOMINAL  HYSTERECTOMY     bleeding, leiomyomata   ANTERIOR AND POSTERIOR VAGINAL REPAIR  2010   APPENDECTOMY     BREAST RECONSTRUCTION     BREAST SURGERY     Mastectomy   CARDIAC CATHETERIZATION     yrs. ago- Dr. Terrence Dupont- [facility]   ingrown toenails      R foot & hammer toe surgery    INGUINAL HERNIA REPAIR  09/27/2011   Procedure: HERNIA REPAIR INGUINAL ADULT;  Surgeon: Gwenyth Ober, MD;  Location: [facility];  Service: General;  Laterality:  Right;   KNEE SURGERY     MASTECTOMY Left 2000   NASAL SINUS SURGERY     REDUCTION MAMMAPLASTY Right 2000   ROTATOR CUFF REPAIR     TONSILLECTOMY     as a child    Family History  Problem Relation Age of Onset   Alzheimer's disease Mother    Breast cancer Sister 58   Colon polyps Sister    Cancer Son        prostate   Anesthesia problems Neg Hx     Social History   Socioeconomic History   Marital status: Married    Spouse name: Not on file   Number of children: Not on file   Years of education: Not on file   Highest education level: Not on file  Occupational History   Not on file  Social Needs   Financial resource strain: Not on file   Food insecurity    Worry: Not on file    Inability: Not on file   Transportation needs    Medical: Not on file    Non-medical: Not on file  Tobacco Use   Smoking status: Former Smoker    Quit date: 09/20/1998    Years since quitting: 20.3   Smokeless tobacco: Never Used  Substance and Sexual Activity   Alcohol use: Yes    Alcohol/week: 0.0 standard drinks    Comment: Rare   Drug use: No   Sexual activity: Not Currently    Comment: 1st intercourse 73 yo-Fewer than 5 partners  Lifestyle   Physical activity    Days per week: Not on file    Minutes per session: Not on file   Stress: Not on file  Relationships   Social connections    Talks on phone: Not on file    Gets together: Not on file    Attends religious service: Not on file    Active member of club or  organization: Not on file    Attends meetings of clubs or organizations: Not on file    Relationship status: Not on file   Intimate partner violence    Fear of current or ex partner: Not on file    Emotionally abused: Not on file    Physically abused: Not on file    Forced sexual activity: Not on file  Other Topics Concern   Not on file  Social History Narrative   Widowed- husband died of Alzheimers in 22-Feb-2013.   The PMH, PSH, Social History, Family History, Medications, and allergies have been reviewed in [facility], and have been updated if relevant.' . Review of Systems  Constitutional: Negative.   HENT: Negative.   Eyes: Negative.   Respiratory: Negative.   Cardiovascular: Positive for leg swelling. Negative for chest pain and palpitations.  Gastrointestinal: Negative.   Endocrine: Negative.   Genitourinary: Negative.   Musculoskeletal: Negative.   Skin: Negative.   Allergic/Immunologic: Negative.   Neurological: Negative.   Hematological: Negative.   Psychiatric/Behavioral: Negative.   All other systems reviewed and are negative.      Objective:    BP 138/76 (BP Location: Right Arm, Patient Position: Sitting, Cuff Size: Normal)    Pulse 71    Temp 98.2 F (36.8 C) (Oral)    Wt 208 lb 12.8 oz (94.7 kg)    SpO2 100%    BMI 30.83 kg/m   Wt Readings from Last 3 Encounters:  01/11/19 208 lb 12.8 oz (94.7 kg)  07/21/18 200 lb (90.7 kg)  07/14/18 205 lb 9.6 oz (93.3 kg)     Physical Exam Vitals signs and nursing note reviewed.  Constitutional:      General: She is not in acute distress.    Appearance: She is obese. She is not ill-appearing or diaphoretic.  HENT:     Head: Normocephalic and atraumatic.     Right Ear: External ear normal.     Left Ear: External ear normal.     Mouth/Throat:     Mouth: Mucous membranes are moist.  Eyes:     Extraocular Movements: Extraocular movements intact.  Neck:     Musculoskeletal: Normal range of motion.  Cardiovascular:      Rate and Rhythm: Normal rate and regular rhythm.     Pulses: Normal pulses.  Pulmonary:     Effort: Pulmonary effort is normal.     Breath sounds: Normal breath sounds.  Musculoskeletal:     Right ankle: She exhibits swelling.     Left ankle: She exhibits swelling.     Right foot: Swelling present. No tenderness.     Left foot: Swelling present. No tenderness.     Comments: Bilateral 2+ NON pitting edema, no erythema, no tenderness to palpation, no weeping.  Skin:    General: Skin is warm and dry.  Neurological:     General: No focal deficit present.     Mental Status: She is alert and oriented to person, place, and time.  Psychiatric:        Attention and Perception: Attention normal.        Mood and Affect: Mood and affect normal.        Behavior: Behavior normal.        Thought Content: Thought content normal.        Cognition and Memory: Cognition normal.        Judgment: Judgment normal.           Assessment & Plan:   Edema, unspecified type - Plan: TSH, CBC with Differential/Platelet, B Nat Peptide  History of breast cancer in female  Breast pain, right  Essential hypertension  Pure hypercholesterolemia - Plan: Lipid Profile, Comprehensive metabolic panel  Coronary artery disease due to lipid rich plaque - Plan: Lipid Profile  Localized edema  - Plan: B Nat Peptide No follow-ups on file.

## 2019-01-11 ENCOUNTER — Other Ambulatory Visit: Payer: Self-pay

## 2019-01-11 ENCOUNTER — Ambulatory Visit (INDEPENDENT_AMBULATORY_CARE_PROVIDER_SITE_OTHER): Payer: Medicare Other | Admitting: Family Medicine

## 2019-01-11 ENCOUNTER — Other Ambulatory Visit (INDEPENDENT_AMBULATORY_CARE_PROVIDER_SITE_OTHER): Payer: Medicare Other

## 2019-01-11 ENCOUNTER — Encounter: Payer: Self-pay | Admitting: Family Medicine

## 2019-01-11 VITALS — BP 138/76 | HR 71 | Temp 98.2°F | Wt 208.8 lb

## 2019-01-11 DIAGNOSIS — N644 Mastodynia: Secondary | ICD-10-CM | POA: Diagnosis not present

## 2019-01-11 DIAGNOSIS — I10 Essential (primary) hypertension: Secondary | ICD-10-CM

## 2019-01-11 DIAGNOSIS — I2583 Coronary atherosclerosis due to lipid rich plaque: Secondary | ICD-10-CM

## 2019-01-11 DIAGNOSIS — I2510 Atherosclerotic heart disease of native coronary artery without angina pectoris: Secondary | ICD-10-CM

## 2019-01-11 DIAGNOSIS — R609 Edema, unspecified: Secondary | ICD-10-CM

## 2019-01-11 DIAGNOSIS — R600 Localized edema: Secondary | ICD-10-CM | POA: Diagnosis not present

## 2019-01-11 DIAGNOSIS — C50912 Malignant neoplasm of unspecified site of left female breast: Secondary | ICD-10-CM

## 2019-01-11 DIAGNOSIS — I872 Venous insufficiency (chronic) (peripheral): Secondary | ICD-10-CM | POA: Diagnosis not present

## 2019-01-11 DIAGNOSIS — I700 Atherosclerosis of aorta: Secondary | ICD-10-CM

## 2019-01-11 DIAGNOSIS — E7800 Pure hypercholesterolemia, unspecified: Secondary | ICD-10-CM

## 2019-01-11 DIAGNOSIS — Z853 Personal history of malignant neoplasm of breast: Secondary | ICD-10-CM | POA: Diagnosis not present

## 2019-01-11 LAB — COMPREHENSIVE METABOLIC PANEL
ALT: 6 U/L (ref 0–35)
AST: 14 U/L (ref 0–37)
Albumin: 4.4 g/dL (ref 3.5–5.2)
Alkaline Phosphatase: 96 U/L (ref 39–117)
BUN: 10 mg/dL (ref 6–23)
CO2: 26 mEq/L (ref 19–32)
Calcium: 10.6 mg/dL — ABNORMAL HIGH (ref 8.4–10.5)
Chloride: 108 mEq/L (ref 96–112)
Creatinine, Ser: 0.73 mg/dL (ref 0.40–1.20)
GFR: 94.38 mL/min (ref >60.00)
Glucose, Bld: 102 mg/dL — ABNORMAL HIGH (ref 70–99)
Potassium: 3.9 mEq/L (ref 3.5–5.1)
Sodium: 143 mEq/L (ref 135–145)
Total Bilirubin: 0.5 mg/dL (ref 0.2–1.2)
Total Protein: 6.9 g/dL (ref 6.0–8.3)

## 2019-01-11 LAB — CBC WITH DIFFERENTIAL/PLATELET
Basophils Absolute: 0 10*3/uL (ref 0.0–0.1)
Basophils Relative: 0.4 % (ref 0.0–3.0)
Eosinophils Absolute: 0.2 10*3/uL (ref 0.0–0.7)
Eosinophils Relative: 4.6 % (ref 0.0–5.0)
HCT: 39.3 % (ref 36.0–46.0)
Hemoglobin: 12.5 g/dL (ref 12.0–15.0)
Lymphocytes Relative: 38.2 % (ref 12.0–46.0)
Lymphs Abs: 1.7 10*3/uL (ref 0.7–4.0)
MCHC: 31.9 g/dL (ref 30.0–36.0)
MCV: 81.9 fl (ref 78.0–100.0)
Monocytes Absolute: 0.3 10*3/uL (ref 0.1–1.0)
Monocytes Relative: 6.1 % (ref 3.0–12.0)
Neutro Abs: 2.2 10*3/uL (ref 1.4–7.7)
Neutrophils Relative %: 50.7 % (ref 43.0–77.0)
Platelets: 202 10*3/uL (ref 150.0–400.0)
RBC: 4.8 Mil/uL (ref 3.87–5.11)
RDW: 14.4 % (ref 11.5–15.5)
WBC: 4.3 10*3/uL (ref 4.0–10.5)

## 2019-01-11 LAB — LIPID PANEL
Cholesterol: 304 mg/dL — ABNORMAL HIGH (ref 0–200)
HDL: 62.5 mg/dL (ref >39.00)
NonHDL: 241.22
Total CHOL/HDL Ratio: 5
Triglycerides: 238 mg/dL — ABNORMAL HIGH (ref 0.0–149.0)
VLDL: 47.6 mg/dL — ABNORMAL HIGH (ref 0.0–40.0)

## 2019-01-11 LAB — BRAIN NATRIURETIC PEPTIDE: Pro B Natriuretic peptide (BNP): 32 pg/mL (ref 0.0–100.0)

## 2019-01-11 LAB — TSH: TSH: 3.36 u[IU]/mL (ref 0.35–4.50)

## 2019-01-11 LAB — LDL CHOLESTEROL, DIRECT: Direct LDL: 209 mg/dL

## 2019-01-11 MED ORDER — ROSUVASTATIN CALCIUM 5 MG PO TABS: 5.0000 mg | ORAL_TABLET | Freq: Every day | ORAL | 3 refills | Status: DC

## 2019-01-11 NOTE — Assessment & Plan Note (Signed)
Well controlled 

## 2019-01-11 NOTE — Assessment & Plan Note (Addendum)
>  40 minutes spent in face to face time with patient, >50% spent in counselling or coordination of care discussing HLD, ascvd risk score, LE /venous insuffiency.  Explained to Ms. Leven that her ascvd risk score is quite high (she also has CAD) and even if she takes a statin a few times a week, it's preferable to her not taking one at all.  She agrees to try crestor 5 mg daily three times weekly.  I have scheduled her for follow up labs in 8 weeks.  The 10-year ASCVD risk score Mikey Bussing DC Brooke Bonito., et al., 2013) is: 23.1%   Values used to calculate the score:     Age: 73 years     Sex: Female     Is Non-Hispanic African American: Yes     Diabetic: No     Tobacco smoker: No     Systolic Blood Pressure: 677 mmHg     Is BP treated: Yes     HDL Cholesterol: 77.7 mg/dL     Total Cholesterol: 287 mg/dL

## 2019-01-11 NOTE — Patient Instructions (Addendum)
Great to see you. I will call you with your lab results from today and you can view them online.   We are starting Crestor 5 mg three times a week at bedtime.  We are going to have you come in for follow up labs on 11/16 at 10 am.  Get some light compression hose.  We have ordered venous ultrasounds for both of your legs- someone will call.   Chronic Venous Insufficiency Chronic venous insufficiency is a condition where the leg veins cannot effectively pump blood from the legs to the heart. This happens when the vein walls are either stretched, weakened, or damaged, or when the valves inside the vein are damaged. With the right treatment, you should be able to continue with an active life. This condition is also called venous stasis. What are the causes? Common causes of this condition include:  High blood pressure inside the veins (venous hypertension).  Sitting or standing too long, causing increased blood pressure in the leg veins.  A blood clot that blocks blood flow in a vein (deep vein thrombosis, DVT).  Inflammation of a vein (phlebitis) that causes a blood clot to form.  Tumors in the pelvis that cause blood to back up. What increases the risk? The following factors may make you more likely to develop this condition:  Having a family history of this condition.  Obesity.  Pregnancy.  Living without enough regular physical activity or exercise (sedentary lifestyle).  Smoking.  Having a job that requires long periods of standing or sitting in one place.  Being a certain age. Women in their 43s and 13s and men in their 58s are more likely to develop this condition. What are the signs or symptoms? Symptoms of this condition include:  Veins that are enlarged, bulging, or twisted (varicose veins).  Skin breakdown or ulcers.  Reddened skin or dark discoloration of skin on the leg between the knee and ankle.  Brown, smooth, tight, and painful skin just above the  ankle, usually on the inside of the leg (lipodermatosclerosis).  Swelling of the legs. How is this diagnosed? This condition may be diagnosed based on:  Your medical history.  A physical exam.  Tests, such as: ? A procedure that creates an image of a blood vessel and nearby organs and provides information about blood flow through the blood vessel (duplex ultrasound). ? A procedure that tests blood flow (plethysmography). ? A procedure that looks at the veins using X-ray and dye (venogram). How is this treated? The goals of treatment are to help you return to an active life and to minimize pain or disability. Treatment depends on the severity of your condition, and it may include:  Wearing compression stockings. These can help relieve symptoms and help prevent your condition from getting worse. However, they do not cure the condition.  Sclerotherapy. This procedure involves an injection of a solution that shrinks damaged veins.  Surgery. This may involve: ? Removing a diseased vein (vein stripping). ? Cutting off blood flow through the vein (laser ablation surgery). ? Repairing or reconstructing a valve within the affected vein. Follow these instructions at home:      Wear compression stockings as told by your health care provider. These stockings help to prevent blood clots and reduce swelling in your legs.  Take over-the-counter and prescription medicines only as told by your health care provider.  Stay active by exercising, walking, or doing different activities. Ask your health care provider what activities are safe for  you and how much exercise you need.  Drink enough fluid to keep your urine pale yellow.  Do not use any products that contain nicotine or tobacco, such as cigarettes, e-cigarettes, and chewing tobacco. If you need help quitting, ask your health care provider.  Keep all follow-up visits as told by your health care provider. This is important. Contact a  health care provider if you:  Have redness, swelling, or more pain in the affected area.  See a red streak or line that goes up or down from the affected area.  Have skin breakdown or skin loss in the affected area, even if the breakdown is small.  Get an injury in the affected area. Get help right away if:  You get an injury and an open wound in the affected area.  You have: ? Severe pain that does not get better with medicine. ? Sudden numbness or weakness in the foot or ankle below the affected area. ? Trouble moving your foot or ankle. ? A fever. ? Worse or persistent symptoms. ? Chest pain. ? Shortness of breath. Summary  Chronic venous insufficiency is a condition where the leg veins cannot effectively pump blood from the legs to the heart.  Chronic venous insufficiency occurs when the vein walls become stretched, weakened, or damaged, or when valves within the vein are damaged.  Treatment depends on how severe your condition is. It often involves wearing compression stockings and may involve having a procedure.  Make sure you stay active by exercising, walking, or doing different activities. Ask your health care provider what activities are safe for you and how much exercise you need. This information is not intended to replace advice given to you by your health care provider. Make sure you discuss any questions you have with your health care provider. Document Released: 08/12/2006 Document Revised: 12/30/2017 Document Reviewed: 12/30/2017 Elsevier Patient Education  2020 [employer].

## 2019-01-11 NOTE — Assessment & Plan Note (Signed)
Likely venous insuffiencey.  Dupplex doppler ordered (bilateral) ordered for further information.  See AVS for supportive care.  Orders Placed This Encounter  Procedures  . B Nat Peptide  . CBC with Differential/Platelet  . Lipid Profile  . TSH  . Lipid Profile  . Comprehensive metabolic panel  . VAS [place] LOWER EXTREMITY VENOUS (DVT)

## 2019-01-12 ENCOUNTER — Ambulatory Visit (HOSPITAL_COMMUNITY)
Admission: RE | Admit: 2019-01-12 | Discharge: 2019-01-12 | Disposition: A | Discharge status: Home / Self Care | Payer: Medicare Other | Source: Ambulatory Visit | Attending: Cardiovascular Disease | Admitting: Cardiovascular Disease

## 2019-01-12 ENCOUNTER — Other Ambulatory Visit: Payer: Self-pay

## 2019-01-12 DIAGNOSIS — I872 Venous insufficiency (chronic) (peripheral): Secondary | ICD-10-CM | POA: Insufficient documentation

## 2019-01-12 NOTE — Progress Notes (Signed)
pth with CA added for future order/called pt to advised to go to the [place] lab to have this drawn tomorrow/thx dmf

## 2019-01-13 ENCOUNTER — Ambulatory Visit: Payer: Medicare Other

## 2019-01-13 ENCOUNTER — Other Ambulatory Visit: Payer: Self-pay

## 2019-01-13 ENCOUNTER — Ambulatory Visit
Admission: RE | Admit: 2019-01-13 | Discharge: 2019-01-13 | Disposition: A | Discharge status: Home / Self Care | Payer: Medicare Other | Source: Ambulatory Visit | Attending: Gynecology | Admitting: Gynecology

## 2019-01-13 DIAGNOSIS — R928 Other abnormal and inconclusive findings on diagnostic imaging of breast: Secondary | ICD-10-CM | POA: Diagnosis not present

## 2019-01-13 DIAGNOSIS — N644 Mastodynia: Secondary | ICD-10-CM

## 2019-01-22 ENCOUNTER — Other Ambulatory Visit: Payer: Self-pay | Admitting: Family Medicine

## 2019-01-27 ENCOUNTER — Encounter: Payer: Self-pay | Admitting: Gynecology

## 2019-02-10 ENCOUNTER — Other Ambulatory Visit: Payer: Self-pay | Admitting: Family Medicine

## 2019-02-11 NOTE — Telephone Encounter (Signed)
Last fill Pantoprazole 40mg  07/21/18  #30/5 Last fill Potassium 12/18/18 #30/0 Last OV 01/11/19

## 2019-03-05 ENCOUNTER — Telehealth: Payer: Self-pay

## 2019-03-05 NOTE — Telephone Encounter (Signed)

## 2019-03-08 ENCOUNTER — Other Ambulatory Visit: Payer: Self-pay

## 2019-03-08 ENCOUNTER — Other Ambulatory Visit (INDEPENDENT_AMBULATORY_CARE_PROVIDER_SITE_OTHER): Payer: Medicare Other

## 2019-03-09 LAB — PTH, INTACT AND CALCIUM
Calcium: 7.9 mg/dL — ABNORMAL LOW (ref 8.6–10.4)
PTH: 9 pg/mL — ABNORMAL LOW (ref 14–64)

## 2019-04-20 ENCOUNTER — Other Ambulatory Visit: Payer: Self-pay

## 2019-04-21 ENCOUNTER — Ambulatory Visit (INDEPENDENT_AMBULATORY_CARE_PROVIDER_SITE_OTHER): Payer: Medicare Other | Admitting: Gynecology

## 2019-04-21 ENCOUNTER — Encounter: Payer: Self-pay | Admitting: Gynecology

## 2019-04-21 VITALS — BP 140/90 | Ht 68.0 in | Wt 214.0 lb

## 2019-04-21 DIAGNOSIS — Z01419 Encounter for gynecological examination (general) (routine) without abnormal findings: Secondary | ICD-10-CM | POA: Diagnosis not present

## 2019-04-21 DIAGNOSIS — Z853 Personal history of malignant neoplasm of breast: Secondary | ICD-10-CM | POA: Diagnosis not present

## 2019-04-21 DIAGNOSIS — Z9289 Personal history of other medical treatment: Secondary | ICD-10-CM | POA: Diagnosis not present

## 2019-04-21 DIAGNOSIS — N952 Postmenopausal atrophic vaginitis: Secondary | ICD-10-CM | POA: Diagnosis not present

## 2019-04-21 DIAGNOSIS — R3915 Urgency of urination: Secondary | ICD-10-CM

## 2019-04-21 DIAGNOSIS — N644 Mastodynia: Secondary | ICD-10-CM | POA: Diagnosis not present

## 2019-04-21 NOTE — Patient Instructions (Signed)
Follow-up in 1 year for annual exam, sooner as needed. 

## 2019-04-21 NOTE — Progress Notes (Signed)
    [place] 1945/12/16 [id number]        73 y.o.  G5P5 for breast and pelvic exam.  Does note starting earlier this week frequency and urgency.  No fever chills dysuria or vaginal symptoms such as discharge or odor.  Past medical history,surgical history, problem list, medications, allergies, family history and social history were all reviewed and documented as reviewed in the EPIC chart.  ROS:  Performed with pertinent positives and negatives included in the history, assessment and plan.   Additional significant findings : None   Exam: Journalist, newspaper Vitals:   04/21/19 1006  BP: 140/90  Weight: 214 lb (97.1 kg)  Height: 5\' 8"  (1.727 m)   Body mass index is 32.54 kg/m.  General appearance:  Normal affect, orientation and appearance. Skin: Grossly normal HEENT: Without gross lesions.  No cervical or supraclavicular adenopathy. Thyroid normal.  Lungs:  Clear without wheezing, rales or rhonchi Cardiac: RR, without RMG Abdominal:  Soft, nontender, without masses, guarding, rebound, organomegaly or hernia Breasts:  Examined lying and sitting.  Right without masses, retractions, discharge or axillary adenopathy.  Left status post mastectomy with reconstruction.  No masses or axillary adenopathy Pelvic:  Ext, BUS, Vagina: With atrophic changes  Adnexa: Without masses or tenderness    Anus and perineum: Normal   Rectovaginal: Normal sphincter tone without palpated masses or tenderness.    Assessment/Plan:  73 y.o. G35P5 female for breast and pelvic exam.  Status post TAH anterior/posterior repair for leiomyoma/menorrhagia in the past  1. Postmenopausal.  Without significant menopausal symptoms. 2. New onset urinary frequency and urgency.  Urinalysis dipstick is negative.  Microscopic shows few bacteria but 6-10 squamous cells to suggest contamination.  Will culture and follow-up if positive with treatment.  If culture negative then the patient will monitor her symptoms, push  fluids and will represent if her symptoms would persist. 3. History of left breast cancer status postmastectomy with reconstruction.  Exam NED.  Recent mammography 12/2018.  Having some fleeting right breast pain that comes and goes.  Will monitor for now.  If persists will represent for further evaluation. 4. Pap smear 2019.  No Pap smear done today.  No history of significant abnormal Pap smears.  Options to stop screening per current screening guidelines reviewed.  Will readdress on an annual basis. 5. DEXA 2020 normal.  Plan repeat DEXA at 5-year interval. 6. Colonoscopy 2019.  Repeat at their recommended interval. 7. Health maintenance.  Blood pressure 140/90 noted to the patient.  Will recheck in a nonexam situation.  Follow-up with primary provider if remains elevated.  No routine lab work done as patient does this elsewhere.  Follow-up 1 year, sooner as needed.   Anastasio Auerbach MD, 10:33 AM 04/21/2019

## 2019-04-23 DIAGNOSIS — C679 Malignant neoplasm of bladder, unspecified: Secondary | ICD-10-CM

## 2019-04-23 HISTORY — DX: Malignant neoplasm of bladder, unspecified: C67.9

## 2019-04-23 LAB — URINE CULTURE
MICRO NUMBER:: 1240998
Result:: NO GROWTH
SPECIMEN QUALITY:: ADEQUATE

## 2019-04-23 LAB — URINALYSIS, COMPLETE W/RFL CULTURE
Bilirubin Urine: NEGATIVE
Glucose, UA: NEGATIVE
Hgb urine dipstick: NEGATIVE
Hyaline Cast: NONE SEEN /LPF
Ketones, ur: NEGATIVE
Leukocyte Esterase: NEGATIVE
Nitrites, Initial: NEGATIVE
Protein, ur: NEGATIVE
RBC / HPF: NONE SEEN /HPF (ref 0–2)
Specific Gravity, Urine: 1.025 (ref 1.001–1.03)
pH: 7 (ref 5.0–8.0)

## 2019-04-23 LAB — CULTURE INDICATED

## 2019-04-29 ENCOUNTER — Other Ambulatory Visit: Payer: Self-pay

## 2019-04-29 MED ORDER — LOSARTAN POTASSIUM 100 MG PO TABS: ORAL_TABLET | ORAL | 0 refills | Status: DC

## 2019-04-29 NOTE — Telephone Encounter (Signed)
Last OV 01/11/19 Last fill 01/22/19  #90/0

## 2019-05-18 ENCOUNTER — Ambulatory Visit: Payer: Medicare Other

## 2019-05-27 ENCOUNTER — Ambulatory Visit: Payer: Medicare Other

## 2019-05-29 ENCOUNTER — Ambulatory Visit: Payer: Medicare Other

## 2019-06-07 ENCOUNTER — Other Ambulatory Visit: Payer: Self-pay

## 2019-06-09 ENCOUNTER — Ambulatory Visit: Payer: Medicare Other | Admitting: Obstetrics and Gynecology

## 2019-07-21 ENCOUNTER — Other Ambulatory Visit: Payer: Self-pay

## 2019-07-22 ENCOUNTER — Encounter: Payer: Self-pay | Admitting: Family Medicine

## 2019-07-22 ENCOUNTER — Ambulatory Visit (INDEPENDENT_AMBULATORY_CARE_PROVIDER_SITE_OTHER): Payer: Medicare Other | Admitting: Family Medicine

## 2019-07-22 VITALS — BP 126/74 | HR 73 | Temp 97.1°F | Ht 68.0 in | Wt 213.2 lb

## 2019-07-22 DIAGNOSIS — L720 Epidermal cyst: Secondary | ICD-10-CM | POA: Diagnosis not present

## 2019-07-22 DIAGNOSIS — K5901 Slow transit constipation: Secondary | ICD-10-CM

## 2019-07-22 DIAGNOSIS — D1611 Benign neoplasm of short bones of right upper limb: Secondary | ICD-10-CM | POA: Diagnosis not present

## 2019-07-22 MED ORDER — DOXYCYCLINE HYCLATE 100 MG PO TABS: 100.0000 mg | ORAL_TABLET | Freq: Two times a day (BID) | ORAL | 0 refills | Status: DC

## 2019-07-22 NOTE — Progress Notes (Signed)
Established Patient Office Visit  Subjective:  Patient ID: [name], female    DOB: 1945/05/18  Age: 74 y.o. MRN: [id number]  CC:  Chief Complaint  Patient presents with  . Transitions Of Care    c/o pimples at right armpit little tender to touch, pain in right side of neck, swelling in left leg     HPI [name] presents for evaluation treatment of a cyst in her right axilla, right thumb and constipation.  Cyst has been present in her thumb for some time now.  It is often tender.  She does use her hands often.  She has a cyst in her right axilla.  It has not drained.  She is having problems with constipation.  She has tried milk of magnesia tablets with limited success.  Past Medical History:  Diagnosis Date  . Blood transfusion    /w childbirth   . CAD (coronary artery disease)   . Cancer ([place])    L breast - chemotherapy & radiation- 2000  . Colon polyp   . Diverticulitis    2009, while hosp for Diverticulitis, experienced pneumonia   . Fibromyalgia   . Gastritis   . GERD (gastroesophageal reflux disease)   . Hemorrhoids   . Hyperlipidemia   . Hypertension    sees [facility]- thinks last appt. 2012  . Lymph edema    L arm, treatment 2013- PT until recent weeks   . Personal history of radiation therapy 2000  . Pneumonia    2009    Past Surgical History:  Procedure Laterality Date  . ABDOMINAL ADHESION SURGERY    . ABDOMINAL HYSTERECTOMY     bleeding, leiomyomata  . ANTERIOR AND POSTERIOR VAGINAL REPAIR  2010  . APPENDECTOMY    . BREAST RECONSTRUCTION    . BREAST SURGERY     Mastectomy  . CARDIAC CATHETERIZATION     yrs. ago- Dr. Terrence Dupont- [facility]  . ingrown toenails      R foot & hammer toe surgery   . INGUINAL HERNIA REPAIR  09/27/2011   Procedure: HERNIA REPAIR INGUINAL ADULT;  Surgeon: Gwenyth Ober, MD;  Location: [place];  Service: General;  Laterality: Right;  . KNEE SURGERY    . MASTECTOMY Left 2000  . NASAL SINUS SURGERY    . REDUCTION  MAMMAPLASTY Right 2000  . ROTATOR CUFF REPAIR    . TONSILLECTOMY     as a child    Family History  Problem Relation Age of Onset  . Alzheimer's disease Mother   . Breast cancer Sister 62  . Colon polyps Sister   . Cancer Son        prostate  . Anesthesia problems Neg Hx     Social History   Socioeconomic History  . Marital status: Married    Spouse name: Not on file  . Number of children: Not on file  . Years of education: Not on file  . Highest education level: Not on file  Occupational History  . Not on file  Tobacco Use  . Smoking status: Former Smoker    Quit date: 09/20/1998    Years since quitting: 20.8  . Smokeless tobacco: Never Used  Substance and Sexual Activity  . Alcohol use: Yes    Alcohol/week: 0.0 standard drinks    Comment: Rare  . Drug use: No  . Sexual activity: Not Currently    Comment: 1st intercourse 74 yo-Fewer than 5 partners  Other Topics Concern  .  Not on file  Social History Narrative   Widowed- husband died of Alzheimers in 2013-02-16.   Social Determinants of Health   Financial Resource Strain:   . Difficulty of Paying Living Expenses:   Food Insecurity:   . Worried About Charity fundraiser in the Last Year:   . Arboriculturist in the Last Year:   Transportation Needs:   . Film/video editor (Medical):   [name] Lack of Transportation (Non-Medical):   Physical Activity:   . Days of Exercise per Week:   . Minutes of Exercise per Session:   Stress:   . Feeling of Stress :   Social Connections:   . Frequency of Communication with Friends and Family:   . Frequency of Social Gatherings with Friends and Family:   . Attends Religious Services:   . Active Member of Clubs or Organizations:   . Attends Archivist Meetings:   [name] Marital Status:   Intimate Partner Violence:   . Fear of Current or Ex-Partner:   . Emotionally Abused:   [name] Physically Abused:   . Sexually Abused:     Outpatient Medications Prior to Visit    Medication Sig Dispense Refill  . acetaminophen (TYLENOL) 325 MG tablet Take 650 mg by mouth every 6 (six) hours as needed.    . cetirizine (ZYRTEC) 10 MG tablet Take 10 mg by mouth daily.    [name] losartan (COZAAR) 100 MG tablet TAKE 1 TABLET(100 MG) BY MOUTH DAILY 90 tablet 0  . meclizine (ANTIVERT) 25 MG tablet TAKE 1 TABLET(25 MG) BY MOUTH THREE TIMES DAILY AS NEEDED FOR DIZZINESS 90 tablet 0  . pantoprazole (PROTONIX) 40 MG tablet TAKE 1 TABLET(40 MG) BY MOUTH DAILY 30 tablet 5  . potassium chloride SA (KLOR-CON) 20 MEQ tablet TAKE 1 TABLET(20 MEQ) BY MOUTH DAILY 30 tablet 0  . Probiotic Product (ALIGN PO) Take 1 tablet by mouth daily.     . rosuvastatin (CRESTOR) 5 MG tablet Take 1 tablet (5 mg total) by mouth daily. (Patient not taking: Reported on 07/22/2019) 90 tablet 3   No facility-administered medications prior to visit.    Allergies  Allergen Reactions  . Codeine Itching  . Statins Other (See Comments)    Muscle aches    ROS Review of Systems  Constitutional: Negative.   HENT: Negative.   Eyes: Negative for photophobia and visual disturbance.  Respiratory: Negative.   Cardiovascular: Negative.   Gastrointestinal: Positive for constipation. Negative for abdominal pain, anal bleeding and blood in stool.  Musculoskeletal: Positive for arthralgias.  Skin: Negative for pallor and wound.  Neurological: Negative for light-headedness and headaches.  Psychiatric/Behavioral: Negative.       Objective:    Physical Exam  Constitutional: She is oriented to person, place, and time. She appears well-developed and well-nourished. No distress.  HENT:  Head: Normocephalic and atraumatic.  Right Ear: External ear normal.  Left Ear: External ear normal.  Eyes: Conjunctivae are normal. Right eye exhibits no discharge. Left eye exhibits no discharge. No scleral icterus.  Neck: No JVD present. No tracheal deviation present.  Pulmonary/Chest: Effort normal. No stridor.   Musculoskeletal:       Hands:  Neurological: She is alert and oriented to person, place, and time.  Skin: Skin is warm and dry. She is not diaphoretic.     Psychiatric: She has a normal mood and affect. Her behavior is normal.    BP 126/74   Pulse 73   Temp (!)  97.1 F (36.2 C) (Tympanic)   Ht 5\' 8"  (1.727 m)   Wt 213 lb 3.2 oz (96.7 kg)   SpO2 99%   BMI 32.42 kg/m  Wt Readings from Last 3 Encounters:  07/22/19 213 lb 3.2 oz (96.7 kg)  04/21/19 214 lb (97.1 kg)  01/11/19 208 lb 12.8 oz (94.7 kg)     There are no preventive care reminders to display for this patient.  There are no preventive care reminders to display for this patient.  Lab Results  Component Value Date   TSH 3.36 01/11/2019   Lab Results  Component Value Date   WBC 4.3 01/11/2019   HGB 12.5 01/11/2019   HCT 39.3 01/11/2019   MCV 81.9 01/11/2019   PLT 202.0 01/11/2019   Lab Results  Component Value Date   NA 143 01/11/2019   K 3.9 01/11/2019   CO2 26 01/11/2019   GLUCOSE 102 (H) 01/11/2019   BUN 10 01/11/2019   CREATININE 0.73 01/11/2019   BILITOT 0.5 01/11/2019   ALKPHOS 96 01/11/2019   AST 14 01/11/2019   ALT 6 01/11/2019   PROT 6.9 01/11/2019   ALBUMIN 4.4 01/11/2019   CALCIUM 7.9 (L) 03/08/2019   ANIONGAP 10 07/14/2018   GFR 94.38 01/11/2019   Lab Results  Component Value Date   CHOL 304 (H) 01/11/2019   Lab Results  Component Value Date   HDL 62.50 01/11/2019   Lab Results  Component Value Date   LDLCALC 176 (H) 01/02/2017   Lab Results  Component Value Date   TRIG 238.0 (H) 01/11/2019   Lab Results  Component Value Date   CHOLHDL 5 01/11/2019   No results found for: HGBA1C    Assessment & Plan:   Problem List Items Addressed This Visit      Digestive   Slow transit constipation     Musculoskeletal and Integument   Enchondroma of finger of right hand - Primary   Relevant Orders   Ambulatory referral to Hand Surgery     Other   Inclusion cyst    Relevant Medications   doxycycline (VIBRA-TABS) 100 MG tablet      Meds ordered this encounter  Medications  . doxycycline (VIBRA-TABS) 100 MG tablet    Sig: Take 1 tablet (100 mg total) by mouth 2 (two) times daily.    Dispense:  20 tablet    Refill:  0    Follow-up: Return if symptoms worsen or fail to improve.   Suggested that the patient try soluble fiber laxative for her constipation.  Agrees to referral with hand surgeon for [place].  We will treat axilla cyst with Doxy and then follow-up for possible I&D.  She was given information on constipation and epidermal cyst. Libby Maw, MD

## 2019-07-22 NOTE — Patient Instructions (Addendum)
Epidermal Cyst  An epidermal cyst is a small, painless lump under your skin. The cyst contains a grayish-white, bad-smelling substance (keratin). Do not try to pop or open an epidermal cyst yourself. What are the causes?  A blocked hair follicle.  A hair that curls and re-enters the skin instead of growing straight out of the skin.  A blocked pore.  Irritated skin.  An injury to the skin.  Certain conditions that are passed along from parent to child (inherited).  Human papillomavirus (HPV).  Long-term sun damage to the skin. What increases the risk?  Having acne.  Being overweight.  Being 38-17 years old. What are the signs or symptoms? These cysts are usually harmless, but they can get infected. Symptoms of infection may include:  Redness.  Inflammation.  Tenderness.  Warmth.  Fever.  A grayish-white, bad-smelling substance drains from the cyst.  Pus drains from the cyst. How is this treated? In many cases, epidermal cysts go away on their own without treatment. If a cyst becomes infected, treatment may include:  Opening and draining the cyst, done by a doctor. After draining, you may need minor surgery to remove the rest of the cyst.  Antibiotic medicine.  Shots of medicines (steroids) that help to reduce inflammation.  Surgery to remove the cyst. Surgery may be done if the cyst: ? Becomes large. ? Bothers you. ? Has a chance of turning into cancer.  Do not try to open a cyst yourself. Follow these instructions at home:  Take over-the-counter and prescription medicines only as told by your doctor.  If you were prescribed an antibiotic medicine, take it it as told by your doctor. Do not stop using the antibiotic even if you start to feel better.  Keep the area around your cyst clean and dry.  Wear loose, dry clothing.  Avoid touching your cyst.  Check your cyst every day for signs of infection. Check for: ? Redness, swelling, or pain. ? Fluid  or blood. ? Warmth. ? Pus or a bad smell.  Keep all follow-up visits as told by your doctor. This is important. How is this prevented?  Wear clean, dry, clothing.  Avoid wearing tight clothing.  Keep your skin clean and dry. Take showers or baths every day. Contact a doctor if:  Your cyst has symptoms of infection.  Your condition does not improve or gets worse.  You have a cyst that looks different from other cysts you have had.  You have a fever. Get help right away if:  Redness spreads from the cyst into the area close by. Summary  An epidermal cyst is a sac made of skin tissue.  If a cyst becomes infected, treatment may include surgery to open and drain the cyst, or to remove it.  Take over-the-counter and prescription medicines only as told by your doctor.  Contact a doctor if your condition is not improving or is getting worse.  Keep all follow-up visits as told by your doctor. This is important. This information is not intended to replace advice given to you by your health care provider. Make sure you discuss any questions you have with your health care provider. Document Revised: 07/30/2018 Document Reviewed: 01/15/2018 Elsevier Patient Education  [place].  Constipation, Adult Constipation is when a person:  Poops (has a bowel movement) fewer times in a week than normal.  Has a hard time pooping.  Has poop that is dry, hard, or bigger than normal. Follow these instructions at home:  Eating and drinking   Eat foods that have a lot of fiber, such as: ? Fresh fruits and vegetables. ? Whole grains. ? Beans.  Eat less of foods that are high in fat, low in fiber, or overly processed, such as: ? [place] fries. ? Hamburgers. ? Cookies. ? Candy. ? Soda.  Drink enough fluid to keep your pee (urine) clear or pale yellow. General instructions  Exercise regularly or as told by your doctor.  Go to the restroom when you feel like you need to poop.  Do not hold it in.  Take over-the-counter and prescription medicines only as told by your doctor. These include any fiber supplements.  Do pelvic floor retraining exercises, such as: ? Doing deep breathing while relaxing your lower belly (abdomen). ? Relaxing your pelvic floor while pooping.  Watch your condition for any changes.  Keep all follow-up visits as told by your doctor. This is important. Contact a doctor if:  You have pain that gets worse.  You have a fever.  You have not pooped for 4 days.  You throw up (vomit).  You are not hungry.  You lose weight.  You are bleeding from the anus.  You have thin, pencil-like poop (stool). Get help right away if:  You have a fever, and your symptoms suddenly get worse.  You leak poop or have blood in your poop.  Your belly feels hard or bigger than normal (is bloated).  You have very bad belly pain.  You feel dizzy or you faint. This information is not intended to replace advice given to you by your health care provider. Make sure you discuss any questions you have with your health care provider. Document Revised: 03/21/2017 Document Reviewed: 09/27/2015 Elsevier Patient Education  2020 [employer].

## 2019-07-28 ENCOUNTER — Ambulatory Visit: Payer: Medicare Other | Admitting: Family Medicine

## 2019-07-30 ENCOUNTER — Other Ambulatory Visit: Payer: Self-pay

## 2019-07-30 MED ORDER — LOSARTAN POTASSIUM 100 MG PO TABS: ORAL_TABLET | ORAL | 1 refills | Status: DC

## 2019-08-02 NOTE — Progress Notes (Signed)
Cardiology Office Note:    Date:  08/03/2019   ID:  [name], DOB 10-24-1945, MRN [id number]  PCP:  Libby Maw, MD  Cardiologist:  Sanda Klein, MD   Referring MD: Libby Maw,*   Chief Complaint  Patient presents with  . Follow-up  atypical chest pain  History of Present Illness:    [name] is a 74 y.o. female with a hx of nonobstructive CAD by heart cath in 2005 (20-30% stenosis proximal-mid LAD - heart cath in response to abnormal stress test), hx of breast cancer s/p chest radiation, mastectomy and chemotherapy, HLD, HTN, and fibromyalgia. She has chronic lower extremity swelling secondary to venous insufficiency.  Myoview 07/31/2016 was low risk with a low-normal EF, consistent with echo 2011 with EF 50-55%. She has been seen previously by Dr. Terrence Dupont, Dr. Debara Pickett, and most recently Dr. Sallyanne Kuster - last seen 11/22/16.  She presents today for routine follow up. She complains of chest pain that radiates to her back for the past month. She thought it was indigestion, that sometimes improves with baking soda and water. CP occurs when she turns he upper body or turns her head in a certain way. She is also tender to palpation, CP worse with deep inspiration. She stopped taking the HCTZ portion of her BP medication because she thought there was an increase in cancer risk. Unclear if this is in relation to the [employer] recalls several months ago. Since then, she also stopped taking her potassium supplement because she read that she should not take that with losartan.  She has had an increase in leg cramps and stopped taking crestor. Also, with leg edema since stopping HCTZ. No SOB, DOE, palpitations, or near-/syncope.   Past Medical History:  Diagnosis Date  . Blood transfusion    /w childbirth   . CAD (coronary artery disease)   . Cancer ([place])    L breast - chemotherapy & radiation- 2000  . Colon polyp   . Diverticulitis    2009, while hosp for  Diverticulitis, experienced pneumonia   . Fibromyalgia   . Gastritis   . GERD (gastroesophageal reflux disease)   . Hemorrhoids   . Hyperlipidemia   . Hypertension    sees [facility]- thinks last appt. 2012  . Lymph edema    L arm, treatment 2013- PT until recent weeks   . Personal history of radiation therapy 2000  . Pneumonia    2009    Past Surgical History:  Procedure Laterality Date  . ABDOMINAL ADHESION SURGERY    . ABDOMINAL HYSTERECTOMY     bleeding, leiomyomata  . ANTERIOR AND POSTERIOR VAGINAL REPAIR  2010  . APPENDECTOMY    . BREAST RECONSTRUCTION    . BREAST SURGERY     Mastectomy  . CARDIAC CATHETERIZATION     yrs. ago- Dr. Terrence Dupont- [facility]  . ingrown toenails      R foot & hammer toe surgery   . INGUINAL HERNIA REPAIR  09/27/2011   Procedure: HERNIA REPAIR INGUINAL ADULT;  Surgeon: Gwenyth Ober, MD;  Location: [place];  Service: General;  Laterality: Right;  . KNEE SURGERY    . MASTECTOMY Left 2000  . NASAL SINUS SURGERY    . REDUCTION MAMMAPLASTY Right 2000  . ROTATOR CUFF REPAIR    . TONSILLECTOMY     as a child    Current Medications: Current Meds  Medication Sig  . acetaminophen (TYLENOL) 325 MG tablet Take 650 mg by mouth every  6 (six) hours as needed.  . cetirizine (ZYRTEC) 10 MG tablet Take 10 mg by mouth daily.  [name] doxycycline (VIBRA-TABS) 100 MG tablet Take 1 tablet (100 mg total) by mouth 2 (two) times daily.  . meclizine (ANTIVERT) 25 MG tablet TAKE 1 TABLET(25 MG) BY MOUTH THREE TIMES DAILY AS NEEDED FOR DIZZINESS  . Probiotic Product (ALIGN PO) Take 1 tablet by mouth daily.   . [DISCONTINUED] losartan (COZAAR) 100 MG tablet TAKE 1 TABLET(100 MG) BY MOUTH DAILY  . [DISCONTINUED] pantoprazole (PROTONIX) 40 MG tablet TAKE 1 TABLET(40 MG) BY MOUTH DAILY  . [DISCONTINUED] potassium chloride SA (KLOR-CON) 20 MEQ tablet TAKE 1 TABLET(20 MEQ) BY MOUTH DAILY  . [DISCONTINUED] rosuvastatin (CRESTOR) 5 MG tablet Take 1 tablet (5 mg total) by mouth daily.      Allergies:   Codeine and Statins   Social History   Socioeconomic History  . Marital status: Married    Spouse name: Not on file  . Number of children: Not on file  . Years of education: Not on file  . Highest education level: Not on file  Occupational History  . Not on file  Tobacco Use  . Smoking status: Former Smoker    Quit date: 09/20/1998    Years since quitting: 20.8  . Smokeless tobacco: Never Used  Substance and Sexual Activity  . Alcohol use: Yes    Alcohol/week: 0.0 standard drinks    Comment: Rare  . Drug use: No  . Sexual activity: Not Currently    Comment: 1st intercourse 74 yo-Fewer than 5 partners  Other Topics Concern  . Not on file  Social History Narrative   Widowed- husband died of Alzheimers in Feb 10, 2013.   Social Determinants of Health   Financial Resource Strain:   . Difficulty of Paying Living Expenses:   Food Insecurity:   . Worried About Charity fundraiser in the Last Year:   . Arboriculturist in the Last Year:   Transportation Needs:   . Film/video editor (Medical):   [name] Lack of Transportation (Non-Medical):   Physical Activity:   . Days of Exercise per Week:   . Minutes of Exercise per Session:   Stress:   . Feeling of Stress :   Social Connections:   . Frequency of Communication with Friends and Family:   . Frequency of Social Gatherings with Friends and Family:   . Attends Religious Services:   . Active Member of Clubs or Organizations:   . Attends Archivist Meetings:   [name] Marital Status:      Family History: The patient's family history includes Alzheimer's disease in her mother; Breast cancer (age of onset: 53) in her sister; Cancer in her son; Colon polyps in her sister. There is no history of Anesthesia problems.  ROS:   Please see the history of present illness.     All other systems reviewed and are negative.  EKGs/Labs/Other Studies Reviewed:    The following studies were reviewed today:  Myoview  stress test  The left ventricular ejection fraction is mildly decreased (45-54%).  Nuclear stress EF: 52%.  Blood pressure demonstrated a normal response to exercise.  There was no ST segment deviation noted during stress.  No T wave inversion was noted during stress.  The study is normal.  This is a low risk study.   EKG:  EKG is  ordered today.  The ekg ordered today demonstrates sinus rhythm HR 72  Recent Labs: 01/11/2019:  ALT 6; BUN 10; Creatinine, Ser 0.73; Hemoglobin 12.5; Platelets 202.0; Potassium 3.9; Pro B Natriuretic peptide (BNP) 32.0; Sodium 143; TSH 3.36  Recent Lipid Panel    Component Value Date/Time   CHOL 304 (H) 01/11/2019 1544   TRIG 238.0 (H) 01/11/2019 1544   HDL 62.50 01/11/2019 1544   CHOLHDL 5 01/11/2019 1544   VLDL 47.6 (H) 01/11/2019 1544   LDLCALC 176 (H) 01/02/2017 0926   LDLDIRECT 209.0 01/11/2019 1544    Physical Exam:    VS:  BP 138/90   Pulse 80   Temp (!) 93.4 F (34.1 C)   Ht 5\' 7"  (1.702 m)   Wt 212 lb 12.8 oz (96.5 kg)   SpO2 98%   BMI 33.33 kg/m     Wt Readings from Last 3 Encounters:  08/03/19 212 lb 12.8 oz (96.5 kg)  07/22/19 213 lb 3.2 oz (96.7 kg)  04/21/19 214 lb (97.1 kg)     GEN: elderly female in no acute distress HEENT: Normal NECK: No JVD; No carotid bruits LYMPHATICS: No lymphadenopathy CARDIAC: RRR, no murmurs, rubs, gallops, tender to palpation on her left anterior chest and left upper back RESPIRATORY:  Clear to auscultation without rales, wheezing or rhonchi  ABDOMEN: Soft, non-tender, non-distended MUSCULOSKELETAL:  B LE non-pitting edema; No deformity, + pedal pulses SKIN: Warm and dry NEUROLOGIC:  Alert and oriented x 3 PSYCHIATRIC:  Normal affect   ASSESSMENT:    1. Atypical chest pain   2. Coronary artery disease due to lipid rich plaque   3. Essential hypertension   4. Hypokalemia   5. Pure hypercholesterolemia    PLAN:    In order of problems listed above:  Atypical chest pain - she  has chest pain that is tender to palpation on her anterior chest above her left breast and on her left upper back that is tender to very light touch (no rash, no numbness or tingling, she denies hx of shingles) - CP hurts worse with movement and deep inspiration - question possible scar tissue following chest radiation - we reviewed some stretches she can try, she will also touch base with her PCP about this - do not suspect an ACS process - EKG nonischemic today   Nonobstructive CAD - continue ASA - EKG appears nonischemic today - stress test in 2018 reassuring - do not suspect that her chest pain is angina   Hypertension Hx of hypokalemia - has been taking 100 mg losartan - she was taken off of the HCTZ component due to carcinogenic scares  - she has had lower extremity edema since stopping HCTZ - BP is borderline today - she states it was perfect when she was on HCTZ  - will restart her prior 100-25 losartan-HCTZ with 20 mEq potassium - recheck BMP in 2 weeks - she also complains of leg cramping - will check a Mg as well   Hyperlipidemia 01/11/2019: Cholesterol 304; HDL 62.50; Triglycerides 238.0; VLDL 47.6 Direct LDL 209 - did not take tolerate crestor, stopped taking it - has not tolerated pravastatin in the past - will restart zetia - redraw lipid panel in 6 weeks with [place] or with PCP   Follow up in 1 year.   Medication Adjustments/Labs and Tests Ordered: Current medicines are reviewed at length with the patient today.  Concerns regarding medicines are outlined above.  Orders Placed This Encounter  Procedures  . Basic metabolic panel  . Magnesium  . EKG 12-Lead   Meds ordered this encounter  Medications  . losartan-hydrochlorothiazide (HYZAAR) 100-25 MG tablet    Sig: Take 1 tablet by mouth daily.    Dispense:  90 tablet    Refill:  1  . potassium chloride SA (KLOR-CON) 20 MEQ tablet    Sig: Take 1 tablet (20 mEq total) by mouth daily.    Dispense:  90 tablet     Refill:  1  . ezetimibe (ZETIA) 10 MG tablet    Sig: Take 1 tablet (10 mg total) by mouth daily.    Dispense:  90 tablet    Refill:  1    Signed, Ledora Bottcher, [place]  08/03/2019 12:38 PM    [facility]

## 2019-08-03 ENCOUNTER — Other Ambulatory Visit: Payer: Self-pay

## 2019-08-03 ENCOUNTER — Ambulatory Visit (INDEPENDENT_AMBULATORY_CARE_PROVIDER_SITE_OTHER): Payer: Medicare Other | Admitting: Physician Assistant

## 2019-08-03 ENCOUNTER — Encounter: Payer: Self-pay | Admitting: Physician Assistant

## 2019-08-03 VITALS — BP 138/90 | HR 80 | Temp 93.4°F | Ht 67.0 in | Wt 212.8 lb

## 2019-08-03 DIAGNOSIS — E7800 Pure hypercholesterolemia, unspecified: Secondary | ICD-10-CM | POA: Diagnosis not present

## 2019-08-03 DIAGNOSIS — I2583 Coronary atherosclerosis due to lipid rich plaque: Secondary | ICD-10-CM | POA: Diagnosis not present

## 2019-08-03 DIAGNOSIS — I10 Essential (primary) hypertension: Secondary | ICD-10-CM | POA: Diagnosis not present

## 2019-08-03 DIAGNOSIS — I2510 Atherosclerotic heart disease of native coronary artery without angina pectoris: Secondary | ICD-10-CM | POA: Diagnosis not present

## 2019-08-03 DIAGNOSIS — E876 Hypokalemia: Secondary | ICD-10-CM | POA: Diagnosis not present

## 2019-08-03 DIAGNOSIS — R0789 Other chest pain: Secondary | ICD-10-CM

## 2019-08-03 MED ORDER — POTASSIUM CHLORIDE CRYS ER 20 MEQ PO TBCR: 20.0000 meq | EXTENDED_RELEASE_TABLET | Freq: Every day | ORAL | 1 refills | Status: DC

## 2019-08-03 MED ORDER — EZETIMIBE 10 MG PO TABS: 10.0000 mg | ORAL_TABLET | Freq: Every day | ORAL | 1 refills | Status: DC

## 2019-08-03 MED ORDER — LOSARTAN POTASSIUM-HCTZ 100-25 MG PO TABS: 1.0000 | ORAL_TABLET | Freq: Every day | ORAL | 1 refills | Status: DC

## 2019-08-03 NOTE — Patient Instructions (Signed)
Medication Instructions:   Stop Losartan  START Losartan-HCTZ 100-25 mg daily.  START Ezetimibe (Zetia) 10 mg daily.  START Potassium 20 mEq daily.  *If you need a refill on your cardiac medications before your next appointment, please call your pharmacy*   Lab Work: Your physician recommends that you return for lab work in 2 week: BMET, Magnesium (Please take your lab slips with you).  If you have labs (blood work) drawn today and your tests are completely normal, you will receive your results only by: [name] MyChart Message (if you have MyChart) OR . A paper copy in the mail If you have any lab test that is abnormal or we need to change your treatment, we will call you to review the results.  Follow-Up: At [facility], you and your health needs are our priority.  As part of our continuing mission to provide you with exceptional heart care, we have created designated Provider Care Teams.  These Care Teams include your primary Cardiologist (physician) and Advanced Practice Providers (APPs -  Physician Assistants and Nurse Practitioners) who all work together to provide you with the care you need, when you need it.  We recommend signing up for the patient portal called "MyChart".  Sign up information is provided on this After Visit Summary.  MyChart is used to connect with patients for Virtual Visits (Telemedicine).  Patients are able to view lab/test results, encounter notes, upcoming appointments, etc.  Non-urgent messages can be sent to your provider as well.   To learn more about what you can do with MyChart, go to [email or web address].    Your next appointment:   12 month(s)  The format for your next appointment:   In Person  Provider:   You may see Sanda Klein, MD or one of the following Advanced Practice Providers on your designated Care Team:    Almyra Deforest, PA-C  Fabian Sharp, [name] or   Roby Lofts, [name]    Other Instructions Please call our office two months  in advance to schedule your one year folloow-up appointment with Dr. Sallyanne Kuster.

## 2019-08-18 ENCOUNTER — Other Ambulatory Visit: Payer: Self-pay

## 2019-08-18 ENCOUNTER — Other Ambulatory Visit: Payer: Medicare Other | Admitting: *Deleted

## 2019-08-18 ENCOUNTER — Other Ambulatory Visit: Payer: Medicare Other

## 2019-08-18 DIAGNOSIS — I2583 Coronary atherosclerosis due to lipid rich plaque: Secondary | ICD-10-CM | POA: Diagnosis not present

## 2019-08-18 DIAGNOSIS — I10 Essential (primary) hypertension: Secondary | ICD-10-CM | POA: Diagnosis not present

## 2019-08-18 DIAGNOSIS — I2510 Atherosclerotic heart disease of native coronary artery without angina pectoris: Secondary | ICD-10-CM | POA: Diagnosis not present

## 2019-08-18 DIAGNOSIS — E876 Hypokalemia: Secondary | ICD-10-CM | POA: Diagnosis not present

## 2019-08-18 LAB — BASIC METABOLIC PANEL
BUN/Creatinine Ratio: 15 (ref 12–28)
BUN: 13 mg/dL (ref 8–27)
CO2: 27 mmol/L (ref 20–29)
Calcium: 10.9 mg/dL — ABNORMAL HIGH (ref 8.7–10.3)
Chloride: 98 mmol/L (ref 96–106)
Creatinine, Ser: 0.86 mg/dL (ref 0.57–1.00)
GFR calc Af Amer: 77 mL/min/{1.73_m2} (ref >59)
GFR calc non Af Amer: 67 mL/min/{1.73_m2} (ref >59)
Glucose: 87 mg/dL (ref 65–99)
Potassium: 3.4 mmol/L — ABNORMAL LOW (ref 3.5–5.2)
Sodium: 141 mmol/L (ref 134–144)

## 2019-08-18 LAB — MAGNESIUM: Magnesium: 1.7 mg/dL (ref 1.6–2.3)

## 2019-08-19 ENCOUNTER — Other Ambulatory Visit: Payer: Self-pay | Admitting: Family Medicine

## 2019-08-19 NOTE — Telephone Encounter (Signed)
Patient is calling and requesting a refill for aurobindo sent to [facility] on [address]. CB is [phone]

## 2019-08-20 ENCOUNTER — Other Ambulatory Visit: Payer: Self-pay | Admitting: *Deleted

## 2019-08-20 DIAGNOSIS — E876 Hypokalemia: Secondary | ICD-10-CM

## 2019-08-20 DIAGNOSIS — E7800 Pure hypercholesterolemia, unspecified: Secondary | ICD-10-CM

## 2019-08-20 MED ORDER — PANTOPRAZOLE SODIUM 40 MG PO TBEC
40.0000 mg | DELAYED_RELEASE_TABLET | Freq: Every day | ORAL | 3 refills | Status: DC
Start: 2019-08-20 — End: 2019-12-10

## 2019-08-20 MED ORDER — MAGNESIUM 400 MG PO TABS: 400.0000 mg | ORAL_TABLET | Freq: Every day | ORAL | 3 refills | Status: DC

## 2019-08-20 MED ORDER — POTASSIUM CHLORIDE CRYS ER 20 MEQ PO TBCR: 30.0000 meq | EXTENDED_RELEASE_TABLET | Freq: Every day | ORAL | 1 refills | Status: DC

## 2019-08-20 NOTE — Telephone Encounter (Signed)
Sent Rx per Avel Peace order to [facility].

## 2019-09-07 ENCOUNTER — Telehealth: Payer: Self-pay | Admitting: Family Medicine

## 2019-09-07 NOTE — Telephone Encounter (Signed)
Spoke with patient she stated she will call back in July for a AWV

## 2019-09-08 ENCOUNTER — Telehealth: Payer: Self-pay

## 2019-09-08 NOTE — Telephone Encounter (Signed)
Pt is requesting to transfer from Dr. Ethelene Hal to Dr. Ronnald Ramp.   Is Transfer approved?

## 2019-09-08 NOTE — Telephone Encounter (Signed)
Okay with me 

## 2019-09-08 NOTE — Telephone Encounter (Signed)
Dr. Ronnald Ramp approved as well.   Can you call pt and schedule pt for TOC appt for a month or so out? Sooner if pt would like to come in sooner.

## 2019-09-16 NOTE — Telephone Encounter (Signed)
Pt is scheduled for 10/11/2019 for [facility] appointment.

## 2019-10-11 ENCOUNTER — Ambulatory Visit (INDEPENDENT_AMBULATORY_CARE_PROVIDER_SITE_OTHER): Payer: Medicare Other | Admitting: Internal Medicine

## 2019-10-11 ENCOUNTER — Encounter: Payer: Self-pay | Admitting: Internal Medicine

## 2019-10-11 ENCOUNTER — Other Ambulatory Visit: Payer: Self-pay

## 2019-10-11 VITALS — BP 138/40 | HR 88 | Temp 98.0°F | Resp 16 | Ht 67.0 in | Wt 214.0 lb

## 2019-10-11 DIAGNOSIS — E7801 Familial hypercholesterolemia: Secondary | ICD-10-CM | POA: Diagnosis not present

## 2019-10-11 DIAGNOSIS — E876 Hypokalemia: Secondary | ICD-10-CM

## 2019-10-11 DIAGNOSIS — R2231 Localized swelling, mass and lump, right upper limb: Secondary | ICD-10-CM | POA: Insufficient documentation

## 2019-10-11 DIAGNOSIS — I5189 Other ill-defined heart diseases: Secondary | ICD-10-CM

## 2019-10-11 DIAGNOSIS — I519 Heart disease, unspecified: Secondary | ICD-10-CM

## 2019-10-11 DIAGNOSIS — I10 Essential (primary) hypertension: Secondary | ICD-10-CM

## 2019-10-11 DIAGNOSIS — I2583 Coronary atherosclerosis due to lipid rich plaque: Secondary | ICD-10-CM | POA: Diagnosis not present

## 2019-10-11 DIAGNOSIS — T50905A Adverse effect of unspecified drugs, medicaments and biological substances, initial encounter: Secondary | ICD-10-CM

## 2019-10-11 DIAGNOSIS — R55 Syncope and collapse: Secondary | ICD-10-CM | POA: Insufficient documentation

## 2019-10-11 DIAGNOSIS — R0989 Other specified symptoms and signs involving the circulatory and respiratory systems: Secondary | ICD-10-CM | POA: Diagnosis not present

## 2019-10-11 DIAGNOSIS — I2510 Atherosclerotic heart disease of native coronary artery without angina pectoris: Secondary | ICD-10-CM

## 2019-10-11 LAB — BASIC METABOLIC PANEL
BUN: 15 mg/dL (ref 6–23)
CO2: 31 mEq/L (ref 19–32)
Calcium: 10.9 mg/dL — ABNORMAL HIGH (ref 8.4–10.5)
Chloride: 100 mEq/L (ref 96–112)
Creatinine, Ser: 0.86 mg/dL (ref 0.40–1.20)
GFR: 77.95 mL/min (ref >60.00)
Glucose, Bld: 116 mg/dL — ABNORMAL HIGH (ref 70–99)
Potassium: 3.2 mEq/L — ABNORMAL LOW (ref 3.5–5.1)
Sodium: 141 mEq/L (ref 135–145)

## 2019-10-11 LAB — CBC WITH DIFFERENTIAL/PLATELET
Basophils Absolute: 0 10*3/uL (ref 0.0–0.1)
Basophils Relative: 0.6 % (ref 0.0–3.0)
Eosinophils Absolute: 0.2 10*3/uL (ref 0.0–0.7)
Eosinophils Relative: 4.1 % (ref 0.0–5.0)
HCT: 39.9 % (ref 36.0–46.0)
Hemoglobin: 12.9 g/dL (ref 12.0–15.0)
Lymphocytes Relative: 31.9 % (ref 12.0–46.0)
Lymphs Abs: 1.6 10*3/uL (ref 0.7–4.0)
MCHC: 32.3 g/dL (ref 30.0–36.0)
MCV: 81.1 fl (ref 78.0–100.0)
Monocytes Absolute: 0.3 10*3/uL (ref 0.1–1.0)
Monocytes Relative: 6.6 % (ref 3.0–12.0)
Neutro Abs: 2.8 10*3/uL (ref 1.4–7.7)
Neutrophils Relative %: 56.8 % (ref 43.0–77.0)
Platelets: 206 10*3/uL (ref 150.0–400.0)
RBC: 4.93 Mil/uL (ref 3.87–5.11)
RDW: 14.2 % (ref 11.5–15.5)
WBC: 5 10*3/uL (ref 4.0–10.5)

## 2019-10-11 LAB — TSH: TSH: 3.16 u[IU]/mL (ref 0.35–4.50)

## 2019-10-11 LAB — MAGNESIUM: Magnesium: 1.6 mg/dL (ref 1.5–2.5)

## 2019-10-11 MED ORDER — LOSARTAN POTASSIUM 100 MG PO TABS: 100.0000 mg | ORAL_TABLET | Freq: Every day | ORAL | 0 refills | Status: DC

## 2019-10-11 NOTE — Patient Instructions (Signed)

## 2019-10-11 NOTE — Progress Notes (Signed)
Subjective:  Patient ID: [name], female    DOB: 1945/07/26  Age: 74 y.o. MRN: [id number]  CC: Hypertension and Hyperlipidemia  This visit occurred during the SARS-CoV-2 public health emergency.  Safety protocols were in place, including screening questions prior to the visit, additional usage of staff PPE, and extensive cleaning of exam room while observing appropriate contact time as indicated for disinfecting solutions.    HPI [place] Harman presents for f/up - She complains of a several month history of intermittent near syncope. She thinks the events are associated with a low HR. She had a normal EKG with cardiology about 2 months ago. She also complains of intermittent dizziness but no vertigo. She also complains of a skin mass in her right armpit.  Outpatient Medications Prior to Visit  Medication Sig Dispense Refill   acetaminophen (TYLENOL) 325 MG tablet Take 650 mg by mouth every 6 (six) hours as needed.     cetirizine (ZYRTEC) 10 MG tablet Take 10 mg by mouth daily.     Magnesium 400 MG TABS Take 400 mg by mouth daily. 90 tablet 3   pantoprazole (PROTONIX) 40 MG tablet Take 1 tablet (40 mg total) by mouth daily. 30 tablet 3   Probiotic Product (ALIGN PO) Take 1 tablet by mouth daily.      losartan-hydrochlorothiazide (HYZAAR) 100-25 MG tablet Take 1 tablet by mouth daily. 90 tablet 1   potassium chloride SA (KLOR-CON) 20 MEQ tablet Take 1.5 tablets (30 mEq total) by mouth daily. 90 tablet 1   doxycycline (VIBRA-TABS) 100 MG tablet Take 1 tablet (100 mg total) by mouth 2 (two) times daily. 20 tablet 0   ezetimibe (ZETIA) 10 MG tablet Take 1 tablet (10 mg total) by mouth daily. (Patient not taking: Reported on 10/11/2019) 90 tablet 1   meclizine (ANTIVERT) 25 MG tablet TAKE 1 TABLET(25 MG) BY MOUTH THREE TIMES DAILY AS NEEDED FOR DIZZINESS 90 tablet 0   No facility-administered medications prior to visit.    ROS Review of Systems  Constitutional:  Negative.  Negative for diaphoresis and fatigue.  HENT: Negative.  Negative for trouble swallowing.   Eyes: Negative.  Negative for visual disturbance.  Respiratory: Negative for cough, chest tightness, shortness of breath and wheezing.   Cardiovascular: Positive for leg swelling. Negative for chest pain and palpitations.  Gastrointestinal: Negative for abdominal pain, constipation, diarrhea, nausea and vomiting.  Endocrine: Negative.   Genitourinary: Negative.  Negative for difficulty urinating and dysuria.  Musculoskeletal: Negative.  Negative for arthralgias and neck pain.  Skin: Negative for color change, pallor and rash.  Neurological: Positive for dizziness, syncope and light-headedness. Negative for tremors, facial asymmetry, speech difficulty, weakness, numbness and headaches.  Hematological: Negative for adenopathy. Does not bruise/bleed easily.  Psychiatric/Behavioral: Positive for sleep disturbance. Negative for decreased concentration. The patient is not nervous/anxious.     Objective:  BP (!) 138/40 (BP Location: Left Arm, Patient Position: Sitting, Cuff Size: Large)    Pulse 88    Temp 98 F (36.7 C) (Oral)    Resp 16    Ht 5\' 7"  (1.702 m)    Wt 214 lb (97.1 kg)    SpO2 99%    BMI 33.52 kg/m   BP Readings from Last 3 Encounters:  10/11/19 (!) 138/40  08/03/19 138/90  07/22/19 126/74    Wt Readings from Last 3 Encounters:  10/11/19 214 lb (97.1 kg)  08/03/19 212 lb 12.8 oz (96.5 kg)  07/22/19 213 lb 3.2 oz (  96.7 kg)    Physical Exam Vitals reviewed.  Constitutional:      Appearance: Normal appearance.  HENT:     Nose: Nose normal.     Mouth/Throat:     Mouth: Mucous membranes are moist.  Eyes:     General: No scleral icterus.    Conjunctiva/sclera: Conjunctivae normal.  Cardiovascular:     Rate and Rhythm: Normal rate and regular rhythm.     Heart sounds: No murmur heard.  No gallop.   Pulmonary:     Effort: Pulmonary effort is normal.     Breath sounds:  No stridor. No wheezing, rhonchi or rales.  Abdominal:     General: Abdomen is protuberant. Bowel sounds are normal. There is no distension.     Palpations: Abdomen is soft. There is no hepatomegaly, splenomegaly or mass.     Tenderness: There is no abdominal tenderness.  Musculoskeletal:     Cervical back: Neck supple.     Right lower leg: Edema (1+ non-pitting) present.     Left lower leg: Edema (1+ non-pitting) present.  Lymphadenopathy:     Cervical: No cervical adenopathy.  Skin:    General: Skin is warm and dry.     Coloration: Skin is not pale.     Findings: No rash.     Comments: Rt axilla - 2 cm hard, mobile, nontender SQ mass  Neurological:     General: No focal deficit present.     Mental Status: She is alert and oriented to person, place, and time. Mental status is at baseline.     Cranial Nerves: Cranial nerves are intact.     Sensory: Sensation is intact.     Motor: No weakness, tremor or seizure activity.     Coordination: Coordination is intact.     Gait: Gait is intact.     Deep Tendon Reflexes: Reflexes normal. Babinski sign absent on the right side. Babinski sign absent on the left side.     Reflex Scores:      Tricep reflexes are 0 on the right side and 0 on the left side.      Bicep reflexes are 0 on the right side and 0 on the left side.      Brachioradialis reflexes are 0 on the right side and 0 on the left side.      Patellar reflexes are 0 on the right side and 0 on the left side.      Achilles reflexes are 0 on the right side and 0 on the left side. Psychiatric:        Mood and Affect: Mood normal.        Behavior: Behavior normal.     Lab Results  Component Value Date   WBC 5.0 10/11/2019   HGB 12.9 10/11/2019   HCT 39.9 10/11/2019   PLT 206.0 10/11/2019   GLUCOSE 116 (H) 10/11/2019   CHOL 304 (H) 01/11/2019   TRIG 238.0 (H) 01/11/2019   HDL 62.50 01/11/2019   LDLDIRECT 209.0 01/11/2019   LDLCALC 176 (H) 01/02/2017   ALT 6 01/11/2019   AST  14 01/11/2019   NA 141 10/11/2019   K 3.2 (L) 10/11/2019   CL 100 10/11/2019   CREATININE 0.86 10/11/2019   BUN 15 10/11/2019   CO2 31 10/11/2019   TSH 3.16 10/11/2019    MM DIAG BREAST TOMO UNI RIGHT  Result Date: 01/13/2019 CLINICAL DATA:  History left breast cancer status post mastectomy in 2000. History of right  breast reduction mammoplasty in 2000. Patient complains of diffuse right breast pain. EXAM: DIGITAL DIAGNOSTIC UNILATERAL RIGHT MAMMOGRAM WITH CAD AND TOMO COMPARISON:  Previous exam(s). ACR Breast Density Category b: There are scattered areas of fibroglandular density. FINDINGS: No suspicious mass or malignant type microcalcifications identified in the right breast. Mammographic images were processed with CAD. IMPRESSION: No evidence of malignancy in the right breast. RECOMMENDATION: Unilateral right screening mammogram in 1 year is recommended. I have discussed the findings and recommendations with the patient. If applicable, a reminder letter will be sent to the patient regarding the next appointment. BI-RADS CATEGORY  1: Negative. Electronically Signed   By: Lillia Mountain M.D.   On: 01/13/2019 09:34    Assessment & Plan:   [place] was seen today for hypertension and hyperlipidemia.  Diagnoses and all orders for this visit:  Essential hypertension- Her blood pressure is adequately well controlled but she is symptomatic with a wide pulse pressure, hypokalemia, and hypercalcemia.  I have asked her to stop taking the hydrochlorothiazide but to stay on the ARB. -     CBC with Differential/Platelet; Future -     TSH; Future -     losartan (COZAAR) 100 MG tablet; Take 1 tablet (100 mg total) by mouth daily. -     TSH -     CBC with Differential/Platelet -     potassium chloride SA (KLOR-CON) 20 MEQ tablet; Take 1 tablet (20 mEq total) by mouth 3 (three) times daily.  Hypokalemia- Will increase her potassium supplement to 3 times a day. -     Magnesium; Future -     Basic  metabolic panel; Future -     Basic metabolic panel -     Magnesium -     potassium chloride SA (KLOR-CON) 20 MEQ tablet; Take 1 tablet (20 mEq total) by mouth 3 (three) times daily.  Familial hypercholesterolemia- She is not willing to take Zetia or statin.  Widened pulse pressure- I have asked her to undergo an echocardiogram to see if there is a valvular disorder or any wall motion defects. -     CBC with Differential/Platelet; Future -     ECHOCARDIOGRAM COMPLETE; Future -     CARDIAC EVENT MONITOR; Future -     CBC with Differential/Platelet  Near syncope- She reports that the syncopal episodes are associated with bradycardia so I have asked her to undergo an event monitor.  I recommended she stop taking the thiazide diuretic.  She will undergo an echocardiogram to see if there are any signs of valvular heart disease or pump failure. -     ECHOCARDIOGRAM COMPLETE; Future -     CARDIAC EVENT MONITOR; Future  Mass of right axilla- This may need to be biopsied or excised. -     Ambulatory referral to General Surgery  Hypercalcemia due to a drug- She will stop taking the thiazide diuretic.  I have asked to return in 1 to 2 months to recheck her calcium level and to screen her for hyperparathyroidism.   I have discontinued [place]. Goldberg's meclizine, doxycycline, losartan-hydrochlorothiazide, and ezetimibe. I have also changed her potassium chloride SA. Additionally, I am having her start on losartan. Lastly, I am having her maintain her Probiotic Product (ALIGN PO), cetirizine, acetaminophen, Magnesium, and pantoprazole.  Meds ordered this encounter  Medications   losartan (COZAAR) 100 MG tablet    Sig: Take 1 tablet (100 mg total) by mouth daily.    Dispense:  90 tablet  Refill:  0   potassium chloride SA (KLOR-CON) 20 MEQ tablet    Sig: Take 1 tablet (20 mEq total) by mouth 3 (three) times daily.    Dispense:  270 tablet    Refill:  0   I spent 60 minutes in preparing  to see the patient by review of recent labs, imaging and procedures, obtaining and reviewing separately obtained history, communicating with the patient and family or caregiver, ordering medications, tests or procedures, and documenting clinical information in the EHR including the differential Dx, treatment, and any further evaluation and other management of 1. Essential hypertension 2. Hypokalemia 3. Familial hypercholesterolemia 4. Widened pulse pressure 5. Near syncope 6. Mass of right axilla 7. Hypercalcemia due to a drug     Follow-up: Return in about 6 weeks (around 11/22/2019).  Scarlette Calico, MD

## 2019-10-12 MED ORDER — POTASSIUM CHLORIDE CRYS ER 20 MEQ PO TBCR: 20.0000 meq | EXTENDED_RELEASE_TABLET | Freq: Three times a day (TID) | ORAL | 0 refills | Status: DC

## 2019-10-14 NOTE — Addendum Note (Signed)
Addended by: Karle Barr on: 10/14/2019 08:09 AM   Modules accepted: Orders

## 2019-11-01 ENCOUNTER — Other Ambulatory Visit: Payer: Self-pay

## 2019-11-01 ENCOUNTER — Ambulatory Visit (HOSPITAL_COMMUNITY): Payer: Medicare Other | Attending: Cardiology

## 2019-11-01 DIAGNOSIS — R0989 Other specified symptoms and signs involving the circulatory and respiratory systems: Secondary | ICD-10-CM | POA: Insufficient documentation

## 2019-11-01 DIAGNOSIS — R55 Syncope and collapse: Secondary | ICD-10-CM | POA: Diagnosis not present

## 2019-11-02 DIAGNOSIS — I5189 Other ill-defined heart diseases: Secondary | ICD-10-CM | POA: Insufficient documentation

## 2019-11-05 ENCOUNTER — Ambulatory Visit (INDEPENDENT_AMBULATORY_CARE_PROVIDER_SITE_OTHER): Payer: Medicare Other

## 2019-11-05 DIAGNOSIS — R55 Syncope and collapse: Secondary | ICD-10-CM | POA: Diagnosis not present

## 2019-11-05 DIAGNOSIS — R0989 Other specified symptoms and signs involving the circulatory and respiratory systems: Secondary | ICD-10-CM | POA: Diagnosis not present

## 2019-11-25 ENCOUNTER — Ambulatory Visit (INDEPENDENT_AMBULATORY_CARE_PROVIDER_SITE_OTHER): Payer: Medicare Other

## 2019-11-25 ENCOUNTER — Telehealth: Payer: Self-pay | Admitting: Internal Medicine

## 2019-11-25 ENCOUNTER — Ambulatory Visit (INDEPENDENT_AMBULATORY_CARE_PROVIDER_SITE_OTHER): Payer: Medicare Other | Admitting: Internal Medicine

## 2019-11-25 ENCOUNTER — Other Ambulatory Visit: Payer: Self-pay

## 2019-11-25 ENCOUNTER — Encounter: Payer: Self-pay | Admitting: Internal Medicine

## 2019-11-25 VITALS — BP 130/60 | HR 67 | Temp 97.8°F | Resp 16 | Ht 67.0 in | Wt 214.0 lb

## 2019-11-25 DIAGNOSIS — I2510 Atherosclerotic heart disease of native coronary artery without angina pectoris: Secondary | ICD-10-CM

## 2019-11-25 DIAGNOSIS — M545 Low back pain, unspecified: Secondary | ICD-10-CM | POA: Insufficient documentation

## 2019-11-25 DIAGNOSIS — I10 Essential (primary) hypertension: Secondary | ICD-10-CM

## 2019-11-25 DIAGNOSIS — M5135 Other intervertebral disc degeneration, thoracolumbar region: Secondary | ICD-10-CM

## 2019-11-25 DIAGNOSIS — M546 Pain in thoracic spine: Secondary | ICD-10-CM

## 2019-11-25 DIAGNOSIS — I5189 Other ill-defined heart diseases: Secondary | ICD-10-CM

## 2019-11-25 DIAGNOSIS — T50905A Adverse effect of unspecified drugs, medicaments and biological substances, initial encounter: Secondary | ICD-10-CM | POA: Diagnosis not present

## 2019-11-25 DIAGNOSIS — M47814 Spondylosis without myelopathy or radiculopathy, thoracic region: Secondary | ICD-10-CM | POA: Diagnosis not present

## 2019-11-25 DIAGNOSIS — R739 Hyperglycemia, unspecified: Secondary | ICD-10-CM

## 2019-11-25 DIAGNOSIS — I2583 Coronary atherosclerosis due to lipid rich plaque: Secondary | ICD-10-CM | POA: Diagnosis not present

## 2019-11-25 DIAGNOSIS — E876 Hypokalemia: Secondary | ICD-10-CM | POA: Diagnosis not present

## 2019-11-25 DIAGNOSIS — R7303 Prediabetes: Secondary | ICD-10-CM | POA: Insufficient documentation

## 2019-11-25 DIAGNOSIS — I517 Cardiomegaly: Secondary | ICD-10-CM | POA: Diagnosis not present

## 2019-11-25 DIAGNOSIS — M47816 Spondylosis without myelopathy or radiculopathy, lumbar region: Secondary | ICD-10-CM | POA: Diagnosis not present

## 2019-11-25 MED ORDER — NABUMETONE 500 MG PO TABS: 500.0000 mg | ORAL_TABLET | Freq: Two times a day (BID) | ORAL | 0 refills | Status: DC | PRN

## 2019-11-25 MED ORDER — TRAMADOL HCL 50 MG PO TABS: 50.0000 mg | ORAL_TABLET | Freq: Four times a day (QID) | ORAL | 1 refills | Status: DC | PRN

## 2019-11-25 NOTE — Patient Instructions (Signed)
Acute Back Pain, Adult Acute back pain is sudden and usually short-lived. It is often caused by an injury to the muscles and tissues in the back. The injury may result from:  A muscle or ligament getting overstretched or torn (strained). Ligaments are tissues that connect bones to each other. Lifting something improperly can cause a back strain.  Wear and tear (degeneration) of the spinal disks. Spinal disks are circular tissue that provides cushioning between the bones of the spine (vertebrae).  Twisting motions, such as while playing sports or doing yard work.  A hit to the back.  Arthritis. You may have a physical exam, lab tests, and imaging tests to find the cause of your pain. Acute back pain usually goes away with rest and home care. Follow these instructions at home: Managing pain, stiffness, and swelling  Take over-the-counter and prescription medicines only as told by your health care provider.  Your health care provider may recommend applying ice during the first 24-48 hours after your pain starts. To do this: ? Put ice in a plastic bag. ? Place a towel between your skin and the bag. ? Leave the ice on for 20 minutes, 2-3 times a day.  If directed, apply heat to the affected area as often as told by your health care provider. Use the heat source that your health care provider recommends, such as a moist heat pack or a heating pad. ? Place a towel between your skin and the heat source. ? Leave the heat on for 20-30 minutes. ? Remove the heat if your skin turns bright red. This is especially important if you are unable to feel pain, heat, or cold. You have a greater risk of getting burned. Activity   Do not stay in bed. Staying in bed for more than 1-2 days can delay your recovery.  Sit up and stand up straight. Avoid leaning forward when you sit, or hunching over when you stand. ? If you work at a desk, sit close to it so you do not need to lean over. Keep your chin tucked  in. Keep your neck drawn back, and keep your elbows bent at a right angle. Your arms should look like the letter "L." ? Sit high and close to the steering wheel when you drive. Add lower back (lumbar) support to your car seat, if needed.  Take short walks on even surfaces as soon as you are able. Try to increase the length of time you walk each day.  Do not sit, drive, or stand in one place for more than 30 minutes at a time. Sitting or standing for long periods of time can put stress on your back.  Do not drive or use heavy machinery while taking prescription pain medicine.  Use proper lifting techniques. When you bend and lift, use positions that put less stress on your back: ? Bend your knees. ? Keep the load close to your body. ? Avoid twisting.  Exercise regularly as told by your health care provider. Exercising helps your back heal faster and helps prevent back injuries by keeping muscles strong and flexible.  Work with a physical therapist to make a safe exercise program, as recommended by your health care provider. Do any exercises as told by your physical therapist. Lifestyle  Maintain a healthy weight. Extra weight puts stress on your back and makes it difficult to have good posture.  Avoid activities or situations that make you feel anxious or stressed. Stress and anxiety increase muscle   tension and can make back pain worse. Learn ways to manage anxiety and stress, such as through exercise. General instructions  Sleep on a firm mattress in a comfortable position. Try lying on your side with your knees slightly bent. If you lie on your back, put a pillow under your knees.  Follow your treatment plan as told by your health care provider. This may include: ? Cognitive or behavioral therapy. ? Acupuncture or massage therapy. ? Meditation or yoga. Contact a health care provider if:  You have pain that is not relieved with rest or medicine.  You have increasing pain going down  into your legs or buttocks.  Your pain does not improve after 2 weeks.  You have pain at night.  You lose weight without trying.  You have a fever or chills. Get help right away if:  You develop new bowel or bladder control problems.  You have unusual weakness or numbness in your arms or legs.  You develop nausea or vomiting.  You develop abdominal pain.  You feel faint. Summary  Acute back pain is sudden and usually short-lived.  Use proper lifting techniques. When you bend and lift, use positions that put less stress on your back.  Take over-the-counter and prescription medicines and apply heat or ice as directed by your health care provider. This information is not intended to replace advice given to you by your health care provider. Make sure you discuss any questions you have with your health care provider. Document Revised: 07/28/2018 Document Reviewed: 11/20/2016 Elsevier Patient Education  2020 [employer]  

## 2019-11-25 NOTE — Progress Notes (Signed)
Subjective:  Patient ID: [name], female    DOB: Oct 30, 1945  Age: 74 y.o. MRN: [id number]  CC: Hypertension and Back Pain  This visit occurred during the SARS-CoV-2 public health emergency.  Safety protocols were in place, including screening questions prior to the visit, additional usage of staff PPE, and extensive cleaning of exam room while observing appropriate contact time as indicated for disinfecting solutions.    HPI [name] presents for f/up -she complains of a 2-day history of pain in her lower mid and lower back.  She describes it as a constant throbbing sensation that increases with movement.  She has not gotten much symptom relief with Aleve.  She denies trauma or injury.  She says the pain does not radiate towards her extremities and she denies paresthesias.  When I last saw her she was taking a thiazide diuretic and had a calcium level of 10.9.  She had had a few near syncopal episodes and had a widened pulse pressure.  She has stopped taking the thiazide diuretic and an echocardiogram showed DD but her valves were normal.  She denies any more presyncopal episodes and she denies chest pain, shortness of breath, palpitations, edema, or fatigue.  Outpatient Medications Prior to Visit  Medication Sig Dispense Refill  . acetaminophen (TYLENOL) 325 MG tablet Take 650 mg by mouth every 6 (six) hours as needed.    . cetirizine (ZYRTEC) 10 MG tablet Take 10 mg by mouth daily.    [name] losartan (COZAAR) 100 MG tablet Take 1 tablet (100 mg total) by mouth daily. 90 tablet 0  . Magnesium 400 MG TABS Take 400 mg by mouth daily. 90 tablet 3  . pantoprazole (PROTONIX) 40 MG tablet Take 1 tablet (40 mg total) by mouth daily. 30 tablet 3  . potassium chloride SA (KLOR-CON) 20 MEQ tablet Take 1 tablet (20 mEq total) by mouth 3 (three) times daily. 270 tablet 0  . Probiotic Product (ALIGN PO) Take 1 tablet by mouth daily.      No facility-administered medications prior to visit.     ROS Review of Systems  Constitutional: Negative.  Negative for appetite change, diaphoresis, fatigue and unexpected weight change.  HENT: Negative.   Respiratory: Negative.  Negative for cough, chest tightness, shortness of breath and wheezing.   Cardiovascular: Negative for chest pain, palpitations and leg swelling.  Gastrointestinal: Negative for abdominal pain, diarrhea, nausea and vomiting.  Endocrine: Negative.   Genitourinary: Negative for difficulty urinating.  Musculoskeletal: Positive for back pain. Negative for arthralgias, myalgias and neck pain.  Skin: Negative for color change, pallor and rash.  Neurological: Negative.  Negative for dizziness, weakness, light-headedness, numbness and headaches.  Hematological: Negative for adenopathy. Does not bruise/bleed easily.  Psychiatric/Behavioral: Negative.     Objective:  BP 130/60 (BP Location: Right Arm, Patient Position: Sitting, Cuff Size: Large)   Pulse 67   Temp 97.8 F (36.6 C) (Oral)   Resp 16   Ht 5\' 7"  (1.702 m)   Wt 214 lb (97.1 kg)   SpO2 97%   BMI 33.52 kg/m   BP Readings from Last 3 Encounters:  11/25/19 130/60  10/11/19 (!) 138/40  08/03/19 138/90    Wt Readings from Last 3 Encounters:  11/25/19 214 lb (97.1 kg)  10/11/19 214 lb (97.1 kg)  08/03/19 212 lb 12.8 oz (96.5 kg)    Physical Exam Vitals reviewed.  Constitutional:      Appearance: Normal appearance.  HENT:  Mouth/Throat:     Mouth: Mucous membranes are moist.  Cardiovascular:     Rate and Rhythm: Normal rate and regular rhythm.     Heart sounds: No murmur heard.   Pulmonary:     Effort: Pulmonary effort is normal.     Breath sounds: No stridor. No wheezing, rhonchi or rales.  Abdominal:     General: Abdomen is flat.     Palpations: There is no mass.     Tenderness: There is no abdominal tenderness. There is no guarding.  Musculoskeletal:        General: Normal range of motion.     Cervical back: Normal and neck supple.       Thoracic back: Bony tenderness present. No swelling, deformity or tenderness. Normal range of motion.     Lumbar back: Bony tenderness present. No swelling or tenderness. Negative right straight leg raise test and negative left straight leg raise test.     Right lower leg: No edema.     Left lower leg: No edema.  Lymphadenopathy:     Cervical: No cervical adenopathy.  Skin:    General: Skin is warm and dry.  Neurological:     General: No focal deficit present.     Mental Status: She is alert.     Cranial Nerves: Cranial nerves are intact.     Sensory: Sensation is intact.     Motor: Motor function is intact. No weakness.     Deep Tendon Reflexes: Reflexes normal.     Lab Results  Component Value Date   WBC 5.0 10/11/2019   HGB 12.9 10/11/2019   HCT 39.9 10/11/2019   PLT 206.0 10/11/2019   GLUCOSE 98 11/25/2019   CHOL 304 (H) 01/11/2019   TRIG 238.0 (H) 01/11/2019   HDL 62.50 01/11/2019   LDLDIRECT 209.0 01/11/2019   LDLCALC 176 (H) 01/02/2017   ALT 6 01/11/2019   AST 14 01/11/2019   NA 140 11/25/2019   K 4.1 11/25/2019   CL 107 11/25/2019   CREATININE 0.75 11/25/2019   BUN 9 11/25/2019   CO2 27 11/25/2019   TSH 3.16 10/11/2019   HGBA1C 5.8 (H) 11/25/2019    MM DIAG BREAST TOMO UNI RIGHT  Result Date: 01/13/2019 CLINICAL DATA:  History left breast cancer status post mastectomy in 2000. History of right breast reduction mammoplasty in 2000. Patient complains of diffuse right breast pain. EXAM: DIGITAL DIAGNOSTIC UNILATERAL RIGHT MAMMOGRAM WITH CAD AND TOMO COMPARISON:  Previous exam(s). ACR Breast Density Category b: There are scattered areas of fibroglandular density. FINDINGS: No suspicious mass or malignant type microcalcifications identified in the right breast. Mammographic images were processed with CAD. IMPRESSION: No evidence of malignancy in the right breast. RECOMMENDATION: Unilateral right screening mammogram in 1 year is recommended. I have discussed the  findings and recommendations with the patient. If applicable, a reminder letter will be sent to the patient regarding the next appointment. BI-RADS CATEGORY  1: Negative. Electronically Signed   By: Lillia Mountain M.D.   On: 01/13/2019 09:34   IMPRESSIONS    1. Left ventricular ejection fraction, by estimation, is 60 to 65%. The  left ventricle has normal function. The left ventricle has no regional  wall motion abnormalities. There is mild concentric left ventricular  hypertrophy. Left ventricular diastolic  parameters are consistent with Grade I diastolic dysfunction (impaired  relaxation).  2. Right ventricular systolic function is normal. The right ventricular  size is normal.  3. The mitral valve is normal  in structure. No evidence of mitral valve  regurgitation. No evidence of mitral stenosis.  4. The aortic valve is normal in structure. Aortic valve regurgitation is  not visualized. No aortic stenosis is present.  5. The inferior vena cava is normal in size with greater than 50%  respiratory variability, suggesting right atrial pressure of 3 mmHg.    DG Thoracic Spine W/Swimmers  Result Date: 11/25/2019 CLINICAL DATA:  Pain EXAM: THORACIC SPINE - 3 VIEWS COMPARISON:  May 16, 2004. FINDINGS: There is no acute compression fracture. There are degenerative changes of the lower thoracic levels. The alignment is unremarkable. The visualized portions of the lungs are unremarkable. IMPRESSION: 1. No acute compression fracture. 2. Degenerative changes of the lower thoracic levels. Electronically Signed   By: Constance Holster M.D.   On: 11/25/2019 15:39   DG Lumbar Spine Complete  Result Date: 11/25/2019 CLINICAL DATA:  Back pain x2 days. EXAM: LUMBAR SPINE - COMPLETE 4+ VIEW COMPARISON:  12/21/2017 FINDINGS: There is no acute compression fracture. There is facet arthrosis in the lower lumbar segments. There are advanced degenerative changes at the T11-T12 level. There is mild disc  degenerative disease throughout the lumbar spine. Aortic atherosclerosis is noted. IMPRESSION: 1. No acute osseous abnormality. 2. Multilevel degenerative changes as above. 3. Aortic atherosclerosis. Electronically Signed   By: Constance Holster M.D.   On: 11/25/2019 15:38    Assessment & Plan:   [name] was seen today for hypertension and back pain.  Diagnoses and all orders for this visit:  Hypercalcemia due to a drug- Her calcium level is normal now that she has discontinued the thiazide diuretic.  I will screen her for hyperparathyroidism. -     PTH, intact and calcium; Future -     BASIC METABOLIC PANEL WITH GFR; Future -     BASIC METABOLIC PANEL WITH GFR -     PTH, intact and calcium  Mild concentric left ventricular hypertrophy (LVH)- Her blood pressure is adequately well controlled and her volume status is normal.  Diastolic dysfunction without heart failure- She is asymptomatic with this.  If she needs a diuretic then I will start a loop diuretic.  Essential hypertension- Her blood pressure is adequately well controlled. -     BASIC METABOLIC PANEL WITH GFR; Future -     BASIC METABOLIC PANEL WITH GFR  Hypokalemia- Her potassium level is normal. -     BASIC METABOLIC PANEL WITH GFR; Future -     BASIC METABOLIC PANEL WITH GFR  Acute bilateral low back pain without sciatica- She is neurologically intact.  Plain films are remarkable for degenerative changes.  I recommended she treat this with nabumetone and tramadol. -     DG Lumbar Spine Complete; Future -     nabumetone (RELAFEN) 500 MG tablet; Take 1 tablet (500 mg total) by mouth 2 (two) times daily as needed. -     traMADol (ULTRAM) 50 MG tablet; Take 1 tablet (50 mg total) by mouth every 6 (six) hours as needed.  Acute bilateral thoracic back pain -     DG Thoracic Spine W/Swimmers; Future -     nabumetone (RELAFEN) 500 MG tablet; Take 1 tablet (500 mg total) by mouth 2 (two) times daily as needed. -     traMADol  (ULTRAM) 50 MG tablet; Take 1 tablet (50 mg total) by mouth every 6 (six) hours as needed.  Hyperglycemia- Her A1c is at 5.8%.  Medical therapy is not indicated. -  Hemoglobin A1c; Future -     Hemoglobin A1c  DDD (degenerative disc disease), thoracolumbar -     nabumetone (RELAFEN) 500 MG tablet; Take 1 tablet (500 mg total) by mouth 2 (two) times daily as needed. -     traMADol (ULTRAM) 50 MG tablet; Take 1 tablet (50 mg total) by mouth every 6 (six) hours as needed.   I am having Brooke. Kithcart start on nabumetone and traMADol. I am also having her maintain her Probiotic Product (ALIGN PO), cetirizine, acetaminophen, Magnesium, pantoprazole, losartan, and potassium chloride SA.  Meds ordered this encounter  Medications  . nabumetone (RELAFEN) 500 MG tablet    Sig: Take 1 tablet (500 mg total) by mouth 2 (two) times daily as needed.    Dispense:  180 tablet    Refill:  0  . traMADol (ULTRAM) 50 MG tablet    Sig: Take 1 tablet (50 mg total) by mouth every 6 (six) hours as needed.    Dispense:  65 tablet    Refill:  1   I spent 50 minutes in preparing to see the patient by review of recent labs, imaging and procedures, obtaining and reviewing separately obtained history, communicating with the patient and family or caregiver, ordering medications, tests or procedures, and documenting clinical information in the EHR including the differential Dx, treatment, and any further evaluation and other management of 1. Hypercalcemia due to a drug 2. Mild concentric left ventricular hypertrophy (LVH) 3. Diastolic dysfunction without heart failure 4. Essential hypertension 5. Hypokalemia 6. Acute bilateral low back pain without sciatica 7. Acute bilateral thoracic back pain 8. Hyperglycemia 9. DDD (degenerative disc disease), thoracolumbar     Follow-up: Return in about 3 months (around 02/25/2020).  Scarlette Calico, MD

## 2019-11-25 NOTE — Telephone Encounter (Signed)
New Message:    Pt is calling and states she had an x-ray done the other day and was told by Dr. Ronnald Ramp if she needed something for pain then he would provide her with some. Pt states she is now needing something for pain. She would like it sent to [facility] [place]. Please advise.

## 2019-11-26 LAB — HEMOGLOBIN A1C
Hgb A1c MFr Bld: 5.8 % of total Hgb — ABNORMAL HIGH (ref <5.7)
Mean Plasma Glucose: 120 (calc)
eAG (mmol/L): 6.6 (calc)

## 2019-11-26 LAB — BASIC METABOLIC PANEL WITH GFR
BUN: 9 mg/dL (ref 7–25)
CO2: 27 mmol/L (ref 20–32)
Calcium: 10.1 mg/dL (ref 8.6–10.4)
Chloride: 107 mmol/L (ref 98–110)
Creat: 0.75 mg/dL (ref 0.60–0.93)
GFR, Est African American: 91 mL/min/{1.73_m2} (ref >=60)
GFR, Est Non African American: 79 mL/min/{1.73_m2} (ref >=60)
Glucose, Bld: 98 mg/dL (ref 65–99)
Potassium: 4.1 mmol/L (ref 3.5–5.3)
Sodium: 140 mmol/L (ref 135–146)

## 2019-11-26 LAB — PTH, INTACT AND CALCIUM
Calcium: 10.1 mg/dL (ref 8.6–10.4)
PTH: 67 pg/mL — ABNORMAL HIGH (ref 14–64)

## 2019-12-07 ENCOUNTER — Other Ambulatory Visit: Payer: Self-pay | Admitting: Obstetrics and Gynecology

## 2019-12-07 DIAGNOSIS — Z1231 Encounter for screening mammogram for malignant neoplasm of breast: Secondary | ICD-10-CM

## 2019-12-10 ENCOUNTER — Other Ambulatory Visit: Payer: Self-pay | Admitting: Internal Medicine

## 2020-01-04 ENCOUNTER — Other Ambulatory Visit: Payer: Self-pay | Admitting: Internal Medicine

## 2020-01-04 DIAGNOSIS — M545 Low back pain, unspecified: Secondary | ICD-10-CM

## 2020-01-04 DIAGNOSIS — M546 Pain in thoracic spine: Secondary | ICD-10-CM

## 2020-01-04 DIAGNOSIS — M5135 Other intervertebral disc degeneration, thoracolumbar region: Secondary | ICD-10-CM

## 2020-01-12 ENCOUNTER — Other Ambulatory Visit: Payer: Self-pay | Admitting: Internal Medicine

## 2020-01-12 DIAGNOSIS — I10 Essential (primary) hypertension: Secondary | ICD-10-CM

## 2020-01-13 DIAGNOSIS — Z23 Encounter for immunization: Secondary | ICD-10-CM | POA: Diagnosis not present

## 2020-01-14 ENCOUNTER — Other Ambulatory Visit: Payer: Self-pay

## 2020-01-14 ENCOUNTER — Ambulatory Visit
Admission: RE | Admit: 2020-01-14 | Discharge: 2020-01-14 | Disposition: A | Discharge status: Home / Self Care | Payer: Medicare Other | Source: Ambulatory Visit | Attending: Obstetrics and Gynecology | Admitting: Obstetrics and Gynecology

## 2020-01-14 DIAGNOSIS — Z1231 Encounter for screening mammogram for malignant neoplasm of breast: Secondary | ICD-10-CM

## 2020-01-18 DIAGNOSIS — H04123 Dry eye syndrome of bilateral lacrimal glands: Secondary | ICD-10-CM | POA: Diagnosis not present

## 2020-01-18 DIAGNOSIS — Z961 Presence of intraocular lens: Secondary | ICD-10-CM | POA: Diagnosis not present

## 2020-01-18 DIAGNOSIS — H1045 Other chronic allergic conjunctivitis: Secondary | ICD-10-CM | POA: Diagnosis not present

## 2020-01-18 DIAGNOSIS — H43811 Vitreous degeneration, right eye: Secondary | ICD-10-CM | POA: Diagnosis not present

## 2020-01-18 DIAGNOSIS — H35373 Puckering of macula, bilateral: Secondary | ICD-10-CM | POA: Diagnosis not present

## 2020-01-20 DIAGNOSIS — Z23 Encounter for immunization: Secondary | ICD-10-CM | POA: Diagnosis not present

## 2020-02-01 DIAGNOSIS — R2231 Localized swelling, mass and lump, right upper limb: Secondary | ICD-10-CM | POA: Diagnosis not present

## 2020-02-01 DIAGNOSIS — D225 Melanocytic nevi of trunk: Secondary | ICD-10-CM | POA: Diagnosis not present

## 2020-02-08 ENCOUNTER — Other Ambulatory Visit: Payer: Self-pay | Admitting: Internal Medicine

## 2020-02-08 DIAGNOSIS — R3912 Poor urinary stream: Secondary | ICD-10-CM | POA: Diagnosis not present

## 2020-02-08 DIAGNOSIS — R300 Dysuria: Secondary | ICD-10-CM | POA: Diagnosis not present

## 2020-02-08 DIAGNOSIS — R3913 Splitting of urinary stream: Secondary | ICD-10-CM | POA: Diagnosis not present

## 2020-02-08 DIAGNOSIS — R3914 Feeling of incomplete bladder emptying: Secondary | ICD-10-CM | POA: Diagnosis not present

## 2020-02-08 DIAGNOSIS — R310 Gross hematuria: Secondary | ICD-10-CM | POA: Diagnosis not present

## 2020-02-08 DIAGNOSIS — I10 Essential (primary) hypertension: Secondary | ICD-10-CM

## 2020-02-08 DIAGNOSIS — R350 Frequency of micturition: Secondary | ICD-10-CM | POA: Diagnosis not present

## 2020-02-08 MED ORDER — LOSARTAN POTASSIUM 100 MG PO TABS: 100.0000 mg | ORAL_TABLET | Freq: Every day | ORAL | 1 refills | Status: DC

## 2020-02-17 DIAGNOSIS — N200 Calculus of kidney: Secondary | ICD-10-CM | POA: Diagnosis not present

## 2020-02-17 DIAGNOSIS — Z90710 Acquired absence of both cervix and uterus: Secondary | ICD-10-CM | POA: Diagnosis not present

## 2020-02-17 DIAGNOSIS — I700 Atherosclerosis of aorta: Secondary | ICD-10-CM | POA: Diagnosis not present

## 2020-02-17 DIAGNOSIS — R310 Gross hematuria: Secondary | ICD-10-CM | POA: Diagnosis not present

## 2020-02-24 DIAGNOSIS — Z01818 Encounter for other preprocedural examination: Secondary | ICD-10-CM | POA: Diagnosis not present

## 2020-02-28 ENCOUNTER — Ambulatory Visit: Payer: Medicare Other | Admitting: Internal Medicine

## 2020-02-28 ENCOUNTER — Other Ambulatory Visit: Payer: Self-pay | Admitting: Surgery

## 2020-02-28 DIAGNOSIS — L820 Inflamed seborrheic keratosis: Secondary | ICD-10-CM | POA: Diagnosis not present

## 2020-02-28 DIAGNOSIS — L720 Epidermal cyst: Secondary | ICD-10-CM | POA: Diagnosis not present

## 2020-02-28 DIAGNOSIS — L821 Other seborrheic keratosis: Secondary | ICD-10-CM | POA: Diagnosis not present

## 2020-03-01 ENCOUNTER — Telehealth: Payer: Self-pay | Admitting: Internal Medicine

## 2020-03-01 NOTE — Telephone Encounter (Signed)
LVM for pt to rtn my call to schedule AWV with [facility]. Please schedule if patient calls the office back.

## 2020-03-03 DIAGNOSIS — N3946 Mixed incontinence: Secondary | ICD-10-CM | POA: Diagnosis not present

## 2020-03-03 DIAGNOSIS — D414 Neoplasm of uncertain behavior of bladder: Secondary | ICD-10-CM | POA: Diagnosis not present

## 2020-03-03 DIAGNOSIS — R310 Gross hematuria: Secondary | ICD-10-CM | POA: Diagnosis not present

## 2020-03-07 ENCOUNTER — Other Ambulatory Visit: Payer: Self-pay | Admitting: Urology

## 2020-03-08 ENCOUNTER — Other Ambulatory Visit: Payer: Self-pay

## 2020-03-08 ENCOUNTER — Ambulatory Visit (INDEPENDENT_AMBULATORY_CARE_PROVIDER_SITE_OTHER): Payer: Medicare Other | Admitting: Internal Medicine

## 2020-03-08 ENCOUNTER — Encounter: Payer: Self-pay | Admitting: Internal Medicine

## 2020-03-08 VITALS — BP 132/68 | HR 86 | Temp 98.3°F | Resp 16 | Ht 67.0 in | Wt 212.8 lb

## 2020-03-08 DIAGNOSIS — I5189 Other ill-defined heart diseases: Secondary | ICD-10-CM

## 2020-03-08 DIAGNOSIS — I2583 Coronary atherosclerosis due to lipid rich plaque: Secondary | ICD-10-CM

## 2020-03-08 DIAGNOSIS — E7801 Familial hypercholesterolemia: Secondary | ICD-10-CM

## 2020-03-08 DIAGNOSIS — I10 Essential (primary) hypertension: Secondary | ICD-10-CM

## 2020-03-08 DIAGNOSIS — I2510 Atherosclerotic heart disease of native coronary artery without angina pectoris: Secondary | ICD-10-CM

## 2020-03-08 DIAGNOSIS — R600 Localized edema: Secondary | ICD-10-CM | POA: Diagnosis not present

## 2020-03-08 MED ORDER — TORSEMIDE 20 MG PO TABS: 20.0000 mg | ORAL_TABLET | Freq: Every day | ORAL | 1 refills | Status: DC

## 2020-03-08 NOTE — Patient Instructions (Signed)

## 2020-03-08 NOTE — Progress Notes (Signed)
Subjective:  Patient ID: [name], female    DOB: December 12, 1945  Age: 74 y.o. MRN: [id number]  CC: Follow-up (3 month for HTN) and Hypertension  This visit occurred during the SARS-CoV-2 public health emergency.  Safety protocols were in place, including screening questions prior to the visit, additional usage of staff PPE, and extensive cleaning of exam room while observing appropriate contact time as indicated for disinfecting solutions.    HPI [name] presents for f/up - She tells me that her blood pressure is relatively well controlled but she complains of edema around her ankles.  She is active and denies any recent episodes of chest pain, shortness of breath, palpitations, or fatigue.  Outpatient Medications Prior to Visit  Medication Sig Dispense Refill  . acetaminophen (TYLENOL) 325 MG tablet Take 650 mg by mouth every 6 (six) hours as needed.    . cetirizine (ZYRTEC) 10 MG tablet Take 10 mg by mouth daily.    [name] losartan (COZAAR) 100 MG tablet Take 1 tablet (100 mg total) by mouth daily. 90 tablet 1  . Magnesium 400 MG TABS Take 400 mg by mouth daily. 90 tablet 3  . meclizine (ANTIVERT) 25 MG tablet Take 25 mg by mouth 3 (three) times daily as needed for dizziness.    . nabumetone (RELAFEN) 500 MG tablet TAKE 1 TABLET(500 MG) BY MOUTH TWICE DAILY AS NEEDED 180 tablet 0  . pantoprazole (PROTONIX) 40 MG tablet TAKE 1 TABLET(40 MG) BY MOUTH DAILY 30 tablet 3  . potassium chloride SA (KLOR-CON) 20 MEQ tablet Take 1 tablet (20 mEq total) by mouth 3 (three) times daily. 270 tablet 0  . Probiotic Product (ALIGN PO) Take 1 tablet by mouth daily.     . traMADol (ULTRAM) 50 MG tablet Take 1 tablet (50 mg total) by mouth every 6 (six) hours as needed. 65 tablet 1   No facility-administered medications prior to visit.    ROS Review of Systems  Constitutional: Negative for diaphoresis, fatigue and unexpected weight change.  HENT: Negative.   Eyes: Negative.     Respiratory: Negative for cough, chest tightness and shortness of breath.   Cardiovascular: Positive for leg swelling. Negative for chest pain and palpitations.  Gastrointestinal: Negative for abdominal pain, constipation and nausea.  Endocrine: Negative.   Genitourinary: Negative.  Negative for difficulty urinating.  Musculoskeletal: Negative for arthralgias and myalgias.  Skin: Negative.  Negative for color change and pallor.  Neurological: Negative.  Negative for dizziness and weakness.  Hematological: Negative for adenopathy. Does not bruise/bleed easily.  Psychiatric/Behavioral: Negative.     Objective:  BP 132/68 (BP Location: Right Arm, Patient Position: Sitting, Cuff Size: Normal)   Pulse 86   Temp 98.3 F (36.8 C) (Oral)   Resp 16   Ht 5\' 7"  (1.702 m)   Wt 212 lb 12.8 oz (96.5 kg)   SpO2 97%   BMI 33.33 kg/m   BP Readings from Last 3 Encounters:  03/08/20 132/68  11/25/19 130/60  10/11/19 (!) 138/40    Wt Readings from Last 3 Encounters:  03/08/20 212 lb 12.8 oz (96.5 kg)  11/25/19 214 lb (97.1 kg)  10/11/19 214 lb (97.1 kg)    Physical Exam Vitals reviewed.  Constitutional:      Appearance: Normal appearance.  HENT:     Nose: Nose normal.     Mouth/Throat:     Mouth: Mucous membranes are moist.  Eyes:     General: No scleral icterus.  Conjunctiva/sclera: Conjunctivae normal.  Cardiovascular:     Rate and Rhythm: Normal rate and regular rhythm.     Heart sounds: No murmur heard.   Pulmonary:     Breath sounds: No stridor. No wheezing, rhonchi or rales.  Abdominal:     General: Abdomen is flat.     Tenderness: There is no abdominal tenderness. There is no guarding.  Musculoskeletal:     Cervical back: Neck supple.     Right lower leg: Edema (trace pitting ankle edema) present.     Left lower leg: Edema (trace pitting ankle edema) present.  Lymphadenopathy:     Cervical: No cervical adenopathy.  Skin:    General: Skin is warm and dry.   Neurological:     General: No focal deficit present.     Mental Status: She is alert.  Psychiatric:        Mood and Affect: Mood normal.        Behavior: Behavior normal.     Lab Results  Component Value Date   WBC 5.0 10/11/2019   HGB 12.9 10/11/2019   HCT 39.9 10/11/2019   PLT 206.0 10/11/2019   GLUCOSE 98 11/25/2019   CHOL 304 (H) 01/11/2019   TRIG 238.0 (H) 01/11/2019   HDL 62.50 01/11/2019   LDLDIRECT 209.0 01/11/2019   LDLCALC 176 (H) 01/02/2017   ALT 6 01/11/2019   AST 14 01/11/2019   NA 140 11/25/2019   K 4.1 11/25/2019   CL 107 11/25/2019   CREATININE 0.75 11/25/2019   BUN 9 11/25/2019   CO2 27 11/25/2019   TSH 3.16 10/11/2019   HGBA1C 5.8 (H) 11/25/2019    MM 3D SCREEN BREAST UNI RIGHT  Result Date: 01/17/2020 CLINICAL DATA:  Screening. EXAM: DIGITAL SCREENING UNILATERAL RIGHT MAMMOGRAM WITH CAD AND TOMO COMPARISON:  Previous exam(s). ACR Breast Density Category b: There are scattered areas of fibroglandular density. FINDINGS: There are no findings suspicious for malignancy. Images were processed with CAD. IMPRESSION: No mammographic evidence of malignancy. A result letter of this screening mammogram will be mailed directly to the patient. RECOMMENDATION: Screening mammogram in one year. (Code:SM-B-01Y) BI-RADS CATEGORY  1: Negative. Electronically Signed   By: Lajean Manes M.D.   On: 01/17/2020 12:46    Assessment & Plan:   [name] was seen today for follow-up and hypertension.  Diagnoses and all orders for this visit:  Familial hypercholesterolemia- I will recheck her lipid panel.  She is not willing to take a statin. -     Lipid panel; Future  Bilateral leg edema -     torsemide (DEMADEX) 20 MG tablet; Take 1 tablet (20 mg total) by mouth daily.  Essential hypertension -     torsemide (DEMADEX) 20 MG tablet; Take 1 tablet (20 mg total) by mouth daily.  Diastolic dysfunction without heart failure- The only symptom she has related to this is ankle  edema.  She will start taking a loop diuretic.   I am having Brooke. [name] start on torsemide. I am also having her maintain her Probiotic Product (ALIGN PO), cetirizine, acetaminophen, Magnesium, potassium chloride SA, traMADol, pantoprazole, nabumetone, losartan, and meclizine.  Meds ordered this encounter  Medications  . torsemide (DEMADEX) 20 MG tablet    Sig: Take 1 tablet (20 mg total) by mouth daily.    Dispense:  90 tablet    Refill:  1     Follow-up: Return in about 6 months (around 09/05/2020).  Scarlette Calico, MD

## 2020-03-09 ENCOUNTER — Encounter (HOSPITAL_BASED_OUTPATIENT_CLINIC_OR_DEPARTMENT_OTHER): Payer: Self-pay | Admitting: Urology

## 2020-03-10 ENCOUNTER — Ambulatory Visit: Payer: Medicare Other

## 2020-03-10 ENCOUNTER — Encounter (HOSPITAL_BASED_OUTPATIENT_CLINIC_OR_DEPARTMENT_OTHER): Payer: Self-pay | Admitting: Urology

## 2020-03-10 ENCOUNTER — Other Ambulatory Visit: Payer: Self-pay

## 2020-03-10 NOTE — Progress Notes (Addendum)
Spoke w/ via phone for pre-op interview--- PT  Lab needs dos----  Istat             Lab results------ current ekg in epic/ chart COVID test ------ 03-18-2020 @ 1050 Arrive at ------- 0900 NPO after MN NO Solid Food.  Clear liquids from MN until--- 0800 Medications to take morning of surgery ----- NONE Diabetic medication ----- n/a Patient Special Instructions ----- n/a Pre-Op special Istructions ----- n/a Patient verbalized understanding of instructions that were given at this phone interview. Patient denies shortness of breath, chest pain, fever, cough at this phone interview.   Anesthesia :  HTN, mild nonob CAD, hx Breast cancer left 2000 and completed chemoradiation same year, no recurrence;  GERD.  Pt denies any cardiac s&s, sob, but does have lower extremity edema.   PCP:  Dr Lorin Glass (lov 03-08-2020) Cardiologist : previous was seen by cardiology dr Debara Pickett 07-16-2016 note in epic Chest x-ray :  09-23-2011 epic EKG : 08-03-2019 epic Echo : 11-01-2019 epic Stress test: 07-31-2016 epic Event monitir:  12-07-2019 epic Cardiac Cath :  05-10-2003 epic Activity level:  No issues Sleep Study/ CPAP :  NO Fasting Blood Sugar :      / Checks Blood Sugar -- times a day:   N/A Blood Thinner/ Instructions Maryjane Hurter Dose: NO ASA / Instructions/ Last Dose :  NO

## 2020-03-18 ENCOUNTER — Other Ambulatory Visit (HOSPITAL_COMMUNITY)
Admission: RE | Admit: 2020-03-18 | Discharge: 2020-03-18 | Disposition: A | Discharge status: Home / Self Care | Payer: Medicare Other | Source: Ambulatory Visit | Attending: Urology | Admitting: Urology

## 2020-03-18 DIAGNOSIS — Z01812 Encounter for preprocedural laboratory examination: Secondary | ICD-10-CM | POA: Insufficient documentation

## 2020-03-18 DIAGNOSIS — Z20822 Contact with and (suspected) exposure to covid-19: Secondary | ICD-10-CM | POA: Insufficient documentation

## 2020-03-19 LAB — SARS CORONAVIRUS 2 (TAT 6-24 HRS): SARS Coronavirus 2: NEGATIVE

## 2020-03-20 NOTE — H&P (Signed)
CC: Incontinence, frequency, intermittent gross hematuria, dysuria   HPI: 74 year old female with a past medical hisotry of Paget's disease of the breast, underwent chemo and radiation, past smoker, and nephrolithiasis. She was last in our office in 2019 and saw Dr. Pilar Jarvis. She reports 1 year of intermittent dysuria, worsening frequency, intermittent gross hematuria and incontinence associated with urgency. She dos not feel her urinary stream is strong and reports that it also splits. She does not feel she empties well. She denies fevers and chills. She denies passage of stone material. She currently has lower leg edema, non pitting. She has been checked by cardiology for this. Urinalysis is clear and PVR is 248mls.   -03/02/20-patient with history of intermittent dysuria and gross hematuria with incontinence as above. Underwent CT renal protocol scan 02/17/2020 and showed full very small nonobstructing right lower pole 1 mm stone otherwise normal upper urinary tract. See report below. Here for cysto to assess bladder.  Cysto performed today shows small papillary lesions in the left floor the bladder suspicious for low-grade transitional cell tumor measuring 1-2 cm x 2 lesions. Functional capacity was approximately 250 cc and bonney/Marshalll test was negative with Valsalva. There is a stress mediated contraction causing leakage in the standing position.  CLINICAL DATA: 74 year old female with history of gross hematuria.   EXAM:  CT ABDOMEN AND PELVIS WITHOUT AND WITH CONTRAST   TECHNIQUE:  Multidetector CT imaging of the abdomen and pelvis was performed  following the standard protocol before and following the bolus  administration of intravenous contrast.   CONTRAST: 125 mL of Omnipaque 300.   COMPARISON: CT the abdomen and pelvis 11/18/2017.   FINDINGS:  Lower chest: Atherosclerotic calcifications in the descending  thoracic aorta, as well as the left main, left anterior descending  and  right coronary arteries.   Hepatobiliary: No suspicious cystic or solid hepatic lesions. No  intra or extrahepatic biliary ductal dilatation. Gallbladder is  normal in appearance.   Pancreas: No pancreatic mass. No pancreatic ductal dilatation. No  pancreatic or peripancreatic fluid collections or inflammatory  changes.   Spleen: Unremarkable.   Adrenals/Urinary Tract: 1 mm nonobstructive of the right kidney  calculus in the lower pole collecting system. No additional calculi  are noted within the left renal collecting system, along the course  of either ureter, or within the lumen of the urinary bladder. No  hydroureteronephrosis. Subcentimeter low-attenuation lesions in both  kidneys, too small to characterize, but statistically likely to  represent tiny cysts. No definite aggressive appearing renal  lesions. On postcontrast delayed images there are no definite  filling defects within the collecting system of either kidney, along  the course of either ureter, or within the lumen of the urinary  bladder to strongly suggest the presence of urothelial neoplasm at  this time. Urinary bladder is under distended, but otherwise  unremarkable in appearance. Bilateral adrenal glands are normal in  appearance.   Stomach/Bowel: Normal appearance of the stomach. No pathologic  dilatation of small bowel or colon. Numerous colonic diverticulae  are noted, particularly in the sigmoid colon and descending colon,  without surrounding inflammatory changes to suggest an acute  diverticulitis at this time. The appendix is not confidently  identified and may be surgically absent. Regardless, there are no  inflammatory changes noted adjacent to the cecum to suggest the  presence of an acute appendicitis at this time.   Vascular/Lymphatic: Aortic atherosclerosis, without evidence of  aneurysm or dissection in the abdominal or pelvic  vasculature. No  lymphadenopathy noted in the abdomen or pelvis.    Reproductive: Status post hysterectomy. Ovaries are not confidently  identified may be surgically absent or atrophic.   Other: No significant volume of ascites. No pneumoperitoneum.   Musculoskeletal: There are no aggressive appearing lytic or blastic  lesions noted in the visualized portions of the skeleton.   IMPRESSION:  1. 1 mm nonobstructive calculus in the lower pole collecting system  of the right kidney. No ureteral stones or findings of urinary tract  obstruction are noted at this time.  2. Colonic diverticulosis without evidence of acute diverticulitis  at this time.  3. Aortic atherosclerosis, in addition to left main and 2 vessel  coronary artery disease. Assessment for potential risk factor  modification, dietary therapy or pharmacologic therapy may be  warranted, if clinically indicated.  4. Additional incidental findings, as above.   CLINICAL DATA: 74 year old female with history of gross hematuria.   EXAM:  CT ABDOMEN AND PELVIS WITHOUT AND WITH CONTRAST   TECHNIQUE:  Multidetector CT imaging of the abdomen and pelvis was performed  following the standard protocol before and following the bolus  administration of intravenous contrast.   CONTRAST: 125 mL of Omnipaque 300.   COMPARISON: CT the abdomen and pelvis 11/18/2017.   FINDINGS:  Lower chest: Atherosclerotic calcifications in the descending  thoracic aorta, as well as the left main, left anterior descending  and right coronary arteries.   Hepatobiliary: No suspicious cystic or solid hepatic lesions. No  intra or extrahepatic biliary ductal dilatation. Gallbladder is  normal in appearance.   Pancreas: No pancreatic mass. No pancreatic ductal dilatation. No  pancreatic or peripancreatic fluid collections or inflammatory  changes.   Spleen: Unremarkable.   Adrenals/Urinary Tract: 1 mm nonobstructive of the right kidney  calculus in the lower pole collecting system. No additional calculi  are  noted within the left renal collecting system, along the course  of either ureter, or within the lumen of the urinary bladder. No  hydroureteronephrosis. Subcentimeter low-attenuation lesions in both  kidneys, too small to characterize, but statistically likely to  represent tiny cysts. No definite aggressive appearing renal  lesions. On postcontrast delayed images there are no definite  filling defects within the collecting system of either kidney, along  the course of either ureter, or within the lumen of the urinary  bladder to strongly suggest the presence of urothelial neoplasm at  this time. Urinary bladder is under distended, but otherwise  unremarkable in appearance. Bilateral adrenal glands are normal in  appearance.   Stomach/Bowel: Normal appearance of the stomach. No pathologic  dilatation of small bowel or colon. Numerous colonic diverticulae  are noted, particularly in the sigmoid colon and descending colon,  without surrounding inflammatory changes to suggest an acute  diverticulitis at this time. The appendix is not confidently  identified and may be surgically absent. Regardless, there are no  inflammatory changes noted adjacent to the cecum to suggest the  presence of an acute appendicitis at this time.   Vascular/Lymphatic: Aortic atherosclerosis, without evidence of  aneurysm or dissection in the abdominal or pelvic vasculature. No  lymphadenopathy noted in the abdomen or pelvis.   Reproductive: Status post hysterectomy. Ovaries are not confidently  identified may be surgically absent or atrophic.   Other: No significant volume of ascites. No pneumoperitoneum.   Musculoskeletal: There are no aggressive appearing lytic or blastic  lesions noted in the visualized portions of the skeleton.   IMPRESSION:  1. 1  mm nonobstructive calculus in the lower pole collecting system  of the right kidney. No ureteral stones or findings of urinary tract  obstruction are noted  at this time.  2. Colonic diverticulosis without evidence of acute diverticulitis  at this time.  3. Aortic atherosclerosis, in addition to left main and 2 vessel  coronary artery disease. Assessment for potential risk factor  modification, dietary therapy or pharmacologic therapy may be  warranted, if clinically indicated.  4. Additional incidental findings, as above.      ALLERGIES: CODEINE Statins Tamsulosin - Itching (Moderate to Severe)    MEDICATIONS: Tamsulosin Hcl 0.4 mg capsule 1 capsule PO Q HS  Align  Losartan Potassium  Meclizine Hcl  Potassium  Protonix  Tramadol Hcl     GU PSH: Hysterectomy Locm 300-399Mg /Ml Iodine,1Ml - 02/17/2020       PSH Notes: Right Cyst under arm removed  Right Mole removed off Nipple    NON-GU PSH: Breast Reconstruction Hernia Repair Mastectomy, Simple, Complete, Left Tonsillectomy     GU PMH: Gross hematuria - 02/17/2020 Dysuria - 02/08/2020 Incomplete bladder emptying - 02/08/2020 Splitting of urinary stream - 02/08/2020 Urinary Frequency - 02/08/2020 Weak Urinary Stream - 02/08/2020 Ureteral calculus - 2019    NON-GU PMH: Breast Cancer, History GERD Hypercholesterolemia Hypertension    FAMILY HISTORY: 2 daughters - Daughter 2 sons - Son   SOCIAL HISTORY: Marital Status: Widowed Preferred Language: English Current Smoking Status: Patient does not smoke anymore.   Tobacco Use Assessment Completed: Used Tobacco in last 30 days? Has never drank.  Drinks 1 caffeinated drink per day. Patient's occupation is/was Retired.    REVIEW OF SYSTEMS:    GU Review Female:   Patient denies frequent urination, hard to postpone urination, burning /pain with urination, get up at night to urinate, leakage of urine, stream starts and stops, trouble starting your stream, have to strain to urinate, and being pregnant.  Gastrointestinal (Upper):   Patient denies nausea, vomiting, and indigestion/ heartburn.  Gastrointestinal  (Lower):   Patient denies diarrhea and constipation.  Constitutional:   Patient denies fever, night sweats, weight loss, and fatigue.  Skin:   Patient denies skin rash/ lesion and itching.  Eyes:   Patient denies blurred vision and double vision.  Ears/ Nose/ Throat:   Patient denies sore throat and sinus problems.  Hematologic/Lymphatic:   Patient denies swollen glands and easy bruising.  Cardiovascular:   Patient denies leg swelling and chest pains.  Respiratory:   Patient denies cough and shortness of breath.  Endocrine:   Patient denies excessive thirst.  Musculoskeletal:   Patient denies back pain and joint pain.  Neurological:   Patient denies headaches and dizziness.  Psychologic:   Patient denies depression and anxiety.   VITAL SIGNS:      03/03/2020 10:54 AM  Weight 202 lb / 91.63 kg  Height 67.5 in / 171.45 cm  BP 151/73 mmHg  Heart Rate 80 /min  Temperature 98.7 F / 37.0 C  BMI 31.2 kg/m   GU PHYSICAL EXAMINATION:    Breast: Symmetrical. No tenderness, no nipple discharge, no skin changes. No mass.  Digital Rectal Exam: Normal sphincter tone. No rectal mass.  External Genitalia: No hirsutism, no rash, no scarring, no cyst, no erythematous lesion, no papular lesion, no blanched lesion, no warty lesion. No edema.  Urethral Meatus: Normal size. Normal position. No discharge.  Urethra: No tenderness, no mass, no scarring. No hypermobility. No leakage.  Bladder: Normal to palpation, no tenderness, no  mass, normal size.  Vagina: No atrophy, no stenosis. No rectocele. No cystocele. No enterocele.  Cervix: S/P Hysterectomy  Uterus: S/P Hysterectomy  Adnexa / Parametria: No tenderness. No adnexal mass. Normal left ovary. Normal right ovary.  Anus and Perineum: No hemorrhoids. No anal stenosis. No rectal fissure, no anal fissure. No edema, no dimple, no perineal tenderness, no anal tenderness.   MULTI-SYSTEM PHYSICAL EXAMINATION:    Constitutional: Well-nourished. No physical  deformities. Normally developed. Good grooming.  Neck: Neck symmetrical, not swollen. Normal tracheal position.  Respiratory: No labored breathing, no use of accessory muscles.   Cardiovascular: Normal temperature, normal extremity pulses, no swelling, no varicosities.  Lymphatic: No enlargement of neck, axillae, groin.  Skin: No paleness, no jaundice, no cyanosis. No lesion, no ulcer, no rash.  Neurologic / Psychiatric: Oriented to time, oriented to place, oriented to person. No depression, no anxiety, no agitation.  Gastrointestinal: No mass, no tenderness, no rigidity, non obese abdomen.  Eyes: Normal conjunctivae. Normal eyelids.  Ears, Nose, Mouth, and Throat: Left ear no scars, no lesions, no masses. Right ear no scars, no lesions, no masses. Nose no scars, no lesions, no masses. Normal hearing. Normal lips.  Musculoskeletal: Normal gait and station of head and neck.     PAST DATA REVIEW: None   PROCEDURES:         Flexible Cystoscopy - 52000  Risks, benefits, and some of the potential complications of the procedure were discussed at length with the patient including infection, bleeding, voiding discomfort, urinary retention, fever, chills, sepsis, and others. All questions were answered. Informed consent was obtained. Antibiotic prophylaxis was given. Sterile technique and intraurethral analgesia were used.  Meatus:  Normal size. Normal location. Normal condition.  Urethra:  No hypermobility. No leakage.  Ureteral Orifices:  Normal location. Normal size. Normal shape. Effluxed clear urine.  Bladder:  Cysto performed today shows small papillary lesions in the left floor the bladder suspicious for low-grade transitional cell tumor measuring 1-2 cm x 2 lesions. Functional capacity was approximately 250 cc and bonney/Marshalll test was negative with Valsalva. There is a stress mediated contraction causing leakage in the standing position.      The lower urinary tract was carefully  examined. The procedure was well-tolerated and without complications. Antibiotic instructions were given. Instructions were given to call the office immediately for bloody urine, difficulty urinating, urinary retention, painful or frequent urination, fever, chills, nausea, vomiting or other illness. The patient stated that she understood these instructions and would comply with them.         Urinalysis - 81003 Dipstick Dipstick Cont'd  Color: Yellow Bilirubin: Neg  Appearance: Clear Ketones: Neg  Specific Gravity: 1.025 Blood: Neg  pH: 6.0 Protein: Neg  Glucose: Neg Urobilinogen: 0.2    Nitrites: Neg    Leukocyte Esterase: Neg    ASSESSMENT:      ICD-10 Details  1 GU:   Mixed incontinence - N39.46 Chronic, Worsening  2   Bladder tumor/neoplasm - D41.4 Undiagnosed New Problem  3   Gross hematuria - V78.4 Acute, Complicated Injury   PLAN:           Document Letter(s):  Created for Patient: Clinical Summary         Notes:   Based on cystoscopic findings recommended cysto TURBT and retrogrades. This will be scheduled accordingly in the near future. Risks and benefits discussed as outlined below.  TURBT consent: I have discussed with the patient the risks, benefits of TURBT which include but  are not limited to: Bleeding, infection, damage to the bladder with potential perforation of the bladder, damage to surrounding organs, possible need for further procedures including open repair and catheterization, possibility of nonhealing area within the bladder, urgency, frequency which may be refractory to medications. I pointed out that in some occasions after resection of the bladder tumor, mitomycin-C chemotherapy may be instilled into the bladder. The risks associated with this therapy include but are not limited to: Refractory or new onset urgency, frequency, dysuria, infrequently severe systemic side effects secondary to mitomycin-C. After full discussion of the risks, benefits and alternatives,  the patient has consented to the above procedure and desires to proceed.  From incontinence standpoint she may benefit from trial of Myrbetriq and will see if this will help some of her urgency. Some of her urgency symptoms obviously could be caused by lesions seen on the inside the bladder so we may see what happens after treatment of these 1st.

## 2020-03-20 NOTE — Anesthesia Preprocedure Evaluation (Addendum)
Anesthesia Evaluation  Patient identified by MRN, date of birth, ID band Patient awake    Reviewed: Allergy & Precautions, NPO status , Patient's Chart, lab work & pertinent test results  Airway Mallampati: I  TM Distance: >3 FB Neck ROM: Full    Dental no notable dental hx. (+) Edentulous Upper, Partial Lower,    Pulmonary former smoker,    Pulmonary exam normal breath sounds clear to auscultation       Cardiovascular hypertension, Pt. on medications + CAD  Normal cardiovascular exam Rhythm:Regular Rate:Normal  11/01/19 Echo 1. Left ventricular ejection fraction, by estimation, is 60 to 65%. The  left ventricle has normal function. The left ventricle has no regional  wall motion abnormalities. There is mild concentric left ventricular  hypertrophy. Left ventricular diastolic  parameters are consistent with Grade I diastolic dysfunction (impaired  relaxation).    Neuro/Psych Depression  Neuromuscular disease    GI/Hepatic Neg liver ROS, hiatal hernia, GERD  Medicated and Controlled,  Endo/Other  negative endocrine ROS  Renal/GU K+ 3.8 Cr 0.80 Bladder dysfunction      Musculoskeletal  (+) Arthritis , Fibromyalgia -  Abdominal (+) + obese,   Peds  Hematology Hgb 14.6   Anesthesia Other Findings Hx of pagets disease toL  breast s/p  rad tx  bilat masectomy w reconstruction  Reproductive/Obstetrics                            Anesthesia Physical Anesthesia Plan  ASA: III  Anesthesia Plan: General   Post-op Pain Management:    Induction: Intravenous  PONV Risk Score and Plan: 4 or greater and Treatment may vary due to age or medical condition and Ondansetron  Airway Management Planned: LMA  Additional Equipment: None  Intra-op Plan:   Post-operative Plan:   Informed Consent: I have reviewed the patients History and Physical, chart, labs and discussed the procedure including the  risks, benefits and alternatives for the proposed anesthesia with the patient or authorized representative who has indicated his/her understanding and acceptance.     Dental advisory given  Plan Discussed with: CRNA and Anesthesiologist  Anesthesia Plan Comments:        Anesthesia Quick Evaluation

## 2020-03-21 ENCOUNTER — Encounter (HOSPITAL_BASED_OUTPATIENT_CLINIC_OR_DEPARTMENT_OTHER): Payer: Self-pay | Admitting: Urology

## 2020-03-21 ENCOUNTER — Ambulatory Visit (HOSPITAL_BASED_OUTPATIENT_CLINIC_OR_DEPARTMENT_OTHER): Payer: Medicare Other | Admitting: Anesthesiology

## 2020-03-21 ENCOUNTER — Encounter (HOSPITAL_BASED_OUTPATIENT_CLINIC_OR_DEPARTMENT_OTHER)
Admission: RE | Disposition: A | Discharge status: Home / Self Care | Payer: Self-pay | Source: Home / Self Care | Attending: Urology

## 2020-03-21 ENCOUNTER — Ambulatory Visit (HOSPITAL_BASED_OUTPATIENT_CLINIC_OR_DEPARTMENT_OTHER)
Admission: RE | Admit: 2020-03-21 | Discharge: 2020-03-21 | Disposition: A | Discharge status: Home / Self Care | Payer: Medicare Other | Attending: Urology | Admitting: Urology

## 2020-03-21 DIAGNOSIS — Z79899 Other long term (current) drug therapy: Secondary | ICD-10-CM | POA: Insufficient documentation

## 2020-03-21 DIAGNOSIS — D090 Carcinoma in situ of bladder: Secondary | ICD-10-CM | POA: Diagnosis not present

## 2020-03-21 DIAGNOSIS — Z885 Allergy status to narcotic agent status: Secondary | ICD-10-CM | POA: Diagnosis not present

## 2020-03-21 DIAGNOSIS — N3946 Mixed incontinence: Secondary | ICD-10-CM | POA: Diagnosis not present

## 2020-03-21 DIAGNOSIS — Z87442 Personal history of urinary calculi: Secondary | ICD-10-CM | POA: Insufficient documentation

## 2020-03-21 DIAGNOSIS — I2510 Atherosclerotic heart disease of native coronary artery without angina pectoris: Secondary | ICD-10-CM | POA: Diagnosis not present

## 2020-03-21 DIAGNOSIS — Z79891 Long term (current) use of opiate analgesic: Secondary | ICD-10-CM | POA: Insufficient documentation

## 2020-03-21 DIAGNOSIS — I10 Essential (primary) hypertension: Secondary | ICD-10-CM | POA: Diagnosis not present

## 2020-03-21 DIAGNOSIS — C676 Malignant neoplasm of ureteric orifice: Secondary | ICD-10-CM | POA: Diagnosis not present

## 2020-03-21 DIAGNOSIS — D494 Neoplasm of unspecified behavior of bladder: Secondary | ICD-10-CM | POA: Diagnosis not present

## 2020-03-21 DIAGNOSIS — R310 Gross hematuria: Secondary | ICD-10-CM | POA: Insufficient documentation

## 2020-03-21 DIAGNOSIS — C679 Malignant neoplasm of bladder, unspecified: Secondary | ICD-10-CM | POA: Diagnosis present

## 2020-03-21 DIAGNOSIS — Z87891 Personal history of nicotine dependence: Secondary | ICD-10-CM | POA: Insufficient documentation

## 2020-03-21 DIAGNOSIS — E785 Hyperlipidemia, unspecified: Secondary | ICD-10-CM | POA: Diagnosis not present

## 2020-03-21 DIAGNOSIS — Z888 Allergy status to other drugs, medicaments and biological substances status: Secondary | ICD-10-CM | POA: Diagnosis not present

## 2020-03-21 DIAGNOSIS — R600 Localized edema: Secondary | ICD-10-CM | POA: Diagnosis not present

## 2020-03-21 HISTORY — DX: Personal history of other diseases of the digestive system: Z87.19

## 2020-03-21 HISTORY — DX: Hematuria, unspecified: R31.9

## 2020-03-21 HISTORY — DX: Diaphragmatic hernia without obstruction or gangrene: K44.9

## 2020-03-21 HISTORY — DX: Diverticulosis of large intestine without perforation or abscess without bleeding: K57.30

## 2020-03-21 HISTORY — DX: Presence of spectacles and contact lenses: Z97.3

## 2020-03-21 HISTORY — DX: Neoplasm of unspecified behavior of bladder: D49.4

## 2020-03-21 HISTORY — PX: CYSTOSCOPY W/ RETROGRADES: SHX1426

## 2020-03-21 HISTORY — DX: Personal history of urinary calculi: Z87.442

## 2020-03-21 HISTORY — DX: Localized edema: R60.0

## 2020-03-21 HISTORY — PX: TRANSURETHRAL RESECTION OF BLADDER TUMOR: SHX2575

## 2020-03-21 HISTORY — DX: Unspecified symptoms and signs involving the genitourinary system: R39.9

## 2020-03-21 LAB — POCT I-STAT, CHEM 8
BUN: 13 mg/dL (ref 8–23)
Calcium, Ion: 1.41 mmol/L — ABNORMAL HIGH (ref 1.15–1.40)
Chloride: 102 mmol/L (ref 98–111)
Creatinine, Ser: 0.8 mg/dL (ref 0.44–1.00)
Glucose, Bld: 100 mg/dL — ABNORMAL HIGH (ref 70–99)
HCT: 43 % (ref 36.0–46.0)
Hemoglobin: 14.6 g/dL (ref 12.0–15.0)
Potassium: 3.8 mmol/L (ref 3.5–5.1)
Sodium: 144 mmol/L (ref 135–145)
TCO2: 31 mmol/L (ref 22–32)

## 2020-03-21 SURGERY — TURBT (TRANSURETHRAL RESECTION OF BLADDER TUMOR)
Anesthesia: General

## 2020-03-21 MED ORDER — ONDANSETRON HCL 4 MG/2ML IJ SOLN: 4.0000 mg | Freq: Once | INTRAMUSCULAR | Status: DC | PRN

## 2020-03-21 MED ORDER — ACETAMINOPHEN 10 MG/ML IV SOLN: 1000.0000 mg | Freq: Once | INTRAVENOUS | Status: DC | PRN

## 2020-03-21 MED ORDER — ONDANSETRON HCL 4 MG/2ML IJ SOLN
INTRAMUSCULAR | Status: AC
  Filled 2020-03-21: qty 2

## 2020-03-21 MED ORDER — ONDANSETRON HCL 4 MG/2ML IJ SOLN
INTRAMUSCULAR | Status: DC | PRN
  Administered 2020-03-21: 4 mg via INTRAVENOUS

## 2020-03-21 MED ORDER — FENTANYL CITRATE (PF) 100 MCG/2ML IJ SOLN
INTRAMUSCULAR | Status: DC | PRN
  Administered 2020-03-21 (×2): 50 ug via INTRAVENOUS

## 2020-03-21 MED ORDER — FENTANYL CITRATE (PF) 100 MCG/2ML IJ SOLN
INTRAMUSCULAR | Status: AC
  Filled 2020-03-21: qty 2

## 2020-03-21 MED ORDER — PROPOFOL 10 MG/ML IV BOLUS
INTRAVENOUS | Status: AC
  Filled 2020-03-21: qty 20

## 2020-03-21 MED ORDER — OXYCODONE HCL 5 MG PO TABS
ORAL_TABLET | ORAL | Status: AC
  Filled 2020-03-21: qty 1

## 2020-03-21 MED ORDER — CEFAZOLIN SODIUM-DEXTROSE 2-4 GM/100ML-% IV SOLN
2.0000 g | INTRAVENOUS | Status: AC
  Administered 2020-03-21: 2 g via INTRAVENOUS

## 2020-03-21 MED ORDER — CEFAZOLIN SODIUM-DEXTROSE 2-4 GM/100ML-% IV SOLN
INTRAVENOUS | Status: AC
  Filled 2020-03-21: qty 100

## 2020-03-21 MED ORDER — DEXAMETHASONE SODIUM PHOSPHATE 10 MG/ML IJ SOLN
INTRAMUSCULAR | Status: AC
  Filled 2020-03-21: qty 1

## 2020-03-21 MED ORDER — PROPOFOL 10 MG/ML IV BOLUS
INTRAVENOUS | Status: DC | PRN
  Administered 2020-03-21: 120 mg via INTRAVENOUS

## 2020-03-21 MED ORDER — OXYCODONE HCL 5 MG PO TABS
5.0000 mg | ORAL_TABLET | Freq: Once | ORAL | Status: AC
  Administered 2020-03-21: 5 mg via ORAL

## 2020-03-21 MED ORDER — STERILE WATER FOR IRRIGATION IR SOLN
Status: DC | PRN
  Administered 2020-03-21: 3000 mL

## 2020-03-21 MED ORDER — LIDOCAINE HCL (PF) 2 % IJ SOLN
INTRAMUSCULAR | Status: AC
  Filled 2020-03-21: qty 5

## 2020-03-21 MED ORDER — DEXAMETHASONE SODIUM PHOSPHATE 4 MG/ML IJ SOLN
INTRAMUSCULAR | Status: DC | PRN
  Administered 2020-03-21: 5 mg via INTRAVENOUS

## 2020-03-21 MED ORDER — BELLADONNA ALKALOIDS-OPIUM 16.2-60 MG RE SUPP
RECTAL | Status: AC
  Filled 2020-03-21: qty 1

## 2020-03-21 MED ORDER — LIDOCAINE HCL (CARDIAC) PF 100 MG/5ML IV SOSY
PREFILLED_SYRINGE | INTRAVENOUS | Status: DC | PRN
  Administered 2020-03-21: 100 mg via INTRAVENOUS

## 2020-03-21 MED ORDER — FENTANYL CITRATE (PF) 100 MCG/2ML IJ SOLN: 25.0000 ug | INTRAMUSCULAR | Status: DC | PRN

## 2020-03-21 MED ORDER — IOHEXOL 300 MG/ML  SOLN
INTRAMUSCULAR | Status: DC | PRN
  Administered 2020-03-21: 10 mL via URETHRAL

## 2020-03-21 MED ORDER — LACTATED RINGERS IV SOLN: INTRAVENOUS | Status: DC

## 2020-03-21 SURGICAL SUPPLY — 35 items
BAG DRAIN URO-CYSTO SKYTR STRL (DRAIN) ×3 IMPLANT
BAG DRN RND TRDRP ANRFLXCHMBR (UROLOGICAL SUPPLIES)
BAG DRN UROCATH (DRAIN) ×2
BAG URINE DRAIN 2000ML AR STRL (UROLOGICAL SUPPLIES) IMPLANT
BAG URINE LEG 500ML (DRAIN) IMPLANT
BULB IRRIG PATHFIND (MISCELLANEOUS) IMPLANT
CATH FOLEY 2WAY SLVR  5CC 20FR (CATHETERS)
CATH FOLEY 2WAY SLVR  5CC 22FR (CATHETERS)
CATH FOLEY 2WAY SLVR 5CC 20FR (CATHETERS) IMPLANT
CATH FOLEY 2WAY SLVR 5CC 22FR (CATHETERS) IMPLANT
CATH URET 5FR 28IN CONE TIP (BALLOONS)
CATH URET 5FR 28IN OPEN ENDED (CATHETERS) IMPLANT
CATH URET 5FR 70CM CONE TIP (BALLOONS) IMPLANT
CLOTH BEACON ORANGE TIMEOUT ST (SAFETY) ×3 IMPLANT
ELECT REM PT RETURN 9FT ADLT (ELECTROSURGICAL) ×3
ELECTRODE REM PT RTRN 9FT ADLT (ELECTROSURGICAL) ×2 IMPLANT
EVACUATOR MICROVAS BLADDER (UROLOGICAL SUPPLIES) IMPLANT
FIBER LASER FLEXIVA 365 (UROLOGICAL SUPPLIES) IMPLANT
GLOVE BIO SURGEON STRL SZ7.5 (GLOVE) ×3 IMPLANT
GOWN STRL REUS W/ TWL XL LVL3 (GOWN DISPOSABLE) ×2 IMPLANT
GOWN STRL REUS W/TWL XL LVL3 (GOWN DISPOSABLE) ×3
GUIDEWIRE ANG ZIPWIRE 038X150 (WIRE) IMPLANT
GUIDEWIRE STR DUAL SENSOR (WIRE) IMPLANT
HOLDER FOLEY CATH W/STRAP (MISCELLANEOUS) IMPLANT
KIT TURNOVER CYSTO (KITS) ×3 IMPLANT
LOOP MONOPOLAR YLW (ELECTROSURGICAL) IMPLANT
MANIFOLD NEPTUNE II (INSTRUMENTS) IMPLANT
PACK CYSTO (CUSTOM PROCEDURE TRAY) ×3 IMPLANT
PLUG CATH AND CAP STER (CATHETERS) IMPLANT
SYR 20ML LL LF (SYRINGE) ×3 IMPLANT
SYR TOOMEY IRRIG 70ML (MISCELLANEOUS)
SYRINGE TOOMEY IRRIG 70ML (MISCELLANEOUS) IMPLANT
TRACTIP FLEXIVA PULS ID 200XHI (Laser) IMPLANT
TRACTIP FLEXIVA PULSE ID 200 (Laser)
TUBE CONNECTING 12X1/4 (SUCTIONS) IMPLANT

## 2020-03-21 NOTE — Op Note (Signed)
Operative report  Preop diagnosis: Bladder cancer Postop diagnosis: Bladder cancer Procedure: Cystoscopy, bilateral retrogrades with intraoperative interpretation, transurethral section of bladder tumor (medium size) Surgeon: Milford Cage Anesthesia: General Estimated blood loss: Minimal Operative findings: 2 small papillary exophytic tumors left floor the bladder superior lateral to the left ureteral orifice.  Both tumors completely removed and base fulgurated.  Bilateral retrogrades were normal Operative note: After obtaining informed consent for the patient she was taken to the major cystoscopy suite placed under general anesthesia.  She was placed in the dorsolithotomy position and her genitalia were prepped and draped in usual sterile fashion.  Proper pause and timeout was performed prior to beginning the procedure.  83 Fransico was then advanced into the bladder without difficulty.  Bladder was thoroughly spread with 30 and 70 degree lenses.  Patient was noted to have 2 small exophytic bladder tumors superior lateral to the left ureteral orifice.  Remainder of the bladder appeared grossly normal there was clear reflux from both ureteral orifice ease.  Bilateral retrogrades were performed utilizing a 5 French open tip catheter.  The ureteral orifice ease were cannulated and retrograde were performed.  This revealed normal filling of both upper tracts without evidence of filling defect or hydronephrosis.  Both upper tracts emptied out promptly upon removal of the retrograde catheter.  Attention was then directed towards removal of the bladder tumors.  In order to preserve pathologic integrity it was felt that these lesions would be removed utilizing rigid biopsy forceps rather than loop resection.  Both of the lesions were removed utilizing rigid biopsy forceps with the biopsies and complete removal of the lesions.  The base of these areas were then fulgurated with Bugbee electrode with good hemostasis  noted.  Total area was approximately 2 to 3 cm in size at the fulgurated base.  The bladder was emptied and the area was reinspected with good hemostasis noted.  The scope was removed procedure terminated.  She was awakened from anesthesia and taken back to the recovery room in stable condition.  No immediate complication from the procedure.

## 2020-03-21 NOTE — Anesthesia Postprocedure Evaluation (Signed)
Anesthesia Post Note  Patient: [name]  Procedure(s) Performed: TRANSURETHRAL RESECTION OF BLADDER TUMOR (TURBT) (N/A ) CYSTOSCOPY WITH RETROGRADE PYELOGRAM (Bilateral )     Patient location during evaluation: PACU Anesthesia Type: General Level of consciousness: awake and alert Pain management: pain level controlled Vital Signs Assessment: post-procedure vital signs reviewed and stable Respiratory status: spontaneous breathing, nonlabored ventilation, respiratory function stable and patient connected to nasal cannula oxygen Cardiovascular status: blood pressure returned to baseline and stable Postop Assessment: no apparent nausea or vomiting Anesthetic complications: no   No complications documented.  Last Vitals:  Vitals:   03/21/20 1145 03/21/20 1200  BP: 131/70 92/77  Pulse: (!) 57 (!) 57  Resp: 15 15  Temp:    SpO2: 100% 98%    Last Pain:  Vitals:   03/21/20 1145  TempSrc:   PainSc: 0-No pain                 Barnet Glasgow

## 2020-03-21 NOTE — Anesthesia Procedure Notes (Signed)
Procedure Name: LMA Insertion Date/Time: 03/21/2020 10:46 AM Performed by: Bufford Spikes, CRNA Pre-anesthesia Checklist: Patient identified, Emergency Drugs available, Suction available and Patient being monitored Patient Re-evaluated:Patient Re-evaluated prior to induction Oxygen Delivery Method: Circle system utilized Preoxygenation: Pre-oxygenation with 100% oxygen Induction Type: IV induction Ventilation: Mask ventilation without difficulty LMA: LMA inserted LMA Size: 4.0 Number of attempts: 1 Airway Equipment and Method: Bite block Placement Confirmation: positive ETCO2 Tube secured with: Tape Dental Injury: Teeth and Oropharynx as per pre-operative assessment

## 2020-03-21 NOTE — Discharge Instructions (Signed)
  Post Anesthesia Home Care Instructions  Activity: Get plenty of rest for the remainder of the day. A responsible individual must stay with you for 24 hours following the procedure.  For the next 24 hours, DO NOT: -Drive a car -Paediatric nurse -Drink alcoholic beverages -Take any medication unless instructed by your physician -Make any legal decisions or sign important papers.  Meals: Start with liquid foods such as gelatin or soup. Progress to regular foods as tolerated. Avoid greasy, spicy, heavy foods. If nausea and/or vomiting occur, drink only clear liquids until the nausea and/or vomiting subsides. Call your physician if vomiting continues.  Special Instructions/Symptoms: Your throat may feel dry or sore from the anesthesia or the breathing tube placed in your throat during surgery. If this causes discomfort, gargle with warm salt water. The discomfort should disappear within 24 hours.  CYSTOSCOPY HOME CARE INSTRUCTIONS  Activity: Rest for the remainder of the day.  Do not drive or operate equipment today.  You may resume normal activities in one to two days as instructed by your physician.   Meals: Drink plenty of liquids and eat light foods such as gelatin or soup this evening.  You may return to a normal meal plan tomorrow.  Return to Work: You may return to work in one to two days or as instructed by your physician.  Special Instructions / Symptoms: Call your physician if any of these symptoms occur:   -persistent or heavy bleeding  -bleeding which continues after first few urination  -large blood clots that are difficult to pass  -urine stream diminishes or stops completely  -fever equal to or higher than 101 degrees Farenheit.  -cloudy urine with a strong, foul odor  -severe pain  Females should always wipe from front to back after elimination.  You may feel some burning pain when you urinate.  This should disappear with time.  Applying moist heat to the lower  abdomen  may help relieve the pain.   Urine may have blood in it for a few days.

## 2020-03-21 NOTE — Transfer of Care (Signed)
Immediate Anesthesia Transfer of Care Note  Patient: Sendy M Bagsby  Procedure(s) Performed: TRANSURETHRAL RESECTION OF BLADDER TUMOR (TURBT) (N/A ) CYSTOSCOPY WITH RETROGRADE PYELOGRAM (Bilateral )  Patient Location: PACU  Anesthesia Type:General  Level of Consciousness: awake, alert , oriented and patient cooperative  Airway & Oxygen Therapy: Patient Spontanous Breathing and Patient connected to nasal cannula oxygen  Post-op Assessment: Report given to RN and Post -op Vital signs reviewed and stable  Post vital signs: Reviewed and stable  Last Vitals:  Vitals Value Taken Time  BP 134/77 03/21/20 1120  Temp 36.6 C 03/21/20 1120  Pulse 119 03/21/20 1123  Resp 13 03/21/20 1123  SpO2 100 % 03/21/20 1123  Vitals shown include unvalidated device data.  Last Pain:  Vitals:   03/21/20 1120  TempSrc:   PainSc: 0-No pain      Patients Stated Pain Goal: 4 (77/41/42 3953)  Complications: No complications documented.

## 2020-03-21 NOTE — Interval H&P Note (Signed)
History and Physical Interval Note:  03/21/2020 9:24 AM  [name]  has presented today for surgery, with the diagnosis of BLADDER TUMOR.  The various methods of treatment have been discussed with the patient and family. After consideration of risks, benefits and other options for treatment, the patient has consented to  Procedure(s) with comments: TRANSURETHRAL RESECTION OF BLADDER TUMOR (TURBT) (N/A) - 30 MINS CYSTOSCOPY WITH RETROGRADE PYELOGRAM (Bilateral) as a surgical intervention.  The patient's history has been reviewed, patient examined, no change in status, stable for surgery.  I have reviewed the patient's chart and labs.  Questions were answered to the patient's satisfaction.     Remi Haggard

## 2020-03-22 LAB — SURGICAL PATHOLOGY

## 2020-03-23 ENCOUNTER — Encounter (HOSPITAL_BASED_OUTPATIENT_CLINIC_OR_DEPARTMENT_OTHER): Payer: Self-pay | Admitting: Urology

## 2020-03-29 DIAGNOSIS — C678 Malignant neoplasm of overlapping sites of bladder: Secondary | ICD-10-CM | POA: Diagnosis not present

## 2020-04-09 ENCOUNTER — Other Ambulatory Visit: Payer: Self-pay | Admitting: Internal Medicine

## 2020-04-22 DIAGNOSIS — Z8616 Personal history of COVID-19: Secondary | ICD-10-CM

## 2020-04-22 HISTORY — DX: Personal history of COVID-19: Z86.16

## 2020-04-27 ENCOUNTER — Encounter: Payer: Medicare Other | Admitting: Obstetrics and Gynecology

## 2020-05-04 DIAGNOSIS — Z1152 Encounter for screening for COVID-19: Secondary | ICD-10-CM | POA: Diagnosis not present

## 2020-05-05 ENCOUNTER — Other Ambulatory Visit: Payer: Self-pay

## 2020-05-05 ENCOUNTER — Ambulatory Visit (INDEPENDENT_AMBULATORY_CARE_PROVIDER_SITE_OTHER): Payer: Medicare Other | Admitting: Obstetrics and Gynecology

## 2020-05-05 ENCOUNTER — Encounter: Payer: Self-pay | Admitting: Obstetrics and Gynecology

## 2020-05-05 VITALS — BP 130/80 | Ht 67.0 in | Wt 211.0 lb

## 2020-05-05 DIAGNOSIS — Z853 Personal history of malignant neoplasm of breast: Secondary | ICD-10-CM

## 2020-05-05 DIAGNOSIS — Z01419 Encounter for gynecological examination (general) (routine) without abnormal findings: Secondary | ICD-10-CM

## 2020-05-05 NOTE — Progress Notes (Signed)
[place] [birth year] [id number]  SUBJECTIVE:  75 y.o. G35P5 female here for a breast and pelvic exam. She has no gynecologic concerns.  Recently had resection of a bladder tumor with urology 02/2020.  Current Outpatient Medications  Medication Sig Dispense Refill  . acetaminophen (TYLENOL) 325 MG tablet Take 650 mg by mouth every 6 (six) hours as needed.    [name] losartan (COZAAR) 100 MG tablet Take 1 tablet (100 mg total) by mouth daily. (Patient taking differently: Take 100 mg by mouth at bedtime.) 90 tablet 1  . Magnesium 400 MG TABS Take 400 mg by mouth daily. 90 tablet 3  . meclizine (ANTIVERT) 25 MG tablet Take 25 mg by mouth 3 (three) times daily as needed for dizziness.    . nabumetone (RELAFEN) 500 MG tablet TAKE 1 TABLET(500 MG) BY MOUTH TWICE DAILY AS NEEDED (Patient taking differently: Take 500 mg by mouth 2 (two) times daily as needed.) 180 tablet 0  . pantoprazole (PROTONIX) 40 MG tablet TAKE 1 TABLET(40 MG) BY MOUTH DAILY 30 tablet 3  . potassium chloride SA (KLOR-CON) 20 MEQ tablet Take 1 tablet (20 mEq total) by mouth 3 (three) times daily. 270 tablet 0  . Probiotic Product (ALIGN PO) Take 1 tablet by mouth daily.     [name] torsemide (DEMADEX) 20 MG tablet Take 1 tablet (20 mg total) by mouth daily. (Patient taking differently: Take 20 mg by mouth at bedtime.) 90 tablet 1  . traMADol (ULTRAM) 50 MG tablet Take 1 tablet (50 mg total) by mouth every 6 (six) hours as needed. 65 tablet 1   No current facility-administered medications for this visit.   Allergies: Hydrochlorothiazide, Flomax [tamsulosin], Codeine, Statins, and Zetia [ezetimibe]  No LMP recorded. Patient has had a hysterectomy.  Past medical history,surgical history, problem list, medications, allergies, family history and social history were all reviewed and documented as reviewed in the EPIC chart.  GYN ROS: no abnormal bleeding, pelvic pain or discharge, no breast pain or new or enlarging lumps on self exam.   No dysuria, frequency, burning, pain with urination, cloudy/malodorous urine.   OBJECTIVE:  BP 130/80 (BP Location: Right Arm, Patient Position: Sitting, Cuff Size: Normal)   Ht 5\' 7"  (1.702 m)   Wt 211 lb (95.7 kg)   BMI 33.05 kg/m  The patient appears well, alert, oriented, in no distress.  BREAST EXAM: left breast with prior mastectomy and reconstruction, otherwise breasts appear normal, no suspicious masses, no skin or nipple changes or axillary nodes,   PELVIC EXAM: VULVA: normal appearing vulva with atrophic change, no masses, tenderness or lesions, VAGINA: normal appearing vagina with atrophic change, normal color and discharge, no lesions, CERVIX: surgically absent, UTERUS: surgically absent, vaginal cuff normal, ADNEXA: no masses, nontender  Chaperone: Wandra Scot Bonham present during the examination  ASSESSMENT:  75 y.o. G5P5 here for a breast and pelvic exam  PLAN:   1. Postmenopausal. Prior TVH with anterior/posterior repair for leiomyoma/menorrhagia.  No significant menopausal symptoms. 2. Pap smear 03/2018.  No significant history of abnormal Pap smears.  Reviewed current screening guidelines, if she desires to continue screening she would be due at her next annual exam. 3. History of left breast cancer.  Prior mastectomy and reconstruction.  Mammogram 12/2019.  Normal breast exam today-NED.  Continue with annual mammograms. 4. Colonoscopy 2019.  She will follow up at the interval recommended by her GI specialist.  5. DEXA 04/2018 normal.  Next DEXA recommended 5-year interval. 6. Health maintenance.  No  labs today as she normally has these completed elsewhere.  Says she will be following up with urology within the next few weeks regarding the bladder cancer diagnosis.  The patient is aware that I will only be at this practice until early March 2022 so she knows to make sure she requests follow-up on any results if testing is not done while I am still at the practice.  Return  annually or sooner, prn.  Joseph Pierini MD 05/05/20

## 2020-06-28 DIAGNOSIS — C678 Malignant neoplasm of overlapping sites of bladder: Secondary | ICD-10-CM | POA: Diagnosis not present

## 2020-07-10 ENCOUNTER — Ambulatory Visit: Payer: Medicare Other

## 2020-07-17 ENCOUNTER — Other Ambulatory Visit: Payer: Self-pay | Admitting: Internal Medicine

## 2020-07-17 ENCOUNTER — Telehealth: Payer: Self-pay | Admitting: Internal Medicine

## 2020-07-17 DIAGNOSIS — I10 Essential (primary) hypertension: Secondary | ICD-10-CM

## 2020-07-17 DIAGNOSIS — K219 Gastro-esophageal reflux disease without esophagitis: Secondary | ICD-10-CM

## 2020-07-17 DIAGNOSIS — E876 Hypokalemia: Secondary | ICD-10-CM

## 2020-07-17 DIAGNOSIS — M5135 Other intervertebral disc degeneration, thoracolumbar region: Secondary | ICD-10-CM

## 2020-07-17 MED ORDER — LOSARTAN POTASSIUM 100 MG PO TABS: 100.0000 mg | ORAL_TABLET | Freq: Every day | ORAL | 1 refills | Status: DC

## 2020-07-17 MED ORDER — PANTOPRAZOLE SODIUM 40 MG PO TBEC: 40.0000 mg | DELAYED_RELEASE_TABLET | Freq: Every day | ORAL | 1 refills | Status: DC

## 2020-07-17 MED ORDER — POTASSIUM CHLORIDE CRYS ER 20 MEQ PO TBCR: 20.0000 meq | EXTENDED_RELEASE_TABLET | Freq: Three times a day (TID) | ORAL | 1 refills | Status: DC

## 2020-07-17 NOTE — Telephone Encounter (Signed)
Patient requesting refill for potassium chloride SA (KLOR-CON) 20 MEQ tablet pantoprazole (PROTONIX) 40 MG tablet losartan (COZAAR) 100 MG tablet   Pharmacy - [facility]

## 2020-07-19 MED ORDER — PANTOPRAZOLE SODIUM 40 MG PO TBEC: 40.0000 mg | DELAYED_RELEASE_TABLET | Freq: Every day | ORAL | 1 refills | Status: DC

## 2020-07-19 MED ORDER — POTASSIUM CHLORIDE CRYS ER 20 MEQ PO TBCR: 20.0000 meq | EXTENDED_RELEASE_TABLET | Freq: Three times a day (TID) | ORAL | 1 refills | Status: DC

## 2020-07-19 MED ORDER — LOSARTAN POTASSIUM 100 MG PO TABS: 100.0000 mg | ORAL_TABLET | Freq: Every day | ORAL | 1 refills | Status: DC

## 2020-07-19 NOTE — Telephone Encounter (Signed)
Rx's has been corrected.

## 2020-07-19 NOTE — Telephone Encounter (Signed)
Please call patient, Please send medication to [facility]

## 2020-07-19 NOTE — Addendum Note (Signed)
Addended by: Hinda Kehr on: 07/19/2020 03:38 PM   Modules accepted: Orders

## 2020-07-24 DIAGNOSIS — Z23 Encounter for immunization: Secondary | ICD-10-CM | POA: Diagnosis not present

## 2020-08-11 ENCOUNTER — Other Ambulatory Visit: Payer: Self-pay | Admitting: Internal Medicine

## 2020-08-11 ENCOUNTER — Telehealth: Payer: Self-pay | Admitting: Internal Medicine

## 2020-08-11 DIAGNOSIS — I10 Essential (primary) hypertension: Secondary | ICD-10-CM

## 2020-08-11 DIAGNOSIS — E876 Hypokalemia: Secondary | ICD-10-CM

## 2020-08-11 MED ORDER — POTASSIUM CHLORIDE CRYS ER 20 MEQ PO TBCR: 20.0000 meq | EXTENDED_RELEASE_TABLET | Freq: Three times a day (TID) | ORAL | 1 refills | Status: DC

## 2020-08-11 NOTE — Telephone Encounter (Signed)
[facility] called and is requesting a call back in regards to a clarification to potassium chloride SA (KLOR-CON) 20 MEQ tablet.    Ref #- [id number]

## 2020-08-15 ENCOUNTER — Other Ambulatory Visit: Payer: Self-pay | Admitting: Internal Medicine

## 2020-08-15 DIAGNOSIS — E876 Hypokalemia: Secondary | ICD-10-CM

## 2020-08-15 DIAGNOSIS — I10 Essential (primary) hypertension: Secondary | ICD-10-CM

## 2020-08-18 ENCOUNTER — Other Ambulatory Visit: Payer: Self-pay | Admitting: Internal Medicine

## 2020-08-18 DIAGNOSIS — K219 Gastro-esophageal reflux disease without esophagitis: Secondary | ICD-10-CM

## 2020-08-18 DIAGNOSIS — I10 Essential (primary) hypertension: Secondary | ICD-10-CM

## 2020-08-18 MED ORDER — LOSARTAN POTASSIUM 100 MG PO TABS: 100.0000 mg | ORAL_TABLET | Freq: Every day | ORAL | 1 refills | Status: DC

## 2020-08-18 MED ORDER — PANTOPRAZOLE SODIUM 40 MG PO TBEC: 40.0000 mg | DELAYED_RELEASE_TABLET | Freq: Every day | ORAL | 1 refills | Status: DC

## 2020-08-21 ENCOUNTER — Ambulatory Visit (INDEPENDENT_AMBULATORY_CARE_PROVIDER_SITE_OTHER): Payer: Medicare Other | Admitting: Family Medicine

## 2020-08-21 ENCOUNTER — Encounter: Payer: Self-pay | Admitting: Family Medicine

## 2020-08-21 ENCOUNTER — Ambulatory Visit (INDEPENDENT_AMBULATORY_CARE_PROVIDER_SITE_OTHER): Payer: Medicare Other

## 2020-08-21 ENCOUNTER — Other Ambulatory Visit: Payer: Self-pay

## 2020-08-21 VITALS — BP 124/70 | HR 89 | Resp 16 | Ht 67.0 in | Wt 205.0 lb

## 2020-08-21 DIAGNOSIS — W19XXXA Unspecified fall, initial encounter: Secondary | ICD-10-CM

## 2020-08-21 DIAGNOSIS — E782 Mixed hyperlipidemia: Secondary | ICD-10-CM

## 2020-08-21 DIAGNOSIS — E876 Hypokalemia: Secondary | ICD-10-CM

## 2020-08-21 DIAGNOSIS — M25552 Pain in left hip: Secondary | ICD-10-CM

## 2020-08-21 DIAGNOSIS — I872 Venous insufficiency (chronic) (peripheral): Secondary | ICD-10-CM | POA: Diagnosis not present

## 2020-08-21 DIAGNOSIS — I10 Essential (primary) hypertension: Secondary | ICD-10-CM | POA: Diagnosis not present

## 2020-08-21 DIAGNOSIS — R42 Dizziness and giddiness: Secondary | ICD-10-CM

## 2020-08-21 DIAGNOSIS — C678 Malignant neoplasm of overlapping sites of bladder: Secondary | ICD-10-CM | POA: Insufficient documentation

## 2020-08-21 NOTE — Progress Notes (Signed)
HPI: [name] is a 75 y.o. female, who is here today to establish care.  Former PCP: Dr Ronnald Ramp. Last preventive routine visit: Gyn annual visit on 05/05/20  Chronic medical problems: Venous insufficiency LE, CAD,fibromyalgia,DDD,HFpEF, HLD,HTN,GERD,and depression among some.  She sees her cardiologist once per year.  HTN: Dx'ed many years ago, late 1990's. She is on Losartan 100 mg daily HypoK+ on KLOR 20 meq daily. Negative for severe/frequent headache, visual changes, chest pain, dyspnea,or focal weakness.  HLD:She is not on pharmacologic treatment. She has not tolerated statins well. In 2016 she tried Zetia.  Lab Results  Component Value Date   CHOL 304 (H) 01/11/2019   HDL 62.50 01/11/2019   LDLCALC 176 (H) 01/02/2017   LDLDIRECT 209.0 01/11/2019   TRIG 238.0 (H) 01/11/2019   CHOLHDL 5 01/11/2019   She follows with urologist q 3 months for bladder cancer. S/P transurethral resection on 03/21/20. She sees Dr Milford Cage. Breast cancer,Paget's disease, 21 years ago, left. Per pt report s/p left mastectomy, radiation, and chemo.  Follows with gyn annually.  Hx of bursitis, left hip, pain is "killing me." She used to see rheumatologist years ago for fibromyalgia. Pain is getting worse for the past few months.  Negative for numbness , tingling,or back pain. Pain is constant, exacerbated by movement. She is not taking medication for pain.  She fell in 06/2020, feeling unbalance for the past 6 months, "staggering" She was walking on uneven pavements,twisted right ankle and fell.  Dx'ed with vertigo 8-9 months. Head "swimming." She has at least once per week, last 4-5 min at the time. Alleviated by lying/seating down.  Occasionally she has episodes in bed. Meclizine helps.  Brain MRI in 06/2018 because dizziness and vomiting: No acute finding. No cause of the presenting symptoms is identified. Normal for age, with only mild chronic appearing small  vessel ischemic change of the cerebral hemispheric white matter.  LE edema,worse at the end of the day. This is a chronic problem. No erythema. Negative for orthopnea and PND. She is on Torsemide 20 mg daily.  Review of Systems  Constitutional: Positive for fatigue. Negative for activity change, appetite change and fever.  HENT: Negative for mouth sores, nosebleeds and sore throat.   Respiratory: Negative for cough and wheezing.   Gastrointestinal: Negative for abdominal pain, nausea and vomiting.       Negative for changes in bowel habits.  Genitourinary: Negative for decreased urine volume and hematuria.  Musculoskeletal: Positive for gait problem and myalgias.  Skin: Negative for rash.  Neurological: Negative for syncope, facial asymmetry and weakness.  Psychiatric/Behavioral: Negative for confusion. The patient is nervous/anxious.   Rest see pertinent positives and negatives per HPI.  Current Outpatient Medications on File Prior to Visit  Medication Sig Dispense Refill  . acetaminophen (TYLENOL) 325 MG tablet Take 650 mg by mouth every 6 (six) hours as needed.    [name] BIOTIN PO Take 1 tablet by mouth daily.    [name] CALCIUM PO Take by mouth.    . cetirizine (ZYRTEC) 10 MG tablet Take 10 mg by mouth daily.    . Cholecalciferol (VITAMIN D3 PO) Take by mouth.    . cyanocobalamin 1000 MCG tablet Take 1,000 mcg by mouth daily.    [name] losartan (COZAAR) 100 MG tablet Take 1 tablet (100 mg total) by mouth at bedtime. 90 tablet 1  . Magnesium 400 MG TABS Take 400 mg by mouth daily. 90 tablet 3  . meclizine (ANTIVERT) 25 MG  tablet Take 25 mg by mouth 3 (three) times daily as needed for dizziness.    . nabumetone (RELAFEN) 500 MG tablet TAKE 1 TABLET(500 MG) BY MOUTH TWICE DAILY AS NEEDED (Patient taking differently: Take 500 mg by mouth 2 (two) times daily as needed.) 180 tablet 0  . pantoprazole (PROTONIX) 40 MG tablet Take 1 tablet (40 mg total) by mouth daily. 90 tablet 1  . potassium chloride  SA (KLOR-CON) 20 MEQ tablet TAKE 1 TABLET(20 MEQ) BY MOUTH THREE TIMES DAILY 270 tablet 1  . Probiotic Product (ALIGN PO) Take 1 tablet by mouth daily.     [name] torsemide (DEMADEX) 20 MG tablet Take 1 tablet (20 mg total) by mouth daily. (Patient taking differently: Take 20 mg by mouth at bedtime.) 90 tablet 1   No current facility-administered medications on file prior to visit.   Past Medical History:  Diagnosis Date  . Bilateral lower extremity edema    per pt left ankle greater than right  . Bladder tumor   . CAD (coronary artery disease)    cardiac cath 01/ 2005 by dr Terrence Dupont,  non-obstructive cad;  nuclear study 07-31-2016  normal with no evidence ischemia,nuclear ef 52%  . Diverticulosis of colon   . Fibromyalgia   . GERD (gastroesophageal reflux disease)   . Hematuria   . Hemorrhoids   . Hiatal hernia   . History of cancer chemotherapy 2000   left breast cancer  . History of diverticulitis of colon   . History of gastritis   . History of kidney stones   . History of left breast cancer 2000   dx 2000 and s/p left mastectomy w/ node dissection;  completed chemo and radiation therapy same year 2000 and completed anti-estrogen 2005;  per pt released from oncologist 2006 and no recurrence  . Hyperlipidemia   . Hypertension    followed by pcp  . Lower urinary tract symptoms (LUTS)    freq/ urg/ nocturia  . Personal history of radiation therapy 2000   left breast cancer  . Wears glasses    Allergies  Allergen Reactions  . Hydrochlorothiazide Other (See Comments)    High Ca++  . Flomax [Tamsulosin] Itching  . Codeine Itching  . Statins Other (See Comments)    Muscle aches  . Zetia [Ezetimibe] Other (See Comments)    Muscle aches    Family History  Problem Relation Age of Onset  . Alzheimer's disease Mother   . Breast cancer Sister 67  . Colon polyps Sister   . Cancer Son        prostate  . Anesthesia problems Neg Hx     Social History   Socioeconomic History   . Marital status: Widowed    Spouse name: Not on file  . Number of children: Not on file  . Years of education: Not on file  . Highest education level: Not on file  Occupational History  . Not on file  Tobacco Use  . Smoking status: Former Smoker    Years: 15.00    Types: Cigarettes    Quit date: 09/20/1998    Years since quitting: 21.9  . Smokeless tobacco: Never Used  Vaping Use  . Vaping Use: Never used  Substance and Sexual Activity  . Alcohol use: Yes    Alcohol/week: 0.0 standard drinks    Comment: Rare  . Drug use: Never  . Sexual activity: Not Currently    Birth control/protection: Surgical, Post-menopausal    Comment: 1st intercourse  75 yo-Fewer than 5 partners  Other Topics Concern  . Not on file  Social History Narrative   Widowed- husband died of Alzheimers in 2013/02/15.   Social Determinants of Health   Financial Resource Strain: Not on file  Food Insecurity: Not on file  Transportation Needs: Not on file  Physical Activity: Not on file  Stress: Not on file  Social Connections: Not on file   Vitals:   08/21/20 1529  BP: 124/70  Pulse: 89  Resp: 16  SpO2: 97%   Body mass index is 32.11 kg/m.  Physical Exam Vitals and nursing note reviewed.  Constitutional:      General: She is not in acute distress.    Appearance: She is well-developed.  HENT:     Head: Normocephalic and atraumatic.  Eyes:     Conjunctiva/sclera: Conjunctivae normal.     Pupils: Pupils are equal, round, and reactive to light.  Cardiovascular:     Rate and Rhythm: Normal rate and regular rhythm.     Pulses:          Dorsalis pedis pulses are 2+ on the right side and 2+ on the left side.     Heart sounds: No murmur heard.     Comments: Peri ankle non pitting ,bilateral. Varicose veins, LE, bilateral. Pulmonary:     Effort: Pulmonary effort is normal. No respiratory distress.     Breath sounds: Normal breath sounds.  Abdominal:     Palpations: Abdomen is soft. There is no  hepatomegaly or mass.     Tenderness: There is no abdominal tenderness.  Musculoskeletal:     Left hip: Tenderness present. No crepitus. Decreased range of motion.       Legs:  Lymphadenopathy:     Cervical: No cervical adenopathy.  Skin:    General: Skin is warm.     Findings: No erythema or rash.  Neurological:     Mental Status: She is alert and oriented to person, place, and time.     Cranial Nerves: No cranial nerve deficit.     Gait: Gait normal.     Comments: Otherwise stable gait, not assisted.  Psychiatric:     Comments: Well groomed, good eye contact.   ASSESSMENT AND PLAN:  [name] was seen today for establish care.  Diagnoses and all orders for this visit: Orders Placed This Encounter  Procedures  . DG Hip Unilat W OR W/O Pelvis 2-3 Views Left  . Ambulatory referral to Physical Therapy    Hip pain, left We discussed possible etiologies. Trochanteric bursitis and OA. Because hx of fall, X ray ordered. Topical voltaren recommended. Ortho evaluation can be considered.  Fall, initial encounter Fall precautions discussed. Some of her chronic medical problems and medications increase her risk for falls. PT will be arranged.  Vertigo Chronic. Meclizine is helping, we discussed some side effects. Clearly instructed about warning signs.  Essential hypertension BP adequately controlled. Continue Losartan 100 mg daily. Low salt diet, annual eye exam.  Venous insufficiency of both lower extremities Continue Torsemide 20 mg daily. LE elevation and compression stockings recommended.  Hypokalemia Continue KLOR same dose. Some side effects of diuretics discussed.  Mixed hyperlipidemia She has not tolerated statins. LDL goal < 70, hx of CAD. Continue low fat diet.  Malignant neoplasm of overlapping sites of bladder ([place]) S/P tumor resection. Following with urologist, Dr Milford Cage.  Spent 40 minutes with pt.  During this time history was obtained and  documented, examination was performed, prior labs/imaging  reviewed, and assessment/plan discussed.  Return in about 3 months (around 11/21/2020) for HTN.   Blessing Zaucha G. Martinique, MD  [facility]. [place] office.   A few things to remember from today's visit:  Hip pain, left - Plan: Ambulatory referral to Physical Therapy, DG Hip Unilat W OR W/O Pelvis 2-3 Views Left  Fall, initial encounter - Plan: Ambulatory referral to Physical Therapy  Vertigo  Essential hypertension  Venous insufficiency of both lower extremities  Hypokalemia  If you need refills please call your pharmacy. Do not use My Chart to request refills or for acute issues that need immediate attention.  No changes in meclizine for now. Hip pain can be arthritis. Try Voltaren gel.   Fall Prevention in the Home, Adult Falls can cause injuries and can happen to people of all ages. There are many things you can do to make your home safe and to help prevent falls. Ask for help when making these changes. What actions can I take to prevent falls? General Instructions  Use good lighting in all rooms. Replace any light bulbs that burn out.  Turn on the lights in dark areas. Use night-lights.  Keep items that you use often in easy-to-reach places. Lower the shelves around your home if needed.  Set up your furniture so you have a clear path. Avoid moving your furniture around.  Do not have throw rugs or other things on the floor that can make you trip.  Avoid walking on wet floors.  If any of your floors are uneven, fix them.  Add color or contrast paint or tape to clearly mark and help you see: ? Grab bars or handrails. ? First and last steps of staircases. ? Where the edge of each step is.  If you use a stepladder: ? Make sure that it is fully opened. Do not climb a closed stepladder. ? Make sure the sides of the stepladder are locked in place. ? Ask someone to hold the stepladder while you use  it.  Know where your pets are when moving through your home. What can I do in the bathroom?  Keep the floor dry. Clean up any water on the floor right away.  Remove soap buildup in the tub or shower.  Use nonskid mats or decals on the floor of the tub or shower.  Attach bath mats securely with double-sided, nonslip rug tape.  If you need to sit down in the shower, use a plastic, nonslip stool.  Install grab bars by the toilet and in the tub and shower. Do not use towel bars as grab bars.      What can I do in the bedroom?  Make sure that you have a light by your bed that is easy to reach.  Do not use any sheets or blankets for your bed that hang to the floor.  Have a firm chair with side arms that you can use for support when you get dressed. What can I do in the kitchen?  Clean up any spills right away.  If you need to reach something above you, use a step stool with a grab bar.  Keep electrical cords out of the way.  Do not use floor polish or wax that makes floors slippery. What can I do with my stairs?  Do not leave any items on the stairs.  Make sure that you have a light switch at the top and the bottom of the stairs.  Make sure that there are  handrails on both sides of the stairs. Fix handrails that are broken or loose.  Install nonslip stair treads on all your stairs.  Avoid having throw rugs at the top or bottom of the stairs.  Choose a carpet that does not hide the edge of the steps on the stairs.  Check carpeting to make sure that it is firmly attached to the stairs. Fix carpet that is loose or worn. What can I do on the outside of my home?  Use bright outdoor lighting.  Fix the edges of walkways and driveways and fix any cracks.  Remove anything that might make you trip as you walk through a door, such as a raised step or threshold.  Trim any bushes or trees on paths to your home.  Check to see if handrails are loose or broken and that both sides  of all steps have handrails.  Install guardrails along the edges of any raised decks and porches.  Clear paths of anything that can make you trip, such as tools or rocks.  Have leaves, snow, or ice cleared regularly.  Use sand or salt on paths during winter.  Clean up any spills in your garage right away. This includes grease or oil spills. What other actions can I take?  Wear shoes that: ? Have a low heel. Do not wear high heels. ? Have rubber bottoms. ? Feel good on your feet and fit well. ? Are closed at the toe. Do not wear open-toe sandals.  Use tools that help you move around if needed. These include: ? Canes. ? Walkers. ? Scooters. ? Crutches.  Review your medicines with your doctor. Some medicines can make you feel dizzy. This can increase your chance of falling. Ask your doctor what else you can do to help prevent falls. Where to find more information  Centers for Disease Control and Prevention, [employer]AD[employer]: http://www.wolf.info/  National Institute on Aging: http://kim-miller.com/ Contact a doctor if:  You are afraid of falling at home.  You feel weak, drowsy, or dizzy at home.  You fall at home. Summary  There are many simple things that you can do to make your home safe and to help prevent falls.  Ways to make your home safe include removing things that can make you trip and installing grab bars in the bathroom.  Ask for help when making these changes in your home. This information is not intended to replace advice given to you by your health care provider. Make sure you discuss any questions you have with your health care provider. Document Revised: 11/10/2019 Document Reviewed: 11/10/2019 Elsevier Patient Education  [place].   Please be sure medication list is accurate. If a new problem present, please set up appointment sooner than planned today.

## 2020-08-21 NOTE — Patient Instructions (Addendum)
A few things to remember from today's visit:  Hip pain, left - Plan: Ambulatory referral to Physical Therapy, DG Hip Unilat W OR W/O Pelvis 2-3 Views Left  Fall, initial encounter - Plan: Ambulatory referral to Physical Therapy  Vertigo  Essential hypertension  Venous insufficiency of both lower extremities  Hypokalemia  If you need refills please call your pharmacy. Do not use My Chart to request refills or for acute issues that need immediate attention.  No changes in meclizine for now. Hip pain can be arthritis. Try Voltaren gel.   Fall Prevention in the Home, Adult Falls can cause injuries and can happen to people of all ages. There are many things you can do to make your home safe and to help prevent falls. Ask for help when making these changes. What actions can I take to prevent falls? General Instructions  Use good lighting in all rooms. Replace any light bulbs that burn out.  Turn on the lights in dark areas. Use night-lights.  Keep items that you use often in easy-to-reach places. Lower the shelves around your home if needed.  Set up your furniture so you have a clear path. Avoid moving your furniture around.  Do not have throw rugs or other things on the floor that can make you trip.  Avoid walking on wet floors.  If any of your floors are uneven, fix them.  Add color or contrast paint or tape to clearly mark and help you see: ? Grab bars or handrails. ? First and last steps of staircases. ? Where the edge of each step is.  If you use a stepladder: ? Make sure that it is fully opened. Do not climb a closed stepladder. ? Make sure the sides of the stepladder are locked in place. ? Ask someone to hold the stepladder while you use it.  Know where your pets are when moving through your home. What can I do in the bathroom?  Keep the floor dry. Clean up any water on the floor right away.  Remove soap buildup in the tub or shower.  Use nonskid mats or  decals on the floor of the tub or shower.  Attach bath mats securely with double-sided, nonslip rug tape.  If you need to sit down in the shower, use a plastic, nonslip stool.  Install grab bars by the toilet and in the tub and shower. Do not use towel bars as grab bars.      What can I do in the bedroom?  Make sure that you have a light by your bed that is easy to reach.  Do not use any sheets or blankets for your bed that hang to the floor.  Have a firm chair with side arms that you can use for support when you get dressed. What can I do in the kitchen?  Clean up any spills right away.  If you need to reach something above you, use a step stool with a grab bar.  Keep electrical cords out of the way.  Do not use floor polish or wax that makes floors slippery. What can I do with my stairs?  Do not leave any items on the stairs.  Make sure that you have a light switch at the top and the bottom of the stairs.  Make sure that there are handrails on both sides of the stairs. Fix handrails that are broken or loose.  Install nonslip stair treads on all your stairs.  Avoid having throw rugs  at the top or bottom of the stairs.  Choose a carpet that does not hide the edge of the steps on the stairs.  Check carpeting to make sure that it is firmly attached to the stairs. Fix carpet that is loose or worn. What can I do on the outside of my home?  Use bright outdoor lighting.  Fix the edges of walkways and driveways and fix any cracks.  Remove anything that might make you trip as you walk through a door, such as a raised step or threshold.  Trim any bushes or trees on paths to your home.  Check to see if handrails are loose or broken and that both sides of all steps have handrails.  Install guardrails along the edges of any raised decks and porches.  Clear paths of anything that can make you trip, such as tools or rocks.  Have leaves, snow, or ice cleared regularly.  Use  sand or salt on paths during winter.  Clean up any spills in your garage right away. This includes grease or oil spills. What other actions can I take?  Wear shoes that: ? Have a low heel. Do not wear high heels. ? Have rubber bottoms. ? Feel good on your feet and fit well. ? Are closed at the toe. Do not wear open-toe sandals.  Use tools that help you move around if needed. These include: ? Canes. ? Walkers. ? Scooters. ? Crutches.  Review your medicines with your doctor. Some medicines can make you feel dizzy. This can increase your chance of falling. Ask your doctor what else you can do to help prevent falls. Where to find more information  Centers for Disease Control and Prevention, [employer]AD[employer]: http://www.wolf.info/  National Institute on Aging: http://kim-miller.com/ Contact a doctor if:  You are afraid of falling at home.  You feel weak, drowsy, or dizzy at home.  You fall at home. Summary  There are many simple things that you can do to make your home safe and to help prevent falls.  Ways to make your home safe include removing things that can make you trip and installing grab bars in the bathroom.  Ask for help when making these changes in your home. This information is not intended to replace advice given to you by your health care provider. Make sure you discuss any questions you have with your health care provider. Document Revised: 11/10/2019 Document Reviewed: 11/10/2019 Elsevier Patient Education  [place].   Please be sure medication list is accurate. If a new problem present, please set up appointment sooner than planned today.

## 2020-08-29 ENCOUNTER — Other Ambulatory Visit: Payer: Self-pay

## 2020-08-29 ENCOUNTER — Ambulatory Visit: Payer: Medicare Other | Attending: Family Medicine | Admitting: Physical Therapy

## 2020-08-29 DIAGNOSIS — M25552 Pain in left hip: Secondary | ICD-10-CM | POA: Diagnosis not present

## 2020-08-29 DIAGNOSIS — Z9181 History of falling: Secondary | ICD-10-CM | POA: Diagnosis not present

## 2020-08-29 DIAGNOSIS — M6281 Muscle weakness (generalized): Secondary | ICD-10-CM | POA: Insufficient documentation

## 2020-08-29 DIAGNOSIS — R262 Difficulty in walking, not elsewhere classified: Secondary | ICD-10-CM | POA: Insufficient documentation

## 2020-08-30 ENCOUNTER — Encounter: Payer: Self-pay | Admitting: Physical Therapy

## 2020-08-30 NOTE — Therapy (Signed)
[facility] [address] [place], [place], [address] Phone: [phone]   Fax:  [phone]  Physical Therapy Evaluation  Patient Details  Name: [name] MRN: [id number] Date of Birth: 1946-01-19 Referring Provider (PT): Martinique, Betty G, MD   Encounter Date: 08/29/2020   PT End of Session - 08/30/20 1403    Visit Number 1    Date for PT Re-Evaluation 11/21/20    Authorization Type medicare A/B    Progress Note Due on Visit 10    PT Start Time 1100    PT Stop Time 1142    PT Time Calculation (min) 42 min    Activity Tolerance Patient tolerated treatment well    Behavior During Therapy [facility] for tasks assessed/performed           Past Medical History:  Diagnosis Date  . Bilateral lower extremity edema    per pt left ankle greater than right  . Bladder tumor   . CAD (coronary artery disease)    cardiac cath 01/ 2005 by dr Terrence Dupont,  non-obstructive cad;  nuclear study 07-31-2016  normal with no evidence ischemia,nuclear ef 52%  . Diverticulosis of colon   . Fibromyalgia   . GERD (gastroesophageal reflux disease)   . Hematuria   . Hemorrhoids   . Hiatal hernia   . History of cancer chemotherapy 2000   left breast cancer  . History of diverticulitis of colon   . History of gastritis   . History of kidney stones   . History of left breast cancer 2000   dx 2000 and s/p left mastectomy w/ node dissection;  completed chemo and radiation therapy same year 2000 and completed anti-estrogen 2005;  per pt released from oncologist 2006 and no recurrence  . Hyperlipidemia   . Hypertension    followed by pcp  . Lower urinary tract symptoms (LUTS)    freq/ urg/ nocturia  . Personal history of radiation therapy 2000   left breast cancer  . Wears glasses     Past Surgical History:  Procedure Laterality Date  . ABDOMINAL HYSTERECTOMY  1978   AND LYSIS ADHESIONS /  APPENDECTOMY  (still has ovaries)  .  ANTERIOR AND POSTERIOR REPAIR WITH SACROSPINOUS FIXATION  07-11-2008  @[facility]    AND ENTEROCELE REPAIR/ UTEROSACRAL COLPOSUSPENSION  . BREAST REDUCTION SURGERY Right 2000  . CARDIAC CATHETERIZATION  05-10-2003   dr Terrence Dupont   mild non-obstructive cad,  ef 55-60%  . CATARACT EXTRACTION W/ INTRAOCULAR LENS  IMPLANT, BILATERAL  2015  . CYST EXCISION  02-28-2020  @[facility]    right axilla  . CYSTOSCOPY W/ RETROGRADES Bilateral 03/21/2020   Procedure: CYSTOSCOPY WITH RETROGRADE PYELOGRAM;  Surgeon: Remi Haggard, MD;  Location: [facility];  Service: Urology;  Laterality: Bilateral;  . INGUINAL HERNIA REPAIR  09/27/2011   Procedure: HERNIA REPAIR INGUINAL ADULT;  Surgeon: Gwenyth Ober, MD;  Location: [facility];  Service: General;  Laterality: Right;  . KNEE ARTHROSCOPY Left 1986  . MASTECTOMY WITH AXILLARY LYMPH NODE DISSECTION Left 2000   W/  RECONSTRUCTION  . NASAL SINUS SURGERY  1980s  . ROTATOR CUFF REPAIR Right 1985  . TONSILLECTOMY  child  . TRANSURETHRAL RESECTION OF BLADDER TUMOR N/A 03/21/2020   Procedure: TRANSURETHRAL RESECTION OF BLADDER TUMOR (TURBT);  Surgeon: Remi Haggard, MD;  Location: [facility];  Service: Urology;  Laterality: N/A;  30 MINS    There were no vitals filed for this visit.  Subjective Assessment - 08/29/20 1103    Subjective Pt has complex history as detailed below    Pertinent History hx of breast and bladder cancer, hysterectomy, ant/post wall repair,TRAM flap surgery    Limitations Standing;Walking;Other (comment)   steps   Patient Stated Goals up/down steps and walking    Currently in Pain? Yes    Pain Score 1    gets up to 12/10   Pain Location Hip    Pain Orientation Left    Pain Descriptors / Indicators Constant;Throbbing    Pain Type Chronic pain    Pain Radiating Towards side of the hip into the anteroir/groin    Pain Onset More than a month ago    Pain Frequency Intermittent    Aggravating Factors  not sure, just  wake up like that, can't put weight on it    Pain Relieving Factors sitting or lying down isn't as bad    Effect of Pain on Daily Activities walking is much less    Multiple Pain Sites No              [facility] PT Assessment - 08/30/20 0001      Assessment   Medical Diagnosis M25.552 (ICD-10-CM) - Hip pain, left; W19.Merril Abbe (ICD-10-CM) - Fall, initial encounter    Referring Provider (PT) Martinique, Betty G, MD    Onset Date/Surgical Date --   ongoing   Prior Therapy No      Precautions   Precautions None      Restrictions   Weight Bearing Restrictions No      Balance Screen   Has the patient fallen in the past 6 months Yes    How many times? 1    Has the patient had a decrease in activity level because of a fear of falling?  Yes    Is the patient reluctant to leave their home because of a fear of falling?  Yes   depends where I am going - stairs are hard and feel like my leg will give out     [place] residence    Living Arrangements Alone      Prior Function   Level of Independence Independent      Cognition   Overall Cognitive Status Within Functional Limits for tasks assessed      Observation/Other Assessments   Focus on Therapeutic Outcomes (FOTO)  37      ROM / Strength   AROM / PROM / Strength PROM      PROM   Overall PROM Comments Lt hip ER and flexion increased pain      Strength   Left Hip Flexion 4-/5    Left Hip ADduction 4-/5    Left Knee Flexion 5/5    Left Knee Extension 4/5    Left Ankle Dorsiflexion 4/5      Flexibility   Soft Tissue Assessment /Muscle Length yes    Hamstrings 70%      Palpation   Palpation comment lumbar and gluteals tight; hip flexors tight; TTP Lt greater trochanteric bursa      Special Tests   Other special tests Long axis distraction Lt hip was relieving      High Level Balance   High Level Balance Comments step forward and can stand 30 sec but shaky on Lt LE; neutral stance with eyes  closed okay      Functional Gait  Assessment   Gait assessed  Yes    Gait  Level Surface Walks 20 ft, slow speed, abnormal gait pattern, evidence for imbalance or deviates 10-15 in outside of the 12 in walkway width. Requires more than 7 sec to ambulate 20 ft.    Change in Gait Speed Able to smoothly change walking speed without loss of balance or gait deviation. Deviate no more than 6 in outside of the 12 in walkway width.    Gait with Horizontal Head Turns Performs head turns smoothly with slight change in gait velocity (eg, minor disruption to smooth gait path), deviates 6-10 in outside 12 in walkway width, or uses an assistive device.    Gait with Vertical Head Turns Performs task with slight change in gait velocity (eg, minor disruption to smooth gait path), deviates 6 - 10 in outside 12 in walkway width or uses assistive device    Gait and Pivot Turn Pivot turns safely in greater than 3 sec and stops with no loss of balance, or pivot turns safely within 3 sec and stops with mild imbalance, requires small steps to catch balance.    Step Over Obstacle Cannot perform without assistance.    Gait with Narrow Base of Support Ambulates 4-7 steps.    Gait with Eyes Closed Walks 20 ft, slow speed, abnormal gait pattern, evidence for imbalance, deviates 10-15 in outside 12 in walkway width. Requires more than 9 sec to ambulate 20 ft.    Ambulating Backwards Walks 20 ft, slow speed, abnormal gait pattern, evidence for imbalance, deviates 10-15 in outside 12 in walkway width.    Steps Alternating feet, must use rail.    Total Score 15                      Objective measurements completed on examination: See above findings.       [facility] Adult PT Treatment/Exercise - 08/30/20 0001      Ambulation/Gait   Ambulation/Gait --   trendelenburg Lt side mild   Gait Pattern Decreased hip/knee flexion - left;Trendelenburg   veering to Rt sometimes     Posture/Postural Control   Posture/Postural  Control Postural limitations    Postural Limitations Increased lumbar lordosis;Rounded Shoulders;Forward head;Weight shift left                    PT Short Term Goals - 08/30/20 2023      PT SHORT TERM GOAL #1   Title Pt will report at least 25% less hip pain    Time 4    Period Weeks    Status New    Target Date 09/27/20      PT SHORT TERM GOAL #2   Title Pt will be ind with initial HEP    Time 4    Period Weeks    Status New    Target Date 09/27/20      PT SHORT TERM GOAL #3   Title Pt will improve FGA by at least 3 points for reduced risk of falls    Time 4    Period Weeks    Status New    Target Date 09/27/20             PT Long Term Goals - 08/30/20 2019      PT LONG TERM GOAL #1   Title Pt will improve FGA by at least 6 points for reduced risk of falls    Time 12    Period Weeks    Status New    Target Date 11/21/20  PT LONG TERM GOAL #2   Title Pt will be able to ambulate and up and 4 step in the gym without UE support or deviations due to improved LE strength    Time 12    Period Weeks    Status New    Target Date 11/21/20      PT LONG TERM GOAL #3   Title Pt will report at least 60% less hip pain due to improved pain management techniques    Time 12    Period Weeks    Status New    Target Date 11/21/20      PT LONG TERM GOAL #4   Title FOTO score will improve to at least 60 based on outcome prediction    Time 12    Period Weeks    Status New    Target Date 11/21/20                  Plan - 08/30/20 2109    Clinical Impression Statement Pt presents to skilled PT due to chronic left hip pain as well as unsteady gait that has caused fall receently. Pt has Lt hip pain with flexion and ER but pain eased with long axis distraction. Pt demonstrates weakness with excess trunk lean to left during gait. She has tension in low back and throughout her left hipand fascial restrictions throughout abdominal wall due to multiple  surgeries from cancer as seen in history. Pt has increased lumbar lordosis and hip flexors tight. Dynamic gait index demonstrates balance deficits are dynamic and poses a fall risk with walking. She has a hard time especailly walking up and down stairs and stepping over objects. LE weakness present as well. pt will benefit from skilled PT to address all impairments as mentioned above and restore strength and balance to reduce risk of falls during functional activities.    Personal Factors and Comorbidities Comorbidity 3+    Comorbidities chronic left hip, hysterectomy, ant/post repair of prolapse, TRAM flap    Examination-Activity Limitations Stand;Stairs;Locomotion Level;Transfers    Examination-Participation Restrictions Community Activity    Stability/Clinical Decision Making Evolving/Moderate complexity    Rehab Potential Excellent    PT Frequency 2x / week    PT Duration 12 weeks    PT Treatment/Interventions ADLs/Self Care Home Management;Aquatic Therapy;Cryotherapy;Electrical Stimulation;Iontophoresis 4mg /ml Dexamethasone;Moist Heat;Therapeutic activities;Gait training;Traction;Ultrasound;Therapeutic exercise;Neuromuscular re-education;Patient/family education;Manual techniques;Scar mobilization;Taping;Dry needling;Passive range of motion;Stair training    PT Next Visit Plan dynamic balance, hip ROM and distraction; core and hip strength    PT Home Exercise Plan hamstring stretch    Consulted and Agree with Plan of Care Patient           Patient will benefit from skilled therapeutic intervention in order to improve the following deficits and impairments:  Pain,Postural dysfunction,Impaired flexibility,Increased fascial restricitons,Decreased strength,Abnormal gait,Difficulty walking,Decreased range of motion,Decreased balance  Visit Diagnosis: Difficulty in walking, not elsewhere classified  History of falling  Pain in left hip  Muscle weakness (generalized)     Problem  List Patient Active Problem List   Diagnosis Date Noted  . Malignant neoplasm of overlapping sites of bladder ([place]) 08/21/2020  . Bilateral leg edema 03/08/2020  . Mild concentric left ventricular hypertrophy (LVH) 11/25/2019  . Hyperglycemia 11/25/2019  . DDD (degenerative disc disease), thoracolumbar 11/25/2019  . Diastolic dysfunction without heart failure 11/02/2019  . Enchondroma of finger of right hand 07/22/2019  . Slow transit constipation 07/22/2019  . Venous insufficiency of both lower extremities 01/11/2019  . Abdominal aortic  atherosclerosis ([place]) 01/11/2019  . Malignant neoplasm of left female breast ([place]) 01/11/2019  . Cervical radiculopathy 07/07/2017  . Insomnia 01/14/2014  . Mixed hyperlipidemia 12/14/2013  . HTN (hypertension) 12/14/2013  . GERD (gastroesophageal reflux disease) 12/14/2013  . CAD (coronary artery disease) 12/14/2013  . Depression 12/14/2013  . Fibromyalgia 12/14/2013    Jule Ser, PT 08/30/2020, 9:10 PM  [facility] [address] [place], [place], [address] Phone: [phone]   Fax:  [phone]  Name: BEE MARCHIANO MRN: [id number] Date of Birth: [birth year]

## 2020-08-31 ENCOUNTER — Ambulatory Visit: Payer: Medicare Other

## 2020-08-31 ENCOUNTER — Other Ambulatory Visit: Payer: Self-pay

## 2020-08-31 DIAGNOSIS — R262 Difficulty in walking, not elsewhere classified: Secondary | ICD-10-CM

## 2020-08-31 DIAGNOSIS — M25552 Pain in left hip: Secondary | ICD-10-CM | POA: Diagnosis not present

## 2020-08-31 DIAGNOSIS — M6281 Muscle weakness (generalized): Secondary | ICD-10-CM

## 2020-08-31 DIAGNOSIS — Z9181 History of falling: Secondary | ICD-10-CM

## 2020-08-31 NOTE — Therapy (Signed)
[facility] [address], [place], [place], [address] Phone: [phone]   Fax:  [phone]  Physical Therapy Treatment  Patient Details  Name: [name] MRN: [id number] Date of Birth: 1945/05/27 Referring Provider (PT): Martinique, Betty G, MD   Encounter Date: 08/31/2020   PT End of Session - 08/31/20 1310    Visit Number 2    Date for PT Re-Evaluation 11/21/20    Authorization Type medicare A/B    Progress Note Due on Visit 10    PT Start Time 1232    PT Stop Time 1315    PT Time Calculation (min) 43 min    Activity Tolerance Patient tolerated treatment well    Behavior During Therapy [facility] for tasks assessed/performed           Past Medical History:  Diagnosis Date  . Bilateral lower extremity edema    per pt left ankle greater than right  . Bladder tumor   . CAD (coronary artery disease)    cardiac cath 01/ 2005 by dr Terrence Dupont,  non-obstructive cad;  nuclear study 07-31-2016  normal with no evidence ischemia,nuclear ef 52%  . Diverticulosis of colon   . Fibromyalgia   . GERD (gastroesophageal reflux disease)   . Hematuria   . Hemorrhoids   . Hiatal hernia   . History of cancer chemotherapy 2000   left breast cancer  . History of diverticulitis of colon   . History of gastritis   . History of kidney stones   . History of left breast cancer 2000   dx 2000 and s/p left mastectomy w/ node dissection;  completed chemo and radiation therapy same year 2000 and completed anti-estrogen 2005;  per pt released from oncologist 2006 and no recurrence  . Hyperlipidemia   . Hypertension    followed by pcp  . Lower urinary tract symptoms (LUTS)    freq/ urg/ nocturia  . Personal history of radiation therapy 2000   left breast cancer  . Wears glasses     Past Surgical History:  Procedure Laterality Date  . ABDOMINAL HYSTERECTOMY  1978   AND LYSIS ADHESIONS /  APPENDECTOMY  (still has ovaries)  .  ANTERIOR AND POSTERIOR REPAIR WITH SACROSPINOUS FIXATION  07-11-2008  @[facility]    AND ENTEROCELE REPAIR/ UTEROSACRAL COLPOSUSPENSION  . BREAST REDUCTION SURGERY Right 2000  . CARDIAC CATHETERIZATION  05-10-2003   dr Terrence Dupont   mild non-obstructive cad,  ef 55-60%  . CATARACT EXTRACTION W/ INTRAOCULAR LENS  IMPLANT, BILATERAL  2015  . CYST EXCISION  02-28-2020  @[facility]    right axilla  . CYSTOSCOPY W/ RETROGRADES Bilateral 03/21/2020   Procedure: CYSTOSCOPY WITH RETROGRADE PYELOGRAM;  Surgeon: Remi Haggard, MD;  Location: [facility];  Service: Urology;  Laterality: Bilateral;  . INGUINAL HERNIA REPAIR  09/27/2011   Procedure: HERNIA REPAIR INGUINAL ADULT;  Surgeon: Gwenyth Ober, MD;  Location: [place];  Service: General;  Laterality: Right;  . KNEE ARTHROSCOPY Left 1986  . MASTECTOMY WITH AXILLARY LYMPH NODE DISSECTION Left 2000   W/  RECONSTRUCTION  . NASAL SINUS SURGERY  1980s  . ROTATOR CUFF REPAIR Right 1985  . TONSILLECTOMY  child  . TRANSURETHRAL RESECTION OF BLADDER TUMOR N/A 03/21/2020   Procedure: TRANSURETHRAL RESECTION OF BLADDER TUMOR (TURBT);  Surgeon: Remi Haggard, MD;  Location: [facility];  Service: Urology;  Laterality: N/A;  30 MINS    There were no vitals filed for this visit.  Subjective Assessment - 08/31/20 1233    Subjective I've been doing my exercise and it is hurting a little bit.    Currently in Pain? Yes    Pain Score 5     Pain Location Hip                             [facility] Adult PT Treatment/Exercise - 08/31/20 0001      Exercises   Exercises Knee/Hip;Lumbar      Lumbar Exercises: Stretches   Active Hamstring Stretch 3 reps;20 seconds    Single Knee to Chest Stretch 3 reps;20 seconds    Single Knee to Chest Stretch Limitations diagonal    Piriformis Stretch 2 reps;20 seconds;Left;Right    Piriformis Stretch Limitations seated      Lumbar Exercises: Aerobic   Nustep Level 2 x 6 minutes- PT  present to discuss progress      Lumbar Exercises: Seated   Sit to Stand 20 reps    Sit to Stand Limitations pad in chair to elevate surface and improve control.      Lumbar Exercises: Supine   Ab Set 10 reps    AB Set Limitations with hip adduction ball squeeze 5" hold    Clam 20 reps    Clam Limitations green loop with abdominal bracing      Knee/Hip Exercises: Standing   Heel Raises Both;2 sets;10 reps                  PT Education - 08/31/20 1255    Education Details Access Code: [email or web address]    Person(s) Educated Patient    Methods Explanation;Demonstration;Handout    Comprehension Verbalized understanding;Returned demonstration            PT Short Term Goals - 08/30/20 2023      PT SHORT TERM GOAL #1   Title Pt will report at least 25% less hip pain    Time 4    Period Weeks    Status New    Target Date 09/27/20      PT SHORT TERM GOAL #2   Title Pt will be ind with initial HEP    Time 4    Period Weeks    Status New    Target Date 09/27/20      PT SHORT TERM GOAL #3   Title Pt will improve FGA by at least 3 points for reduced risk of falls    Time 4    Period Weeks    Status New    Target Date 09/27/20             PT Long Term Goals - 08/30/20 2019      PT LONG TERM GOAL #1   Title Pt will improve FGA by at least 6 points for reduced risk of falls    Time 12    Period Weeks    Status New    Target Date 11/21/20      PT LONG TERM GOAL #2   Title Pt will be able to ambulate and up and 4 step in the gym without UE support or deviations due to improved LE strength    Time 12    Period Weeks    Status New    Target Date 11/21/20      PT LONG TERM GOAL #3   Title Pt will report at least 60% less hip pain due to improved pain management techniques  Time 12    Period Weeks    Status New    Target Date 11/21/20      PT LONG TERM GOAL #4   Title FOTO score will improve to at least 60 based on outcome prediction    Time 12    Period  Weeks    Status New    Target Date 11/21/20                 Plan - 08/31/20 1312    Clinical Impression Statement Today is first time follow-up after evaluation.  Pt has been performing hamstring stretch issued on first session.  Session today focused on building a HEP for hip flexibility, functional mobility and strength to improve balance and endurance.  Pt required verbal and tactile cues for technique and control with sit to stand and foam pad was added to the chair to elevate surface.  Pt will continue to benefit from skilled PT to address Lt hip pain, improve balance and mobility for safety at home and in the community.    PT Frequency 2x / week    PT Duration 12 weeks    PT Treatment/Interventions ADLs/Self Care Home Management;Aquatic Therapy;Cryotherapy;Electrical Stimulation;Iontophoresis 4mg /ml Dexamethasone;Moist Heat;Therapeutic activities;Gait training;Traction;Ultrasound;Therapeutic exercise;Neuromuscular re-education;Patient/family education;Manual techniques;Scar mobilization;Taping;Dry needling;Passive range of motion;Stair training    PT Next Visit Plan dynamic balance, hip ROM and distraction; core and hip strength    PT Home Exercise Plan Access Code: [email or web address]    Consulted and Agree with Plan of Care Patient           Patient will benefit from skilled therapeutic intervention in order to improve the following deficits and impairments:  Pain,Postural dysfunction,Impaired flexibility,Increased fascial restricitons,Decreased strength,Abnormal gait,Difficulty walking,Decreased range of motion,Decreased balance  Visit Diagnosis: Difficulty in walking, not elsewhere classified  History of falling  Muscle weakness (generalized)     Problem List Patient Active Problem List   Diagnosis Date Noted  . Malignant neoplasm of overlapping sites of bladder ([place]) 08/21/2020  . Bilateral leg edema 03/08/2020  . Mild concentric left ventricular hypertrophy (LVH)  11/25/2019  . Hyperglycemia 11/25/2019  . DDD (degenerative disc disease), thoracolumbar 11/25/2019  . Diastolic dysfunction without heart failure 11/02/2019  . Enchondroma of finger of right hand 07/22/2019  . Slow transit constipation 07/22/2019  . Venous insufficiency of both lower extremities 01/11/2019  . Abdominal aortic atherosclerosis ([place]) 01/11/2019  . Malignant neoplasm of left female breast ([place]) 01/11/2019  . Cervical radiculopathy 07/07/2017  . Insomnia 01/14/2014  . Mixed hyperlipidemia 12/14/2013  . HTN (hypertension) 12/14/2013  . GERD (gastroesophageal reflux disease) 12/14/2013  . CAD (coronary artery disease) 12/14/2013  . Depression 12/14/2013  . Fibromyalgia 12/14/2013   Sigurd Sos, PT 08/31/20 1:12 PM  [facility] [address], [place], [place], [address] Phone: [phone]   Fax:  [phone]  Name: [name] MRN: [id number] Date of Birth: [birth year]

## 2020-08-31 NOTE — Patient Instructions (Signed)
Access Code: [email or web address] URL: [email or web address] Date: 08/31/2020 Prepared by: Claiborne Billings  Exercises Seated Hamstring Stretch - 3 x daily - 7 x weekly - 1 sets - 3 reps - 20 hold Supine Piriformis Stretch with Leg Straight - 2 x daily - 7 x weekly - 1 sets - 3 reps - 20 hold Seated Figure 4 Piriformis Stretch - 2 x daily - 7 x weekly - 1 sets - 3 reps - 20 hold Sit to Stand - 2 x daily - 7 x weekly - 2 sets - 10 reps Heel rises with counter support - 2 x daily - 7 x weekly - 2 sets - 10 reps

## 2020-09-01 ENCOUNTER — Other Ambulatory Visit: Payer: Self-pay

## 2020-09-01 DIAGNOSIS — I10 Essential (primary) hypertension: Secondary | ICD-10-CM

## 2020-09-01 DIAGNOSIS — R600 Localized edema: Secondary | ICD-10-CM

## 2020-09-01 MED ORDER — MECLIZINE HCL 25 MG PO TABS: 25.0000 mg | ORAL_TABLET | Freq: Three times a day (TID) | ORAL | 1 refills | Status: DC | PRN

## 2020-09-01 MED ORDER — TORSEMIDE 20 MG PO TABS: 20.0000 mg | ORAL_TABLET | Freq: Every day | ORAL | 1 refills | Status: DC

## 2020-09-05 ENCOUNTER — Ambulatory Visit: Payer: Medicare Other | Admitting: Internal Medicine

## 2020-09-05 ENCOUNTER — Ambulatory Visit: Payer: Medicare Other | Admitting: Physical Therapy

## 2020-09-05 ENCOUNTER — Other Ambulatory Visit: Payer: Self-pay

## 2020-09-05 DIAGNOSIS — Z9181 History of falling: Secondary | ICD-10-CM | POA: Diagnosis not present

## 2020-09-05 DIAGNOSIS — R262 Difficulty in walking, not elsewhere classified: Secondary | ICD-10-CM

## 2020-09-05 DIAGNOSIS — M25552 Pain in left hip: Secondary | ICD-10-CM | POA: Diagnosis not present

## 2020-09-05 DIAGNOSIS — M6281 Muscle weakness (generalized): Secondary | ICD-10-CM

## 2020-09-05 NOTE — Patient Instructions (Signed)
Access Code: [email or web address] URL: [email or web address] Date: 09/05/2020 Prepared by: Ruben Im  Exercises Seated Hamstring Stretch - 3 x daily - 7 x weekly - 1 sets - 3 reps - 20 hold Supine Piriformis Stretch with Leg Straight - 2 x daily - 7 x weekly - 1 sets - 3 reps - 20 hold Seated Figure 4 Piriformis Stretch - 2 x daily - 7 x weekly - 1 sets - 3 reps - 20 hold Sit to Stand - 2 x daily - 7 x weekly - 2 sets - 10 reps Heel rises with counter support - 2 x daily - 7 x weekly - 2 sets - 10 reps Clamshell - 1 x daily - 7 x weekly - 1 sets - 10 reps Forward Step Up with Counter Support - 1 x daily - 7 x weekly - 2 sets - 5 reps Standing Hip Abduction on Slider - 1 x daily - 7 x weekly - 2 sets - 5 reps

## 2020-09-05 NOTE — Therapy (Signed)
[facility] [address] [place], [place], [address] Phone: [phone]   Fax:  [phone]  Physical Therapy Treatment  Patient Details  Name: [name] MRN: [id number] Date of Birth: Mar 02, 1946 Referring Provider (PT): Martinique, Betty G, MD   Encounter Date: 09/05/2020   PT End of Session - 09/05/20 1704    Visit Number 3    Date for PT Re-Evaluation 11/21/20    Authorization Type medicare A/B    Progress Note Due on Visit 10    PT Start Time 6568    PT Stop Time 1653    PT Time Calculation (min) 38 min    Activity Tolerance Patient tolerated treatment well           Past Medical History:  Diagnosis Date  . Bilateral lower extremity edema    per pt left ankle greater than right  . Bladder tumor   . CAD (coronary artery disease)    cardiac cath 01/ 2005 by dr Terrence Dupont,  non-obstructive cad;  nuclear study 07-31-2016  normal with no evidence ischemia,nuclear ef 52%  . Diverticulosis of colon   . Fibromyalgia   . GERD (gastroesophageal reflux disease)   . Hematuria   . Hemorrhoids   . Hiatal hernia   . History of cancer chemotherapy 2000   left breast cancer  . History of diverticulitis of colon   . History of gastritis   . History of kidney stones   . History of left breast cancer 2000   dx 2000 and s/p left mastectomy w/ node dissection;  completed chemo and radiation therapy same year 2000 and completed anti-estrogen 2005;  per pt released from oncologist 2006 and no recurrence  . Hyperlipidemia   . Hypertension    followed by pcp  . Lower urinary tract symptoms (LUTS)    freq/ urg/ nocturia  . Personal history of radiation therapy 2000   left breast cancer  . Wears glasses     Past Surgical History:  Procedure Laterality Date  . ABDOMINAL HYSTERECTOMY  1978   AND LYSIS ADHESIONS /  APPENDECTOMY  (still has ovaries)  . ANTERIOR AND POSTERIOR REPAIR WITH SACROSPINOUS FIXATION   07-11-2008  @[facility]    AND ENTEROCELE REPAIR/ UTEROSACRAL COLPOSUSPENSION  . BREAST REDUCTION SURGERY Right 2000  . CARDIAC CATHETERIZATION  05-10-2003   dr Terrence Dupont   mild non-obstructive cad,  ef 55-60%  . CATARACT EXTRACTION W/ INTRAOCULAR LENS  IMPLANT, BILATERAL  2015  . CYST EXCISION  02-28-2020  @[facility]    right axilla  . CYSTOSCOPY W/ RETROGRADES Bilateral 03/21/2020   Procedure: CYSTOSCOPY WITH RETROGRADE PYELOGRAM;  Surgeon: Remi Haggard, MD;  [facility]: [facility];  Service: Urology;  Laterality: Bilateral;  . INGUINAL HERNIA REPAIR  09/27/2011   Procedure: HERNIA REPAIR INGUINAL ADULT;  Surgeon: Gwenyth Ober, MD;  Location: [place];  Service: General;  Laterality: Right;  . KNEE ARTHROSCOPY Left 1986  . MASTECTOMY WITH AXILLARY LYMPH NODE DISSECTION Left 2000   W/  RECONSTRUCTION  . NASAL SINUS SURGERY  1980s  . ROTATOR CUFF REPAIR Right 1985  . TONSILLECTOMY  child  . TRANSURETHRAL RESECTION OF BLADDER TUMOR N/A 03/21/2020   Procedure: TRANSURETHRAL RESECTION OF BLADDER TUMOR (TURBT);  Surgeon: Remi Haggard, MD;  Location: [facility];  Service: Urology;  Laterality: N/A;  30 MINS    There were no vitals filed for this visit.   Subjective Assessment - 09/05/20 1617  Subjective My hip is doing a little bit better.  2-3/10.  I was sore in my thighs last time.    Pertinent History hx of breast and bladder cancer, hysterectomy, ant/post wall repair,TRAM flap surgery; daughter getting married in [place] in July    Patient Stated Goals get up/down steps and walking for exercise    Currently in Pain? Yes    Pain Score 3     Pain Location Hip    Pain Orientation Left    Pain Type Chronic pain                             [facility] Adult PT Treatment/Exercise - 09/05/20 0001      Lumbar Exercises: Seated   Sit to Stand 10 reps    Sit to Stand Limitations mat table with no pad; no UE push    Other Seated Lumbar Exercises isometric  UE push downs for abdominal activation 10x      Lumbar Exercises: Supine   Ab Set 10 reps    Isometric Hip Flexion 5 reps      Knee/Hip Exercises: Stretches   Active Hamstring Stretch Right;Left;2 reps;20 seconds    Active Hamstring Stretch Limitations seated    Hip Flexor Stretch Limitations 2nd step hip flexor stretch 5x right/left with UE raise      Knee/Hip Exercises: Standing   Heel Raises Both;10 reps    Forward Step Up Right;Left;1 set;5 reps    Other Standing Knee Exercises abduction towel slides 5x right/left      Knee/Hip Exercises: Seated   Sit to Sand 2 sets;5 reps;without UE support      Knee/Hip Exercises: Sidelying   Clams 10x left side                  PT Education - 09/05/20 1655    Education Details sidelying clams; step ups; towel slides at the counter    Person(s) Educated Patient    Methods Explanation;Demonstration;Handout    Comprehension Verbalized understanding;Returned demonstration            PT Short Term Goals - 08/30/20 2023      PT SHORT TERM GOAL #1   Title Pt will report at least 25% less hip pain    Time 4    Period Weeks    Status New    Target Date 09/27/20      PT SHORT TERM GOAL #2   Title Pt will be ind with initial HEP    Time 4    Period Weeks    Status New    Target Date 09/27/20      PT SHORT TERM GOAL #3   Title Pt will improve FGA by at least 3 points for reduced risk of falls    Time 4    Period Weeks    Status New    Target Date 09/27/20             PT Long Term Goals - 08/30/20 2019      PT LONG TERM GOAL #1   Title Pt will improve FGA by at least 6 points for reduced risk of falls    Time 12    Period Weeks    Status New    Target Date 11/21/20      PT LONG TERM GOAL #2   Title Pt will be able to ambulate and up and 4 step in the gym without  UE support or deviations due to improved LE strength    Time 12    Period Weeks    Status New    Target Date 11/21/20      PT LONG TERM GOAL #3    Title Pt will report at least 60% less hip pain due to improved pain management techniques    Time 12    Period Weeks    Status New    Target Date 11/21/20      PT LONG TERM GOAL #4   Title FOTO score will improve to at least 60 based on outcome prediction    Time 12    Period Weeks    Status New    Target Date 11/21/20                 Plan - 09/05/20 1705    Clinical Impression Statement The patient reports decreasing hip pain overall and is able to progress with hip and lumbo/pelvic core strengthening.  She is now able to rise from a lower seat height without UE assist.  Verbal cues to coordinate breathing/exhalation to further activate lower abdominals.  Initiated step ups for general strengthening and function needed for trip to [facility] in July.  Therapist monitoring response.  She reports "pulling" but not pain in her hip.    Comorbidities chronic left hip, hysterectomy, ant/post repair of prolapse, TRAM flap    Examination-Activity Limitations Stand;Stairs;Locomotion Level;Transfers    Rehab Potential Excellent    PT Frequency 2x / week    PT Duration 12 weeks    PT Treatment/Interventions ADLs/Self Care Home Management;Aquatic Therapy;Cryotherapy;Electrical Stimulation;Iontophoresis 4mg /ml Dexamethasone;Moist Heat;Therapeutic activities;Gait training;Traction;Ultrasound;Therapeutic exercise;Neuromuscular re-education;Patient/family education;Manual techniques;Scar mobilization;Taping;Dry needling;Passive range of motion;Stair training    PT Next Visit Plan dynamic balance, hip ROM and distraction; core and hip strength    PT Home Exercise Plan Access Code: [email or web address]           Patient will benefit from skilled therapeutic intervention in order to improve the following deficits and impairments:  Pain,Postural dysfunction,Impaired flexibility,Increased fascial restricitons,Decreased strength,Abnormal gait,Difficulty walking,Decreased range of motion,Decreased  balance  Visit Diagnosis: Difficulty in walking, not elsewhere classified  History of falling  Muscle weakness (generalized)  Pain in left hip     Problem List Patient Active Problem List   Diagnosis Date Noted  . Malignant neoplasm of overlapping sites of bladder ([place]) 08/21/2020  . Bilateral leg edema 03/08/2020  . Mild concentric left ventricular hypertrophy (LVH) 11/25/2019  . Hyperglycemia 11/25/2019  . DDD (degenerative disc disease), thoracolumbar 11/25/2019  . Diastolic dysfunction without heart failure 11/02/2019  . Enchondroma of finger of right hand 07/22/2019  . Slow transit constipation 07/22/2019  . Venous insufficiency of both lower extremities 01/11/2019  . Abdominal aortic atherosclerosis ([place]) 01/11/2019  . Malignant neoplasm of left female breast ([place]) 01/11/2019  . Cervical radiculopathy 07/07/2017  . Insomnia 01/14/2014  . Mixed hyperlipidemia 12/14/2013  . HTN (hypertension) 12/14/2013  . GERD (gastroesophageal reflux disease) 12/14/2013  . CAD (coronary artery disease) 12/14/2013  . Depression 12/14/2013  . Fibromyalgia 12/14/2013   Ruben Im, PT 09/05/20 5:10 PM Phone: [phone] Fax: [phone] Alvera Singh 09/05/2020, 5:10 PM  [facility] [address] [place], [place], [address] Phone: [phone]   Fax:  [phone]  Name: [name] MRN: [id number] Date of Birth: 1945-09-05

## 2020-09-06 ENCOUNTER — Ambulatory Visit: Payer: Medicare Other | Admitting: Internal Medicine

## 2020-09-07 ENCOUNTER — Other Ambulatory Visit: Payer: Self-pay

## 2020-09-07 ENCOUNTER — Ambulatory Visit: Payer: Medicare Other

## 2020-09-07 DIAGNOSIS — M6281 Muscle weakness (generalized): Secondary | ICD-10-CM

## 2020-09-07 DIAGNOSIS — R262 Difficulty in walking, not elsewhere classified: Secondary | ICD-10-CM | POA: Diagnosis not present

## 2020-09-07 DIAGNOSIS — M25552 Pain in left hip: Secondary | ICD-10-CM | POA: Diagnosis not present

## 2020-09-07 DIAGNOSIS — Z9181 History of falling: Secondary | ICD-10-CM

## 2020-09-07 NOTE — Therapy (Signed)
[facility] [address], [place], [place], [address] Phone: [phone]   Fax:  [phone]  Physical Therapy Treatment  Patient Details  Name: [name] MRN: [id number] Date of Birth: [birth year] Referring Provider (PT): Martinique, Betty G, MD   Encounter Date: 09/07/2020   PT End of Session - 09/07/20 1228    Visit Number 4    Date for PT Re-Evaluation 11/21/20    Authorization Type medicare A/B    Progress Note Due on Visit 10    PT Start Time 1147    PT Stop Time 1228    PT Time Calculation (min) 41 min    Activity Tolerance Patient tolerated treatment well    Behavior During Therapy [facility] for tasks assessed/performed           Past Medical History:  Diagnosis Date  . Bilateral lower extremity edema    per pt left ankle greater than right  . Bladder tumor   . CAD (coronary artery disease)    cardiac cath 01/ 2005 by dr Terrence Dupont,  non-obstructive cad;  nuclear study 07-31-2016  normal with no evidence ischemia,nuclear ef 52%  . Diverticulosis of colon   . Fibromyalgia   . GERD (gastroesophageal reflux disease)   . Hematuria   . Hemorrhoids   . Hiatal hernia   . History of cancer chemotherapy 2000   left breast cancer  . History of diverticulitis of colon   . History of gastritis   . History of kidney stones   . History of left breast cancer 2000   dx 2000 and s/p left mastectomy w/ node dissection;  completed chemo and radiation therapy same year 2000 and completed anti-estrogen 2005;  per pt released from oncologist 2006 and no recurrence  . Hyperlipidemia   . Hypertension    followed by pcp  . Lower urinary tract symptoms (LUTS)    freq/ urg/ nocturia  . Personal history of radiation therapy 2000   left breast cancer  . Wears glasses     Past Surgical History:  Procedure Laterality Date  . ABDOMINAL HYSTERECTOMY  1978   AND LYSIS ADHESIONS /  APPENDECTOMY  (still has ovaries)  .  ANTERIOR AND POSTERIOR REPAIR WITH SACROSPINOUS FIXATION  07-11-2008  @[facility]    AND ENTEROCELE REPAIR/ UTEROSACRAL COLPOSUSPENSION  . BREAST REDUCTION SURGERY Right 2000  . CARDIAC CATHETERIZATION  05-10-2003   dr Terrence Dupont   mild non-obstructive cad,  ef 55-60%  . CATARACT EXTRACTION W/ INTRAOCULAR LENS  IMPLANT, BILATERAL  2015  . CYST EXCISION  02-28-2020  @[facility]    right axilla  . CYSTOSCOPY W/ RETROGRADES Bilateral 03/21/2020   Procedure: CYSTOSCOPY WITH RETROGRADE PYELOGRAM;  Surgeon: Remi Haggard, MD;  Location: [facility];  Service: Urology;  Laterality: Bilateral;  . INGUINAL HERNIA REPAIR  09/27/2011   Procedure: HERNIA REPAIR INGUINAL ADULT;  Surgeon: Gwenyth Ober, MD;  Location: [facility];  Service: General;  Laterality: Right;  . KNEE ARTHROSCOPY Left 1986  . MASTECTOMY WITH AXILLARY LYMPH NODE DISSECTION Left 2000   W/  RECONSTRUCTION  . NASAL SINUS SURGERY  1980s  . ROTATOR CUFF REPAIR Right 1985  . TONSILLECTOMY  child  . TRANSURETHRAL RESECTION OF BLADDER TUMOR N/A 03/21/2020   Procedure: TRANSURETHRAL RESECTION OF BLADDER TUMOR (TURBT);  Surgeon: Remi Haggard, MD;  Location: [facility];  Service: Urology;  Laterality: N/A;  30 MINS    There were no vitals filed for this visit.  Subjective Assessment - 09/07/20 1150    Subjective I feel 50-60% better since starting PT.  No pain today.  Some soreness after doing exrcises.    Patient Stated Goals get up/down steps and walking for exercise    Currently in Pain? No/denies                             [facility] Adult PT Treatment/Exercise - 09/07/20 0001      Lumbar Exercises: Stretches   Piriformis Stretch 2 reps;20 seconds;Left;Right    Piriformis Stretch Limitations seated      Lumbar Exercises: Aerobic   Nustep Level 2 x 8 minutes- PT present to discuss progress      Lumbar Exercises: Standing   Other Standing Lumbar Exercises dead lift to stool: 10# x8 reps       Lumbar Exercises: Seated   Sit to Stand 20 reps    Sit to Stand Limitations mat table with no pad; no UE push    Other Seated Lumbar Exercises isometric UE push downs for abdominal activation 10x- using foam roll      Knee/Hip Exercises: Stretches   Active Hamstring Stretch Right;Left;2 reps;20 seconds    Active Hamstring Stretch Limitations seated      Knee/Hip Exercises: Standing   Heel Raises 20 reps    Heel Raises Limitations standing on mini tramp    Rebounder weight shifting 3 ways x1 min each    Other Standing Knee Exercises tandem stance on balance pad 2x20 seconds each                    PT Short Term Goals - 08/30/20 2023      PT SHORT TERM GOAL #1   Title Pt will report at least 25% less hip pain    Time 4    Period Weeks    Status New    Target Date 09/27/20      PT SHORT TERM GOAL #2   Title Pt will be ind with initial HEP    Time 4    Period Weeks    Status New    Target Date 09/27/20      PT SHORT TERM GOAL #3   Title Pt will improve FGA by at least 3 points for reduced risk of falls    Time 4    Period Weeks    Status New    Target Date 09/27/20             PT Long Term Goals - 08/30/20 2019      PT LONG TERM GOAL #1   Title Pt will improve FGA by at least 6 points for reduced risk of falls    Time 12    Period Weeks    Status New    Target Date 11/21/20      PT LONG TERM GOAL #2   Title Pt will be able to ambulate and up and 4 step in the gym without UE support or deviations due to improved LE strength    Time 12    Period Weeks    Status New    Target Date 11/21/20      PT LONG TERM GOAL #3   Title Pt will report at least 60% less hip pain due to improved pain management techniques    Time 12    Period Weeks    Status New    Target Date 11/21/20  PT LONG TERM GOAL #4   Title FOTO score will improve to at least 60 based on outcome prediction    Time 12    Period Weeks    Status New    Target Date 11/21/20                  Plan - 09/07/20 1216    Clinical Impression Statement The patient reports decreasing hip pain overall and reports 50-60% overall improvement since the start of care.  She is now able to rise from a lower seat height without UE assist although is not able to control descent into lower seat.  Seat was elevated for 2nd set of sit to stand to work on control.   Pt tolerated advanced activity in the clinic today with mini tramp activity and tandem stance on foam pad.   Pt continues to work on step-ups at home for general strengthening and function needed for trip to [facility] in July.  PT monitored throughout session to monitor for safety and provide verbal cues for technique.  Pt will continue to benefit from skilled PT to address balance, strength and hip pain.    PT Frequency 2x / week    PT Duration 12 weeks    PT Treatment/Interventions ADLs/Self Care Home Management;Aquatic Therapy;Cryotherapy;Electrical Stimulation;Iontophoresis 4mg /ml Dexamethasone;Moist Heat;Therapeutic activities;Gait training;Traction;Ultrasound;Therapeutic exercise;Neuromuscular re-education;Patient/family education;Manual techniques;Scar mobilization;Taping;Dry needling;Passive range of motion;Stair training    PT Next Visit Plan dynamic balance, hip ROM and distraction; core and hip strength    PT Home Exercise Plan Access Code: [email or web address]    Recommended Other Services initial cert is signed    Consulted and Agree with Plan of Care Patient           Patient will benefit from skilled therapeutic intervention in order to improve the following deficits and impairments:  Pain,Postural dysfunction,Impaired flexibility,Increased fascial restricitons,Decreased strength,Abnormal gait,Difficulty walking,Decreased range of motion,Decreased balance  Visit Diagnosis: Difficulty in walking, not elsewhere classified  History of falling  Muscle weakness (generalized)  Pain in left hip     Problem List Patient  Active Problem List   Diagnosis Date Noted  . Malignant neoplasm of overlapping sites of bladder ([place]) 08/21/2020  . Bilateral leg edema 03/08/2020  . Mild concentric left ventricular hypertrophy (LVH) 11/25/2019  . Hyperglycemia 11/25/2019  . DDD (degenerative disc disease), thoracolumbar 11/25/2019  . Diastolic dysfunction without heart failure 11/02/2019  . Enchondroma of finger of right hand 07/22/2019  . Slow transit constipation 07/22/2019  . Venous insufficiency of both lower extremities 01/11/2019  . Abdominal aortic atherosclerosis ([place]) 01/11/2019  . Malignant neoplasm of left female breast ([place]) 01/11/2019  . Cervical radiculopathy 07/07/2017  . Insomnia 01/14/2014  . Mixed hyperlipidemia 12/14/2013  . HTN (hypertension) 12/14/2013  . GERD (gastroesophageal reflux disease) 12/14/2013  . CAD (coronary artery disease) 12/14/2013  . Depression 12/14/2013  . Fibromyalgia 12/14/2013     Sigurd Sos, PT 09/07/20 12:30 PM  [facility] [address], [place], [place], [address] Phone: [phone]   Fax:  [phone]  Name: SHERICA PATERNOSTRO MRN: [id number] Date of Birth: 05/27/1945

## 2020-09-11 ENCOUNTER — Other Ambulatory Visit: Payer: Self-pay

## 2020-09-11 ENCOUNTER — Ambulatory Visit: Payer: Medicare Other

## 2020-09-11 DIAGNOSIS — M25552 Pain in left hip: Secondary | ICD-10-CM

## 2020-09-11 DIAGNOSIS — M6281 Muscle weakness (generalized): Secondary | ICD-10-CM

## 2020-09-11 DIAGNOSIS — Z9181 History of falling: Secondary | ICD-10-CM

## 2020-09-11 DIAGNOSIS — R262 Difficulty in walking, not elsewhere classified: Secondary | ICD-10-CM | POA: Diagnosis not present

## 2020-09-11 NOTE — Therapy (Signed)
[facility] [address] [place], [place], [address] Phone: [phone]   Fax:  [phone]  Physical Therapy Treatment  Patient Details  Name: [name] MRN: [id number] Date of Birth: Aug 05, 1945 Referring Provider (PT): Martinique, Betty G, MD   Encounter Date: 09/11/2020   PT End of Session - 09/11/20 1302    Visit Number 5    Date for PT Re-Evaluation 11/21/20    Authorization Type medicare A/B    Progress Note Due on Visit 10    PT Start Time 1233    PT Stop Time 1311    PT Time Calculation (min) 38 min    Activity Tolerance Patient tolerated treatment well    Behavior During Therapy [facility] for tasks assessed/performed           Past Medical History:  Diagnosis Date  . Bilateral lower extremity edema    per pt left ankle greater than right  . Bladder tumor   . CAD (coronary artery disease)    cardiac cath 01/ 2005 by dr Terrence Dupont,  non-obstructive cad;  nuclear study 07-31-2016  normal with no evidence ischemia,nuclear ef 52%  . Diverticulosis of colon   . Fibromyalgia   . GERD (gastroesophageal reflux disease)   . Hematuria   . Hemorrhoids   . Hiatal hernia   . History of cancer chemotherapy 2000   left breast cancer  . History of diverticulitis of colon   . History of gastritis   . History of kidney stones   . History of left breast cancer 2000   dx 2000 and s/p left mastectomy w/ node dissection;  completed chemo and radiation therapy same year 2000 and completed anti-estrogen 2005;  per pt released from oncologist 2006 and no recurrence  . Hyperlipidemia   . Hypertension    followed by pcp  . Lower urinary tract symptoms (LUTS)    freq/ urg/ nocturia  . Personal history of radiation therapy 2000   left breast cancer  . Wears glasses     Past Surgical History:  Procedure Laterality Date  . ABDOMINAL HYSTERECTOMY  1978   AND LYSIS ADHESIONS /  APPENDECTOMY  (still has ovaries)  .  ANTERIOR AND POSTERIOR REPAIR WITH SACROSPINOUS FIXATION  07-11-2008  @[facility]    AND ENTEROCELE REPAIR/ UTEROSACRAL COLPOSUSPENSION  . BREAST REDUCTION SURGERY Right 2000  . CARDIAC CATHETERIZATION  05-10-2003   dr Terrence Dupont   mild non-obstructive cad,  ef 55-60%  . CATARACT EXTRACTION W/ INTRAOCULAR LENS  IMPLANT, BILATERAL  2015  . CYST EXCISION  02-28-2020  @[facility]    right axilla  . CYSTOSCOPY W/ RETROGRADES Bilateral 03/21/2020   Procedure: CYSTOSCOPY WITH RETROGRADE PYELOGRAM;  Surgeon: Remi Haggard, MD;  Location: [facility];  Service: Urology;  Laterality: Bilateral;  . INGUINAL HERNIA REPAIR  09/27/2011   Procedure: HERNIA REPAIR INGUINAL ADULT;  Surgeon: Gwenyth Ober, MD;  Location: [facility];  Service: General;  Laterality: Right;  . KNEE ARTHROSCOPY Left 1986  . MASTECTOMY WITH AXILLARY LYMPH NODE DISSECTION Left 2000   W/  RECONSTRUCTION  . NASAL SINUS SURGERY  1980s  . ROTATOR CUFF REPAIR Right 1985  . TONSILLECTOMY  child  . TRANSURETHRAL RESECTION OF BLADDER TUMOR N/A 03/21/2020   Procedure: TRANSURETHRAL RESECTION OF BLADDER TUMOR (TURBT);  Surgeon: Remi Haggard, MD;  Location: [facility];  Service: Urology;  Laterality: N/A;  30 MINS    There were no vitals filed for this visit.  Subjective Assessment - 09/11/20 1236    Subjective I am doing well.  I had some Lt gluteal pain on Saturday.  No known cause.  I had to take Tylenol.    Currently in Pain? No/denies                             [facility] Adult PT Treatment/Exercise - 09/11/20 0001      Lumbar Exercises: Stretches   Piriformis Stretch 2 reps;20 seconds;Left;Right    Piriformis Stretch Limitations seated      Lumbar Exercises: Aerobic   Nustep Level 2 x 8 minutes- PT present to discuss progress      Lumbar Exercises: Standing   Other Standing Lumbar Exercises dead lift to stool: 10# x8 reps      Lumbar Exercises: Seated   Sit to Stand 20 reps    Sit to  Stand Limitations mat table with no pad; no UE push    Other Seated Lumbar Exercises isometric UE push downs for abdominal activation 10x- using foam roll      Lumbar Exercises: Supine   Ab Set 10 reps      Knee/Hip Exercises: Stretches   Active Hamstring Stretch Right;Left;2 reps;20 seconds    Active Hamstring Stretch Limitations seated      Knee/Hip Exercises: Standing   Heel Raises 20 reps    Heel Raises Limitations standing on mini tramp    Rebounder weight shifting 3 ways x1 min each, heel raises x10    Other Standing Knee Exercises alternating step taps on edge of treadmill 2x10 each    Other Standing Knee Exercises tandem stance on balance pad 2x20 seconds each                    PT Short Term Goals - 08/30/20 2023      PT SHORT TERM GOAL #1   Title Pt will report at least 25% less hip pain    Time 4    Period Weeks    Status New    Target Date 09/27/20      PT SHORT TERM GOAL #2   Title Pt will be ind with initial HEP    Time 4    Period Weeks    Status New    Target Date 09/27/20      PT SHORT TERM GOAL #3   Title Pt will improve FGA by at least 3 points for reduced risk of falls    Time 4    Period Weeks    Status New    Target Date 09/27/20             PT Long Term Goals - 08/30/20 2019      PT LONG TERM GOAL #1   Title Pt will improve FGA by at least 6 points for reduced risk of falls    Time 12    Period Weeks    Status New    Target Date 11/21/20      PT LONG TERM GOAL #2   Title Pt will be able to ambulate and up and 4 step in the gym without UE support or deviations due to improved LE strength    Time 12    Period Weeks    Status New    Target Date 11/21/20      PT LONG TERM GOAL #3   Title Pt will report at least 60% less hip pain due  to improved pain management techniques    Time 12    Period Weeks    Status New    Target Date 11/21/20      PT LONG TERM GOAL #4   Title FOTO score will improve to at least 60 based on  outcome prediction    Time 12    Period Weeks    Status New    Target Date 11/21/20                 Plan - 09/11/20 1251    Clinical Impression Statement Pt continues to report decreased hip pain overall and reports 50-60% overall improvement since the start of care. Pt did have increased Lt gluteal pain 2 days ago due to more activity with cleaning her porch.   Pt was more easily fatigued today with foam pad required for both sets of sit to stand and reduced control with descending into the chair. Pt continues to work on step-ups at home for general strengthening and function needed for trip to [facility] in July.  PT monitored throughout session to monitor for safety and provide verbal cues for technique.  Pt will continue to benefit from skilled PT to address balance, strength and hip pain.    PT Frequency 2x / week    PT Duration 12 weeks    PT Treatment/Interventions ADLs/Self Care Home Management;Aquatic Therapy;Cryotherapy;Electrical Stimulation;Iontophoresis 4mg /ml Dexamethasone;Moist Heat;Therapeutic activities;Gait training;Traction;Ultrasound;Therapeutic exercise;Neuromuscular re-education;Patient/family education;Manual techniques;Scar mobilization;Taping;Dry needling;Passive range of motion;Stair training    PT Next Visit Plan dynamic balance, hip ROM and distraction; core and hip strength    PT Home Exercise Plan Access Code: [email or web address]    Consulted and Agree with Plan of Care Patient           Patient will benefit from skilled therapeutic intervention in order to improve the following deficits and impairments:  Pain,Postural dysfunction,Impaired flexibility,Increased fascial restricitons,Decreased strength,Abnormal gait,Difficulty walking,Decreased range of motion,Decreased balance  Visit Diagnosis: Difficulty in walking, not elsewhere classified  History of falling  Muscle weakness (generalized)  Pain in left hip     Problem List Patient Active Problem List    Diagnosis Date Noted  . Malignant neoplasm of overlapping sites of bladder ([place]) 08/21/2020  . Bilateral leg edema 03/08/2020  . Mild concentric left ventricular hypertrophy (LVH) 11/25/2019  . Hyperglycemia 11/25/2019  . DDD (degenerative disc disease), thoracolumbar 11/25/2019  . Diastolic dysfunction without heart failure 11/02/2019  . Enchondroma of finger of right hand 07/22/2019  . Slow transit constipation 07/22/2019  . Venous insufficiency of both lower extremities 01/11/2019  . Abdominal aortic atherosclerosis ([place]) 01/11/2019  . Malignant neoplasm of left female breast ([place]) 01/11/2019  . Cervical radiculopathy 07/07/2017  . Insomnia 01/14/2014  . Mixed hyperlipidemia 12/14/2013  . HTN (hypertension) 12/14/2013  . GERD (gastroesophageal reflux disease) 12/14/2013  . CAD (coronary artery disease) 12/14/2013  . Depression 12/14/2013  . Fibromyalgia 12/14/2013    Sigurd Sos, PT 09/11/20 1:07 PM  [facility] [address], [place], [place], [address] Phone: [phone]   Fax:  [phone]  Name: [name] MRN: [id number] Date of Birth: 04-Mar-1946

## 2020-09-14 ENCOUNTER — Ambulatory Visit: Payer: Medicare Other

## 2020-09-14 ENCOUNTER — Other Ambulatory Visit: Payer: Self-pay

## 2020-09-14 DIAGNOSIS — R262 Difficulty in walking, not elsewhere classified: Secondary | ICD-10-CM | POA: Diagnosis not present

## 2020-09-14 DIAGNOSIS — Z9181 History of falling: Secondary | ICD-10-CM | POA: Diagnosis not present

## 2020-09-14 DIAGNOSIS — M25552 Pain in left hip: Secondary | ICD-10-CM | POA: Diagnosis not present

## 2020-09-14 DIAGNOSIS — M6281 Muscle weakness (generalized): Secondary | ICD-10-CM | POA: Diagnosis not present

## 2020-09-14 NOTE — Therapy (Signed)
[facility] [address] [place], [place], [address] Phone: [phone]   Fax:  [phone]  Physical Therapy Treatment  Patient Details  Name: [name] MRN: [id number] Date of Birth: December 15, 1945 Referring Provider (PT): Martinique, Betty G, MD   Encounter Date: 09/14/2020   PT End of Session - 09/14/20 1309    Visit Number 6    Date for PT Re-Evaluation 11/21/20    Authorization Type medicare A/B    Progress Note Due on Visit 10    PT Start Time 1229    PT Stop Time 1312    PT Time Calculation (min) 43 min    Activity Tolerance Patient tolerated treatment well    Behavior During Therapy [facility] for tasks assessed/performed           Past Medical History:  Diagnosis Date  . Bilateral lower extremity edema    per pt left ankle greater than right  . Bladder tumor   . CAD (coronary artery disease)    cardiac cath 01/ 2005 by dr Terrence Dupont,  non-obstructive cad;  nuclear study 07-31-2016  normal with no evidence ischemia,nuclear ef 52%  . Diverticulosis of colon   . Fibromyalgia   . GERD (gastroesophageal reflux disease)   . Hematuria   . Hemorrhoids   . Hiatal hernia   . History of cancer chemotherapy 2000   left breast cancer  . History of diverticulitis of colon   . History of gastritis   . History of kidney stones   . History of left breast cancer 2000   dx 2000 and s/p left mastectomy w/ node dissection;  completed chemo and radiation therapy same year 2000 and completed anti-estrogen 2005;  per pt released from oncologist 2006 and no recurrence  . Hyperlipidemia   . Hypertension    followed by pcp  . Lower urinary tract symptoms (LUTS)    freq/ urg/ nocturia  . Personal history of radiation therapy 2000   left breast cancer  . Wears glasses     Past Surgical History:  Procedure Laterality Date  . ABDOMINAL HYSTERECTOMY  1978   AND LYSIS ADHESIONS /  APPENDECTOMY  (still has ovaries)  .  ANTERIOR AND POSTERIOR REPAIR WITH SACROSPINOUS FIXATION  07-11-2008  @[facility]    AND ENTEROCELE REPAIR/ UTEROSACRAL COLPOSUSPENSION  . BREAST REDUCTION SURGERY Right 2000  . CARDIAC CATHETERIZATION  05-10-2003   dr Terrence Dupont   mild non-obstructive cad,  ef 55-60%  . CATARACT EXTRACTION W/ INTRAOCULAR LENS  IMPLANT, BILATERAL  2015  . CYST EXCISION  02-28-2020  @[facility]    right axilla  . CYSTOSCOPY W/ RETROGRADES Bilateral 03/21/2020   Procedure: CYSTOSCOPY WITH RETROGRADE PYELOGRAM;  Surgeon: Remi Haggard, MD;  Location: [facility];  Service: Urology;  Laterality: Bilateral;  . INGUINAL HERNIA REPAIR  09/27/2011   Procedure: HERNIA REPAIR INGUINAL ADULT;  Surgeon: Gwenyth Ober, MD;  Location: [place] [facility];  Service: General;  Laterality: Right;  . KNEE ARTHROSCOPY Left 1986  . MASTECTOMY WITH AXILLARY LYMPH NODE DISSECTION Left 2000   W/  RECONSTRUCTION  . NASAL SINUS SURGERY  1980s  . ROTATOR CUFF REPAIR Right 1985  . TONSILLECTOMY  child  . TRANSURETHRAL RESECTION OF BLADDER TUMOR N/A 03/21/2020   Procedure: TRANSURETHRAL RESECTION OF BLADDER TUMOR (TURBT);  Surgeon: Remi Haggard, MD;  Location: [facility];  Service: Urology;  Laterality: N/A;  30 MINS    There were no vitals filed for this visit.  Subjective Assessment - 09/14/20 1233    Subjective I felt good after last session.    Pertinent History hx of breast and bladder cancer, hysterectomy, ant/post wall repair,TRAM flap surgery; daughter getting married in [place] in July    Currently in Pain? No/denies                             [facility] Adult PT Treatment/Exercise - 09/14/20 0001      Lumbar Exercises: Stretches   Piriformis Stretch 2 reps;20 seconds;Left;Right    Piriformis Stretch Limitations seated      Lumbar Exercises: Aerobic   Nustep Level 2 x 8 minutes- PT present to discuss progress      Lumbar Exercises: Standing   Other Standing Lumbar Exercises dead lift to  stool: 10# x8 reps      Knee/Hip Exercises: Standing   Heel Raises 20 reps    Heel Raises Limitations standing on foam pad    Rocker Board 3 minutes    Rebounder weight shifting 3 ways x1 min each, heel raises x10    Other Standing Knee Exercises alternating step taps on edge of treadmill 2x10 each    Other Standing Knee Exercises tandem stance on balance pad 2x20 seconds each               Balance Exercises - 09/14/20 0001      Balance Exercises: Standing   Other Standing Exercises box step around discs on the floor, alternating step taps on low cones with sidestepping.  Close contact guard by PT for safety.               PT Short Term Goals - 08/30/20 2023      PT SHORT TERM GOAL #1   Title Pt will report at least 25% less hip pain    Time 4    Period Weeks    Status New    Target Date 09/27/20      PT SHORT TERM GOAL #2   Title Pt will be ind with initial HEP    Time 4    Period Weeks    Status New    Target Date 09/27/20      PT SHORT TERM GOAL #3   Title Pt will improve FGA by at least 3 points for reduced risk of falls    Time 4    Period Weeks    Status New    Target Date 09/27/20             PT Long Term Goals - 08/30/20 2019      PT LONG TERM GOAL #1   Title Pt will improve FGA by at least 6 points for reduced risk of falls    Time 12    Period Weeks    Status New    Target Date 11/21/20      PT LONG TERM GOAL #2   Title Pt will be able to ambulate and up and 4 step in the gym without UE support or deviations due to improved LE strength    Time 12    Period Weeks    Status New    Target Date 11/21/20      PT LONG TERM GOAL #3   Title Pt will report at least 60% less hip pain due to improved pain management techniques    Time 12    Period Weeks    Status New  Target Date 11/21/20      PT LONG TERM GOAL #4   Title FOTO score will improve to at least 60 based on outcome prediction    Time 12    Period Weeks    Status New     Target Date 11/21/20                 Plan - 09/14/20 1304    Clinical Impression Statement Pt continues to report decreased hip pain overall and reports 50-60% overall improvement since the start of care. Pt denies any gluteal pain today.   Pt demonstrated improved balance and strategies with balance tasks. Pt continues to work on step-ups at home for general strengthening and function needed for trip to [facility] in July.  PT monitored throughout session to monitor for safety and provide verbal cues for technique.  Pt will continue to benefit from skilled PT to address balance, strength and hip pain.    PT Frequency 2x / week    PT Duration 12 weeks    PT Treatment/Interventions ADLs/Self Care Home Management;Aquatic Therapy;Cryotherapy;Electrical Stimulation;Iontophoresis 4mg /ml Dexamethasone;Moist Heat;Therapeutic activities;Gait training;Traction;Ultrasound;Therapeutic exercise;Neuromuscular re-education;Patient/family education;Manual techniques;Scar mobilization;Taping;Dry needling;Passive range of motion;Stair training    PT Next Visit Plan dynamic balance, hip ROM and distraction; core and hip strength    PT Home Exercise Plan Access Code: [email or web address]    Consulted and Agree with Plan of Care Patient           Patient will benefit from skilled therapeutic intervention in order to improve the following deficits and impairments:  Pain,Postural dysfunction,Impaired flexibility,Increased fascial restricitons,Decreased strength,Abnormal gait,Difficulty walking,Decreased range of motion,Decreased balance  Visit Diagnosis: Difficulty in walking, not elsewhere classified  History of falling  Muscle weakness (generalized)  Pain in left hip     Problem List Patient Active Problem List   Diagnosis Date Noted  . Malignant neoplasm of overlapping sites of bladder ([place]) 08/21/2020  . Bilateral leg edema 03/08/2020  . Mild concentric left ventricular hypertrophy (LVH) 11/25/2019  .  Hyperglycemia 11/25/2019  . DDD (degenerative disc disease), thoracolumbar 11/25/2019  . Diastolic dysfunction without heart failure 11/02/2019  . Enchondroma of finger of right hand 07/22/2019  . Slow transit constipation 07/22/2019  . Venous insufficiency of both lower extremities 01/11/2019  . Abdominal aortic atherosclerosis ([place]) 01/11/2019  . Malignant neoplasm of left female breast ([place]) 01/11/2019  . Cervical radiculopathy 07/07/2017  . Insomnia 01/14/2014  . Mixed hyperlipidemia 12/14/2013  . HTN (hypertension) 12/14/2013  . GERD (gastroesophageal reflux disease) 12/14/2013  . CAD (coronary artery disease) 12/14/2013  . Depression 12/14/2013  . Fibromyalgia 12/14/2013     Sigurd Sos, PT 09/14/20 1:11 PM  [facility] [address] [place], [place], [address] Phone: [phone]   Fax:  [phone]  Name: [name] MRN: [id number] Date of Birth: [birth year]

## 2020-09-19 ENCOUNTER — Ambulatory Visit: Payer: Medicare Other | Admitting: Physical Therapy

## 2020-09-19 ENCOUNTER — Other Ambulatory Visit: Payer: Self-pay

## 2020-09-19 DIAGNOSIS — M6281 Muscle weakness (generalized): Secondary | ICD-10-CM | POA: Diagnosis not present

## 2020-09-19 DIAGNOSIS — M25552 Pain in left hip: Secondary | ICD-10-CM

## 2020-09-19 DIAGNOSIS — R262 Difficulty in walking, not elsewhere classified: Secondary | ICD-10-CM | POA: Diagnosis not present

## 2020-09-19 DIAGNOSIS — Z9181 History of falling: Secondary | ICD-10-CM | POA: Diagnosis not present

## 2020-09-19 NOTE — Therapy (Signed)
[facility] [address] [place], [place], [address] Phone: [phone]   Fax:  [phone]  Physical Therapy Treatment  Patient Details  Name: [name] MRN: [id number] Date of Birth: October 12, 1945 Referring Provider (PT): Martinique, Betty G, MD   Encounter Date: 09/19/2020   PT End of Session - 09/19/20 1619    Visit Number 7    Date for PT Re-Evaluation 11/21/20    Authorization Type medicare A/B    Progress Note Due on Visit 10    PT Start Time 1528    PT Stop Time 1610    PT Time Calculation (min) 42 min    Activity Tolerance Patient tolerated treatment well           Past Medical History:  Diagnosis Date  . Bilateral lower extremity edema    per pt left ankle greater than right  . Bladder tumor   . CAD (coronary artery disease)    cardiac cath 01/ 2005 by dr Terrence Dupont,  non-obstructive cad;  nuclear study 07-31-2016  normal with no evidence ischemia,nuclear ef 52%  . Diverticulosis of colon   . Fibromyalgia   . GERD (gastroesophageal reflux disease)   . Hematuria   . Hemorrhoids   . Hiatal hernia   . History of cancer chemotherapy 2000   left breast cancer  . History of diverticulitis of colon   . History of gastritis   . History of kidney stones   . History of left breast cancer 2000   dx 2000 and s/p left mastectomy w/ node dissection;  completed chemo and radiation therapy same year 2000 and completed anti-estrogen 2005;  per pt released from oncologist 2006 and no recurrence  . Hyperlipidemia   . Hypertension    followed by pcp  . Lower urinary tract symptoms (LUTS)    freq/ urg/ nocturia  . Personal history of radiation therapy 2000   left breast cancer  . Wears glasses     Past Surgical History:  Procedure Laterality Date  . ABDOMINAL HYSTERECTOMY  1978   AND LYSIS ADHESIONS /  APPENDECTOMY  (still has ovaries)  . ANTERIOR AND POSTERIOR REPAIR WITH SACROSPINOUS FIXATION   07-11-2008  @[facility]    AND ENTEROCELE REPAIR/ UTEROSACRAL COLPOSUSPENSION  . BREAST REDUCTION SURGERY Right 2000  . CARDIAC CATHETERIZATION  05-10-2003   dr Terrence Dupont   mild non-obstructive cad,  ef 55-60%  . CATARACT EXTRACTION W/ INTRAOCULAR LENS  IMPLANT, BILATERAL  2015  . CYST EXCISION  02-28-2020  @[facility]    right axilla  . CYSTOSCOPY W/ RETROGRADES Bilateral 03/21/2020   Procedure: CYSTOSCOPY WITH RETROGRADE PYELOGRAM;  Surgeon: Remi Haggard, MD;  Location: [facility];  Service: Urology;  Laterality: Bilateral;  . INGUINAL HERNIA REPAIR  09/27/2011   Procedure: HERNIA REPAIR INGUINAL ADULT;  Surgeon: Gwenyth Ober, MD;  Location: [place];  Service: General;  Laterality: Right;  . KNEE ARTHROSCOPY Left 1986  . MASTECTOMY WITH AXILLARY LYMPH NODE DISSECTION Left 2000   W/  RECONSTRUCTION  . NASAL SINUS SURGERY  1980s  . ROTATOR CUFF REPAIR Right 1985  . TONSILLECTOMY  child  . TRANSURETHRAL RESECTION OF BLADDER TUMOR N/A 03/21/2020   Procedure: TRANSURETHRAL RESECTION OF BLADDER TUMOR (TURBT);  Surgeon: Remi Haggard, MD;  Location: [facility];  Service: Urology;  Laterality: N/A;  30 MINS    There were no vitals filed for this visit.   Subjective Assessment - 09/19/20 1526  Subjective Had a family reunion this weekend.  Now with sinus infection.  I have some right Upper trap region.    Pertinent History hx of breast and bladder cancer, hysterectomy, ant/post wall repair,TRAM flap surgery; daughter getting married in [place] in July    Patient Stated Goals get up/down steps and walking for exercise    Currently in Pain? No/denies    Pain Score 0-No pain                             [facility] Adult PT Treatment/Exercise - 09/19/20 0001      Lumbar Exercises: Aerobic   Nustep Level 2 x 10 minutes- PT present to discuss progress/status      Lumbar Exercises: Standing   Other Standing Lumbar Exercises dynamic warm up: high step,  straight leg; walking heel raise, backscratcher, touchdown 5-7 reps each    Other Standing Lumbar Exercises dead lift to between mid shin: 10# x10 reps      Knee/Hip Exercises: Standing   Lateral Step Up Right;Left;1 set;5 reps;Hand Hold: 1;Step Height: 4"    Rebounder weight shifting 3 ways x1 min each, heel raises x10    Other Standing Knee Exercises alternating step taps 10x with min UE support    Other Standing Knee Exercises farmer's carry 2 5# kettlebell weights      Knee/Hip Exercises: Seated   Sit to Sand 10 reps   holding 2 5# kettlebells                   PT Short Term Goals - 09/19/20 1623      PT SHORT TERM GOAL #1   Title Pt will report at least 25% less hip pain    Status Achieved      PT SHORT TERM GOAL #2   Title Pt will be ind with initial HEP    Time 4    Period Weeks    Status On-going      PT SHORT TERM GOAL #3   Title Pt will improve FGA by at least 3 points for reduced risk of falls    Time 4    Period Weeks    Status On-going             PT Long Term Goals - 08/30/20 2019      PT LONG TERM GOAL #1   Title Pt will improve FGA by at least 6 points for reduced risk of falls    Time 12    Period Weeks    Status New    Target Date 11/21/20      PT LONG TERM GOAL #2   Title Pt will be able to ambulate and up and 4 step in the gym without UE support or deviations due to improved LE strength    Time 12    Period Weeks    Status New    Target Date 11/21/20      PT LONG TERM GOAL #3   Title Pt will report at least 60% less hip pain due to improved pain management techniques    Time 12    Period Weeks    Status New    Target Date 11/21/20      PT LONG TERM GOAL #4   Title FOTO score will improve to at least 60 based on outcome prediction    Time 12    Period Weeks    Status New  Target Date 11/21/20                 Plan - 09/19/20 1619    Clinical Impression Statement The patient is able to peform a progression of LE  strengthening and dynamic balance ex's.  Difficulty stabilizing on 1 leg with high knees, kicks and step taps requiring UE use to steady.  Therapist also providing close supervision for safety.  She denies pain during session but reports significant perceived exertion level of 9 1/2 out of 10.  On track with rehab goals.    Comorbidities chronic left hip, hysterectomy, ant/post repair of prolapse, TRAM flap    Rehab Potential Excellent    PT Frequency 2x / week    PT Duration 12 weeks    PT Treatment/Interventions ADLs/Self Care Home Management;Aquatic Therapy;Cryotherapy;Electrical Stimulation;Iontophoresis 4mg /ml Dexamethasone;Moist Heat;Therapeutic activities;Gait training;Traction;Ultrasound;Therapeutic exercise;Neuromuscular re-education;Patient/family education;Manual techniques;Scar mobilization;Taping;Dry needling;Passive range of motion;Stair training    PT Next Visit Plan dynamic balance, hip ROM and distraction; core and hip strength    PT Home Exercise Plan Access Code: [email or web address]           Patient will benefit from skilled therapeutic intervention in order to improve the following deficits and impairments:  Pain,Postural dysfunction,Impaired flexibility,Increased fascial restricitons,Decreased strength,Abnormal gait,Difficulty walking,Decreased range of motion,Decreased balance  Visit Diagnosis: Difficulty in walking, not elsewhere classified  History of falling  Muscle weakness (generalized)  Pain in left hip     Problem List Patient Active Problem List   Diagnosis Date Noted  . Malignant neoplasm of overlapping sites of bladder ([place]) 08/21/2020  . Bilateral leg edema 03/08/2020  . Mild concentric left ventricular hypertrophy (LVH) 11/25/2019  . Hyperglycemia 11/25/2019  . DDD (degenerative disc disease), thoracolumbar 11/25/2019  . Diastolic dysfunction without heart failure 11/02/2019  . Enchondroma of finger of right hand 07/22/2019  . Slow transit constipation  07/22/2019  . Venous insufficiency of both lower extremities 01/11/2019  . Abdominal aortic atherosclerosis ([place]) 01/11/2019  . Malignant neoplasm of left female breast ([place]) 01/11/2019  . Cervical radiculopathy 07/07/2017  . Insomnia 01/14/2014  . Mixed hyperlipidemia 12/14/2013  . HTN (hypertension) 12/14/2013  . GERD (gastroesophageal reflux disease) 12/14/2013  . CAD (coronary artery disease) 12/14/2013  . Depression 12/14/2013  . Fibromyalgia 12/14/2013   Ruben Im, PT 09/19/20 4:25 PM Phone: [phone] Fax: [phone] Alvera Singh 09/19/2020, 4:25 PM   [facility] [address] [place], [place], [address] Phone: [phone]   Fax:  [phone]  Name: KEYERRA LAMERE MRN: [id number] Date of Birth: [birth year]

## 2020-09-21 ENCOUNTER — Encounter: Payer: Medicare Other | Admitting: Physical Therapy

## 2020-09-26 ENCOUNTER — Other Ambulatory Visit: Payer: Self-pay

## 2020-09-26 ENCOUNTER — Ambulatory Visit: Payer: Medicare Other | Attending: Family Medicine

## 2020-09-26 DIAGNOSIS — Z9181 History of falling: Secondary | ICD-10-CM | POA: Diagnosis not present

## 2020-09-26 DIAGNOSIS — M25552 Pain in left hip: Secondary | ICD-10-CM | POA: Diagnosis not present

## 2020-09-26 DIAGNOSIS — R262 Difficulty in walking, not elsewhere classified: Secondary | ICD-10-CM | POA: Insufficient documentation

## 2020-09-26 DIAGNOSIS — M6281 Muscle weakness (generalized): Secondary | ICD-10-CM | POA: Diagnosis not present

## 2020-09-26 NOTE — Therapy (Signed)
[facility] [address], [place], [place], [address] Phone: [phone]   Fax:  [phone]  Physical Therapy Treatment  Patient Details  Name: [name] MRN: [id number] Date of Birth: 1945-05-31 Referring Provider (PT): Martinique, Betty G, MD   Encounter Date: 09/26/2020   PT End of Session - 09/26/20 1220    Visit Number 8    Date for PT Re-Evaluation 11/21/20    Authorization Type medicare A/B- KX at 15    Progress Note Due on Visit 10    PT Start Time 0938    PT Stop Time 1226    PT Time Calculation (min) 41 min    Activity Tolerance Patient tolerated treatment well    Behavior During Therapy [facility] for tasks assessed/performed           Past Medical History:  Diagnosis Date  . Bilateral lower extremity edema    per pt left ankle greater than right  . Bladder tumor   . CAD (coronary artery disease)    cardiac cath 01/ 2005 by dr Terrence Dupont,  non-obstructive cad;  nuclear study 07-31-2016  normal with no evidence ischemia,nuclear ef 52%  . Diverticulosis of colon   . Fibromyalgia   . GERD (gastroesophageal reflux disease)   . Hematuria   . Hemorrhoids   . Hiatal hernia   . History of cancer chemotherapy 2000   left breast cancer  . History of diverticulitis of colon   . History of gastritis   . History of kidney stones   . History of left breast cancer 2000   dx 2000 and s/p left mastectomy w/ node dissection;  completed chemo and radiation therapy same year 2000 and completed anti-estrogen 2005;  per pt released from oncologist 2006 and no recurrence  . Hyperlipidemia   . Hypertension    followed by pcp  . Lower urinary tract symptoms (LUTS)    freq/ urg/ nocturia  . Personal history of radiation therapy 2000   left breast cancer  . Wears glasses     Past Surgical History:  Procedure Laterality Date  . ABDOMINAL HYSTERECTOMY  1978   AND LYSIS ADHESIONS /  APPENDECTOMY  (still has ovaries)   . ANTERIOR AND POSTERIOR REPAIR WITH SACROSPINOUS FIXATION  07-11-2008  @[facility]    AND ENTEROCELE REPAIR/ UTEROSACRAL COLPOSUSPENSION  . BREAST REDUCTION SURGERY Right 2000  . CARDIAC CATHETERIZATION  05-10-2003   dr Terrence Dupont   mild non-obstructive cad,  ef 55-60%  . CATARACT EXTRACTION W/ INTRAOCULAR LENS  IMPLANT, BILATERAL  2015  . CYST EXCISION  02-28-2020  @[facility]    right axilla  . CYSTOSCOPY W/ RETROGRADES Bilateral 03/21/2020   Procedure: CYSTOSCOPY WITH RETROGRADE PYELOGRAM;  Surgeon: Remi Haggard, MD;  Location: [facility];  Service: Urology;  Laterality: Bilateral;  . INGUINAL HERNIA REPAIR  09/27/2011   Procedure: HERNIA REPAIR INGUINAL ADULT;  Surgeon: Gwenyth Ober, MD;  Location: [place] [facility];  Service: General;  Laterality: Right;  . KNEE ARTHROSCOPY Left 1986  . MASTECTOMY WITH AXILLARY LYMPH NODE DISSECTION Left 2000   W/  RECONSTRUCTION  . NASAL SINUS SURGERY  1980s  . ROTATOR CUFF REPAIR Right 1985  . TONSILLECTOMY  child  . TRANSURETHRAL RESECTION OF BLADDER TUMOR N/A 03/21/2020   Procedure: TRANSURETHRAL RESECTION OF BLADDER TUMOR (TURBT);  Surgeon: Remi Haggard, MD;  Location: [facility];  Service: Urology;  Laterality: N/A;  30 MINS    There were no vitals filed  for this visit.   Subjective Assessment - 09/26/20 1147    Subjective I had a few days where I have felt good.  I feel like things are improving-70% overall since the start of care.    Currently in Pain? No/denies                             [facility] Adult PT Treatment/Exercise - 09/26/20 0001      Lumbar Exercises: Aerobic   Nustep Level 2 x 10 minutes- PT present to discuss progress/status      Lumbar Exercises: Standing   Other Standing Lumbar Exercises dead lift to between mid shin: 10# x15reps      Knee/Hip Exercises: Standing   Heel Raises 20 reps    Heel Raises Limitations standing on foam pad    Lateral Step Up Right;Left;1 set;Hand Hold: 1;Step  Height: 2";20 reps    Lateral Step Up Limitations using foam pad    Rebounder weight shifting 3 ways x1 min each, heel raises x10    Other Standing Knee Exercises golfer's lift with 1 UE support to tap opposite hand on stool with yoga block on topx 5 each- fatigue with this    Other Standing Knee Exercises farmer's carry 2 5#  weightsx 50 feet                    PT Short Term Goals - 09/19/20 1623      PT SHORT TERM GOAL #1   Title Pt will report at least 25% less hip pain    Status Achieved      PT SHORT TERM GOAL #2   Title Pt will be ind with initial HEP    Time 4    Period Weeks    Status On-going      PT SHORT TERM GOAL #3   Title Pt will improve FGA by at least 3 points for reduced risk of falls    Time 4    Period Weeks    Status On-going             PT Long Term Goals - 08/30/20 2019      PT LONG TERM GOAL #1   Title Pt will improve FGA by at least 6 points for reduced risk of falls    Time 12    Period Weeks    Status New    Target Date 11/21/20      PT LONG TERM GOAL #2   Title Pt will be able to ambulate and up and 4 step in the gym without UE support or deviations due to improved LE strength    Time 12    Period Weeks    Status New    Target Date 11/21/20      PT LONG TERM GOAL #3   Title Pt will report at least 60% less hip pain due to improved pain management techniques    Time 12    Period Weeks    Status New    Target Date 11/21/20      PT LONG TERM GOAL #4   Title FOTO score will improve to at least 60 based on outcome prediction    Time 12    Period Weeks    Status New    Target Date 11/21/20                 Plan - 09/26/20 1218  Clinical Impression Statement The patient is able to perform a progression of LE strengthening and dynamic balance ex's.  Pt was fatigued with single leg golfer's lift and required rest break after this activity.  Pt required tactile cues for pelvic alignment and to activate qlute med.   Therapist also provided close supervision for safety throughout session.  Overall 70% overall improvement in Lt LE pain and pt denies any pain during session today.  Pt will continue to benefit from skilled PT to address LE strength, balance, flexibility and Lt LE pain.    PT Frequency 2x / week    PT Duration 12 weeks    PT Treatment/Interventions ADLs/Self Care Home Management;Aquatic Therapy;Cryotherapy;Electrical Stimulation;Iontophoresis 4mg /ml Dexamethasone;Moist Heat;Therapeutic activities;Gait training;Traction;Ultrasound;Therapeutic exercise;Neuromuscular re-education;Patient/family education;Manual techniques;Scar mobilization;Taping;Dry needling;Passive range of motion;Stair training    PT Next Visit Plan dynamic balance, hip ROM and distraction; core and hip strength    PT Home Exercise Plan Access Code: [email or web address]    Consulted and Agree with Plan of Care Patient           Patient will benefit from skilled therapeutic intervention in order to improve the following deficits and impairments:  Pain,Postural dysfunction,Impaired flexibility,Increased fascial restricitons,Decreased strength,Abnormal gait,Difficulty walking,Decreased range of motion,Decreased balance  Visit Diagnosis: Difficulty in walking, not elsewhere classified  History of falling  Muscle weakness (generalized)  Pain in left hip     Problem List Patient Active Problem List   Diagnosis Date Noted  . Malignant neoplasm of overlapping sites of bladder ([place]) 08/21/2020  . Bilateral leg edema 03/08/2020  . Mild concentric left ventricular hypertrophy (LVH) 11/25/2019  . Hyperglycemia 11/25/2019  . DDD (degenerative disc disease), thoracolumbar 11/25/2019  . Diastolic dysfunction without heart failure 11/02/2019  . Enchondroma of finger of right hand 07/22/2019  . Slow transit constipation 07/22/2019  . Venous insufficiency of both lower extremities 01/11/2019  . Abdominal aortic atherosclerosis ([place])  01/11/2019  . Malignant neoplasm of left female breast ([place]) 01/11/2019  . Cervical radiculopathy 07/07/2017  . Insomnia 01/14/2014  . Mixed hyperlipidemia 12/14/2013  . HTN (hypertension) 12/14/2013  . GERD (gastroesophageal reflux disease) 12/14/2013  . CAD (coronary artery disease) 12/14/2013  . Depression 12/14/2013  . Fibromyalgia 12/14/2013    Sigurd Sos, PT 09/26/20 12:22 PM  [facility] [address] [place], [place], [address] Phone: [phone]   Fax:  [phone]  Name: [name] MRN: [id number] Date of Birth: 1945-11-24

## 2020-09-28 ENCOUNTER — Ambulatory Visit: Payer: Medicare Other

## 2020-09-28 ENCOUNTER — Other Ambulatory Visit: Payer: Self-pay

## 2020-09-28 DIAGNOSIS — M25552 Pain in left hip: Secondary | ICD-10-CM | POA: Diagnosis not present

## 2020-09-28 DIAGNOSIS — M6281 Muscle weakness (generalized): Secondary | ICD-10-CM

## 2020-09-28 DIAGNOSIS — Z9181 History of falling: Secondary | ICD-10-CM

## 2020-09-28 DIAGNOSIS — R262 Difficulty in walking, not elsewhere classified: Secondary | ICD-10-CM | POA: Diagnosis not present

## 2020-09-28 NOTE — Therapy (Signed)
[facility] [address], [place], [place], [address] Phone: [phone]   Fax:  [phone]  Physical Therapy Treatment  Patient Details  Name: [name] MRN: [id number] Date of Birth: 04-Dec-1945 Referring Provider (PT): Martinique, Betty G, MD   Encounter Date: 09/28/2020   PT End of Session - 09/28/20 1224     Visit Number 9    Date for PT Re-Evaluation 11/21/20    Authorization Type medicare A/B- KX at 15    Progress Note Due on Visit 10    PT Start Time 1147    PT Stop Time 1227    PT Time Calculation (min) 40 min    Activity Tolerance Patient tolerated treatment well    Behavior During Therapy [facility] for tasks assessed/performed             Past Medical History:  Diagnosis Date   Bilateral lower extremity edema    per pt left ankle greater than right   Bladder tumor    CAD (coronary artery disease)    cardiac cath 01/ 2005 by dr Terrence Dupont,  non-obstructive cad;  nuclear study 07-31-2016  normal with no evidence ischemia,nuclear ef 52%   Diverticulosis of colon    Fibromyalgia    GERD (gastroesophageal reflux disease)    Hematuria    Hemorrhoids    Hiatal hernia    History of cancer chemotherapy 2000   left breast cancer   History of diverticulitis of colon    History of gastritis    History of kidney stones    History of left breast cancer 2000   dx 2000 and s/p left mastectomy w/ node dissection;  completed chemo and radiation therapy same year 2000 and completed anti-estrogen 2005;  per pt released from oncologist 2006 and no recurrence   Hyperlipidemia    Hypertension    followed by pcp   Lower urinary tract symptoms (LUTS)    freq/ urg/ nocturia   Personal history of radiation therapy 2000   left breast cancer   Wears glasses     Past Surgical History:  Procedure Laterality Date   ABDOMINAL HYSTERECTOMY  1978   AND LYSIS ADHESIONS /  APPENDECTOMY  (still has ovaries)   ANTERIOR AND  POSTERIOR REPAIR WITH SACROSPINOUS FIXATION  07-11-2008  @[facility]    AND ENTEROCELE REPAIR/ UTEROSACRAL COLPOSUSPENSION   BREAST REDUCTION SURGERY Right 2000   CARDIAC CATHETERIZATION  05-10-2003   dr Terrence Dupont   mild non-obstructive cad,  ef 55-60%   CATARACT EXTRACTION W/ INTRAOCULAR LENS  IMPLANT, BILATERAL  2015   CYST EXCISION  02-28-2020  @[facility]    right axilla   CYSTOSCOPY W/ RETROGRADES Bilateral 03/21/2020   Procedure: CYSTOSCOPY WITH RETROGRADE PYELOGRAM;  Surgeon: Remi Haggard, MD;  Location: [facility];  Service: Urology;  Laterality: Bilateral;   INGUINAL HERNIA REPAIR  09/27/2011   Procedure: HERNIA REPAIR INGUINAL ADULT;  Surgeon: Gwenyth Ober, MD;  Location: [place] [facility];  Service: General;  Laterality: Right;   KNEE ARTHROSCOPY Left 1986   MASTECTOMY WITH AXILLARY LYMPH NODE DISSECTION Left 2000   W/  RECONSTRUCTION   NASAL SINUS SURGERY  1980s   ROTATOR CUFF REPAIR Right 1985   TONSILLECTOMY  child   TRANSURETHRAL RESECTION OF BLADDER TUMOR N/A 03/21/2020   Procedure: TRANSURETHRAL RESECTION OF BLADDER TUMOR (TURBT);  Surgeon: Remi Haggard, MD;  Location: [facility];  Service: Urology;  Laterality: N/A;  30 MINS    There were  no vitals filed for this visit.   Subjective Assessment - 09/28/20 1149     Subjective I am feeling good.  I was a little bit tired after last session but not too bad.  I took a nap.    Currently in Pain? No/denies                               [facility] Adult PT Treatment/Exercise - 09/28/20 0001       Lumbar Exercises: Aerobic   Nustep Level 2 x 10 minutes- PT present to discuss progress/status      Knee/Hip Exercises: Standing   Heel Raises 20 reps    Heel Raises Limitations standing on foam pad    Lateral Step Up Right;Left;1 set;Hand Hold: 1;Step Height: 2";20 reps    Lateral Step Up Limitations using foam pad    Rocker Board 3 minutes    Rebounder weight shifting 3 ways x1 min each, heel  raises x10    Other Standing Knee Exercises farmer's carry 2 5#  weightsx 50 feet                 Balance Exercises - 09/28/20 0001       Balance Exercises: Standing   Step Over Hurdles / Cones 4x 4 hurdles with gait belt    Other Standing Exercises box step around discs on the floor, alternating step taps on low cones with sidestepping.  Close contact guard by PT for safety.                 PT Short Term Goals - 09/19/20 1623       PT SHORT TERM GOAL #1   Title Pt will report at least 25% less hip pain    Status Achieved      PT SHORT TERM GOAL #2   Title Pt will be ind with initial HEP    Time 4    Period Weeks    Status On-going      PT SHORT TERM GOAL #3   Title Pt will improve FGA by at least 3 points for reduced risk of falls    Time 4    Period Weeks    Status On-going               PT Long Term Goals - 08/30/20 2019       PT LONG TERM GOAL #1   Title Pt will improve FGA by at least 6 points for reduced risk of falls    Time 12    Period Weeks    Status New    Target Date 11/21/20      PT LONG TERM GOAL #2   Title Pt will be able to ambulate and up and 4 step in the gym without UE support or deviations due to improved LE strength    Time 12    Period Weeks    Status New    Target Date 11/21/20      PT LONG TERM GOAL #3   Title Pt will report at least 60% less hip pain due to improved pain management techniques    Time 12    Period Weeks    Status New    Target Date 11/21/20      PT LONG TERM GOAL #4   Title FOTO score will improve to at least 60 based on outcome prediction    Time 12  Period Weeks    Status New    Target Date 11/21/20                   Plan - 09/28/20 1156     Clinical Impression Statement The patient is able to perform a progression of LE strengthening and dynamic balance ex's.   Pt required tactile cues for pelvic alignment and to activate qlute med with fatigue during standing exercises.   Therapist also provided close supervision for safety throughout session.  Pt tolerated increased challenge with balance exercises performed today and CGA and close supervision for safety.  Pt will continue to benefit from skilled PT to address LE strength, balance, flexibility and Lt LE pain.    PT Frequency 2x / week    PT Duration 12 weeks    PT Treatment/Interventions ADLs/Self Care Home Management;Aquatic Therapy;Cryotherapy;Electrical Stimulation;Iontophoresis 4mg /ml Dexamethasone;Moist Heat;Therapeutic activities;Gait training;Traction;Ultrasound;Therapeutic exercise;Neuromuscular re-education;Patient/family education;Manual techniques;Scar mobilization;Taping;Dry needling;Passive range of motion;Stair training    PT Next Visit Plan dynamic balance, hip ROM and distraction; core and hip strength    PT Home Exercise Plan Access Code: [email or web address]    Consulted and Agree with Plan of Care Patient             Patient will benefit from skilled therapeutic intervention in order to improve the following deficits and impairments:  Pain, Postural dysfunction, Impaired flexibility, Increased fascial restricitons, Decreased strength, Abnormal gait, Difficulty walking, Decreased range of motion, Decreased balance  Visit Diagnosis: Difficulty in walking, not elsewhere classified  History of falling  Muscle weakness (generalized)  Pain in left hip     Problem List Patient Active Problem List   Diagnosis Date Noted   Malignant neoplasm of overlapping sites of bladder ([place]) 08/21/2020   Bilateral leg edema 03/08/2020   Mild concentric left ventricular hypertrophy (LVH) 11/25/2019   Hyperglycemia 11/25/2019   DDD (degenerative disc disease), thoracolumbar 16/01/9603   Diastolic dysfunction without heart failure 11/02/2019   Enchondroma of finger of right hand 07/22/2019   Slow transit constipation 07/22/2019   Venous insufficiency of both lower extremities 01/11/2019   Abdominal aortic  atherosclerosis ([place]) 01/11/2019   Malignant neoplasm of left female breast ([place]) 01/11/2019   Cervical radiculopathy 07/07/2017   Insomnia 01/14/2014   Mixed hyperlipidemia 12/14/2013   HTN (hypertension) 12/14/2013   GERD (gastroesophageal reflux disease) 12/14/2013   CAD (coronary artery disease) 12/14/2013   Depression 12/14/2013   Fibromyalgia 12/14/2013     Sigurd Sos, PT 09/28/20 12:27 PM   [facility] [address] [place], [place], [address] Phone: [phone]   Fax:  [phone]  Name: SHRITA THIEN MRN: [id number] Date of Birth: 08-15-1945

## 2020-09-29 DIAGNOSIS — C678 Malignant neoplasm of overlapping sites of bladder: Secondary | ICD-10-CM | POA: Diagnosis not present

## 2020-09-29 DIAGNOSIS — N3946 Mixed incontinence: Secondary | ICD-10-CM | POA: Diagnosis not present

## 2020-10-02 ENCOUNTER — Ambulatory Visit: Payer: Medicare Other

## 2020-10-02 ENCOUNTER — Other Ambulatory Visit: Payer: Self-pay

## 2020-10-02 DIAGNOSIS — M25552 Pain in left hip: Secondary | ICD-10-CM

## 2020-10-02 DIAGNOSIS — Z9181 History of falling: Secondary | ICD-10-CM | POA: Diagnosis not present

## 2020-10-02 DIAGNOSIS — M6281 Muscle weakness (generalized): Secondary | ICD-10-CM

## 2020-10-02 DIAGNOSIS — R262 Difficulty in walking, not elsewhere classified: Secondary | ICD-10-CM | POA: Diagnosis not present

## 2020-10-02 NOTE — Therapy (Addendum)
[facility] [address], [place], [place], [address] Phone: [phone]   Fax:  [phone]  Physical Therapy Treatment  Patient Details  Name: [name] MRN: [id number] Date of Birth: 07-24-1945 Referring Provider (PT): Martinique, Betty G, MD   Encounter Date: 10/02/2020 Progress Note Reporting Period 08/29/20 to 10/02/20  See note below for Objective Data and Assessment of Progress/Goals.      PT End of Session - 10/02/20 1311     Visit Number 10    Date for PT Re-Evaluation 11/21/20    Authorization Type medicare A/B- KX at 15    Progress Note Due on Visit 20    PT Start Time 1231    PT Stop Time 1314    PT Time Calculation (min) 43 min    Activity Tolerance Patient tolerated treatment well    Behavior During Therapy [facility] for tasks assessed/performed             Past Medical History:  Diagnosis Date   Bilateral lower extremity edema    per pt left ankle greater than right   Bladder tumor    CAD (coronary artery disease)    cardiac cath 01/ 2005 by dr Terrence Dupont,  non-obstructive cad;  nuclear study 07-31-2016  normal with no evidence ischemia,nuclear ef 52%   Diverticulosis of colon    Fibromyalgia    GERD (gastroesophageal reflux disease)    Hematuria    Hemorrhoids    Hiatal hernia    History of cancer chemotherapy 2000   left breast cancer   History of diverticulitis of colon    History of gastritis    History of kidney stones    History of left breast cancer 2000   dx 2000 and s/p left mastectomy w/ node dissection;  completed chemo and radiation therapy same year 2000 and completed anti-estrogen 2005;  per pt released from oncologist 2006 and no recurrence   Hyperlipidemia    Hypertension    followed by pcp   Lower urinary tract symptoms (LUTS)    freq/ urg/ nocturia   Personal history of radiation therapy 2000   left breast cancer   Wears glasses     Past Surgical History:   Procedure Laterality Date   ABDOMINAL HYSTERECTOMY  1978   AND LYSIS ADHESIONS /  APPENDECTOMY  (still has ovaries)   ANTERIOR AND POSTERIOR REPAIR WITH SACROSPINOUS FIXATION  07-11-2008  @[facility]    AND ENTEROCELE REPAIR/ UTEROSACRAL COLPOSUSPENSION   BREAST REDUCTION SURGERY Right 2000   CARDIAC CATHETERIZATION  05-10-2003   dr Terrence Dupont   mild non-obstructive cad,  ef 55-60%   CATARACT EXTRACTION W/ INTRAOCULAR LENS  IMPLANT, BILATERAL  2015   CYST EXCISION  02-28-2020  @[facility]    right axilla   CYSTOSCOPY W/ RETROGRADES Bilateral 03/21/2020   Procedure: CYSTOSCOPY WITH RETROGRADE PYELOGRAM;  Surgeon: Remi Haggard, MD;  Location: [facility];  Service: Urology;  Laterality: Bilateral;   INGUINAL HERNIA REPAIR  09/27/2011   Procedure: HERNIA REPAIR INGUINAL ADULT;  Surgeon: Gwenyth Ober, MD;  Location: [place];  Service: General;  Laterality: Right;   KNEE ARTHROSCOPY Left 1986   MASTECTOMY WITH AXILLARY LYMPH NODE DISSECTION Left 2000   W/  RECONSTRUCTION   NASAL SINUS SURGERY  1980s   ROTATOR CUFF REPAIR Right 1985   TONSILLECTOMY  child   TRANSURETHRAL RESECTION OF BLADDER TUMOR N/A 03/21/2020   Procedure: TRANSURETHRAL RESECTION OF BLADDER TUMOR (TURBT);  Surgeon: Remi Haggard,  MD;  Location: [place];  Service: Urology;  Laterality: N/A;  30 MINS    There were no vitals filed for this visit.   Subjective Assessment - 10/02/20 1228     Subjective Some hip pain 2 days ago.  When i did my exercises and took a shower, and it eased off.    Pertinent History hx of breast and bladder cancer, hysterectomy, ant/post wall repair,TRAM flap surgery; daughter getting married in [place] in July    Currently in Pain? No/denies                [facility] PT Assessment - 10/02/20 0001       Assessment   Medical Diagnosis M25.552 (ICD-10-CM) - Hip pain, left; W19.Merril Abbe (ICD-10-CM) - Fall, initial encounter    Referring Provider (PT) Martinique, Betty G, MD     Onset Date/Surgical Date --   ongoing     Prior Function   Level of Independence Independent      Cognition   Overall Cognitive Status Within Functional Limits for tasks assessed      Observation/Other Assessments   Focus on Therapeutic Outcomes (FOTO)  43      Posture/Postural Control   Posture/Postural Control Postural limitations    Postural Limitations Increased lumbar lordosis;Rounded Shoulders;Forward head;Weight shift left      Strength   Left Hip Flexion 4/5    Left Hip Extension 4+/5            FOTO 43    [facility] Adult PT Treatment/Exercise - 09/28/20 0001                Lumbar Exercises: Aerobic    Nustep Level 2 x 10 minutes- PT present to discuss progress/status         Knee/Hip Exercises: Standing    Heel Raises 20 reps    Heel Raises Limitations standing on foam pad    Lateral Step Up Right;Left;1 set;Hand Hold: 1;Step Height: 2";20 reps    Lateral Step Up Limitations using foam pad    Rocker Board 3 minutes    Rebounder weight shifting 3 ways x1 min each, heel raises x10    Other Standing Knee Exercises farmer's carry 2 5#  weightsx 50 feet                          Balance Exercises - 09/28/20 0001                Balance Exercises: Standing    Step Over Hurdles / Cones 4x 4 hurdles with gait belt    Other Standing Exercises box step around discs on the floor, alternating step taps on low cones with sidestepping.  Close contact guard by PT for safety.                                PT Short Term Goals - 09/19/20 1623       PT SHORT TERM GOAL #1   Title Pt will report at least 25% less hip pain    Status Achieved      PT SHORT TERM GOAL #2   Title Pt will be ind with initial HEP    Time 4    Period Weeks    Status On-going      PT SHORT TERM GOAL #3   Title Pt will improve FGA by at least 3 points  for reduced risk of falls    Time 4    Period Weeks    Status On-going               PT Long Term  Goals - 10/02/20 1232       PT LONG TERM GOAL #1   Title Pt will improve FGA by at least 6 points for reduced risk of falls    Status On-going      PT LONG TERM GOAL #2   Title Pt will be able to ambulate and up and down  4 step in the gym without UE support or deviations due to improved LE strength    Baseline minimal gait deviations    Status On-going      PT LONG TERM GOAL #3   Title Pt will report at least 60% less hip pain due to improved pain management techniques    Baseline 40% better    Time 12    Period Weeks    Status On-going      PT LONG TERM GOAL #4   Title FOTO score will improve to at least 60 based on outcome prediction    Baseline 43    Status On-going                   Plan - 10/02/20 1255     Clinical Impression Statement Pt reports 40% reduction in hip pain since the start of care and continues to work on balance.  FOTO score is improved from 37 to 43 indicating improved function.  Pt demonstrates improved Lt hip flexor strength and DF strength and demonstrates improved symmetry with gait on level surface.  Minimal [place] that becomes moderate with fatigue or when carrying weight.  Pt is able to tolerate more advanced balance tasks in the clinic.  PT provides stand by assistance to contact guard assistance for safety and to provide cueing.  Pt will continue to benefit from skilled PT to address strength, flexibility and balance tasks to improve safety and independence at home and in the community.    PT Treatment/Interventions ADLs/Self Care Home Management;Aquatic Therapy;Cryotherapy;Electrical Stimulation;Iontophoresis 4mg /ml Dexamethasone;Moist Heat;Therapeutic activities;Gait training;Traction;Ultrasound;Therapeutic exercise;Neuromuscular re-education;Patient/family education;Manual techniques;Scar mobilization;Taping;Dry needling;Passive range of motion;Stair training    PT Next Visit Plan dynamic balance, hip ROM and distraction; core and hip  strength    PT Home Exercise Plan Access Code: [email or web address]    Consulted and Agree with Plan of Care Patient             Patient will benefit from skilled therapeutic intervention in order to improve the following deficits and impairments:  Pain, Postural dysfunction, Impaired flexibility, Increased fascial restricitons, Decreased strength, Abnormal gait, Difficulty walking, Decreased range of motion, Decreased balance  Visit Diagnosis: Difficulty in walking, not elsewhere classified  History of falling  Muscle weakness (generalized)  Pain in left hip     Problem List Patient Active Problem List   Diagnosis Date Noted   Malignant neoplasm of overlapping sites of bladder ([place]) 08/21/2020   Bilateral leg edema 03/08/2020   Mild concentric left ventricular hypertrophy (LVH) 11/25/2019   Hyperglycemia 11/25/2019   DDD (degenerative disc disease), thoracolumbar 02/58/5277   Diastolic dysfunction without heart failure 11/02/2019   Enchondroma of finger of right hand 07/22/2019   Slow transit constipation 07/22/2019   Venous insufficiency of both lower extremities 01/11/2019   Abdominal aortic atherosclerosis ([place]) 01/11/2019   Malignant neoplasm of left female breast ([place]) 01/11/2019   Cervical radiculopathy 07/07/2017  Insomnia 01/14/2014   Mixed hyperlipidemia 12/14/2013   HTN (hypertension) 12/14/2013   GERD (gastroesophageal reflux disease) 12/14/2013   CAD (coronary artery disease) 12/14/2013   Depression 12/14/2013   Fibromyalgia 12/14/2013    Sigurd Sos, PT 10/02/20 1:21 PM   [facility] [address], [place], [place], [address] Phone: [phone]   Fax:  [phone]  Name: HAADIYA FROGGE MRN: [id number] Date of Birth: Jan 10, 1946

## 2020-10-03 ENCOUNTER — Ambulatory Visit (INDEPENDENT_AMBULATORY_CARE_PROVIDER_SITE_OTHER): Payer: Medicare Other

## 2020-10-03 DIAGNOSIS — Z0000 Encounter for general adult medical examination without abnormal findings: Secondary | ICD-10-CM | POA: Diagnosis not present

## 2020-10-03 NOTE — Patient Instructions (Signed)
[name] , Thank you for taking time to come for your Medicare Wellness Visit. I appreciate your ongoing commitment to your health goals. Please review the following plan we discussed and let me know if I can assist you in the future.   Screening recommendations/referrals: Colonoscopy: 02/16/2018  due 2024 Mammogram: due 01/13/2021 Bone Density: current 05/07/2018 Recommended yearly ophthalmology/optometry visit for glaucoma screening and checkup Recommended yearly dental visit for hygiene and checkup  Vaccinations: Influenza vaccine: due in fall 2022 Pneumococcal vaccine: completed series  Tdap vaccine: due upon injury  Shingles vaccine: will obtain local pharmacy    Advanced directives: will pick up packet from the front desk   Conditions/risks identified: none   Next appointment: none    Preventive Care 65 Years and Older, Female Preventive care refers to lifestyle choices and visits with your health care provider that can promote health and wellness. What does preventive care include? A yearly physical exam. This is also called an annual well check. Dental exams once or twice a year. Routine eye exams. Ask your health care provider how often you should have your eyes checked. Personal lifestyle choices, including: Daily care of your teeth and gums. Regular physical activity. Eating a healthy diet. Avoiding tobacco and drug use. Limiting alcohol use. Practicing safe sex. Taking low-dose aspirin every day. Taking vitamin and mineral supplements as recommended by your health care provider. What happens during an annual well check? The services and screenings done by your health care provider during your annual well check will depend on your age, overall health, lifestyle risk factors, and family history of disease. Counseling  Your health care provider may ask you questions about your: Alcohol use. Tobacco use. Drug use. Emotional well-being. Home and relationship  well-being. Sexual activity. Eating habits. History of falls. Memory and ability to understand (cognition). Work and work Statistician. Reproductive health. Screening  You may have the following tests or measurements: Height, weight, and BMI. Blood pressure. Lipid and cholesterol levels. These may be checked every 5 years, or more frequently if you are over 39 years old. Skin check. Lung cancer screening. You may have this screening every year starting at age 37 if you have a 30-pack-year history of smoking and currently smoke or have quit within the past 15 years. Fecal occult blood test (FOBT) of the stool. You may have this test every year starting at age 89. Flexible sigmoidoscopy or colonoscopy. You may have a sigmoidoscopy every 5 years or a colonoscopy every 10 years starting at age 34. Hepatitis C blood test. Hepatitis B blood test. Sexually transmitted disease (STD) testing. Diabetes screening. This is done by checking your blood sugar (glucose) after you have not eaten for a while (fasting). You may have this done every 1-3 years. Bone density scan. This is done to screen for osteoporosis. You may have this done starting at age 45. Mammogram. This may be done every 1-2 years. Talk to your health care provider about how often you should have regular mammograms. Talk with your health care provider about your test results, treatment options, and if necessary, the need for more tests. Vaccines  Your health care provider may recommend certain vaccines, such as: Influenza vaccine. This is recommended every year. Tetanus, diphtheria, and acellular pertussis (Tdap, Td) vaccine. You may need a Td booster every 10 years. Zoster vaccine. You may need this after age 25. Pneumococcal 13-valent conjugate (PCV13) vaccine. One dose is recommended after age 16. Pneumococcal polysaccharide (PPSV23) vaccine. One dose is recommended  after age 62. Talk to your health care provider about which  screenings and vaccines you need and how often you need them. This information is not intended to replace advice given to you by your health care provider. Make sure you discuss any questions you have with your health care provider. Document Released: 05/05/2015 Document Revised: 12/27/2015 Document Reviewed: 02/07/2015 Elsevier Interactive Patient Education  2017 [employer] Prevention in the Home Falls can cause injuries. They can happen to people of all ages. There are many things you can do to make your home safe and to help prevent falls. What can I do on the outside of my home? Regularly fix the edges of walkways and driveways and fix any cracks. Remove anything that might make you trip as you walk through a door, such as a raised step or threshold. Trim any bushes or trees on the path to your home. Use bright outdoor lighting. Clear any walking paths of anything that might make someone trip, such as rocks or tools. Regularly check to see if handrails are loose or broken. Make sure that both sides of any steps have handrails. Any raised decks and porches should have guardrails on the edges. Have any leaves, snow, or ice cleared regularly. Use sand or salt on walking paths during winter. Clean up any spills in your garage right away. This includes oil or grease spills. What can I do in the bathroom? Use night lights. Install grab bars by the toilet and in the tub and shower. Do not use towel bars as grab bars. Use non-skid mats or decals in the tub or shower. If you need to sit down in the shower, use a plastic, non-slip stool. Keep the floor dry. Clean up any water that spills on the floor as soon as it happens. Remove soap buildup in the tub or shower regularly. Attach bath mats securely with double-sided non-slip rug tape. Do not have throw rugs and other things on the floor that can make you trip. What can I do in the bedroom? Use night lights. Make sure that you have a  light by your bed that is easy to reach. Do not use any sheets or blankets that are too big for your bed. They should not hang down onto the floor. Have a firm chair that has side arms. You can use this for support while you get dressed. Do not have throw rugs and other things on the floor that can make you trip. What can I do in the kitchen? Clean up any spills right away. Avoid walking on wet floors. Keep items that you use a lot in easy-to-reach places. If you need to reach something above you, use a strong step stool that has a grab bar. Keep electrical cords out of the way. Do not use floor polish or wax that makes floors slippery. If you must use wax, use non-skid floor wax. Do not have throw rugs and other things on the floor that can make you trip. What can I do with my stairs? Do not leave any items on the stairs. Make sure that there are handrails on both sides of the stairs and use them. Fix handrails that are broken or loose. Make sure that handrails are as long as the stairways. Check any carpeting to make sure that it is firmly attached to the stairs. Fix any carpet that is loose or worn. Avoid having throw rugs at the top or bottom of the stairs. If you  do have throw rugs, attach them to the floor with carpet tape. Make sure that you have a light switch at the top of the stairs and the bottom of the stairs. If you do not have them, ask someone to add them for you. What else can I do to help prevent falls? Wear shoes that: Do not have high heels. Have rubber bottoms. Are comfortable and fit you well. Are closed at the toe. Do not wear sandals. If you use a stepladder: Make sure that it is fully opened. Do not climb a closed stepladder. Make sure that both sides of the stepladder are locked into place. Ask someone to hold it for you, if possible. Clearly mark and make sure that you can see: Any grab bars or handrails. First and last steps. Where the edge of each step  is. Use tools that help you move around (mobility aids) if they are needed. These include: Canes. Walkers. Scooters. Crutches. Turn on the lights when you go into a dark area. Replace any light bulbs as soon as they burn out. Set up your furniture so you have a clear path. Avoid moving your furniture around. If any of your floors are uneven, fix them. If there are any pets around you, be aware of where they are. Review your medicines with your doctor. Some medicines can make you feel dizzy. This can increase your chance of falling. Ask your doctor what other things that you can do to help prevent falls. This information is not intended to replace advice given to you by your health care provider. Make sure you discuss any questions you have with your health care provider. Document Released: 02/02/2009 Document Revised: 09/14/2015 Document Reviewed: 05/13/2014 Elsevier Interactive Patient Education  2017 [employer].

## 2020-10-03 NOTE — Progress Notes (Signed)
Subjective:   [name] is a 75 y.o. female who presents for an Initial Medicare Annual Wellness Visit.  Virtual Visit via Video Note  I connected with [name] by a video enabled telemedicine application and verified that I am speaking with the correct person using two identifiers.  Location: Patient: Home Provider: Office Persons participating in the virtual visit: patient, provider   I discussed the limitations of evaluation and management by telemedicine and the availability of in person appointments. The patient expressed understanding and agreed to proceed.     [name],,LPN   Review of Systems   n/a       Objective:    There were no vitals filed for this visit. There is no height or weight on file to calculate BMI.  Advanced Directives 08/29/2020 03/21/2020 07/01/2018 01/02/2017 11/19/2016 09/20/2011 09/20/2011  Does Patient Have a Medical Advance Directive? No No No No No Patient does not have advance directive Patient does not have advance directive  Does patient want to make changes to medical advance directive? - - Yes (MAU/Ambulatory/Procedural Areas - Information given) - - - -  Would patient like information on creating a medical advance directive? No - Patient declined Yes (MAU/Ambulatory/Procedural Areas - Information given) - - - - -    Current Medications (verified) Outpatient Encounter Medications as of 10/03/2020  Medication Sig   acetaminophen (TYLENOL) 325 MG tablet Take 650 mg by mouth every 6 (six) hours as needed.   BIOTIN PO Take 1 tablet by mouth daily.   CALCIUM PO Take by mouth.   cetirizine (ZYRTEC) 10 MG tablet Take 10 mg by mouth daily.   Cholecalciferol (VITAMIN D3 PO) Take by mouth.   cyanocobalamin 1000 MCG tablet Take 1,000 mcg by mouth daily.   losartan (COZAAR) 100 MG tablet Take 1 tablet (100 mg total) by mouth at bedtime.   Magnesium 400 MG TABS Take 400 mg by mouth daily.   meclizine (ANTIVERT) 25 MG tablet Take 1  tablet (25 mg total) by mouth 3 (three) times daily as needed for dizziness.   nabumetone (RELAFEN) 500 MG tablet TAKE 1 TABLET(500 MG) BY MOUTH TWICE DAILY AS NEEDED (Patient taking differently: Take 500 mg by mouth 2 (two) times daily as needed.)   pantoprazole (PROTONIX) 40 MG tablet Take 1 tablet (40 mg total) by mouth daily.   potassium chloride SA (KLOR-CON) 20 MEQ tablet TAKE 1 TABLET(20 MEQ) BY MOUTH THREE TIMES DAILY   Probiotic Product (ALIGN PO) Take 1 tablet by mouth daily.    torsemide (DEMADEX) 20 MG tablet Take 1 tablet (20 mg total) by mouth daily.   No facility-administered encounter medications on file as of 10/03/2020.    Allergies (verified) Hydrochlorothiazide, Flomax [tamsulosin], Codeine, Statins, and Zetia [ezetimibe]   History: Past Medical History:  Diagnosis Date   Bilateral lower extremity edema    per pt left ankle greater than right   Bladder tumor    CAD (coronary artery disease)    cardiac cath 01/ 2005 by dr Terrence Dupont,  non-obstructive cad;  nuclear study 07-31-2016  normal with no evidence ischemia,nuclear ef 52%   Diverticulosis of colon    Fibromyalgia    GERD (gastroesophageal reflux disease)    Hematuria    Hemorrhoids    Hiatal hernia    History of cancer chemotherapy 2000   left breast cancer   History of diverticulitis of colon    History of gastritis    History of kidney stones  History of left breast cancer 2000   dx 2000 and s/p left mastectomy w/ node dissection;  completed chemo and radiation therapy same year 2000 and completed anti-estrogen 2005;  per pt released from oncologist 2006 and no recurrence   Hyperlipidemia    Hypertension    followed by pcp   Lower urinary tract symptoms (LUTS)    freq/ urg/ nocturia   Personal history of radiation therapy 2000   left breast cancer   Wears glasses    Past Surgical History:  Procedure Laterality Date   ABDOMINAL HYSTERECTOMY  1978   AND LYSIS ADHESIONS /  APPENDECTOMY  (still has  ovaries)   ANTERIOR AND POSTERIOR REPAIR WITH SACROSPINOUS FIXATION  07-11-2008  @[facility]    AND ENTEROCELE REPAIR/ UTEROSACRAL COLPOSUSPENSION   BREAST REDUCTION SURGERY Right 2000   CARDIAC CATHETERIZATION  05-10-2003   dr Terrence Dupont   mild non-obstructive cad,  ef 55-60%   CATARACT EXTRACTION W/ INTRAOCULAR LENS  IMPLANT, BILATERAL  2015   CYST EXCISION  02-28-2020  @[facility]    right axilla   CYSTOSCOPY W/ RETROGRADES Bilateral 03/21/2020   Procedure: CYSTOSCOPY WITH RETROGRADE PYELOGRAM;  Surgeon: Remi Haggard, MD;  Location: [facility];  Service: Urology;  Laterality: Bilateral;   INGUINAL HERNIA REPAIR  09/27/2011   Procedure: HERNIA REPAIR INGUINAL ADULT;  Surgeon: Gwenyth Ober, MD;  Location: [place];  Service: General;  Laterality: Right;   KNEE ARTHROSCOPY Left 1986   MASTECTOMY WITH AXILLARY LYMPH NODE DISSECTION Left 2000   W/  RECONSTRUCTION   NASAL SINUS SURGERY  1980s   ROTATOR CUFF REPAIR Right 1985   TONSILLECTOMY  child   TRANSURETHRAL RESECTION OF BLADDER TUMOR N/A 03/21/2020   Procedure: TRANSURETHRAL RESECTION OF BLADDER TUMOR (TURBT);  Surgeon: Remi Haggard, MD;  Location: [facility];  Service: Urology;  Laterality: N/A;  31 MINS   Family History  Problem Relation Age of Onset   Alzheimer's disease Mother    Breast cancer Sister 51   Colon polyps Sister    Cancer Son        prostate   Anesthesia problems Neg Hx    Social History   Socioeconomic History   Marital status: Widowed    Spouse name: Not on file   Number of children: Not on file   Years of education: Not on file   Highest education level: Not on file  Occupational History   Not on file  Tobacco Use   Smoking status: Former    Years: 15.00    Pack years: 0.00    Types: Cigarettes    Quit date: 09/20/1998    Years since quitting: 22.0   Smokeless tobacco: Never  Vaping Use   Vaping Use: Never used  Substance and Sexual Activity   Alcohol use: Yes     Alcohol/week: 0.0 standard drinks    Comment: Rare   Drug use: Never   Sexual activity: Not Currently    Birth control/protection: Surgical, Post-menopausal    Comment: 1st intercourse 75 yo-Fewer than 5 partners  Other Topics Concern   Not on file  Social History Narrative   Widowed- husband died of Alzheimers in 2013/02/13.   Social Determinants of Health   Financial Resource Strain: Not on file  Food Insecurity: Not on file  Transportation Needs: Not on file  Physical Activity: Not on file  Stress: Not on file  Social Connections: Not on file    Tobacco Counseling Counseling given: Not Answered  Clinical Intake:                 Diabetic?no          Activities of Daily Living In your present state of health, do you have any difficulty performing the following activities: 03/21/2020 03/08/2020  Hearing? N N  Vision? N N  Difficulty concentrating or making decisions? N N  Walking or climbing stairs? N N  Dressing or bathing? N N  Doing errands, shopping? - N  Some recent data might be hidden    Patient Care Team: Martinique, Betty G, MD as PCP - General (Family Medicine) Croitoru, Dani Gobble, MD as PCP - Cardiology (Cardiology) Fontaine, Belinda Block, MD (Inactive) as Consulting Physician (Gynecology) Magrinat, Virgie Dad, MD as Consulting Physician (Oncology) Juanita Craver, MD as Consulting Physician (Gastroenterology) Charolette Forward, MD as Consulting Physician (Cardiology)  Indicate any recent Medical Services you may have received from other than [facility] providers in the past year (date may be approximate).     Assessment:   This is a routine wellness examination for Brooke.  Hearing/Vision screen No results found.  Dietary issues and exercise activities discussed:     Goals Addressed   None    Depression Screen PHQ 2/9 Scores 10/11/2019 07/01/2018 01/02/2017 02/21/2015  PHQ - 2 Score 0 0 0 0  PHQ- 9 Score - - 0 -    Fall Risk Fall Risk  03/08/2020  07/22/2019 07/21/2018 07/01/2018 03/11/2018  Falls in the past year? 0 0 0 0 0  Comment - - - - Emmi Telephone Survey: data to providers prior to load  Number falls in past yr: 0 - - - -  Injury with Fall? 0 - - - -  Risk for fall due to : No Fall Risks - - - -  Follow up Falls evaluation completed - Falls evaluation completed - -    FALL RISK PREVENTION PERTAINING TO THE HOME:  Any stairs in or around the home? No  If so, are there any without handrails? No  Home free of loose throw rugs in walkways, pet beds, electrical cords, etc? Yes  Adequate lighting in your home to reduce risk of falls? Yes   ASSISTIVE DEVICES UTILIZED TO PREVENT FALLS:  Life alert? No  Use of a cane, walker or w/c? No  Grab bars in the bathroom? No  Shower chair or bench in shower? Yes  Elevated toilet seat or a handicapped toilet? Yes    Cognitive Function: Normal cognitive status assessed by direct observation by this Nurse Health Advisor. No abnormalities found.   MMSE - Mini Mental State Exam 01/02/2017  Orientation to time 5  Orientation to Place 5  Registration 3  Attention/ Calculation 0  Recall 2  Recall-comments pt was unable to recall 1 of 3 words  Language- name 2 objects 0  Language- repeat 1  Language- follow 3 step command 3  Language- read & follow direction 0  Write a sentence 0  Copy design 0  Total score 19        Immunizations Immunization History  Administered Date(s) Administered   Fluad Quad(high Dose 65+) 01/21/2020   Influenza, High Dose Seasonal PF 02/11/2017, 01/19/2018, 12/17/2018   Influenza,inj,Quad PF,6+ Mos 02/23/2013, 02/02/2015, 01/26/2016   PFIZER(Purple Top)SARS-COV-2 Vaccination 05/20/2019, 06/18/2019, 01/20/2020   Pneumococcal Conjugate-13 02/21/2015   Pneumococcal Polysaccharide-23 07/17/2017    TDAP status: Due, Education has been provided regarding the importance of this vaccine. Advised may receive this vaccine at local pharmacy or  Health Dept. Aware  to provide a copy of the vaccination record if obtained from local pharmacy or Health Dept. Verbalized acceptance and understanding.  Flu Vaccine status: Up to date  Pneumococcal vaccine status: Up to date  Covid-19 vaccine status: Completed vaccines  Qualifies for Shingles Vaccine? Yes   Zostavax completed No   Shingrix Completed?: No.    Education has been provided regarding the importance of this vaccine. Patient has been advised to call insurance company to determine out of pocket expense if they have not yet received this vaccine. Advised may also receive vaccine at local pharmacy or Health Dept. Verbalized acceptance and understanding.  Screening Tests Health Maintenance  Topic Date Due   Zoster Vaccines- Shingrix (1 of 2) Never done   COVID-19 Vaccine (4 - Booster for Pfizer series) 04/20/2020   TETANUS/TDAP  01/03/2027 (Originally 05/02/1964)   INFLUENZA VACCINE  11/20/2020   COLONOSCOPY (Pts 45-50yrs Insurance coverage will need to be confirmed)  02/17/2023   DEXA SCAN  Completed   Hepatitis C Screening  Completed   PNA vac Low Risk Adult  Completed   HPV VACCINES  Aged Out    Health Maintenance  Health Maintenance Due  Topic Date Due   Zoster Vaccines- Shingrix (1 of 2) Never done   COVID-19 Vaccine (4 - Booster for Pfizer series) 04/20/2020    Colorectal cancer screening: Type of screening: Colonoscopy. Completed 02/16/2018. Repeat every 5 years  Mammogram status: Completed 01/14/2020. Repeat every year  Bone Density status: Completed 05/07/2018. Results reflect: Bone density results: OSTEOPENIA. Repeat every 5 years.  Lung Cancer Screening: (Low Dose CT Chest recommended if Age 62-80 years, 30 pack-year currently smoking OR have quit w/in 15years.) does not qualify.   Lung Cancer Screening Referral: n/a  Additional Screening:  Hepatitis C Screening: does qualify; Completed 02/21/2015  Vision Screening: Recommended annual ophthalmology exams for early  detection of glaucoma and other disorders of the eye. Is the patient up to date with their annual eye exam?  Yes  Who is the provider or what is the name of the office in which the patient attends annual eye exams? Dr Carolynn Sayers If pt is not established with a provider, would they like to be referred to a provider to establish care? No .   Dental Screening: Recommended annual dental exams for proper oral hygiene  Community Resource Referral / Chronic Care Management: CRR required this visit?  No   CCM required this visit?  No      Plan:     I have personally reviewed and noted the following in the patient's chart:   Medical and social history Use of alcohol, tobacco or illicit drugs  Current medications and supplements including opioid prescriptions. Patient is currently taking opioid prescriptions. Information provided to patient regarding non-opioid alternatives. Patient advised to discuss non-opioid treatment plan with their provider. Functional ability and status Nutritional status Physical activity Advanced directives List of other physicians Hospitalizations, surgeries, and ER visits in previous 12 months Vitals Screenings to include cognitive, depression, and falls Referrals and appointments  In addition, I have reviewed and discussed with patient certain preventive protocols, quality metrics, and best practice recommendations. A written personalized care plan for preventive services as well as general preventive health recommendations were provided to patient.     Randel Pigg, LPN   [id number]   Nurse Notes: none

## 2020-10-05 ENCOUNTER — Ambulatory Visit: Payer: Medicare Other

## 2020-10-05 ENCOUNTER — Other Ambulatory Visit: Payer: Self-pay

## 2020-10-05 DIAGNOSIS — M6281 Muscle weakness (generalized): Secondary | ICD-10-CM | POA: Diagnosis not present

## 2020-10-05 DIAGNOSIS — Z9181 History of falling: Secondary | ICD-10-CM | POA: Diagnosis not present

## 2020-10-05 DIAGNOSIS — M25552 Pain in left hip: Secondary | ICD-10-CM | POA: Diagnosis not present

## 2020-10-05 DIAGNOSIS — R262 Difficulty in walking, not elsewhere classified: Secondary | ICD-10-CM

## 2020-10-05 NOTE — Therapy (Signed)
[facility] [address], [place], [place], [address] Phone: [phone]   Fax:  [phone]  Physical Therapy Treatment  Patient Details  Name: [name] MRN: [id number] Date of Birth: 08/03/1945 Referring Provider (PT): Martinique, Betty G, MD   Encounter Date: 10/05/2020   PT End of Session - 10/05/20 1309     Visit Number 11    Date for PT Re-Evaluation 11/21/20    Authorization Type medicare A/B- KX at 15    Progress Note Due on Visit 20    PT Start Time 1231    PT Stop Time 1313    PT Time Calculation (min) 42 min    Equipment Utilized During Treatment Gait belt    Activity Tolerance Patient tolerated treatment well    Behavior During Therapy [facility] for tasks assessed/performed             Past Medical History:  Diagnosis Date   Bilateral lower extremity edema    per pt left ankle greater than right   Bladder tumor    CAD (coronary artery disease)    cardiac cath 01/ 2005 by dr Terrence Dupont,  non-obstructive cad;  nuclear study 07-31-2016  normal with no evidence ischemia,nuclear ef 52%   Diverticulosis of colon    Fibromyalgia    GERD (gastroesophageal reflux disease)    Hematuria    Hemorrhoids    Hiatal hernia    History of cancer chemotherapy 2000   left breast cancer   History of diverticulitis of colon    History of gastritis    History of kidney stones    History of left breast cancer 2000   dx 2000 and s/p left mastectomy w/ node dissection;  completed chemo and radiation therapy same year 2000 and completed anti-estrogen 2005;  per pt released from oncologist 2006 and no recurrence   Hyperlipidemia    Hypertension    followed by pcp   Lower urinary tract symptoms (LUTS)    freq/ urg/ nocturia   Personal history of radiation therapy 2000   left breast cancer   Wears glasses     Past Surgical History:  Procedure Laterality Date   ABDOMINAL HYSTERECTOMY  1978   AND LYSIS ADHESIONS  /  APPENDECTOMY  (still has ovaries)   ANTERIOR AND POSTERIOR REPAIR WITH SACROSPINOUS FIXATION  07-11-2008  @[facility]    AND ENTEROCELE REPAIR/ UTEROSACRAL COLPOSUSPENSION   BREAST REDUCTION SURGERY Right 2000   CARDIAC CATHETERIZATION  05-10-2003   dr Terrence Dupont   mild non-obstructive cad,  ef 55-60%   CATARACT EXTRACTION W/ INTRAOCULAR LENS  IMPLANT, BILATERAL  2015   CYST EXCISION  02-28-2020  @[facility]    right axilla   CYSTOSCOPY W/ RETROGRADES Bilateral 03/21/2020   Procedure: CYSTOSCOPY WITH RETROGRADE PYELOGRAM;  Surgeon: Remi Haggard, MD;  Location: [facility];  Service: Urology;  Laterality: Bilateral;   INGUINAL HERNIA REPAIR  09/27/2011   Procedure: HERNIA REPAIR INGUINAL ADULT;  Surgeon: Gwenyth Ober, MD;  Location: [place];  Service: General;  Laterality: Right;   KNEE ARTHROSCOPY Left 1986   MASTECTOMY WITH AXILLARY LYMPH NODE DISSECTION Left 2000   W/  RECONSTRUCTION   NASAL SINUS SURGERY  1980s   ROTATOR CUFF REPAIR Right 1985   TONSILLECTOMY  child   TRANSURETHRAL RESECTION OF BLADDER TUMOR N/A 03/21/2020   Procedure: TRANSURETHRAL RESECTION OF BLADDER TUMOR (TURBT);  Surgeon: Remi Haggard, MD;  Location: [facility];  Service: Urology;  Laterality:  N/A;  30 MINS    There were no vitals filed for this visit.   Subjective Assessment - 10/05/20 1233     Subjective I have some Lt hip pain this morning.  Not sure why.    Currently in Pain? Yes    Pain Score 6     Pain Location Hip    Pain Orientation Left    Pain Descriptors / Indicators Aching    Pain Type Chronic pain    Pain Onset More than a month ago    Pain Frequency Intermittent    Aggravating Factors  just woke up with it today                               [facility] Adult PT Treatment/Exercise - 10/05/20 0001       Lumbar Exercises: Aerobic   Nustep Level 2 x 10 minutes- PT present to discuss progress/status      Knee/Hip Exercises: Stretches   Hip Flexor  Stretch Both;2 reps;20 seconds;Left      Knee/Hip Exercises: Standing   Heel Raises 20 reps    Heel Raises Limitations standing on foam pad    Lateral Step Up Right;Left;1 set;Hand Hold: 1;Step Height: 2";20 reps    Lateral Step Up Limitations using foam pad    Rocker Board 3 minutes    Rebounder weight shifting 3 ways x1 min each, heel raises x10    Other Standing Knee Exercises farmer's carry 2 5#  weightsx 50 feet                      PT Short Term Goals - 09/19/20 1623       PT SHORT TERM GOAL #1   Title Pt will report at least 25% less hip pain    Status Achieved      PT SHORT TERM GOAL #2   Title Pt will be ind with initial HEP    Time 4    Period Weeks    Status On-going      PT SHORT TERM GOAL #3   Title Pt will improve FGA by at least 3 points for reduced risk of falls    Time 4    Period Weeks    Status On-going               PT Long Term Goals - 10/02/20 1232       PT LONG TERM GOAL #1   Title Pt will improve FGA by at least 6 points for reduced risk of falls    Status On-going      PT LONG TERM GOAL #2   Title Pt will be able to ambulate and up and down  4 step in the gym without UE support or deviations due to improved LE strength    Baseline minimal gait deviations    Status On-going      PT LONG TERM GOAL #3   Title Pt will report at least 60% less hip pain due to improved pain management techniques    Baseline 40% better    Time 12    Period Weeks    Status On-going      PT LONG TERM GOAL #4   Title FOTO score will improve to at least 60 based on outcome prediction    Baseline 43    Status On-going  Plan - 10/05/20 1308     Clinical Impression Statement Pt arrived with increased Lt hip pain of unknown origin today. pain reduced with exercise today and pt tolerated all exercises well today.  Pt reports 40% reduction in hip pain since the start of care and continues to work on balance.  FOTO score  is improved from 37 to 43 this week indicating improved function.  Pt demonstrates improved Lt hip flexor strength and DF strength and demonstrates improved symmetry with gait on level surface.  Minimal Trendelenburg that becomes moderate with fatigue or when carrying weight.  Pt is able to tolerate more advanced balance tasks in the clinic.  PT provides stand by assistance to contact guard assistance for safety and to provide cueing.  Pt will continue to benefit from skilled PT to address strength, flexibility and balance tasks to improve safety and independence at home and in the community.    PT Frequency 2x / week    PT Duration 12 weeks    PT Treatment/Interventions ADLs/Self Care Home Management;Aquatic Therapy;Cryotherapy;Electrical Stimulation;Iontophoresis 4mg /ml Dexamethasone;Moist Heat;Therapeutic activities;Gait training;Traction;Ultrasound;Therapeutic exercise;Neuromuscular re-education;Patient/family education;Manual techniques;Scar mobilization;Taping;Dry needling;Passive range of motion;Stair training    PT Next Visit Plan dynamic balance, hip ROM, core and hip strength    PT Home Exercise Plan Access Code: [email or web address]    Consulted and Agree with Plan of Care Patient             Patient will benefit from skilled therapeutic intervention in order to improve the following deficits and impairments:  Pain, Postural dysfunction, Impaired flexibility, Increased fascial restricitons, Decreased strength, Abnormal gait, Difficulty walking, Decreased range of motion, Decreased balance  Visit Diagnosis: History of falling  Difficulty in walking, not elsewhere classified  Muscle weakness (generalized)  Pain in left hip     Problem List Patient Active Problem List   Diagnosis Date Noted   Malignant neoplasm of overlapping sites of bladder ([place]) 08/21/2020   Bilateral leg edema 03/08/2020   Mild concentric left ventricular hypertrophy (LVH) 11/25/2019   Hyperglycemia 11/25/2019    DDD (degenerative disc disease), thoracolumbar 61/95/0932   Diastolic dysfunction without heart failure 11/02/2019   Enchondroma of finger of right hand 07/22/2019   Slow transit constipation 07/22/2019   Venous insufficiency of both lower extremities 01/11/2019   Abdominal aortic atherosclerosis ([place]) 01/11/2019   Malignant neoplasm of left female breast ([place]) 01/11/2019   Cervical radiculopathy 07/07/2017   Insomnia 01/14/2014   Mixed hyperlipidemia 12/14/2013   HTN (hypertension) 12/14/2013   GERD (gastroesophageal reflux disease) 12/14/2013   CAD (coronary artery disease) 12/14/2013   Depression 12/14/2013   Fibromyalgia 12/14/2013   Sigurd Sos, PT 10/05/20 1:12 PM   [facility] [address], [place], [place], [address] Phone: [phone]   Fax:  [phone]  Name: CRISTALLE ROHM MRN: [id number] Date of Birth: 10/11/1945

## 2020-10-09 DIAGNOSIS — L821 Other seborrheic keratosis: Secondary | ICD-10-CM | POA: Diagnosis not present

## 2020-10-09 DIAGNOSIS — L2084 Intrinsic (allergic) eczema: Secondary | ICD-10-CM | POA: Diagnosis not present

## 2020-10-09 DIAGNOSIS — L811 Chloasma: Secondary | ICD-10-CM | POA: Diagnosis not present

## 2020-10-10 ENCOUNTER — Telehealth: Payer: Self-pay | Admitting: Family Medicine

## 2020-10-10 ENCOUNTER — Ambulatory Visit: Payer: Medicare Other | Admitting: Physical Therapy

## 2020-10-10 ENCOUNTER — Other Ambulatory Visit: Payer: Self-pay

## 2020-10-10 DIAGNOSIS — Z9181 History of falling: Secondary | ICD-10-CM

## 2020-10-10 DIAGNOSIS — M25552 Pain in left hip: Secondary | ICD-10-CM | POA: Diagnosis not present

## 2020-10-10 DIAGNOSIS — R262 Difficulty in walking, not elsewhere classified: Secondary | ICD-10-CM | POA: Diagnosis not present

## 2020-10-10 DIAGNOSIS — M6281 Muscle weakness (generalized): Secondary | ICD-10-CM | POA: Diagnosis not present

## 2020-10-10 NOTE — Therapy (Signed)
[facility] [address], [place], [place], [address] Phone: [phone]   Fax:  [phone]  Physical Therapy Treatment  Patient Details  Name: [name] MRN: [id number] Date of Birth: 06/05/1945 Referring Provider (PT): Martinique, Betty G, MD   Encounter Date: 10/10/2020   PT End of Session - 10/10/20 1237     Visit Number 12    Date for PT Re-Evaluation 11/21/20    Authorization Type medicare A/B- KX at 15    Progress Note Due on Visit 20    PT Start Time 7510    PT Stop Time 1229    PT Time Calculation (min) 44 min    Activity Tolerance Patient tolerated treatment well             Past Medical History:  Diagnosis Date   Bilateral lower extremity edema    per pt left ankle greater than right   Bladder tumor    CAD (coronary artery disease)    cardiac cath 01/ 2005 by dr Terrence Dupont,  non-obstructive cad;  nuclear study 07-31-2016  normal with no evidence ischemia,nuclear ef 52%   Diverticulosis of colon    Fibromyalgia    GERD (gastroesophageal reflux disease)    Hematuria    Hemorrhoids    Hiatal hernia    History of cancer chemotherapy 2000   left breast cancer   History of diverticulitis of colon    History of gastritis    History of kidney stones    History of left breast cancer 2000   dx 2000 and s/p left mastectomy w/ node dissection;  completed chemo and radiation therapy same year 2000 and completed anti-estrogen 2005;  per pt released from oncologist 2006 and no recurrence   Hyperlipidemia    Hypertension    followed by pcp   Lower urinary tract symptoms (LUTS)    freq/ urg/ nocturia   Personal history of radiation therapy 2000   left breast cancer   Wears glasses     Past Surgical History:  Procedure Laterality Date   ABDOMINAL HYSTERECTOMY  1978   AND LYSIS ADHESIONS /  APPENDECTOMY  (still has ovaries)   ANTERIOR AND POSTERIOR REPAIR WITH SACROSPINOUS FIXATION  07-11-2008   @[facility]    AND ENTEROCELE REPAIR/ UTEROSACRAL COLPOSUSPENSION   BREAST REDUCTION SURGERY Right 2000   CARDIAC CATHETERIZATION  05-10-2003   dr Terrence Dupont   mild non-obstructive cad,  ef 55-60%   CATARACT EXTRACTION W/ INTRAOCULAR LENS  IMPLANT, BILATERAL  2015   CYST EXCISION  02-28-2020  @[facility]    right axilla   CYSTOSCOPY W/ RETROGRADES Bilateral 03/21/2020   Procedure: CYSTOSCOPY WITH RETROGRADE PYELOGRAM;  Surgeon: Remi Haggard, MD;  Location: [facility];  Service: Urology;  Laterality: Bilateral;   INGUINAL HERNIA REPAIR  09/27/2011   Procedure: HERNIA REPAIR INGUINAL ADULT;  Surgeon: Gwenyth Ober, MD;  Location: [place];  Service: General;  Laterality: Right;   KNEE ARTHROSCOPY Left 1986   MASTECTOMY WITH AXILLARY LYMPH NODE DISSECTION Left 2000   W/  RECONSTRUCTION   NASAL SINUS SURGERY  1980s   ROTATOR CUFF REPAIR Right 1985   TONSILLECTOMY  child   TRANSURETHRAL RESECTION OF BLADDER TUMOR N/A 03/21/2020   Procedure: TRANSURETHRAL RESECTION OF BLADDER TUMOR (TURBT);  Surgeon: Remi Haggard, MD;  Location: [facility];  Service: Urology;  Laterality: N/A;  30 MINS    There were no vitals filed for this visit.   Subjective Assessment -  10/10/20 1146     Subjective I got the shingles shot and my arm is really sore.  I think that also threw off my balance.    Pertinent History hx of breast and bladder cancer, hysterectomy, ant/post wall repair,TRAM flap surgery; daughter getting married in [place] in July    Patient Stated Goals get up/down steps and walking for exercise    Currently in Pain? No/denies    Pain Score 0-No pain    Pain Location Hip                [facility] PT Assessment - 10/10/20 0001       Functional Gait  Assessment   Gait Level Surface Walks 20 ft in less than 7 sec but greater than 5.5 sec, uses assistive device, slower speed, mild gait deviations, or deviates 6-10 in outside of the 12 in walkway width.    Change in Gait Speed  Able to smoothly change walking speed without loss of balance or gait deviation. Deviate no more than 6 in outside of the 12 in walkway width.    Gait with Horizontal Head Turns Performs head turns smoothly with slight change in gait velocity (eg, minor disruption to smooth gait path), deviates 6-10 in outside 12 in walkway width, or uses an assistive device.    Gait with Vertical Head Turns Performs task with slight change in gait velocity (eg, minor disruption to smooth gait path), deviates 6 - 10 in outside 12 in walkway width or uses assistive device    Gait and Pivot Turn Pivot turns safely in greater than 3 sec and stops with no loss of balance, or pivot turns safely within 3 sec and stops with mild imbalance, requires small steps to catch balance.    Step Over Obstacle Is able to step over one shoe box (4.5 in total height) without changing gait speed. No evidence of imbalance.    Gait with Narrow Base of Support Ambulates 7-9 steps.    Gait with Eyes Closed Walks 20 ft, no assistive devices, good speed, no evidence of imbalance, normal gait pattern, deviates no more than 6 in outside 12 in walkway width. Ambulates 20 ft in less than 7 sec.    Ambulating Backwards Walks 20 ft, no assistive devices, good speed, no evidence for imbalance, normal gait    Steps Alternating feet, must use rail.    Total Score 23                           [facility] Adult PT Treatment/Exercise - 10/10/20 0001       Lumbar Exercises: Aerobic   Nustep Level 2 x 10 minutes- PT present to discuss progress/status      Knee/Hip Exercises: Standing   Forward Step Up Right;Left;10 reps    Forward Step Up Limitations toubh above step; 1 railing touch only    Rebounder weight shifting 3 ways x1 min each, heel raises x10    Other Standing Knee Exercises UE random reaches and LE touches to floor circles    Other Standing Knee Exercises farmers carry 5# and 10# weight to circles on floor stopping and turning                       PT Short Term Goals - 10/10/20 1243       PT SHORT TERM GOAL #1   Title Pt will report at least 25% less hip pain  Status Achieved      PT SHORT TERM GOAL #2   Title Pt will be ind with initial HEP    Status Achieved      PT SHORT TERM GOAL #3   Title Pt will improve FGA by at least 3 points for reduced risk of falls    Status Achieved               PT Long Term Goals - 10/10/20 1243       PT LONG TERM GOAL #1   Title Pt will improve FGA by at least 6 points for reduced risk of falls    Status Achieved      PT LONG TERM GOAL #2   Title Pt will be able to ambulate and up and down  4 step in the gym without UE support or deviations due to improved LE strength    Time 12    Period Weeks    Status On-going      PT LONG TERM GOAL #3   Time 12    Period Weeks    Status On-going      PT LONG TERM GOAL #4   Title FOTO score will improve to at least 60 based on outcome prediction    Time 12    Period Weeks    Status On-going                   Plan - 10/10/20 1238     Clinical Impression Statement Excellent improvement in Functional Gait Assessment (FGA) from initial score of 15/30 to 23/30 today.  Difficulty with maintaining gait speed with head turns, turning and stopping, walking with narrow base of support and reliance on railings with ascending/descending stairs.   Treatment focus on these areas with CGA from therapist for safety but no extra physical assist required.  She reports general fatigue but no pain during treatment session.    Comorbidities chronic left hip, hysterectomy, ant/post repair of prolapse, TRAM flap    Rehab Potential Excellent    PT Frequency 2x / week    PT Duration 12 weeks    PT Treatment/Interventions ADLs/Self Care Home Management;Aquatic Therapy;Cryotherapy;Electrical Stimulation;Iontophoresis 4mg /ml Dexamethasone;Moist Heat;Therapeutic activities;Gait training;Traction;Ultrasound;Therapeutic  exercise;Neuromuscular re-education;Patient/family education;Manual techniques;Scar mobilization;Taping;Dry needling;Passive range of motion;Stair training    PT Next Visit Plan check remaining goals and schedule additional visits;  dynamic balance, hip ROM, core and hip strength    PT Home Exercise Plan Access Code: [email or web address]             Patient will benefit from skilled therapeutic intervention in order to improve the following deficits and impairments:  Pain, Postural dysfunction, Impaired flexibility, Increased fascial restricitons, Decreased strength, Abnormal gait, Difficulty walking, Decreased range of motion, Decreased balance  Visit Diagnosis: History of falling  Difficulty in walking, not elsewhere classified  Muscle weakness (generalized)  Pain in left hip     Problem List Patient Active Problem List   Diagnosis Date Noted   Malignant neoplasm of overlapping sites of bladder ([place]) 08/21/2020   Bilateral leg edema 03/08/2020   Mild concentric left ventricular hypertrophy (LVH) 11/25/2019   Hyperglycemia 11/25/2019   DDD (degenerative disc disease), thoracolumbar 76/16/0737   Diastolic dysfunction without heart failure 11/02/2019   Enchondroma of finger of right hand 07/22/2019   Slow transit constipation 07/22/2019   Venous insufficiency of both lower extremities 01/11/2019   Abdominal aortic atherosclerosis ([place]) 01/11/2019   Malignant neoplasm of left female breast ([place]) 01/11/2019  Cervical radiculopathy 07/07/2017   Insomnia 01/14/2014   Mixed hyperlipidemia 12/14/2013   HTN (hypertension) 12/14/2013   GERD (gastroesophageal reflux disease) 12/14/2013   CAD (coronary artery disease) 12/14/2013   Depression 12/14/2013   Fibromyalgia 12/14/2013   [name], PT 10/10/20 12:46 PM Phone: [phone] Fax: [phone]  [name] 10/10/2020, 12:46 PM  [facility] [address] [place], [place], [address] Phone: [phone]   Fax:  [phone]  Name: [name] MRN: [id number] Date of Birth: Apr 28, 1945

## 2020-10-10 NOTE — Telephone Encounter (Signed)
  Please call [facility] [phone], option 1, then 2 Ref #[id number] [facility] calling for clarification on order from April on potassium chloride SA (KLOR-CON) 20 MEQ tablet

## 2020-10-12 ENCOUNTER — Ambulatory Visit: Payer: Medicare Other | Admitting: Physical Therapy

## 2020-10-12 ENCOUNTER — Telehealth: Payer: Self-pay

## 2020-10-12 ENCOUNTER — Other Ambulatory Visit: Payer: Self-pay

## 2020-10-12 DIAGNOSIS — R262 Difficulty in walking, not elsewhere classified: Secondary | ICD-10-CM

## 2020-10-12 DIAGNOSIS — Z9181 History of falling: Secondary | ICD-10-CM | POA: Diagnosis not present

## 2020-10-12 DIAGNOSIS — M6281 Muscle weakness (generalized): Secondary | ICD-10-CM

## 2020-10-12 DIAGNOSIS — M25552 Pain in left hip: Secondary | ICD-10-CM

## 2020-10-12 NOTE — Telephone Encounter (Signed)
error 

## 2020-10-12 NOTE — Therapy (Signed)
[facility] [address], [place], [place], [address] Phone: [phone]   Fax:  [phone]  Physical Therapy Treatment/Discharge Summary   Patient Details  Name: [name] MRN: [id number] Date of Birth: 1946-02-04 Referring Provider (PT): Martinique, Betty G, MD   Encounter Date: 10/12/2020   PT End of Session - 10/12/20 1717     Visit Number 13    Date for PT Re-Evaluation 11/21/20    Authorization Type medicare A/B- KX at 15    Progress Note Due on Visit 20    PT Start Time 4503    PT Stop Time 8882   discharge visit   PT Time Calculation (min) 35 min    Activity Tolerance Patient tolerated treatment well             Past Medical History:  Diagnosis Date   Bilateral lower extremity edema    per pt left ankle greater than right   Bladder tumor    CAD (coronary artery disease)    cardiac cath 01/ 2005 by dr Terrence Dupont,  non-obstructive cad;  nuclear study 07-31-2016  normal with no evidence ischemia,nuclear ef 52%   Diverticulosis of colon    Fibromyalgia    GERD (gastroesophageal reflux disease)    Hematuria    Hemorrhoids    Hiatal hernia    History of cancer chemotherapy 2000   left breast cancer   History of diverticulitis of colon    History of gastritis    History of kidney stones    History of left breast cancer 2000   dx 2000 and s/p left mastectomy w/ node dissection;  completed chemo and radiation therapy same year 2000 and completed anti-estrogen 2005;  per pt released from oncologist 2006 and no recurrence   Hyperlipidemia    Hypertension    followed by pcp   Lower urinary tract symptoms (LUTS)    freq/ urg/ nocturia   Personal history of radiation therapy 2000   left breast cancer   Wears glasses     Past Surgical History:  Procedure Laterality Date   ABDOMINAL HYSTERECTOMY  1978   AND LYSIS ADHESIONS /  APPENDECTOMY  (still has ovaries)   ANTERIOR AND POSTERIOR REPAIR WITH  SACROSPINOUS FIXATION  07-11-2008  '@[facility]'    AND ENTEROCELE REPAIR/ UTEROSACRAL COLPOSUSPENSION   BREAST REDUCTION SURGERY Right 2000   CARDIAC CATHETERIZATION  05-10-2003   dr Terrence Dupont   mild non-obstructive cad,  ef 55-60%   CATARACT EXTRACTION W/ INTRAOCULAR LENS  IMPLANT, BILATERAL  2015   CYST EXCISION  02-28-2020  '@[facility]'    right axilla   CYSTOSCOPY W/ RETROGRADES Bilateral 03/21/2020   Procedure: CYSTOSCOPY WITH RETROGRADE PYELOGRAM;  Surgeon: Remi Haggard, MD;  Location: [facility];  Service: Urology;  Laterality: Bilateral;   INGUINAL HERNIA REPAIR  09/27/2011   Procedure: HERNIA REPAIR INGUINAL ADULT;  Surgeon: Gwenyth Ober, MD;  Location: [place] [facility];  Service: General;  Laterality: Right;   KNEE ARTHROSCOPY Left 1986   MASTECTOMY WITH AXILLARY LYMPH NODE DISSECTION Left 2000   W/  RECONSTRUCTION   NASAL SINUS SURGERY  1980s   ROTATOR CUFF REPAIR Right 1985   TONSILLECTOMY  child   TRANSURETHRAL RESECTION OF BLADDER TUMOR N/A 03/21/2020   Procedure: TRANSURETHRAL RESECTION OF BLADDER TUMOR (TURBT);  Surgeon: Remi Haggard, MD;  Location: [facility];  Service: Urology;  Laterality: N/A;  30 MINS    There were no vitals filed for this  visit.   Subjective Assessment - 10/12/20 1528     Subjective I think I'm ready to do the ex's on my own.  Last had hip pain yesterday, lifted stuff for [employer].    Pertinent History hx of breast and bladder cancer, hysterectomy, ant/post wall repair,TRAM flap surgery; daughter getting married in [place] in July    How long can you walk comfortably? 2 miles    Patient Stated Goals get up/down steps and walking for exercise    Currently in Pain? No/denies    Pain Score 0-No pain    Pain Location Hip    Pain Orientation Left                [facility] PT Assessment - 10/12/20 0001       Observation/Other Assessments   Focus on Therapeutic Outcomes (FOTO)  72%      Strength   Left Hip Flexion 4+/5    Left Hip  Extension 4+/5    Left Hip ADduction 4+/5    Left Knee Flexion 5/5    Left Knee Extension 4+/5    Left Ankle Dorsiflexion 4+/5      Functional Gait  Assessment   Gait Level Surface Walks 20 ft in less than 7 sec but greater than 5.5 sec, uses assistive device, slower speed, mild gait deviations, or deviates 6-10 in outside of the 12 in walkway width.    Change in Gait Speed Able to smoothly change walking speed without loss of balance or gait deviation. Deviate no more than 6 in outside of the 12 in walkway width.    Gait with Horizontal Head Turns Performs head turns smoothly with slight change in gait velocity (eg, minor disruption to smooth gait path), deviates 6-10 in outside 12 in walkway width, or uses an assistive device.    Gait with Vertical Head Turns Performs task with slight change in gait velocity (eg, minor disruption to smooth gait path), deviates 6 - 10 in outside 12 in walkway width or uses assistive device    Gait and Pivot Turn Pivot turns safely in greater than 3 sec and stops with no loss of balance, or pivot turns safely within 3 sec and stops with mild imbalance, requires small steps to catch balance.    Step Over Obstacle Is able to step over one shoe box (4.5 in total height) without changing gait speed. No evidence of imbalance.    Gait with Narrow Base of Support Ambulates 7-9 steps.    Gait with Eyes Closed Walks 20 ft, no assistive devices, good speed, no evidence of imbalance, normal gait pattern, deviates no more than 6 in outside 12 in walkway width. Ambulates 20 ft in less than 7 sec.    Ambulating Backwards Walks 20 ft, no assistive devices, good speed, no evidence for imbalance, normal gait    Steps Alternating feet, must use rail.    Total Score 23                           [facility] Adult PT Treatment/Exercise - 10/12/20 0001       Lumbar Exercises: Stretches   Active Hamstring Stretch Limitations supine with strap per HEP    Passive  Hamstring Stretch Limitations review of seated HEP and when to focus on this stretch    Figure 4 Stretch Limitations review of seated and supine pirformis stretch      Lumbar Exercises: Aerobic   Nustep Level 2  x 10 minutes- PT present to discuss progress/status      Lumbar Exercises: Standing   Heel Raises Limitations review of this from HEP    Other Standing Lumbar Exercises discussion on balance challenges next to kitchen counter or hallway to work on turns, single leg balance      Lumbar Exercises: Sidelying   Other Sidelying Lumbar Exercises review of clams in HEP      Knee/Hip Exercises: Standing   Other Standing Knee Exercises up/down 4 steps 3 trials reciprocally with 1 railing      Knee/Hip Exercises: Seated   Sit to Sand --   review of sit to stand on HEP and discussion of adding weight and speed for intensity                   PT Education - 10/12/20 1556     Education Details supine HS stretch    Person(s) Educated Patient    Methods Explanation;Handout;Demonstration    Comprehension Verbalized understanding;Returned demonstration              PT Short Term Goals - 10/12/20 1720       PT SHORT TERM GOAL #1   Title Pt will report at least 25% less hip pain    Status Achieved      PT SHORT TERM GOAL #2   Title Pt will be ind with initial HEP    Status Achieved      PT SHORT TERM GOAL #3   Title Pt will improve FGA by at least 3 points for reduced risk of falls    Status Achieved               PT Long Term Goals - 10/12/20 1720       PT LONG TERM GOAL #1   Title Pt will improve FGA by at least 6 points for reduced risk of falls    Status Achieved      PT LONG TERM GOAL #2   Title Pt will be able to ambulate and up and down  4 step in the gym without UE support or deviations due to improved LE strength    Baseline needs light touch on 1 railing    Status Partially Met      PT LONG TERM GOAL #3   Title Pt will report at least 60%  less hip pain due to improved pain management techniques    Status Achieved      PT LONG TERM GOAL #4   Title FOTO score will improve to at least 60 based on outcome prediction    Status Achieved                   Plan - 10/12/20 1718     Clinical Impression Statement The patient expresses readiness for discharge to independent HEP.  She rates her overall progress at 70%.  Her FOTO functional outcome score has improved significantly to 72%.   Her hip pain has been intermittent and very low in intensity when present.   Her LE strength is grossly 4+/5 bilaterally.  Her Functional Gait Assessment FGA has improved from 15/30 to 23/30.  She reports a good understanding of her HEP for further strengthening and balance improvement to help prepare her for a trip to [facility] in July.  She has met the majority of rehab goals.  Discharge from PT at this time.  Patient will benefit from skilled therapeutic intervention in order to improve the following deficits and impairments:     Visit Diagnosis: History of falling  Difficulty in walking, not elsewhere classified  Muscle weakness (generalized)  Pain in left hip    PHYSICAL THERAPY DISCHARGE SUMMARY  Visits from Start of Care: 13  Current functional level related to goals / functional outcomes: See clinical impressions above   Remaining deficits: As above   Education / Equipment: HEP   Patient agrees to discharge. Patient goals were met. Patient is being discharged due to meeting the stated rehab goals.  Problem List Patient Active Problem List   Diagnosis Date Noted   Malignant neoplasm of overlapping sites of bladder ([place]) 08/21/2020   Bilateral leg edema 03/08/2020   Mild concentric left ventricular hypertrophy (LVH) 11/25/2019   Hyperglycemia 11/25/2019   DDD (degenerative disc disease), thoracolumbar 31/25/0871   Diastolic dysfunction without heart failure 11/02/2019   Enchondroma of finger of  right hand 07/22/2019   Slow transit constipation 07/22/2019   Venous insufficiency of both lower extremities 01/11/2019   Abdominal aortic atherosclerosis ([place]) 01/11/2019   Malignant neoplasm of left female breast ([place]) 01/11/2019   Cervical radiculopathy 07/07/2017   Insomnia 01/14/2014   Mixed hyperlipidemia 12/14/2013   HTN (hypertension) 12/14/2013   GERD (gastroesophageal reflux disease) 12/14/2013   CAD (coronary artery disease) 12/14/2013   Depression 12/14/2013   Fibromyalgia 12/14/2013   Ruben Im, PT 10/12/20 5:24 PM Phone: [phone] Fax: [phone]  Alvera Singh 10/12/2020, 5:23 PM  [facility] [address], [place], [place], [address] Phone: [phone]   Fax:  [phone]  Name: [name] MRN: [id number] Date of Birth: 1945/07/16

## 2020-10-12 NOTE — Patient Instructions (Signed)
Access Code: [email or web address] URL: [email or web address] Date: 10/12/2020 Prepared by: Ruben Im  Exercises Seated Hamstring Stretch - 3 x daily - 7 x weekly - 1 sets - 3 reps - 20 hold Supine Piriformis Stretch with Leg Straight - 2 x daily - 7 x weekly - 1 sets - 3 reps - 20 hold Seated Figure 4 Piriformis Stretch - 2 x daily - 7 x weekly - 1 sets - 3 reps - 20 hold Sit to Stand - 2 x daily - 7 x weekly - 2 sets - 10 reps Heel rises with counter support - 2 x daily - 7 x weekly - 2 sets - 10 reps Clamshell - 1 x daily - 7 x weekly - 1 sets - 10 reps Forward Step Up with Counter Support - 1 x daily - 7 x weekly - 2 sets - 5 reps Standing Hip Abduction on Slider - 1 x daily - 7 x weekly - 2 sets - 5 reps Supine Hamstring Stretch with Strap - 1 x daily - 7 x weekly - 1 sets - 3 reps - 30 hold

## 2020-10-12 NOTE — Telephone Encounter (Signed)
I have spoke to the pharmacist.   They requested to change the Rx to Klor-Con M 40meq because what was sent by PCP on 4/26 was not available in the meq that was prescribed. PCP stated that it was ok to switch.

## 2020-10-13 NOTE — Telephone Encounter (Signed)
I spoke with [facility] and informed them that Potassium chloride 20 meq was not prescribed by PCP and to follow up with prescribing provider.

## 2020-10-16 ENCOUNTER — Other Ambulatory Visit: Payer: Self-pay

## 2020-10-16 DIAGNOSIS — I10 Essential (primary) hypertension: Secondary | ICD-10-CM

## 2020-10-16 DIAGNOSIS — E876 Hypokalemia: Secondary | ICD-10-CM

## 2020-10-16 MED ORDER — POTASSIUM CHLORIDE CRYS ER 20 MEQ PO TBCR: EXTENDED_RELEASE_TABLET | ORAL | 1 refills | Status: DC

## 2020-11-17 ENCOUNTER — Other Ambulatory Visit: Payer: Self-pay

## 2020-11-17 DIAGNOSIS — E876 Hypokalemia: Secondary | ICD-10-CM

## 2020-11-17 DIAGNOSIS — I10 Essential (primary) hypertension: Secondary | ICD-10-CM

## 2020-11-17 MED ORDER — POTASSIUM CHLORIDE CRYS ER 20 MEQ PO TBCR: EXTENDED_RELEASE_TABLET | ORAL | 1 refills | Status: DC

## 2020-11-21 ENCOUNTER — Other Ambulatory Visit: Payer: Self-pay

## 2020-11-21 ENCOUNTER — Ambulatory Visit (INDEPENDENT_AMBULATORY_CARE_PROVIDER_SITE_OTHER): Payer: Medicare Other | Admitting: Family Medicine

## 2020-11-21 ENCOUNTER — Encounter: Payer: Self-pay | Admitting: Family Medicine

## 2020-11-21 ENCOUNTER — Other Ambulatory Visit: Payer: Self-pay | Admitting: Family Medicine

## 2020-11-21 VITALS — BP 126/70 | HR 64 | Temp 98.0°F | Resp 16 | Ht 67.0 in | Wt 209.5 lb

## 2020-11-21 DIAGNOSIS — I10 Essential (primary) hypertension: Secondary | ICD-10-CM

## 2020-11-21 DIAGNOSIS — E782 Mixed hyperlipidemia: Secondary | ICD-10-CM

## 2020-11-21 DIAGNOSIS — R109 Unspecified abdominal pain: Secondary | ICD-10-CM | POA: Diagnosis not present

## 2020-11-21 DIAGNOSIS — I700 Atherosclerosis of aorta: Secondary | ICD-10-CM

## 2020-11-21 DIAGNOSIS — R600 Localized edema: Secondary | ICD-10-CM

## 2020-11-21 DIAGNOSIS — E876 Hypokalemia: Secondary | ICD-10-CM

## 2020-11-21 DIAGNOSIS — R7303 Prediabetes: Secondary | ICD-10-CM

## 2020-11-21 DIAGNOSIS — M25552 Pain in left hip: Secondary | ICD-10-CM

## 2020-11-21 LAB — HEMOGLOBIN A1C: Hgb A1c MFr Bld: 6.2 % (ref 4.6–6.5)

## 2020-11-21 LAB — BASIC METABOLIC PANEL
BUN: 10 mg/dL (ref 6–23)
CO2: 27 mEq/L (ref 19–32)
Calcium: 10.2 mg/dL (ref 8.4–10.5)
Chloride: 109 mEq/L (ref 96–112)
Creatinine, Ser: 0.71 mg/dL (ref 0.40–1.20)
GFR: 83.09 mL/min (ref >60.00)
Glucose, Bld: 91 mg/dL (ref 70–99)
Potassium: 4.3 mEq/L (ref 3.5–5.1)
Sodium: 144 mEq/L (ref 135–145)

## 2020-11-21 MED ORDER — ROSUVASTATIN CALCIUM 10 MG PO TABS: ORAL_TABLET | ORAL | 3 refills | Status: DC

## 2020-11-21 MED ORDER — MELOXICAM 15 MG PO TABS: 15.0000 mg | ORAL_TABLET | Freq: Every day | ORAL | 0 refills | Status: DC

## 2020-11-21 NOTE — Assessment & Plan Note (Signed)
A heakthy life style for diabetes prevention encouraged. Further recommendations according to HgA1C result.

## 2020-11-21 NOTE — Assessment & Plan Note (Addendum)
Chronic. We discussed possible etiologies. OA most likely.Meloxicam 15 mg daily as needed, side effects discussed. On examination today small inguinal hernia, which could be aggravating pain. Will wait for abdominal/pelvic CT before referring to ortho. Wt loss will help.

## 2020-11-21 NOTE — Assessment & Plan Note (Signed)
We discussed some benefits of statin medications, she has not tolerated them well in the past. She agrees with resuming Crestor 10 mg 2 times per week. Continue low fat diet.

## 2020-11-21 NOTE — Assessment & Plan Note (Signed)
Continue KLOR 20 meq daily. Further recommendations according to K+ result.

## 2020-11-21 NOTE — Progress Notes (Addendum)
HPI:  [name] is a 75 y.o. female, who is here today for follow up. She was last seen on 08/21/20.  Hypertension: Dx'ed in the late 1990's. Medications:Losartan 100 mg and Torsemide 20 mg BP readings at home:120's/80's. Side effects:None.  Negative for unusual or severe headache, visual changes, exertional chest pain, dyspnea,  focal weakness, or edema.  LE edema, alleviated by LE elevation. Worse at the end of the day. No erythema or pain. She takes Torsemide 20 mg daily.  HypoK+: She is on KLOR 20 meq daily.  Lab Results  Component Value Date   CREATININE 0.80 03/21/2020   BUN 13 03/21/2020   NA 144 03/21/2020   K 3.8 03/21/2020   CL 102 03/21/2020   CO2 27 11/25/2019   Still having anterior left hip pain. She has had pain for about a year and getting worse. Achy like pain and sometimes throbbing. Pain can be up to 10/10. Years ago she had an intra articular hip injection. Pain is exacerbated by walking, lifting left foot when walking. She feels like she is tripping more often. Also pain when getting up or sitting down. Alleviated by rest.  Took son's Ibuprofen 600 mg and really helped.  HLD: Stains have caused cramps. She has tried different cholesterol meds, statin and non statins. She even tried 1-2 times per week and seemed to be better tolerated.  Aortic atherosclerosis has been seen on imaging, abdominal CT in 10/2017.  Lab Results  Component Value Date   CHOL 304 (H) 01/11/2019   HDL 62.50 01/11/2019   LDLCALC 176 (H) 01/02/2017   LDLDIRECT 209.0 01/11/2019   TRIG 238.0 (H) 01/11/2019   CHOLHDL 5 01/11/2019   11/25/19 HgA1C 5.8. Negative for polydipsia,polyuria, or polyphagia.  Review of Systems  Constitutional:  Negative for activity change, appetite change, fatigue and fever.  HENT:  Negative for mouth sores, nosebleeds and sore throat.   Respiratory:  Negative for cough and wheezing.   Gastrointestinal:  Negative for abdominal  pain, nausea and vomiting.       Negative for changes in bowel habits.  Genitourinary:  Negative for decreased urine volume, dysuria and hematuria.  Musculoskeletal:  Positive for arthralgias and gait problem.  Skin:  Negative for pallor and rash.  Neurological:  Negative for syncope, facial asymmetry and weakness.  Rest see pertinent positives and negatives per HPI.  Current Outpatient Medications on File Prior to Visit  Medication Sig Dispense Refill   acetaminophen (TYLENOL) 325 MG tablet Take 650 mg by mouth every 6 (six) hours as needed.     BIOTIN PO Take 1 tablet by mouth daily.     CALCIUM PO Take by mouth.     cetirizine (ZYRTEC) 10 MG tablet Take 10 mg by mouth daily.     Cholecalciferol (VITAMIN D3 PO) Take by mouth.     cyanocobalamin 1000 MCG tablet Take 1,000 mcg by mouth daily.     Magnesium 400 MG TABS Take 400 mg by mouth daily. 90 tablet 3   meclizine (ANTIVERT) 25 MG tablet Take 1 tablet (25 mg total) by mouth 3 (three) times daily as needed for dizziness. 90 tablet 1   pantoprazole (PROTONIX) 40 MG tablet Take 1 tablet (40 mg total) by mouth daily. 90 tablet 1   potassium chloride SA (KLOR-CON) 20 MEQ tablet TAKE 1 TABLET(20 MEQ) BY MOUTH THREE TIMES DAILY 270 tablet 1   Probiotic Product (ALIGN PO) Take 1 tablet by mouth daily.  torsemide (DEMADEX) 20 MG tablet Take 1 tablet (20 mg total) by mouth daily. 90 tablet 1   No current facility-administered medications on file prior to visit.   Past Medical History:  Diagnosis Date   Bilateral lower extremity edema    per pt left ankle greater than right   Bladder tumor    CAD (coronary artery disease)    cardiac cath 01/ 2005 by dr Terrence Dupont,  non-obstructive cad;  nuclear study 07-31-2016  normal with no evidence ischemia,nuclear ef 52%   Diverticulosis of colon    Fibromyalgia    GERD (gastroesophageal reflux disease)    Hematuria    Hemorrhoids    Hiatal hernia    History of cancer chemotherapy 2000   left  breast cancer   History of diverticulitis of colon    History of gastritis    History of kidney stones    History of left breast cancer 2000   dx 2000 and s/p left mastectomy w/ node dissection;  completed chemo and radiation therapy same year 2000 and completed anti-estrogen 2005;  per pt released from oncologist 2006 and no recurrence   Hyperlipidemia    Hypertension    followed by pcp   Lower urinary tract symptoms (LUTS)    freq/ urg/ nocturia   Personal history of radiation therapy 2000   left breast cancer   Wears glasses    Allergies  Allergen Reactions   Hydrochlorothiazide Other (See Comments)    High Ca++   Flomax [Tamsulosin] Itching   Codeine Itching   Statins Other (See Comments)    Muscle aches   Zetia [Ezetimibe] Other (See Comments)    Muscle aches   Social History   Socioeconomic History   Marital status: Widowed    Spouse name: Not on file   Number of children: Not on file   Years of education: Not on file   Highest education level: Not on file  Occupational History   Not on file  Tobacco Use   Smoking status: Former    Years: 15.00    Types: Cigarettes    Quit date: 09/20/1998    Years since quitting: 22.1   Smokeless tobacco: Never  Vaping Use   Vaping Use: Never used  Substance and Sexual Activity   Alcohol use: Yes    Alcohol/week: 0.0 standard drinks    Comment: Rare   Drug use: Never   Sexual activity: Not Currently    Birth control/protection: Surgical, Post-menopausal    Comment: 1st intercourse 75 yo-Fewer than 5 partners  Other Topics Concern   Not on file  Social History Narrative   Widowed- husband died of Alzheimers in 02-17-2013.   Social Determinants of Health   Financial Resource Strain: Medium Risk   Difficulty of Paying Living Expenses: Somewhat hard  Food Insecurity: No Food Insecurity   Worried About Charity fundraiser in the Last Year: Never true   Ran Out of Food in the Last Year: Never true  Transportation Needs:  No Transportation Needs   Lack of Transportation (Medical): No   Lack of Transportation (Non-Medical): No  Physical Activity: Insufficiently Active   Days of Exercise per Week: 3 days   Minutes of Exercise per Session: 20 min  Stress: No Stress Concern Present   Feeling of Stress : Not at all  Social Connections: Moderately Isolated   Frequency of Communication with Friends and Family: Twice a week   Frequency of Social Gatherings with Friends and Family: Twice a  week   Attends Religious Services: More than 4 times per year   Active Member of Clubs or Organizations: No   Attends Archivist Meetings: Never   Marital Status: Widowed    Vitals:   11/21/20 1357  BP: 126/70  Pulse: 64  Resp: 16  Temp: 98 F (36.7 C)  SpO2: 97%   Body mass index is 32.81 kg/m.  Physical Exam Vitals and nursing note reviewed.  Constitutional:      General: She is not in acute distress.    Appearance: She is well-developed.  HENT:     Head: Normocephalic and atraumatic.     Mouth/Throat:     Mouth: Mucous membranes are moist.     Pharynx: Oropharynx is clear.  Eyes:     Conjunctiva/sclera: Conjunctivae normal.  Cardiovascular:     Rate and Rhythm: Normal rate and regular rhythm.     Pulses:          Dorsalis pedis pulses are 2+ on the right side and 2+ on the left side.     Heart sounds: No murmur heard.    Comments: Pedal and LE non pitting LE edema. Pulmonary:     Effort: Pulmonary effort is normal. No respiratory distress.     Breath sounds: Normal breath sounds.  Abdominal:     Palpations: Abdomen is soft. There is no hepatomegaly.     Tenderness: There is abdominal tenderness in the periumbilical area and left lower quadrant.     Hernia: Left inguinal: ?hernia. Bulged area, not able to reduce, tender.     Comments: Asymmetric abdomen, left side more prominent and umbilical more toward left side. ? Ventral hernia.Tender with deep palpation.  Musculoskeletal:     Right  lower leg: Edema present.     Left lower leg: Edema present.  Lymphadenopathy:     Cervical: No cervical adenopathy.  Skin:    General: Skin is warm.     Findings: No erythema or rash.  Neurological:     General: No focal deficit present.     Mental Status: She is alert and oriented to person, place, and time.     Cranial Nerves: No cranial nerve deficit.     Comments: Antalgic gait, not assisted.  Psychiatric:     Comments: Well groomed, good eye contact.   ASSESSMENT AND PLAN:  Mr.Narya was seen today for follow-up.  Diagnoses and all orders for this visit: Orders Placed This Encounter  Procedures   CT ABDOMEN PELVIS W WO CONTRAST   Basic metabolic panel   Hemoglobin A1c   Lab Results  Component Value Date   HGBA1C 6.2 11/21/2020   Lab Results  Component Value Date   CREATININE 0.71 11/21/2020   BUN 10 11/21/2020   NA 144 11/21/2020   K 4.3 11/21/2020   CL 109 11/21/2020   CO2 27 11/21/2020   Abdominal pain, unspecified abdominal location On examination today. Left paraumbilical and LLQ areas are prominent and not soft, it seems to be a hernia. She has had inguinal hernia repair and s/p abdominal hysterectomy. Abdominal CT will be arranged and recommendations will be given accordingly. Instructed about warning signs.  Bilateral lower extremity edema Stable. Compression stocking 25 mmHg recommended. Continue Torsemide 20 mg daily, side effects discussed. Appropriate skin care.  Mixed hyperlipidemia We discussed some benefits of statin medications, she has not tolerated them well in the past. She agrees with resuming Crestor 10 mg 2 times per week. Continue low fat diet.  Abdominal aortic atherosclerosis (HCC) Crestor 10 mg to take 2 times per week started today.  HTN (hypertension) BP adequately controlled. Continue current management: Losartan 100 mg daily. DASH/low salt diet to continue. Monitor BP at home. Eye exam recommended  annually.  Prediabetes A heakthy life style for diabetes prevention encouraged. Further recommendations according to HgA1C result.  Hypokalemia Continue KLOR 20 meq daily. Further recommendations according to K+ result.  Hip pain, left Chronic. We discussed possible etiologies. OA most likely.Meloxicam 15 mg daily as needed, side effects discussed. On examination today small inguinal hernia, which could be aggravating pain. Will wait for abdominal/pelvic CT before referring to ortho. Wt loss will help.   Return in about 4 months (around 03/23/2021).   Karuna Balducci G. [place], MD  [facility]. [place] office.

## 2020-11-21 NOTE — Assessment & Plan Note (Signed)
Crestor 10 mg to take 2 times per week started today.

## 2020-11-21 NOTE — Patient Instructions (Addendum)
A few things to remember from today's visit:   Hip pain, left - Plan: meloxicam (MOBIC) 15 MG tablet  Abdominal aortic atherosclerosis ([place]), Chronic  Prediabetes - Plan: Basic metabolic panel, Hemoglobin A1c  Abdominal pain, unspecified abdominal location - Plan: CT ABDOMEN PELVIS W WO CONTRAST, CANCELED: CT Abdomen Pelvis Wo Contrast  If you need refills please call your pharmacy. Do not use My Chart to request refills or for acute issues that need immediate attention.   Meloxicam daily as needed for hip pain. Abdominal and pelvic CT will be arranged. Groin pain could be a hernia. Crestor 10 mg 2 times per week. Continue low fat diet.  Compression stocking 25 mmHg for edema.  Please be sure medication list is accurate. If a new problem present, please set up appointment sooner than planned today.

## 2020-11-21 NOTE — Assessment & Plan Note (Addendum)
BP adequately controlled. Continue current management: Losartan 100 mg daily. DASH/low salt diet to continue. Monitor BP at home. Eye exam recommended annually.

## 2020-11-23 ENCOUNTER — Other Ambulatory Visit: Payer: Self-pay | Admitting: Family Medicine

## 2020-11-23 DIAGNOSIS — R109 Unspecified abdominal pain: Secondary | ICD-10-CM

## 2020-11-24 ENCOUNTER — Ambulatory Visit (HOSPITAL_BASED_OUTPATIENT_CLINIC_OR_DEPARTMENT_OTHER)
Admission: RE | Admit: 2020-11-24 | Discharge: 2020-11-24 | Disposition: A | Discharge status: Home / Self Care | Payer: Medicare Other | Source: Ambulatory Visit | Attending: Family Medicine | Admitting: Family Medicine

## 2020-11-24 ENCOUNTER — Other Ambulatory Visit: Payer: Self-pay

## 2020-11-24 DIAGNOSIS — R109 Unspecified abdominal pain: Secondary | ICD-10-CM | POA: Diagnosis not present

## 2020-11-24 DIAGNOSIS — K8020 Calculus of gallbladder without cholecystitis without obstruction: Secondary | ICD-10-CM | POA: Diagnosis not present

## 2020-11-24 DIAGNOSIS — N200 Calculus of kidney: Secondary | ICD-10-CM | POA: Diagnosis not present

## 2020-11-24 DIAGNOSIS — K5730 Diverticulosis of large intestine without perforation or abscess without bleeding: Secondary | ICD-10-CM | POA: Diagnosis not present

## 2020-12-28 DIAGNOSIS — C678 Malignant neoplasm of overlapping sites of bladder: Secondary | ICD-10-CM | POA: Diagnosis not present

## 2021-01-01 ENCOUNTER — Other Ambulatory Visit: Payer: Self-pay | Admitting: Urology

## 2021-01-03 ENCOUNTER — Other Ambulatory Visit: Payer: Self-pay | Admitting: Urology

## 2021-01-03 ENCOUNTER — Encounter (HOSPITAL_BASED_OUTPATIENT_CLINIC_OR_DEPARTMENT_OTHER): Payer: Self-pay | Admitting: Urology

## 2021-01-03 ENCOUNTER — Other Ambulatory Visit: Payer: Self-pay

## 2021-01-03 NOTE — Progress Notes (Signed)
Spoke w/ via phone for pre-op interview--- [employer]----  ISTAT, EKG             Lab results------ COVID test -----patient states asymptomatic no test needed Arrive at ------- 0530 NPO after MN NO Solid Food.  Clear liquids from MN until--- Med rec completed Medications to take morning of surgery ----- Protonix Diabetic medication ----- Patient instructed no nail polish to be worn day of surgery Patient instructed to bring photo id and insurance card day of surgery Patient aware to have Driver (ride ) / caregiver    for 24 hours after surgery  Patient Special Instructions ----- Pre-Op special Istructions ----- Patient verbalized understanding of instructions that were given at this phone interview. Patient denies shortness of breath, chest pain, fever, cough at this phone interview.

## 2021-01-08 NOTE — H&P (Signed)
75 year old female with a past medical hisotry of Paget's disease of the breast, underwent chemo and radiation, past smoker, and nephrolithiasis. She was last in our office in 2019 and saw Dr. Pilar Jarvis. She reports 1 year of intermittent dysuria, worsening frequency, intermittent gross hematuria and incontinence associated with urgency. She dos not feel her urinary stream is strong and reports that it also splits. She does not feel she empties well. She denies fevers and chills. She denies passage of stone material. She currently has lower leg edema, non pitting. She has been checked by cardiology for this. Urinalysis is clear and PVR is 224mls.   -03/02/20-patient with history of intermittent dysuria and gross hematuria with incontinence as above. Underwent CT renal protocol scan 02/17/2020 and showed full very small nonobstructing right lower pole 1 mm stone otherwise normal upper urinary tract. See report below. Here for cysto to assess bladder.  Cysto performed today shows small papillary lesions in the left floor the bladder suspicious for low-grade transitional cell tumor measuring 1-2 cm x 2 lesions. Functional capacity was approximately 250 cc and bonney/Marshalll test was negative with Valsalva. There is a stress mediated contraction causing leakage in the standing position.  CLINICAL DATA: 75 year old female with history of gross hematuria.   EXAM:  CT ABDOMEN AND PELVIS WITHOUT AND WITH CONTRAST   TECHNIQUE:  Multidetector CT imaging of the abdomen and pelvis was performed  following the standard protocol before and following the bolus  administration of intravenous contrast.   CONTRAST: 125 mL of Omnipaque 300.   COMPARISON: CT the abdomen and pelvis 11/18/2017.   FINDINGS:  Lower chest: Atherosclerotic calcifications in the descending  thoracic aorta, as well as the left main, left anterior descending  and right coronary arteries.   Hepatobiliary: No suspicious cystic or solid  hepatic lesions. No  intra or extrahepatic biliary ductal dilatation. Gallbladder is  normal in appearance.   Pancreas: No pancreatic mass. No pancreatic ductal dilatation. No  pancreatic or peripancreatic fluid collections or inflammatory  changes.   Spleen: Unremarkable.   Adrenals/Urinary Tract: 1 mm nonobstructive of the right kidney  calculus in the lower pole collecting system. No additional calculi  are noted within the left renal collecting system, along the course  of either ureter, or within the lumen of the urinary bladder. No  hydroureteronephrosis. Subcentimeter low-attenuation lesions in both  kidneys, too small to characterize, but statistically likely to  represent tiny cysts. No definite aggressive appearing renal  lesions. On postcontrast delayed images there are no definite  filling defects within the collecting system of either kidney, along  the course of either ureter, or within the lumen of the urinary  bladder to strongly suggest the presence of urothelial neoplasm at  this time. Urinary bladder is under distended, but otherwise  unremarkable in appearance. Bilateral adrenal glands are normal in  appearance.   Stomach/Bowel: Normal appearance of the stomach. No pathologic  dilatation of small bowel or colon. Numerous colonic diverticulae  are noted, particularly in the sigmoid colon and descending colon,  without surrounding inflammatory changes to suggest an acute  diverticulitis at this time. The appendix is not confidently  identified and may be surgically absent. Regardless, there are no  inflammatory changes noted adjacent to the cecum to suggest the  presence of an acute appendicitis at this time.   Vascular/Lymphatic: Aortic atherosclerosis, without evidence of  aneurysm or dissection in the abdominal or pelvic vasculature. No  lymphadenopathy noted in the abdomen or pelvis.  Reproductive: Status post hysterectomy. Ovaries are not confidently   identified may be surgically absent or atrophic.   Other: No significant volume of ascites. No pneumoperitoneum.   Musculoskeletal: There are no aggressive appearing lytic or blastic  lesions noted in the visualized portions of the skeleton.   IMPRESSION:  1. 1 mm nonobstructive calculus in the lower pole collecting system  of the right kidney. No ureteral stones or findings of urinary tract  obstruction are noted at this time.  2. Colonic diverticulosis without evidence of acute diverticulitis  at this time.  3. Aortic atherosclerosis, in addition to left main and 2 vessel  coronary artery disease. Assessment for potential risk factor  modification, dietary therapy or pharmacologic therapy may be  warranted, if clinically indicated.  4. Additional incidental findings, as above.   CLINICAL DATA: 75 year old female with history of gross hematuria.   EXAM:  CT ABDOMEN AND PELVIS WITHOUT AND WITH CONTRAST   TECHNIQUE:  Multidetector CT imaging of the abdomen and pelvis was performed  following the standard protocol before and following the bolus  administration of intravenous contrast.   CONTRAST: 125 mL of Omnipaque 300.   COMPARISON: CT the abdomen and pelvis 11/18/2017.   FINDINGS:  Lower chest: Atherosclerotic calcifications in the descending  thoracic aorta, as well as the left main, left anterior descending  and right coronary arteries.   Hepatobiliary: No suspicious cystic or solid hepatic lesions. No  intra or extrahepatic biliary ductal dilatation. Gallbladder is  normal in appearance.   Pancreas: No pancreatic mass. No pancreatic ductal dilatation. No  pancreatic or peripancreatic fluid collections or inflammatory  changes.   Spleen: Unremarkable.   Adrenals/Urinary Tract: 1 mm nonobstructive of the right kidney  calculus in the lower pole collecting system. No additional calculi  are noted within the left renal collecting system, along the course  of  either ureter, or within the lumen of the urinary bladder. No  hydroureteronephrosis. Subcentimeter low-attenuation lesions in both  kidneys, too small to characterize, but statistically likely to  represent tiny cysts. No definite aggressive appearing renal  lesions. On postcontrast delayed images there are no definite  filling defects within the collecting system of either kidney, along  the course of either ureter, or within the lumen of the urinary  bladder to strongly suggest the presence of urothelial neoplasm at  this time. Urinary bladder is under distended, but otherwise  unremarkable in appearance. Bilateral adrenal glands are normal in  appearance.   Stomach/Bowel: Normal appearance of the stomach. No pathologic  dilatation of small bowel or colon. Numerous colonic diverticulae  are noted, particularly in the sigmoid colon and descending colon,  without surrounding inflammatory changes to suggest an acute  diverticulitis at this time. The appendix is not confidently  identified and may be surgically absent. Regardless, there are no  inflammatory changes noted adjacent to the cecum to suggest the  presence of an acute appendicitis at this time.   Vascular/Lymphatic: Aortic atherosclerosis, without evidence of  aneurysm or dissection in the abdominal or pelvic vasculature. No  lymphadenopathy noted in the abdomen or pelvis.   Reproductive: Status post hysterectomy. Ovaries are not confidently  identified may be surgically absent or atrophic.   Other: No significant volume of ascites. No pneumoperitoneum.   Musculoskeletal: There are no aggressive appearing lytic or blastic  lesions noted in the visualized portions of the skeleton.   IMPRESSION:  1. 1 mm nonobstructive calculus in the lower pole collecting system  of the  right kidney. No ureteral stones or findings of urinary tract  obstruction are noted at this time.  2. Colonic diverticulosis without evidence of acute  diverticulitis  at this time.  3. Aortic atherosclerosis, in addition to left main and 2 vessel  coronary artery disease. Assessment for potential risk factor  modification, dietary therapy or pharmacologic therapy may be  warranted, if clinically indicated.  4. Additional incidental findings, as above.   -03/29/20-patient with history of hematuria and was found to have papillary bladder tumor. Underwent cysto TURBT on 03/21/2020. The lesions were completely removed utilizing rigid biopsy forceps with deep biopsies showing some fat. Base was cauterized. The pathology shows high-grade noninvasive urothelial carcinoma. No postoperative issues. Here to discuss next steps of management.  -06/28/20-patient with history of high-grade superficial transitional cell carcinoma of the bladder diagnosed with TURBT on 03/21/2020. No interim GU issues. Here for follow-up surveillance cystoscopy and cytology.  Cysto performed today and shows: No evidence of recurrent bladder lesions   -09/29/20-patient with history of superficial high-grade transitional cell carcinoma of the bladder diagnosed with TURBT 03/21/2020. Has had negative surveillance cysto back in March of 2022. No interim GU issues here for follow-up surveillance cysto.  Cysto is performed today and shows: Normal bladder without evidence of mucosal lesions. Slight erythema in the left bladder base but it looked more like hypervascularity not a recurrent lesion.  Micro urinalysis shows 6-10 epithelial cells no significant hematuria.  -12/28/20-patient with history of superficial high-grade transitional cell carcinoma bladder diagnosed via TURBT in November 21. Has had negative surveillance cysto since that time. No interim GU issues.  Micro urinalysis  Cysto is performed today and shows: What appears to be a small slightly raised recurrent area on the left lateral bladder wall. Remainder of the bladder. Grossly normal.     ALLERGIES:  CODEINE Statins Tamsulosin - Itching (Moderate to Severe)   MEDICATIONS: Align  Losartan Potassium  Meclizine Hcl  Potassium  Protonix  Tramadol Hcl     GU PSH: Cystoscopy - 09/29/2020, 06/28/2020, 03/03/2020 Cystoscopy TURBT 2-5 cm - 03/21/2020 Hysterectomy Locm 300-399Mg /Ml Iodine,1Ml - 02/17/2020       PSH Notes: Right Cyst under arm removed  Right Mole removed off Nipple    NON-GU PSH: Breast Reconstruction Hernia Repair Mastectomy, Simple, Complete, Left Tonsillectomy         GU PMH: Bladder Cancer overlapping sites - 09/29/2020, - 06/28/2020, - 03/29/2020 Mixed incontinence - 09/29/2020, - 03/03/2020 Bladder tumor/neoplasm - 03/03/2020 Gross hematuria - 03/03/2020, - 02/17/2020 Dysuria - 02/08/2020 Incomplete bladder emptying - 02/08/2020 Splitting of urinary stream - 02/08/2020 Urinary Frequency - 02/08/2020 Weak Urinary Stream - 02/08/2020 Ureteral calculus - 2019    NON-GU PMH: Breast Cancer, History GERD Hypercholesterolemia Hypertension    FAMILY HISTORY: 2 daughters - Daughter 2 sons - Son   SOCIAL HISTORY: Marital Status: Widowed Preferred Language: English Current Smoking Status: Patient does not smoke anymore.  <DIV'  Tobacco Use Assessment Completed:  Used Tobacco in last 30 days?   Has never drank.  Drinks 1 caffeinated drink per day. Patient's occupation is/was Retired.    REVIEW OF SYSTEMS:     GU Review Female:  Patient denies frequent urination, hard to postpone urination, burning /pain with urination, get up at night to urinate, leakage of urine, stream starts and stops, trouble starting your stream, have to strain to urinate, and being pregnant.    Gastrointestinal (Upper):  Patient denies vomiting and indigestion/ heartburn.    Gastrointestinal (Lower):  Patient  denies diarrhea and constipation.    Constitutional:  Patient denies fever, night sweats, weight loss, and fatigue.    Skin:  Patient denies skin rash/ lesion and itching.     Eyes:  Patient denies blurred vision and double vision.    Ears/ Nose/ Throat:  Patient denies sore throat and sinus problems.    Hematologic/Lymphatic:  Patient denies swollen glands and easy bruising.    Cardiovascular:  Patient reports leg swelling. Patient denies chest pains.    Respiratory:  Patient denies cough and shortness of breath.    Endocrine:  Patient denies excessive thirst.    Musculoskeletal:  Patient denies back pain and joint pain.    Neurological:  Patient denies headaches and dizziness.    Psychologic:  Patient denies depression and anxiety.    VITAL SIGNS: None     GU PHYSICAL EXAMINATION:      External Genitalia: No hirsutism, no rash, no scarring, no cyst, no erythematous lesion, no papular lesion, no blanched lesion, no warty lesion. No edema.     Urethral Meatus: Normal size. Normal position. No discharge.     Urethra: No tenderness, no mass, no scarring. No hypermobility. No leakage.     Bladder: Normal to palpation, no tenderness, no mass, normal size.     MULTI-SYSTEM PHYSICAL EXAMINATION:      Constitutional: Well-nourished. No physical deformities. Normally developed. Good grooming.     Neck: Neck symmetrical, not swollen. Normal tracheal position.     Respiratory: No labored breathing, no use of accessory muscles.      Cardiovascular: Normal temperature, normal extremity pulses, no swelling, no varicosities.     Lymphatic: No enlargement of neck, axillae, groin.     Skin: No paleness, no jaundice, no cyanosis. No lesion, no ulcer, no rash.     Neurologic / Psychiatric: Oriented to time, oriented to place, oriented to person. No depression, no anxiety, no agitation.     Eyes: Normal conjunctivae. Normal eyelids.     Ears, Nose, Mouth, and Throat: Left ear no scars, no lesions, no masses. Right ear no scars, no lesions, no masses. Nose no scars, no lesions, no masses. Normal hearing. Normal lips.     Musculoskeletal: Normal gait and station of head and neck.             PAST DATA REVIEW: None   PROCEDURES:    Flexible Cystoscopy - 52000  Risks, benefits, and some of the potential complications of the procedure were discussed at length with the patient including infection, bleeding, voiding discomfort, urinary retention, fever, chills, sepsis, and others. All questions were answered. Informed consent was obtained. Antibiotic prophylaxis was given. Sterile technique and intraurethral analgesia were used.  Meatus:  Normal size. Normal location. Normal condition.  Urethra:  No hypermobility. No leakage.  Ureteral Orifices:  Normal location. Normal size. Normal shape. Effluxed clear urine.  Bladder:  Cysto is performed today and shows: What appears to be a small slightly raised recurrent area on the left lateral bladder wall. Remainder of the bladder. Grossly normal.      The lower urinary tract was carefully examined. The procedure was well-tolerated and without complications. Antibiotic instructions were given. Instructions were given to call the office immediately for bloody urine, difficulty urinating, urinary retention, painful or frequent urination, fever, chills, nausea, vomiting or other illness. The patient stated that she understood these instructions and would comply with them.    Urinalysis - 81003  Dipstick Dipstick Cont'd  Color: Yellow Bilirubin: Neg  mg/dL  Appearance: Clear Ketones: Neg mg/dL  Specific Gravity: 1.020 Blood: Neg ery/uL  pH: 8.0 Protein: Trace mg/dL  Glucose: Neg mg/dL Urobilinogen: 0.2 mg/dL   Nitrites: Neg   Leukocyte Esterase: Neg leu/uL    ASSESSMENT:     ICD-10 Details  1 GU:  Bladder Cancer overlapping sites - V01.3 Acute, Complicated Injury   PLAN:   Document  Letter(s):  Created for Patient: Clinical Summary   Notes:  Discussed cystoscopic findings with the patient. Recommended cysto retrogrades and TURBT in the near future. Risks and benefits discussed as outlined below. Schedule accordingly  TURBT  consent: I have discussed with the patient the risks, benefits of TURBT which include but are not limited to: Bleeding, infection, damage to the bladder with potential perforation of the bladder, damage to surrounding organs, possible need for further procedures including open repair and catheterization, possibility of nonhealing area within the bladder, urgency, frequency which may be refractory to medications. I pointed out that in some occasions after resection of the bladder tumor, mitomycin-C chemotherapy may be instilled into the bladder. The risks associated with this therapy include but are not limited to: Refractory or new onset urgency, frequency, dysuria, infrequently severe systemic side effects secondary to mitomycin-C. After full discussion of the risks, benefits and alternatives, the patient has consented to the above procedure and desires to proceed.

## 2021-01-08 NOTE — Anesthesia Preprocedure Evaluation (Addendum)
Anesthesia Evaluation  Patient identified by MRN, date of birth, ID band Patient awake    Reviewed: Allergy & Precautions, NPO status , Patient's Chart, lab work & pertinent test results  History of Anesthesia Complications Negative for: history of anesthetic complications  Airway Mallampati: I  TM Distance: >3 FB Neck ROM: Full    Dental  (+) Edentulous Upper, Upper Dentures, Missing, Dental Advisory Given   Pulmonary former smoker,    breath sounds clear to auscultation       Cardiovascular hypertension, Pt. on medications (-) angina+ CAD (non-obstructive)   Rhythm:Regular Rate:Normal  '21 ECHO: EF 60-65%, normal LVF, mild LVH, grade 1 DD, no significant valvular abnotmalities   Neuro/Psych Depression negative neurological ROS     GI/Hepatic Neg liver ROS, GERD  Medicated and Controlled,  Endo/Other  obese  Renal/GU negative Renal ROS   Bladder cancer    Musculoskeletal  (+) Arthritis , Osteoarthritis,  Fibromyalgia -  Abdominal (+) + obese,   Peds  Hematology negative hematology ROS (+)   Anesthesia Other Findings H/o breast cancer: surgery, XRT, chemo  Reproductive/Obstetrics                            Anesthesia Physical Anesthesia Plan  ASA: 3  Anesthesia Plan: General   Post-op Pain Management:    Induction: Intravenous  PONV Risk Score and Plan: 3 and Ondansetron, Dexamethasone and Treatment may vary due to age or medical condition  Airway Management Planned: LMA  Additional Equipment: None  Intra-op Plan:   Post-operative Plan:   Informed Consent: I have reviewed the patients History and Physical, chart, labs and discussed the procedure including the risks, benefits and alternatives for the proposed anesthesia with the patient or authorized representative who has indicated his/her understanding and acceptance.     Dental advisory given  Plan Discussed with:  CRNA and Surgeon  Anesthesia Plan Comments:        Anesthesia Quick Evaluation

## 2021-01-09 ENCOUNTER — Encounter (HOSPITAL_BASED_OUTPATIENT_CLINIC_OR_DEPARTMENT_OTHER): Payer: Self-pay | Admitting: Urology

## 2021-01-09 ENCOUNTER — Ambulatory Visit (HOSPITAL_BASED_OUTPATIENT_CLINIC_OR_DEPARTMENT_OTHER): Payer: Medicare Other | Admitting: Anesthesiology

## 2021-01-09 ENCOUNTER — Encounter (HOSPITAL_BASED_OUTPATIENT_CLINIC_OR_DEPARTMENT_OTHER)
Admission: RE | Disposition: A | Discharge status: Home / Self Care | Payer: Self-pay | Source: Home / Self Care | Attending: Urology

## 2021-01-09 ENCOUNTER — Ambulatory Visit (HOSPITAL_BASED_OUTPATIENT_CLINIC_OR_DEPARTMENT_OTHER)
Admission: RE | Admit: 2021-01-09 | Discharge: 2021-01-09 | Disposition: A | Discharge status: Home / Self Care | Payer: Medicare Other | Attending: Urology | Admitting: Urology

## 2021-01-09 DIAGNOSIS — K219 Gastro-esophageal reflux disease without esophagitis: Secondary | ICD-10-CM | POA: Diagnosis not present

## 2021-01-09 DIAGNOSIS — Z888 Allergy status to other drugs, medicaments and biological substances status: Secondary | ICD-10-CM | POA: Insufficient documentation

## 2021-01-09 DIAGNOSIS — Z79899 Other long term (current) drug therapy: Secondary | ICD-10-CM | POA: Insufficient documentation

## 2021-01-09 DIAGNOSIS — Z9221 Personal history of antineoplastic chemotherapy: Secondary | ICD-10-CM | POA: Insufficient documentation

## 2021-01-09 DIAGNOSIS — I2510 Atherosclerotic heart disease of native coronary artery without angina pectoris: Secondary | ICD-10-CM | POA: Diagnosis not present

## 2021-01-09 DIAGNOSIS — Z6832 Body mass index (BMI) 32.0-32.9, adult: Secondary | ICD-10-CM | POA: Insufficient documentation

## 2021-01-09 DIAGNOSIS — I10 Essential (primary) hypertension: Secondary | ICD-10-CM | POA: Diagnosis not present

## 2021-01-09 DIAGNOSIS — Z885 Allergy status to narcotic agent status: Secondary | ICD-10-CM | POA: Diagnosis not present

## 2021-01-09 DIAGNOSIS — Z8551 Personal history of malignant neoplasm of bladder: Secondary | ICD-10-CM | POA: Diagnosis not present

## 2021-01-09 DIAGNOSIS — Z853 Personal history of malignant neoplasm of breast: Secondary | ICD-10-CM | POA: Diagnosis not present

## 2021-01-09 DIAGNOSIS — K5730 Diverticulosis of large intestine without perforation or abscess without bleeding: Secondary | ICD-10-CM | POA: Insufficient documentation

## 2021-01-09 DIAGNOSIS — Z923 Personal history of irradiation: Secondary | ICD-10-CM | POA: Insufficient documentation

## 2021-01-09 DIAGNOSIS — C672 Malignant neoplasm of lateral wall of bladder: Secondary | ICD-10-CM | POA: Diagnosis not present

## 2021-01-09 DIAGNOSIS — N200 Calculus of kidney: Secondary | ICD-10-CM | POA: Insufficient documentation

## 2021-01-09 DIAGNOSIS — E669 Obesity, unspecified: Secondary | ICD-10-CM | POA: Diagnosis not present

## 2021-01-09 DIAGNOSIS — C679 Malignant neoplasm of bladder, unspecified: Secondary | ICD-10-CM | POA: Diagnosis present

## 2021-01-09 DIAGNOSIS — C678 Malignant neoplasm of overlapping sites of bladder: Secondary | ICD-10-CM | POA: Diagnosis not present

## 2021-01-09 DIAGNOSIS — Z87891 Personal history of nicotine dependence: Secondary | ICD-10-CM | POA: Diagnosis not present

## 2021-01-09 HISTORY — PX: CYSTOSCOPY W/ RETROGRADES: SHX1426

## 2021-01-09 HISTORY — PX: TRANSURETHRAL RESECTION OF BLADDER TUMOR: SHX2575

## 2021-01-09 LAB — POCT I-STAT, CHEM 8
BUN: 5 mg/dL — ABNORMAL LOW (ref 8–23)
Calcium, Ion: 1.49 mmol/L — ABNORMAL HIGH (ref 1.15–1.40)
Chloride: 106 mmol/L (ref 98–111)
Creatinine, Ser: 0.8 mg/dL (ref 0.44–1.00)
Glucose, Bld: 99 mg/dL (ref 70–99)
HCT: 39 % (ref 36.0–46.0)
Hemoglobin: 13.3 g/dL (ref 12.0–15.0)
Potassium: 4.2 mmol/L (ref 3.5–5.1)
Sodium: 143 mmol/L (ref 135–145)
TCO2: 27 mmol/L (ref 22–32)

## 2021-01-09 SURGERY — TURBT (TRANSURETHRAL RESECTION OF BLADDER TUMOR)
Anesthesia: General | Site: Bladder

## 2021-01-09 MED ORDER — PROMETHAZINE HCL 25 MG/ML IJ SOLN: 6.2500 mg | INTRAMUSCULAR | Status: DC | PRN

## 2021-01-09 MED ORDER — BELLADONNA ALKALOIDS-OPIUM 16.2-60 MG RE SUPP
RECTAL | Status: DC | PRN
  Administered 2021-01-09: 1 via RECTAL

## 2021-01-09 MED ORDER — FENTANYL CITRATE (PF) 100 MCG/2ML IJ SOLN
25.0000 ug | INTRAMUSCULAR | Status: DC | PRN
  Administered 2021-01-09: 25 ug via INTRAVENOUS
  Administered 2021-01-09: 50 ug via INTRAVENOUS
  Administered 2021-01-09: 25 ug via INTRAVENOUS

## 2021-01-09 MED ORDER — LIDOCAINE HCL (PF) 2 % IJ SOLN
INTRAMUSCULAR | Status: AC
  Filled 2021-01-09: qty 5

## 2021-01-09 MED ORDER — ONDANSETRON HCL 4 MG/2ML IJ SOLN
INTRAMUSCULAR | Status: DC | PRN
  Administered 2021-01-09: 4 mg via INTRAVENOUS

## 2021-01-09 MED ORDER — OXYCODONE HCL 5 MG PO TABS
ORAL_TABLET | ORAL | Status: AC
  Filled 2021-01-09: qty 1

## 2021-01-09 MED ORDER — OXYCODONE HCL 5 MG/5ML PO SOLN: 5.0000 mg | Freq: Once | ORAL | Status: AC | PRN

## 2021-01-09 MED ORDER — PROPOFOL 10 MG/ML IV BOLUS
INTRAVENOUS | Status: DC | PRN
  Administered 2021-01-09: 120 mg via INTRAVENOUS
  Administered 2021-01-09: 30 mg via INTRAVENOUS

## 2021-01-09 MED ORDER — TRAMADOL HCL 50 MG PO TABS: 50.0000 mg | ORAL_TABLET | Freq: Four times a day (QID) | ORAL | 0 refills | Status: DC | PRN

## 2021-01-09 MED ORDER — IOHEXOL 300 MG/ML  SOLN
INTRAMUSCULAR | Status: DC | PRN
  Administered 2021-01-09: 20 mL

## 2021-01-09 MED ORDER — DEXAMETHASONE SODIUM PHOSPHATE 10 MG/ML IJ SOLN
INTRAMUSCULAR | Status: DC | PRN
  Administered 2021-01-09: 4 mg via INTRAVENOUS

## 2021-01-09 MED ORDER — FENTANYL CITRATE (PF) 100 MCG/2ML IJ SOLN
INTRAMUSCULAR | Status: DC | PRN
  Administered 2021-01-09: 50 ug via INTRAVENOUS

## 2021-01-09 MED ORDER — BELLADONNA ALKALOIDS-OPIUM 16.2-60 MG RE SUPP
RECTAL | Status: AC
  Filled 2021-01-09: qty 1

## 2021-01-09 MED ORDER — LACTATED RINGERS IV SOLN: INTRAVENOUS | Status: DC

## 2021-01-09 MED ORDER — ACETAMINOPHEN 500 MG PO TABS
1000.0000 mg | ORAL_TABLET | Freq: Once | ORAL | Status: AC
  Administered 2021-01-09: 1000 mg via ORAL

## 2021-01-09 MED ORDER — FENTANYL CITRATE (PF) 100 MCG/2ML IJ SOLN
INTRAMUSCULAR | Status: AC
  Filled 2021-01-09: qty 2

## 2021-01-09 MED ORDER — ONDANSETRON HCL 4 MG/2ML IJ SOLN
INTRAMUSCULAR | Status: AC
  Filled 2021-01-09: qty 2

## 2021-01-09 MED ORDER — MIDAZOLAM HCL 2 MG/2ML IJ SOLN: 0.5000 mg | Freq: Once | INTRAMUSCULAR | Status: DC | PRN

## 2021-01-09 MED ORDER — STERILE WATER FOR IRRIGATION IR SOLN
Status: DC | PRN
  Administered 2021-01-09: 3000 mL

## 2021-01-09 MED ORDER — DEXAMETHASONE SODIUM PHOSPHATE 10 MG/ML IJ SOLN
INTRAMUSCULAR | Status: AC
  Filled 2021-01-09: qty 1

## 2021-01-09 MED ORDER — OXYCODONE HCL 5 MG PO TABS
5.0000 mg | ORAL_TABLET | Freq: Once | ORAL | Status: AC | PRN
  Administered 2021-01-09: 5 mg via ORAL

## 2021-01-09 MED ORDER — ACETAMINOPHEN 500 MG PO TABS
ORAL_TABLET | ORAL | Status: AC
  Filled 2021-01-09: qty 2

## 2021-01-09 MED ORDER — PROPOFOL 10 MG/ML IV BOLUS
INTRAVENOUS | Status: AC
  Filled 2021-01-09: qty 40

## 2021-01-09 MED ORDER — LIDOCAINE 2% (20 MG/ML) 5 ML SYRINGE
INTRAMUSCULAR | Status: DC | PRN
  Administered 2021-01-09: 15 mg via INTRAVENOUS

## 2021-01-09 MED ORDER — CEFAZOLIN SODIUM-DEXTROSE 2-4 GM/100ML-% IV SOLN
INTRAVENOUS | Status: AC
  Filled 2021-01-09: qty 100

## 2021-01-09 MED ORDER — CEFAZOLIN SODIUM-DEXTROSE 2-4 GM/100ML-% IV SOLN
2.0000 g | INTRAVENOUS | Status: AC
  Administered 2021-01-09: 2 g via INTRAVENOUS

## 2021-01-09 SURGICAL SUPPLY — 39 items
BAG DRAIN URO-CYSTO SKYTR STRL (DRAIN) ×3 IMPLANT
BAG DRN RND TRDRP ANRFLXCHMBR (UROLOGICAL SUPPLIES)
BAG DRN UROCATH (DRAIN) ×2
BAG URINE DRAIN 2000ML AR STRL (UROLOGICAL SUPPLIES) IMPLANT
BAG URINE LEG 500ML (DRAIN) IMPLANT
BULB IRRIG PATHFIND (MISCELLANEOUS) IMPLANT
CATH FOLEY 2WAY SLVR  5CC 20FR (CATHETERS)
CATH FOLEY 2WAY SLVR  5CC 22FR (CATHETERS)
CATH FOLEY 2WAY SLVR 5CC 20FR (CATHETERS) IMPLANT
CATH FOLEY 2WAY SLVR 5CC 22FR (CATHETERS) IMPLANT
CATH URET 5FR 28IN CONE TIP (BALLOONS)
CATH URET 5FR 28IN OPEN ENDED (CATHETERS) IMPLANT
CATH URET 5FR 70CM CONE TIP (BALLOONS) IMPLANT
CLOTH BEACON ORANGE TIMEOUT ST (SAFETY) ×3 IMPLANT
DRSG TELFA 3X8 NADH (GAUZE/BANDAGES/DRESSINGS) ×3 IMPLANT
ELECT REM PT RETURN 9FT ADLT (ELECTROSURGICAL) ×3
ELECTRODE REM PT RTRN 9FT ADLT (ELECTROSURGICAL) ×2 IMPLANT
EVACUATOR MICROVAS BLADDER (UROLOGICAL SUPPLIES) IMPLANT
FIBER LASER FLEXIVA 365 (UROLOGICAL SUPPLIES) IMPLANT
GLOVE SURG ENC MOIS LTX SZ7.5 (GLOVE) ×3 IMPLANT
GOWN STRL REUS W/TWL LRG LVL3 (GOWN DISPOSABLE) ×3 IMPLANT
GUIDEWIRE ANG ZIPWIRE 038X150 (WIRE) IMPLANT
GUIDEWIRE STR DUAL SENSOR (WIRE) IMPLANT
HOLDER FOLEY CATH W/STRAP (MISCELLANEOUS) IMPLANT
IV NS IRRIG 3000ML ARTHROMATIC (IV SOLUTION) ×3 IMPLANT
KIT TURNOVER CYSTO (KITS) ×3 IMPLANT
LOOP MONOPOLAR YLW (ELECTROSURGICAL) IMPLANT
MANIFOLD NEPTUNE II (INSTRUMENTS) IMPLANT
NDL SAFETY ECLIPSE 18X1.5 (NEEDLE) ×2 IMPLANT
NEEDLE HYPO 18GX1.5 SHARP (NEEDLE) ×3
PACK CYSTO (CUSTOM PROCEDURE TRAY) ×3 IMPLANT
PAD DRESSING TELFA 3X8 NADH (GAUZE/BANDAGES/DRESSINGS) IMPLANT
PLUG CATH AND CAP STER (CATHETERS) IMPLANT
SYR 20ML LL LF (SYRINGE) ×3 IMPLANT
SYR TOOMEY IRRIG 70ML (MISCELLANEOUS)
SYRINGE TOOMEY IRRIG 70ML (MISCELLANEOUS) IMPLANT
TRACTIP FLEXIVA PULS ID 200XHI (Laser) IMPLANT
TRACTIP FLEXIVA PULSE ID 200 (Laser)
TUBE CONNECTING 12X1/4 (SUCTIONS) IMPLANT

## 2021-01-09 NOTE — Discharge Instructions (Signed)

## 2021-01-09 NOTE — Interval H&P Note (Signed)
History and Physical Interval Note:  01/09/2021 7:22 AM  [name]  has presented today for surgery, with the diagnosis of RECURRENT BLADDER CANCER.  The various methods of treatment have been discussed with the patient and family. After consideration of risks, benefits and other options for treatment, the patient has consented to  Procedure(s): TRANSURETHRAL RESECTION OF BLADDER TUMOR (TURBT) (N/A) CYSTOSCOPY WITH RETROGRADE PYELOGRAM (Bilateral) as a surgical intervention.  The patient's history has been reviewed, patient examined, no change in status, stable for surgery.  I have reviewed the patient's chart and labs.  Questions were answered to the patient's satisfaction.     [name]

## 2021-01-09 NOTE — Op Note (Signed)
Preoperative diagnosis:  1.  Recurrent bladder cancer  Postoperative diagnosis: 1.  Same  Procedure(s): 1.  Cystoscopy, bilateral retrograde pyelogram with intraoperative interpretation, transurethral section of bladder tumor (medium sized)  Surgeon: Dr. Harold Barban  Anesthesia: General  Complications: None  EBL: Minimal  Specimens: Bladder tumor  Disposition of specimens: To pathology  Intraoperative findings: Approximate 2 cm slightly raised erythematous area left posterior lateral bladder wall.  Area removed with rigid biopsy forceps and base cauterized.  Total cauterized area was about 3 cm.  Bilateral retrogrades were normal  Indication: Patient is a 75 year old African-American female with history of superficial transitional cell carcinoma the bladder.  She is found on surveillance cystoscopy to have recurrent lesion in the left posterior lateral aspect of the bladder.  Presents at this time to get a cystoscopy retrogrades and TURBT  Description of procedure:  After obtaining informed consent for the patient he was taken the major cystoscopy suite placed under general anesthesia.  Placed in the dorsolithotomy position genitalia prepped and draped in usual sterile fashion.  Proper pause and timeout was performed prior to beginning the procedure.  17 Fransico was advanced in the bladder.  Bladder was thoroughly inspected with 30 and 70 degree lenses with the above-noted findings.  In order to preserve pathologic integrity was felt that this area be best removed with rigid biopsy forceps.  Utilizing the rigid biopsy forceps the lesion from the left posterior lateral bladder wall was completely removed.  The base was subsequently cauterized with Bugbee electrode with no remaining lesion visualized.  There was good hemostasis noted.  Attention then directed toward retrogrades.  A 5 French cone-tip catheter was then utilized to perform retrograde pyelograms bilaterally.  Both upper  tracts filled promptly with contrast without evidence of filling defect or hydronephrosis.  Both upper tracts emptied out promptly upon removal the retrograde catheter.  Bladder was deflated and the area of resection reinspected with good hemostasis.  Procedure terminated.  BNO suppository was given at the end of the case for postoperative analgesia.  Patient was awakened from anesthesia she tolerated procedure well taken back to the recovery room in stable condition.  No immediate complication from the procedure.

## 2021-01-09 NOTE — Transfer of Care (Signed)
Immediate Anesthesia Transfer of Care Note  Patient: [name]  Procedure(s) Performed: TRANSURETHRAL RESECTION OF BLADDER TUMOR (TURBT) (Bladder) CYSTOSCOPY WITH RETROGRADE PYELOGRAM (Bilateral: Bladder)  Patient Location: PACU  Anesthesia Type:General  Level of Consciousness: drowsy  Airway & Oxygen Therapy: Patient Spontanous Breathing and Patient connected to nasal cannula oxygen  Post-op Assessment: Report given to RN  Post vital signs: Reviewed and stable  Last Vitals:  Vitals Value Taken Time  BP 140/80   Temp    Pulse 65 01/09/21 0819  Resp 18 01/09/21 0819  SpO2 99 % 01/09/21 0819  Vitals shown include unvalidated device data.  Last Pain:  Vitals:   01/09/21 0551  TempSrc: Oral  PainSc: 0-No pain      Patients Stated Pain Goal: 3 (00/34/96 1164)  Complications: No notable events documented.

## 2021-01-09 NOTE — Anesthesia Postprocedure Evaluation (Signed)
Anesthesia Post Note  Patient: [name]  Procedure(s) Performed: TRANSURETHRAL RESECTION OF BLADDER TUMOR (TURBT) (Bladder) CYSTOSCOPY WITH RETROGRADE PYELOGRAM (Bilateral: Bladder)     Patient location during evaluation: PACU Anesthesia Type: General Level of consciousness: awake and alert, patient cooperative and oriented Pain management: pain level controlled Vital Signs Assessment: post-procedure vital signs reviewed and stable Respiratory status: spontaneous breathing, nonlabored ventilation and respiratory function stable Cardiovascular status: blood pressure returned to baseline and stable Postop Assessment: no apparent nausea or vomiting and able to ambulate Anesthetic complications: no   No notable events documented.  Last Vitals:  Vitals:   01/09/21 0845 01/09/21 0900  BP: 133/68 131/68  Pulse: (!) 55 (!) 54  Resp: 12 12  Temp:    SpO2: 96% 98%    Last Pain:  Vitals:   01/09/21 0900  TempSrc:   PainSc: 5                  Ahmari Duerson,E. Shanieka Blea

## 2021-01-09 NOTE — Anesthesia Procedure Notes (Signed)
Procedure Name: LMA Insertion Date/Time: 01/09/2021 7:34 AM Performed by: Bonney Aid, CRNA Pre-anesthesia Checklist: Patient identified, Emergency Drugs available, Suction available and Patient being monitored Patient Re-evaluated:Patient Re-evaluated prior to induction Oxygen Delivery Method: Circle system utilized Preoxygenation: Pre-oxygenation with 100% oxygen Induction Type: IV induction Ventilation: Mask ventilation without difficulty LMA: LMA inserted LMA Size: 4.0 Number of attempts: 1 Airway Equipment and Method: Bite block Placement Confirmation: positive ETCO2 Tube secured with: Tape Dental Injury: Teeth and Oropharynx as per pre-operative assessment

## 2021-01-10 ENCOUNTER — Encounter (HOSPITAL_BASED_OUTPATIENT_CLINIC_OR_DEPARTMENT_OTHER): Payer: Self-pay | Admitting: Urology

## 2021-01-10 LAB — SURGICAL PATHOLOGY

## 2021-01-15 DIAGNOSIS — R8279 Other abnormal findings on microbiological examination of urine: Secondary | ICD-10-CM | POA: Diagnosis not present

## 2021-01-15 DIAGNOSIS — C678 Malignant neoplasm of overlapping sites of bladder: Secondary | ICD-10-CM | POA: Diagnosis not present

## 2021-01-22 DIAGNOSIS — Z23 Encounter for immunization: Secondary | ICD-10-CM | POA: Diagnosis not present

## 2021-01-24 DIAGNOSIS — H35373 Puckering of macula, bilateral: Secondary | ICD-10-CM | POA: Diagnosis not present

## 2021-01-24 DIAGNOSIS — H43811 Vitreous degeneration, right eye: Secondary | ICD-10-CM | POA: Diagnosis not present

## 2021-01-24 DIAGNOSIS — H04123 Dry eye syndrome of bilateral lacrimal glands: Secondary | ICD-10-CM | POA: Diagnosis not present

## 2021-01-24 DIAGNOSIS — H1045 Other chronic allergic conjunctivitis: Secondary | ICD-10-CM | POA: Diagnosis not present

## 2021-01-24 DIAGNOSIS — Z961 Presence of intraocular lens: Secondary | ICD-10-CM | POA: Diagnosis not present

## 2021-01-30 ENCOUNTER — Other Ambulatory Visit: Payer: Self-pay | Admitting: Family Medicine

## 2021-01-30 DIAGNOSIS — R600 Localized edema: Secondary | ICD-10-CM

## 2021-01-30 DIAGNOSIS — I10 Essential (primary) hypertension: Secondary | ICD-10-CM

## 2021-02-06 DIAGNOSIS — C678 Malignant neoplasm of overlapping sites of bladder: Secondary | ICD-10-CM | POA: Diagnosis not present

## 2021-02-06 DIAGNOSIS — Z5111 Encounter for antineoplastic chemotherapy: Secondary | ICD-10-CM | POA: Diagnosis not present

## 2021-02-13 DIAGNOSIS — Z5111 Encounter for antineoplastic chemotherapy: Secondary | ICD-10-CM | POA: Diagnosis not present

## 2021-02-13 DIAGNOSIS — C678 Malignant neoplasm of overlapping sites of bladder: Secondary | ICD-10-CM | POA: Diagnosis not present

## 2021-02-14 ENCOUNTER — Other Ambulatory Visit: Payer: Self-pay | Admitting: Internal Medicine

## 2021-02-14 DIAGNOSIS — K219 Gastro-esophageal reflux disease without esophagitis: Secondary | ICD-10-CM

## 2021-02-20 DIAGNOSIS — C678 Malignant neoplasm of overlapping sites of bladder: Secondary | ICD-10-CM | POA: Diagnosis not present

## 2021-02-20 DIAGNOSIS — Z5111 Encounter for antineoplastic chemotherapy: Secondary | ICD-10-CM | POA: Diagnosis not present

## 2021-02-23 ENCOUNTER — Other Ambulatory Visit: Payer: Self-pay | Admitting: Family Medicine

## 2021-02-23 DIAGNOSIS — Z1231 Encounter for screening mammogram for malignant neoplasm of breast: Secondary | ICD-10-CM

## 2021-02-27 DIAGNOSIS — Z5111 Encounter for antineoplastic chemotherapy: Secondary | ICD-10-CM | POA: Diagnosis not present

## 2021-02-27 DIAGNOSIS — C678 Malignant neoplasm of overlapping sites of bladder: Secondary | ICD-10-CM | POA: Diagnosis not present

## 2021-02-28 ENCOUNTER — Ambulatory Visit
Admission: RE | Admit: 2021-02-28 | Discharge: 2021-02-28 | Disposition: A | Discharge status: Home / Self Care | Payer: Medicare Other | Source: Ambulatory Visit | Attending: Family Medicine | Admitting: Family Medicine

## 2021-02-28 ENCOUNTER — Other Ambulatory Visit: Payer: Self-pay

## 2021-02-28 ENCOUNTER — Other Ambulatory Visit: Payer: Self-pay | Admitting: Family Medicine

## 2021-02-28 DIAGNOSIS — Z1231 Encounter for screening mammogram for malignant neoplasm of breast: Secondary | ICD-10-CM

## 2021-03-06 DIAGNOSIS — C678 Malignant neoplasm of overlapping sites of bladder: Secondary | ICD-10-CM | POA: Diagnosis not present

## 2021-03-06 DIAGNOSIS — Z5111 Encounter for antineoplastic chemotherapy: Secondary | ICD-10-CM | POA: Diagnosis not present

## 2021-03-13 DIAGNOSIS — C678 Malignant neoplasm of overlapping sites of bladder: Secondary | ICD-10-CM | POA: Diagnosis not present

## 2021-03-13 DIAGNOSIS — Z5111 Encounter for antineoplastic chemotherapy: Secondary | ICD-10-CM | POA: Diagnosis not present

## 2021-03-20 ENCOUNTER — Encounter: Payer: Self-pay | Admitting: Family Medicine

## 2021-03-20 ENCOUNTER — Telehealth (INDEPENDENT_AMBULATORY_CARE_PROVIDER_SITE_OTHER): Payer: Medicare Other | Admitting: Family Medicine

## 2021-03-20 DIAGNOSIS — U071 COVID-19: Secondary | ICD-10-CM | POA: Diagnosis not present

## 2021-03-20 MED ORDER — NIRMATRELVIR/RITONAVIR (PAXLOVID)TABLET: 3.0000 | ORAL_TABLET | Freq: Two times a day (BID) | ORAL | 0 refills | Status: AC

## 2021-03-20 MED ORDER — BENZONATATE 100 MG PO CAPS: ORAL_CAPSULE | ORAL | 0 refills | Status: DC

## 2021-03-20 NOTE — Patient Instructions (Signed)
HOME CARE TIPS:  -I sent the medication(s) we discussed to your pharmacy: Meds ordered this encounter  Medications   nirmatrelvir/ritonavir EUA (PAXLOVID) 20 x 150 MG & 10 x 100MG TABS    Sig: Take 3 tablets by mouth 2 (two) times daily for 5 days. (Take nirmatrelvir 150 mg two tablets twice daily for 5 days and ritonavir 100 mg one tablet twice daily for 5 days) Patient GFR is 83    Dispense:  30 tablet    Refill:  0   benzonatate (TESSALON PERLES) 100 MG capsule    Sig: 1-2 capsule up to twice daily for cough    Dispense:  40 capsule    Refill:  0     -I sent in the [facility] treatment or referral you requested per our discussion. Please see the information provided below and discuss further with the pharmacist/treatment team.  -If taking Paxlovid, please review all medications, supplement and over the counter drugs with your pharmacist and ask them to check for any interactions. Please make the following changes to your regular medications while taking Paxlovid: *hold you tramadol while on Paxlovid  -there is a chance of rebound illness after finishing your treatment. If you become sick again please isolate for an additional 5 days, plus 5 more days of masking.   -can use tylenol if needed for fevers, aches and pains per instructions  -can use nasal saline a few times per day if you have nasal congestion  -stay hydrated, drink plenty of fluids and eat small healthy meals - avoid dairy  -check out the [facility] website for more information on home care, transmission and treatment for COVID19  -follow up with your doctor in 2-3 days unless improving and feeling better  -stay home while sick, except to seek medical care. If you have COVID19, ideally it would be best to stay home for a full 10 days since the onset of symptoms PLUS one day of no fever and feeling better. Wear a good mask that fits snugly (such as N95 or KN95) if around others to reduce the risk of transmission.  It was nice  to meet you today, and I really hope you are feeling better soon. I help [facility] out with telemedicine visits on Tuesdays and Thursdays and am available for visits on those days. If you have any concerns or questions following this visit please schedule a follow up visit with your Primary Care doctor or seek care at a local urgent care clinic to avoid delays in care.    Seek in person care or schedule a follow up video visit promptly if your symptoms worsen, new concerns arise or you are not improving with treatment. Call 911 and/or seek emergency care if your symptoms are severe or life threatening.  FACT SHEET FOR PATIENTS, PARENTS, AND CAREGIVERS EMERGENCY USE AUTHORIZATION (EUA) OF PAXLOVID FOR CORONAVIRUS DISEASE 2019 (COVID-19) You are being given this Fact Sheet because your healthcare provider believes it is necessary to provide you with PAXLOVID for the treatment of mild-to-moderate coronavirus disease (COVID-19) caused by the SARS-CoV-2 virus. This Fact Sheet contains information to help you understand the risks and benefits of taking the PAXLOVID you have received or may receive. The U.S. Food and Drug Administration (FDA) has issued an Emergency Use Authorization (EUA) to make PAXLOVID available during the COVID-19 pandemic (for more details about an EUA please see "What is an Emergency Use Authorization?" at the end of this document). PAXLOVID is not an FDA-approved medicine in  the [place]. Read this Fact Sheet for information about PAXLOVID. Talk to your healthcare provider about your options or if you have any questions. It is your choice to take PAXLOVID.  What is COVID-19? COVID-19 is caused by a virus called a coronavirus. You can get COVID-19 through close contact with another person who has the virus. COVID-19 illnesses have ranged from very mild-to-severe, including illness resulting in death. While information so far suggests that most COVID-19 illness is mild,  serious illness can happen and may cause some of your other medical conditions to become worse. Older people and people of all ages with severe, long lasting (chronic) medical conditions like heart disease, lung disease, and diabetes, for example seem to be at higher risk of being hospitalized for COVID-19.  What is PAXLOVID? PAXLOVID is an investigational medicine used to treat mild-to-moderate COVID-19 in adults and children [37 years of age and older weighing at least 45 pounds (48 kg)] with positive results of direct SARS-CoV-2 viral testing, and who are at high risk for progression to severe COVID-19, including hospitalization or death. PAXLOVID is investigational because it is still being studied. There is limited information about the safety and effectiveness of using PAXLOVID to treat people with mild-to-moderate COVID-19.  The FDA has authorized the emergency use of PAXLOVID for the treatment of mild-tomoderate COVID-19 in adults and children [97 years of age and older weighing at least 66 pounds (53 kg)] with a positive test for the virus that causes COVID-19, and who are at high risk for progression to severe COVID-19, including hospitalization or death, under an EUA. 1 Revised: 07 July 2020   What should I tell my healthcare provider before I take PAXLOVID? Tell your healthcare provider if you: ? Have any allergies ? Have liver or kidney disease ? Are pregnant or plan to become pregnant ? Are breastfeeding a child ? Have any serious illnesses  Tell your healthcare provider about all the medicines you take, including prescription and over-the-counter medicines, vitamins, and herbal supplements. Some medicines may interact with PAXLOVID and may cause serious side effects. Keep a list of your medicines to show your healthcare provider and pharmacist when you get a new medicine.  You can ask your healthcare provider or pharmacist for a list of medicines that interact with  PAXLOVID. Do not start taking a new medicine without telling your healthcare provider. Your healthcare provider can tell you if it is safe to take PAXLOVID with other medicines.  Tell your healthcare provider if you are taking combined hormonal contraceptive. PAXLOVID may affect how your birth control pills work. Females who are able to become pregnant should use another effective alternative form of contraception or an additional barrier method of contraception. Talk to your healthcare provider if you have any questions about contraceptive methods that might be right for you.  How do I take PAXLOVID? ? PAXLOVID consists of 2 medicines: nirmatrelvir and ritonavir. o Take 2 pink tablets of nirmatrelvir with 1 white tablet of ritonavir by mouth 2 times each day (in the morning and in the evening) for 5 days. For each dose, take all 3 tablets at the same time. o If you have kidney disease, talk to your healthcare provider. You may need a different dose. ? Swallow the tablets whole. Do not chew, break, or crush the tablets. ? Take PAXLOVID with or without food. ? Do not stop taking PAXLOVID without talking to your healthcare provider, even if you feel better. ? If you miss  a dose of PAXLOVID within 8 hours of the time it is usually taken, take it as soon as you remember. If you miss a dose by more than 8 hours, skip the missed dose and take the next dose at your regular time. Do not take 2 doses of PAXLOVID at the same time. ? If you take too much PAXLOVID, call your healthcare provider or go to the nearest hospital emergency room right away. ? If you are taking a ritonavir- or cobicistat-containing medicine to treat hepatitis C or Human Immunodeficiency Virus (HIV), you should continue to take your medicine as prescribed by your healthcare provider. 2 Revised: 07 July 2020    Talk to your healthcare provider if you do not feel better or if you feel worse after 5 days.  Who should  generally not take PAXLOVID? Do not take PAXLOVID if: ? You are allergic to nirmatrelvir, ritonavir, or any of the ingredients in PAXLOVID. ? You are taking any of the following medicines: o Alfuzosin o Pethidine, propoxyphene o Ranolazine o Amiodarone, dronedarone, flecainide, propafenone, quinidine o Colchicine o Lurasidone, pimozide, clozapine o Dihydroergotamine, ergotamine, methylergonovine o Lovastatin, simvastatin o Sildenafil (Revatio) for pulmonary arterial hypertension (PAH) o Triazolam, oral midazolam o Apalutamide o Carbamazepine, phenobarbital, phenytoin o Rifampin o St. John's Wort (hypericum perforatum) Taking PAXLOVID with these medicines may cause serious or life-threatening side effects or affect how PAXLOVID works.  These are not the only medicines that may cause serious side effects if taken with PAXLOVID. PAXLOVID may increase or decrease the levels of multiple other medicines. It is very important to tell your healthcare provider about all of the medicines you are taking because additional laboratory tests or changes in the dose of your other medicines may be necessary while you are taking PAXLOVID. Your healthcare provider may also tell you about specific symptoms to watch out for that may indicate that you need to stop or decrease the dose of some of your other medicines.  What are the important possible side effects of PAXLOVID? Possible side effects of PAXLOVID are: ? Allergic Reactions. Allergic reactions can happen in people taking PAXLOVID, even after only 1 dose. Stop taking PAXLOVID and call your healthcare provider right away if you get any of the following symptoms of an allergic reaction: o hives o trouble swallowing or breathing o swelling of the mouth, lips, or face o throat tightness o hoarseness 3 Revised: 07 July 2020  o skin rash ? Liver Problems. Tell your healthcare provider right away if you have any of these signs and symptoms  of liver problems: loss of appetite, yellowing of your skin and the whites of eyes (jaundice), dark-colored urine, pale colored stools and itchy skin, stomach area (abdominal) pain. ? Resistance to HIV Medicines. If you have untreated HIV infection, PAXLOVID may lead to some HIV medicines not working as well in the future. ? Other possible side effects include: o altered sense of taste o diarrhea o high blood pressure o muscle aches These are not all the possible side effects of PAXLOVID. Not many people have taken PAXLOVID. Serious and unexpected side effects may happen. PAXLOVID is still being studied, so it is possible that all of the risks are not known at this time.  What other treatment choices are there? Veklury (remdesivir) is FDA-approved for the treatment of mild-to-moderate LMBEM-75 in certain adults and children. Talk with your doctor to see if Marijean Heath is appropriate for you. Like PAXLOVID, FDA may also allow for the emergency use  of other medicines to treat people with COVID-19. Go to https://price.info/ for information on the emergency use of other medicines that are authorized by FDA to treat people with COVID-19. Your healthcare provider may talk with you about clinical trials for which you may be eligible. It is your choice to be treated or not to be treated with PAXLOVID. Should you decide not to receive it or for your child not to receive it, it will not change your standard medical care.  What if I am pregnant or breastfeeding? There is no experience treating pregnant women or breastfeeding mothers with PAXLOVID. For a mother and unborn baby, the benefit of taking PAXLOVID may be greater than the risk from the treatment. If you are pregnant, discuss your options and specific situation with your healthcare provider. It is recommended that you use effective  barrier contraception or do not have sexual activity while taking PAXLOVID. If you are breastfeeding, discuss your options and specific situation with your healthcare provider. 4 Revised: 07 July 2020   How do I report side effects with PAXLOVID? Contact your healthcare provider if you have any side effects that bother you or do not go away. Report side effects to FDA MedWatch at SmoothHits.hu or call 1-800-FDA1088 or you can report side effects to [employer]. at the contact information provided below. Website Fax number Telephone number www.pfizersafetyreporting.com [phone] How should I store [place]? Store PAXLOVID tablets at room temperature, between 68?F to 77?F (20?C to 25?C). How can I learn more about COVID-19? ? Ask your healthcare provider. ? Visit https://jacobson-johnson.com/. ? Contact your local or state public health department. What is an Emergency Use Authorization (EUA)? The [place] FDA has made PAXLOVID available under an emergency access mechanism called an Emergency Use Authorization (EUA). The EUA is supported by a Education officer, museum and Human Service (HHS) declaration that circumstances exist to justify the emergency use of drugs and biological products during the COVID-19 pandemic. PAXLOVID for the treatment of mild-to-moderate COVID-19 in adults and children [57 years of age and older weighing at least 27 pounds (53 kg)] with positive results of direct SARS-CoV-2 viral testing, and who are at high risk for progression to severe COVID-19, including hospitalization or death, has not undergone the same type of review as an FDA-approved product. In issuing an EUA under the GEXBM-84 public health emergency, the FDA has determined, among other things, that based on the total amount of scientific evidence available including data from adequate and well-controlled clinical trials, if available, it is reasonable to believe that the  product may be effective for diagnosing, treating, or preventing COVID-19, or a serious or life-threatening disease or condition caused by COVID-19; that the known and potential benefits of the product, when used to diagnose, treat, or prevent such disease or condition, outweigh the known and potential risks of such product; and that there are no adequate, approved, and available alternatives. All of these criteria must be met to allow for the product to be used in the treatment of patients during the COVID-19 pandemic. The EUA for PAXLOVID is in effect for the duration of the COVID-19 declaration justifying emergency use of this product, unless terminated or revoked (after which the products may no longer be used under the EUA). 5 Revised: 07 July 2020     Additional Information For general questions, visit the website or call the telephone number provided below. Website Telephone number www.COVID19oralRx.com [phone] (1-877-C19-PACK) You can also go to www.pfizermedinfo.com or call [phone] for  more information. [id number] Revised: 07 July 2020

## 2021-03-20 NOTE — Progress Notes (Signed)
Virtual Visit via Video Note  I connected with [name]  on 03/20/21 at  1:20 PM EST by a video enabled telemedicine application and verified that I am speaking with the correct person using two identifiers.  Location patient: home, [place] Location provider:work or home office Persons participating in the virtual visit: patient, provider  I discussed the limitations of evaluation and management by telemedicine and the availability of in person appointments. The patient expressed understanding and agreed to proceed.   HPI:  Acute telemedicine visit for Covid19: -Onset: about 2 days ago -Symptoms include: sore throat, cough, nasal congestion, body aches, chills -DOL positive the day after thanksgiving -pt positive on two home test -Denies:CP, SOB, NVD, inability to eat/drink/get out of bed -Has tried: tylenol -Pertinent past medical history:see below -Pertinent medication allergies:  Allergies  Allergen Reactions   Hydrochlorothiazide Other (See Comments)    High Ca++   Flomax [Tamsulosin] Itching   Codeine Itching   Statins Other (See Comments)    Muscle aches   Zetia [Ezetimibe] Other (See Comments)    Muscle aches  -COVID-19 vaccine status: 2 shots and 3 boosters -GFR 83 in August -reports she can hold her tramadol - reports only takes as needed and not often  ROS: See pertinent positives and negatives per HPI.  Past Medical History:  Diagnosis Date   Bilateral lower extremity edema    per pt left ankle greater than right   Bladder tumor    CAD (coronary artery disease)    cardiac cath 01/ 2005 by dr Terrence Dupont,  non-obstructive cad;  nuclear study 07-31-2016  normal with no evidence ischemia,nuclear ef 52%   Diverticulosis of colon    Fibromyalgia    GERD (gastroesophageal reflux disease)    Hematuria    Hemorrhoids    Hiatal hernia    History of cancer chemotherapy 2000   left breast cancer   History of diverticulitis of colon    History of gastritis    History of  kidney stones    History of left breast cancer 2000   dx 2000 and s/p left mastectomy w/ node dissection;  completed chemo and radiation therapy same year 2000 and completed anti-estrogen 2005;  per pt released from oncologist 2006 and no recurrence   Hyperlipidemia    Hypertension    followed by pcp   Lower urinary tract symptoms (LUTS)    freq/ urg/ nocturia   Personal history of radiation therapy 2000   left breast cancer   Wears glasses     Past Surgical History:  Procedure Laterality Date   ABDOMINAL HYSTERECTOMY  1978   AND LYSIS ADHESIONS /  APPENDECTOMY  (still has ovaries)   ANTERIOR AND POSTERIOR REPAIR WITH SACROSPINOUS FIXATION  07-11-2008  @[facility]    AND ENTEROCELE REPAIR/ UTEROSACRAL COLPOSUSPENSION   BREAST REDUCTION SURGERY Right 2000   CARDIAC CATHETERIZATION  05-10-2003   dr Terrence Dupont   mild non-obstructive cad,  ef 55-60%   CATARACT EXTRACTION W/ INTRAOCULAR LENS  IMPLANT, BILATERAL  2015   CYST EXCISION  02-28-2020  @[facility]    right axilla   CYSTOSCOPY W/ RETROGRADES Bilateral 03/21/2020   Procedure: CYSTOSCOPY WITH RETROGRADE PYELOGRAM;  Surgeon: Remi Haggard, MD;  [facility];  Service: Urology;  Laterality: Bilateral;   CYSTOSCOPY W/ RETROGRADES Bilateral 01/09/2021   Procedure: CYSTOSCOPY WITH RETROGRADE PYELOGRAM;  Surgeon: Remi Haggard, MD;  Location: [facility];  Service: Urology;  Laterality: Bilateral;   INGUINAL HERNIA REPAIR  09/27/2011  Procedure: HERNIA REPAIR INGUINAL ADULT;  Surgeon: Gwenyth Ober, MD;  Location: [facility];  Service: General;  Laterality: Right;   KNEE ARTHROSCOPY Left 1986   MASTECTOMY WITH AXILLARY LYMPH NODE DISSECTION Left 2000   W/  RECONSTRUCTION   NASAL SINUS SURGERY  1980s   ROTATOR CUFF REPAIR Right 1985   TONSILLECTOMY  child   TRANSURETHRAL RESECTION OF BLADDER TUMOR N/A 03/21/2020   Procedure: TRANSURETHRAL RESECTION OF BLADDER TUMOR (TURBT);  Surgeon: Remi Haggard, MD;   Location: [facility];  Service: Urology;  Laterality: N/A;  [place] TUMOR N/A 01/09/2021   Procedure: TRANSURETHRAL RESECTION OF BLADDER TUMOR (TURBT);  Surgeon: Remi Haggard, MD;  Location[facility];  Service: Urology;  Laterality: N/A;     Current Outpatient Medications:    acetaminophen (TYLENOL) 325 MG tablet, Take 650 mg by mouth every 6 (six) hours as needed., Disp: , Rfl:    benzonatate (TESSALON PERLES) 100 MG capsule, 1-2 capsule up to twice daily for cough, Disp: 40 capsule, Rfl: 0   BIOTIN PO, Take 1 tablet by mouth daily., Disp: , Rfl:    CALCIUM PO, Take by mouth., Disp: , Rfl:    cetirizine (ZYRTEC) 10 MG tablet, Take 10 mg by mouth daily., Disp: , Rfl:    Cholecalciferol (VITAMIN D3 PO), Take by mouth., Disp: , Rfl:    cyanocobalamin 1000 MCG tablet, Take 1,000 mcg by mouth daily., Disp: , Rfl:    losartan (COZAAR) 100 MG tablet, TAKE 1 TABLET(100 MG) BY MOUTH DAILY, Disp: 90 tablet, Rfl: 1   Magnesium 400 MG TABS, Take 400 mg by mouth daily., Disp: 90 tablet, Rfl: 3   meclizine (ANTIVERT) 25 MG tablet, Take 1 tablet (25 mg total) by mouth 3 (three) times daily as needed for dizziness., Disp: 90 tablet, Rfl: 1   meloxicam (MOBIC) 15 MG tablet, Take 1 tablet (15 mg total) by mouth daily., Disp: 30 tablet, Rfl: 0   nirmatrelvir/ritonavir EUA (PAXLOVID) 20 x 150 MG & 10 x 100MG  TABS, Take 3 tablets by mouth 2 (two) times daily for 5 days. (Take nirmatrelvir 150 mg two tablets twice daily for 5 days and ritonavir 100 mg one tablet twice daily for 5 days) Patient GFR is 83, Disp: 30 tablet, Rfl: 0   pantoprazole (PROTONIX) 40 MG tablet, Take 1 tablet (40 mg total) by mouth daily., Disp: 90 tablet, Rfl: 1   potassium chloride SA (KLOR-CON) 20 MEQ tablet, TAKE 1 TABLET(20 MEQ) BY MOUTH THREE TIMES DAILY, Disp: 270 tablet, Rfl: 1   Probiotic Product (ALIGN PO), Take 1 tablet by mouth daily. , Disp: , Rfl:     torsemide (DEMADEX) 20 MG tablet, TAKE 1 TABLET BY MOUTH  DAILY, Disp: 90 tablet, Rfl: 3   traMADol (ULTRAM) 50 MG tablet, Take 1 tablet (50 mg total) by mouth every 6 (six) hours as needed., Disp: 15 tablet, Rfl: 0  EXAM:  VITALS per patient if applicable:  GENERAL: alert, oriented, appears well and in no acute distress  HEENT: atraumatic, conjunttiva clear, no obvious abnormalities on inspection of external nose and ears  NECK: normal movements of the head and neck  LUNGS: on inspection no signs of respiratory distress, breathing rate appears normal, no obvious gross SOB, gasping or wheezing  CV: no obvious cyanosis  MS: moves all visible extremities without noticeable abnormality  PSYCH/NEURO: pleasant and cooperative, no obvious depression or anxiety, speech and thought processing grossly  intact  ASSESSMENT AND PLAN:  Discussed the following assessment and plan:  COVID-19   Discussed treatment options and risk of drug interactions, ideal treatment window, potential complications, isolation and precautions for COVID-19.  Discussed possibility of rebound with antivirals and the need to reisolate if it should occur for 5 days. Checked for/reviewed any labs done in the last 90 days with GFR listed in HPI if available.  After lengthy discussion, the patient opted for treatment with Paxlovid due to being higher risk for complications of covid or severe disease and other factors. Discussed EUA status of this drug and the fact that there is preliminary limited knowledge of risks/interactions/side effects per EUA document vs possible benefits and precautions. This information was shared with patient during the visit and also was provided in patient instructions. Also, advised that patient discuss risks/interactions and use with pharmacist/treatment team as well. The patient did want a prescription for cough, Tessalon Rx sent.  Other symptomatic care measures summarized in patient  instructions. Advised to seek prompt VV follow up or in person care if worsening, new symptoms arise, or if is not improving with treatment. Discussed options for inperson care if PCP office not available. Did let this patient know that I only do telemedicine on Tuesdays and Thursdays for [place]. Advised to schedule follow up visit with PCP or [facility] if any further questions or concerns to avoid delays in care.   I discussed the assessment and treatment plan with the patient. The patient was provided an opportunity to ask questions and all were answered. The patient agreed with the plan and demonstrated an understanding of the instructions.     Brooke Kern, DO

## 2021-03-23 ENCOUNTER — Ambulatory Visit: Payer: Medicare Other | Admitting: Family Medicine

## 2021-03-25 ENCOUNTER — Other Ambulatory Visit: Payer: Self-pay | Admitting: Internal Medicine

## 2021-03-25 DIAGNOSIS — I10 Essential (primary) hypertension: Secondary | ICD-10-CM

## 2021-03-28 ENCOUNTER — Other Ambulatory Visit: Payer: Self-pay

## 2021-03-28 DIAGNOSIS — I10 Essential (primary) hypertension: Secondary | ICD-10-CM

## 2021-03-28 MED ORDER — LOSARTAN POTASSIUM 100 MG PO TABS: ORAL_TABLET | ORAL | 1 refills | Status: DC

## 2021-03-30 NOTE — Progress Notes (Signed)
HPI: [name] is a 75 y.o. female, who is here today for 4 months follow up.   She was last seen on 11/21/20. Since her last visit she has undergone transurethral bladder tumor resection. Next appt with urologist 04/25/21.  Hypertension:  Medications: Losartan 100 mg daily. She takes Torsemide 20 mg daily prn, not frequent. Tolerating medication well, no side effects. Negative for unusual or severe headache, visual changes, exertional chest pain, dyspnea,  focal weakness, or edema.  HypoK+ on KLOR 20 meq daily.  Lab Results  Component Value Date   CREATININE 0.80 01/09/2021   BUN 5 (L) 01/09/2021   NA 143 01/09/2021   K 4.2 01/09/2021   CL 106 01/09/2021   CO2 27 11/21/2020    Lab Results  Component Value Date   ALT 6 01/11/2019   AST 14 01/11/2019   ALKPHOS 96 01/11/2019   BILITOT 0.5 01/11/2019   Hyperlipidemia: Currently on no medication.  Lab Results  Component Value Date   CHOL 304 (H) 01/11/2019   HDL 62.50 01/11/2019   LDLCALC 176 (H) 01/02/2017   LDLDIRECT 209.0 01/11/2019   TRIG 238.0 (H) 01/11/2019   CHOLHDL 5 01/11/2019   Heartburn, mid chest burning sensation,and sometime feels like food gets stuck in the meddle of chest. "Heavy" food will exacerbate symptoms. She has been on same med for years and it was helping with above symptoms. She has tried Omeprazole,Nexium, prevacid. She has also tried probiotic.  Negative for abdominal pain, nausea, vomiting, changes in bowel habits, blood in stool or melena.  Review of Systems  Constitutional:  Negative for activity change, appetite change, fever and unexpected weight change.  HENT:  Negative for mouth sores, nosebleeds and trouble swallowing.   Respiratory:  Negative for cough and wheezing.   Genitourinary:  Negative for decreased urine volume and hematuria.  Musculoskeletal:  Positive for arthralgias. Negative for gait problem and myalgias.  Skin:  Negative for rash.  Neurological:   Negative for seizures, syncope, weakness, numbness and headaches.  Psychiatric/Behavioral: Negative.    Rest of ROS, see pertinent positives sand negatives in HPI  Current Outpatient Medications on File Prior to Visit  Medication Sig Dispense Refill   acetaminophen (TYLENOL) 325 MG tablet Take 650 mg by mouth every 6 (six) hours as needed.     BIOTIN PO Take 1 tablet by mouth daily.     CALCIUM PO Take by mouth.     cetirizine (ZYRTEC) 10 MG tablet Take 10 mg by mouth daily.     Cholecalciferol (VITAMIN D3 PO) Take by mouth.     cyanocobalamin 1000 MCG tablet Take 1,000 mcg by mouth daily.     losartan (COZAAR) 100 MG tablet TAKE 1 TABLET(100 MG) BY MOUTH DAILY 90 tablet 1   Magnesium 400 MG TABS Take 400 mg by mouth daily. 90 tablet 3   meclizine (ANTIVERT) 25 MG tablet Take 1 tablet (25 mg total) by mouth 3 (three) times daily as needed for dizziness. 90 tablet 1   meloxicam (MOBIC) 15 MG tablet Take 1 tablet (15 mg total) by mouth daily. 30 tablet 0   pantoprazole (PROTONIX) 40 MG tablet Take 1 tablet (40 mg total) by mouth daily. 90 tablet 1   Probiotic Product (ALIGN PO) Take 1 tablet by mouth daily.      torsemide (DEMADEX) 20 MG tablet TAKE 1 TABLET BY MOUTH  DAILY 90 tablet 3   traMADol (ULTRAM) 50 MG tablet Take 1 tablet (50 mg total)  by mouth every 6 (six) hours as needed. 15 tablet 0   No current facility-administered medications on file prior to visit.     Past Medical History:  Diagnosis Date   Bilateral lower extremity edema    per pt left ankle greater than right   Bladder tumor    CAD (coronary artery disease)    cardiac cath 01/ 2005 by dr Terrence Dupont,  non-obstructive cad;  nuclear study 07-31-2016  normal with no evidence ischemia,nuclear ef 52%   Diverticulosis of colon    Fibromyalgia    GERD (gastroesophageal reflux disease)    Hematuria    Hemorrhoids    Hiatal hernia    History of cancer chemotherapy 2000   left breast cancer   History of diverticulitis of  colon    History of gastritis    History of kidney stones    History of left breast cancer 2000   dx 2000 and s/p left mastectomy w/ node dissection;  completed chemo and radiation therapy same year 2000 and completed anti-estrogen 2005;  per pt released from oncologist 2006 and no recurrence   Hyperlipidemia    Hypertension    followed by pcp   Lower urinary tract symptoms (LUTS)    freq/ urg/ nocturia   Personal history of radiation therapy 2000   left breast cancer   Wears glasses    Allergies  Allergen Reactions   Hydrochlorothiazide Other (See Comments)    High Ca++   Flomax [Tamsulosin] Itching   Codeine Itching   Statins Other (See Comments)    Muscle aches   Zetia [Ezetimibe] Other (See Comments)    Muscle aches    Social History   Socioeconomic History   Marital status: Widowed    Spouse name: Not on file   Number of children: Not on file   Years of education: Not on file   Highest education level: Not on file  Occupational History   Not on file  Tobacco Use   Smoking status: Former    Years: 15.00    Types: Cigarettes    Quit date: 09/20/1998    Years since quitting: 22.5   Smokeless tobacco: Never  Vaping Use   Vaping Use: Never used  Substance and Sexual Activity   Alcohol use: Yes    Alcohol/week: 0.0 standard drinks    Comment: Rare   Drug use: Never   Sexual activity: Not Currently    Birth control/protection: Surgical, Post-menopausal    Comment: 1st intercourse 75 yo-Fewer than 5 partners  Other Topics Concern   Not on file  Social History Narrative   Widowed- husband died of Alzheimers in February 17, 2013.   Social Determinants of Health   Financial Resource Strain: Medium Risk   Difficulty of Paying Living Expenses: Somewhat hard  Food Insecurity: No Food Insecurity   Worried About Charity fundraiser in the Last Year: Never true   Ran Out of Food in the Last Year: Never true  Transportation Needs: No Transportation Needs   Lack of  Transportation (Medical): No   Lack of Transportation (Non-Medical): No  Physical Activity: Insufficiently Active   Days of Exercise per Week: 3 days   Minutes of Exercise per Session: 20 min  Stress: No Stress Concern Present   Feeling of Stress : Not at all  Social Connections: Moderately Isolated   Frequency of Communication with Friends and Family: Twice a week   Frequency of Social Gatherings with Friends and Family: Twice a week  Attends Religious Services: More than 4 times per year   Active Member of Clubs or Organizations: No   Attends Archivist Meetings: Never   Marital Status: Widowed    Vitals:   04/02/21 1151  BP: 120/76  Pulse: 62  Resp: 16  SpO2: 97%   Body mass index is 32.28 kg/m.  Physical Exam Vitals and nursing note reviewed.  Constitutional:      General: She is not in acute distress.    Appearance: She is well-developed.  HENT:     Head: Normocephalic and atraumatic.     Mouth/Throat:     Mouth: Mucous membranes are moist.     Pharynx: Oropharynx is clear.  Eyes:     Conjunctiva/sclera: Conjunctivae normal.  Cardiovascular:     Rate and Rhythm: Normal rate and regular rhythm.     Pulses:          Dorsalis pedis pulses are 2+ on the right side and 2+ on the left side.     Heart sounds: No murmur heard.    Comments: Peri ankle edema,bilateral. DP pulses present. Pulmonary:     Effort: Pulmonary effort is normal. No respiratory distress.     Breath sounds: Normal breath sounds.  Abdominal:     Palpations: Abdomen is soft. There is no hepatomegaly or mass.     Tenderness: There is no abdominal tenderness.  Lymphadenopathy:     Cervical: No cervical adenopathy.  Skin:    General: Skin is warm.     Findings: No erythema or rash.  Neurological:     General: No focal deficit present.     Mental Status: She is alert and oriented to person, place, and time.     Cranial Nerves: No cranial nerve deficit.     Gait: Gait normal.   Psychiatric:     Comments: Well groomed, good eye contact.   ASSESSMENT AND PLAN:  [name] was seen today for 4 months follow-up.  Orders Placed This Encounter  Procedures   Comprehensive metabolic panel   Hemoglobin A1c   Lipid panel   Lab Results  Component Value Date   HGBA1C 6.2 04/02/2021   Lab Results  Component Value Date   CREATININE 0.71 04/02/2021   BUN 9 04/02/2021   NA 142 04/02/2021   K 4.0 04/02/2021   CL 107 04/02/2021   CO2 27 04/02/2021   Lab Results  Component Value Date   ALT 6 04/02/2021   AST 13 04/02/2021   ALKPHOS 100 04/02/2021   BILITOT 0.5 04/02/2021   Lab Results  Component Value Date   CHOL 321 (H) 04/02/2021   HDL 66.50 04/02/2021   LDLCALC 219 (H) 04/02/2021   LDLDIRECT 209.0 01/11/2019   TRIG 178.0 (H) 04/02/2021   CHOLHDL 5 04/02/2021   1. Prediabetes A healthy life style encouraged for diabetes prevention. Further recommendations according to HgA1C result.  2. Gastroesophageal reflux disease, unspecified whether esophagitis present Problem is not well controlled. She has tried different PPI's. She prefers to hold on GI evaluation for now. She agrees with trying Pepcid 40 mg at bedtime. Continue Pantoprazole 40 mg daily. GERD precautions discussed.  - famotidine (PEPCID) 40 MG tablet; Take 1 tablet (40 mg total) by mouth daily.  Dispense: 30 tablet; Refill: 3  3. Mixed hyperlipidemia Non pharmacologic treatment recommended for now. Further recommendations will be given according to 10 years CVD risk score and lipid panel numbers. She has tried statins in the past and  have caused muscle aches.  4. Hypokalemia Continue KLOR 20 meq daily.  5. Primary hypertension BP adequately controlled. Continue current management: Losartan 100 mg daily and low salt diet. Monitor BP at home.  Return in about 6 months (around 10/01/2021).  Kae Lauman G. [place], MD  [facility]. [place] office.

## 2021-04-02 ENCOUNTER — Encounter: Payer: Self-pay | Admitting: Family Medicine

## 2021-04-02 ENCOUNTER — Ambulatory Visit (INDEPENDENT_AMBULATORY_CARE_PROVIDER_SITE_OTHER): Payer: Medicare Other | Admitting: Family Medicine

## 2021-04-02 VITALS — BP 120/76 | HR 62 | Resp 16 | Ht 67.0 in | Wt 206.1 lb

## 2021-04-02 DIAGNOSIS — E782 Mixed hyperlipidemia: Secondary | ICD-10-CM | POA: Diagnosis not present

## 2021-04-02 DIAGNOSIS — R7303 Prediabetes: Secondary | ICD-10-CM | POA: Diagnosis not present

## 2021-04-02 DIAGNOSIS — K219 Gastro-esophageal reflux disease without esophagitis: Secondary | ICD-10-CM

## 2021-04-02 DIAGNOSIS — E876 Hypokalemia: Secondary | ICD-10-CM | POA: Diagnosis not present

## 2021-04-02 DIAGNOSIS — I10 Essential (primary) hypertension: Secondary | ICD-10-CM

## 2021-04-02 LAB — COMPREHENSIVE METABOLIC PANEL
ALT: 6 U/L (ref 0–35)
AST: 13 U/L (ref 0–37)
Albumin: 4.6 g/dL (ref 3.5–5.2)
Alkaline Phosphatase: 100 U/L (ref 39–117)
BUN: 9 mg/dL (ref 6–23)
CO2: 27 mEq/L (ref 19–32)
Calcium: 11 mg/dL — ABNORMAL HIGH (ref 8.4–10.5)
Chloride: 107 mEq/L (ref 96–112)
Creatinine, Ser: 0.71 mg/dL (ref 0.40–1.20)
GFR: 82.88 mL/min (ref >60.00)
Glucose, Bld: 83 mg/dL (ref 70–99)
Potassium: 4 mEq/L (ref 3.5–5.1)
Sodium: 142 mEq/L (ref 135–145)
Total Bilirubin: 0.5 mg/dL (ref 0.2–1.2)
Total Protein: 7.2 g/dL (ref 6.0–8.3)

## 2021-04-02 LAB — LIPID PANEL
Cholesterol: 321 mg/dL — ABNORMAL HIGH (ref 0–200)
HDL: 66.5 mg/dL (ref >39.00)
LDL Cholesterol: 219 mg/dL — ABNORMAL HIGH (ref 0–99)
NonHDL: 254.6
Total CHOL/HDL Ratio: 5
Triglycerides: 178 mg/dL — ABNORMAL HIGH (ref 0.0–149.0)
VLDL: 35.6 mg/dL (ref 0.0–40.0)

## 2021-04-02 LAB — HEMOGLOBIN A1C: Hgb A1c MFr Bld: 6.2 % (ref 4.6–6.5)

## 2021-04-02 MED ORDER — FAMOTIDINE 40 MG PO TABS
40.0000 mg | ORAL_TABLET | Freq: Every day | ORAL | 3 refills | Status: DC
Start: 2021-04-02 — End: 2021-04-09

## 2021-04-02 NOTE — Patient Instructions (Addendum)
A few things to remember from today's visit:   Prediabetes - Plan: Hemoglobin A1c  Mixed hyperlipidemia - Plan: Comprehensive metabolic panel, Lipid panel  Primary hypertension - Plan: Comprehensive metabolic panel  If you need refills please call your pharmacy. Do not use My Chart to request refills or for acute issues that need immediate attention.   Pepcid 40 mg at bedtime added to help with heartburn. If you are still having difficulty swallowing in 6-8 weeks, we need to consider gastro consultation. No changes in rest pf your meds.  Please be sure medication list is accurate. If a new problem present, please set up appointment sooner than planned today.

## 2021-04-03 ENCOUNTER — Other Ambulatory Visit: Payer: Self-pay | Admitting: Family Medicine

## 2021-04-03 DIAGNOSIS — E876 Hypokalemia: Secondary | ICD-10-CM

## 2021-04-03 DIAGNOSIS — I10 Essential (primary) hypertension: Secondary | ICD-10-CM

## 2021-04-09 ENCOUNTER — Other Ambulatory Visit: Payer: Self-pay

## 2021-04-09 DIAGNOSIS — K219 Gastro-esophageal reflux disease without esophagitis: Secondary | ICD-10-CM

## 2021-04-09 MED ORDER — FAMOTIDINE 40 MG PO TABS: 40.0000 mg | ORAL_TABLET | Freq: Every day | ORAL | 3 refills | Status: DC

## 2021-04-09 MED ORDER — PITAVASTATIN CALCIUM 2 MG PO TABS: 2.0000 mg | ORAL_TABLET | Freq: Every day | ORAL | 3 refills | Status: DC

## 2021-05-23 ENCOUNTER — Ambulatory Visit: Payer: Medicare Other | Admitting: Obstetrics & Gynecology

## 2021-06-21 ENCOUNTER — Other Ambulatory Visit: Payer: Self-pay

## 2021-06-21 ENCOUNTER — Encounter: Payer: Self-pay | Admitting: Obstetrics & Gynecology

## 2021-06-21 ENCOUNTER — Ambulatory Visit (INDEPENDENT_AMBULATORY_CARE_PROVIDER_SITE_OTHER): Payer: Medicare Other | Admitting: Obstetrics & Gynecology

## 2021-06-21 VITALS — BP 110/72 | HR 70 | Resp 16 | Ht 67.25 in | Wt 207.0 lb

## 2021-06-21 DIAGNOSIS — C678 Malignant neoplasm of overlapping sites of bladder: Secondary | ICD-10-CM

## 2021-06-21 DIAGNOSIS — Z853 Personal history of malignant neoplasm of breast: Secondary | ICD-10-CM

## 2021-06-21 DIAGNOSIS — Z01419 Encounter for gynecological examination (general) (routine) without abnormal findings: Secondary | ICD-10-CM | POA: Diagnosis not present

## 2021-06-21 DIAGNOSIS — Z780 Asymptomatic menopausal state: Secondary | ICD-10-CM

## 2021-06-21 NOTE — Progress Notes (Signed)
? ? ?[name] [birth year] [id number] ? ? ?History:    76 y.o. G5P5L4 ? ?RP:  Established patient presenting for annual gyn exam  ? ?HPI: Postmenopausal. Prior TVH with anterior/posterior repair for leiomyoma/menorrhagia.  No significant menopausal symptoms. No pelvic pain.  Abstinent. Pap smear Neg 03/2018.  No significant history of abnormal Pap smears.  Breasts normal. History of left breast cancer.  Prior Lt mastectomy and reconstruction.  Mammogram 12/2019. Rt mammo 02/2021 Neg.  Bladder Ca Dx in 2022 followed by Urology.  Colonoscopy 2019.  DEXA 04/2018 normal.  Next DEXA recommended 5-year interval.  BMI  32.18.  Health labs with Fam MD. ? ?Past medical history,surgical history, family history and social history were all reviewed and documented in the EPIC chart. ? ?Gynecologic History ?No LMP recorded. Patient has had a hysterectomy. ? ?Obstetric History ?OB History  ?Gravida Para Term Preterm AB Living  ?5 5       4   ?SAB IAB Ectopic Multiple Live Births  ?        5  ?  ?# Outcome Date GA Lbr Len/2nd Weight Sex Delivery Anes PTL Lv  ?5 Para           ?4 Para           ?3 Para           ?2 Para           ?1 Para           ? ? ? ?ROS: A ROS was performed and pertinent positives and negatives are included in the history. ?GENERAL: No fevers or chills. HEENT: No change in vision, no earache, sore throat or sinus congestion. NECK: No pain or stiffness. CARDIOVASCULAR: No chest pain or pressure. No palpitations. PULMONARY: No shortness of breath, cough or wheeze. GASTROINTESTINAL: No abdominal pain, nausea, vomiting or diarrhea, melena or bright red blood per rectum. GENITOURINARY: No urinary frequency, urgency, hesitancy or dysuria. MUSCULOSKELETAL: No joint or muscle pain, no back pain, no recent trauma. DERMATOLOGIC: No rash, no itching, no lesions. ENDOCRINE: No polyuria, polydipsia, no heat or cold intolerance. No recent change in weight. HEMATOLOGICAL: No anemia or easy bruising or bleeding.  NEUROLOGIC: No headache, seizures, numbness, tingling or weakness. PSYCHIATRIC: No depression, no loss of interest in normal activity or change in sleep pattern.  ?  ? ?Exam: ? ? ?BP 110/72   Pulse 70   Resp 16   Ht 5' 7.25" (1.708 m)   Wt 207 lb (93.9 kg)   BMI 32.18 kg/m?  ? ?Body mass index is 32.18 kg/m?. ? ?General appearance : Well developed well nourished female. No acute distress ?HEENT: Eyes: no retinal hemorrhage or exudates,  Neck supple, trachea midline, no carotid bruits, no thyroidmegaly ?Lungs: Clear to auscultation, no rhonchi or wheezes, or rib retractions  ?Heart: Regular rate and rhythm, no murmurs or gallops ?Breast:Examined in sitting and supine position were symmetrical in appearance, no palpable masses or tenderness,  no skin retraction, no nipple inversion, no nipple discharge, no skin discoloration, no axillary or supraclavicular lymphadenopathy ?Abdomen: no palpable masses or tenderness, no rebound or guarding ?Extremities: no edema or skin discoloration or tenderness ? ?Pelvic: Vulva: Normal ?            Vagina: No gross lesions or discharge ? Cervix/Uterus absent ? Adnexa  Without masses or tenderness ? Anus: Normal ? ? ?Assessment/Plan:  76 y.o. female for annual exam  ? ?1. Well female exam with routine gynecological  exam ?Postmenopausal. Prior TVH with anterior/posterior repair for leiomyoma/menorrhagia.  No significant menopausal symptoms. No pelvic pain.  Abstinent. Pap smear Neg 03/2018.  No significant history of abnormal Pap smears.  Breasts normal. History of left breast cancer.  Prior Lt mastectomy and reconstruction.  Mammogram 12/2019. Rt mammo 02/2021 Neg.  Bladder Ca Dx in 2022 followed by Urology.  Colonoscopy 2019.  DEXA 04/2018 normal.  Next DEXA recommended 5-year interval.  BMI  32.18.  Health labs with Fam MD. ? ?2. Postmenopause ?Postmenopausal. Prior TVH with anterior/posterior repair for leiomyoma/menorrhagia.  No significant menopausal symptoms. No pelvic pain.   Abstinent. ? ?3. History of breast cancer ?S/P Lt mastectomy/Reconstruction. ? ?4. Malignant neoplasm of overlapping sites of bladder [facility])  ?Dx in 2022.  Followed by Urology. ? ?Princess Bruins MD, 4:10 PM 06/21/2021 ? ?  ?

## 2021-07-20 ENCOUNTER — Telehealth: Payer: Self-pay | Admitting: Cardiovascular Disease

## 2021-07-20 NOTE — Telephone Encounter (Signed)
Pt c/o of Chest Pain: STAT if CP now or developed within 24 hours ? ?1. Are you having CP right now? Yes pain behind her breast ? ?2. Are you experiencing any other symptoms (ex. SOB, nausea, vomiting, sweating)? no ? ?3. How long have you been experiencing CP? For about a week  ? ?4. Is your CP continuous or coming and going? Constant... feels like indigestion  ? ?5. Have you taken Nitroglycerin? no ?? ? ?

## 2021-07-20 NOTE — Telephone Encounter (Signed)
Spoke with pt she states that she is feeling pain under the left breast. She states that she did have breast cancer on that side in 2000 and it does not feel like that. She has not had any injuries to that side. She states that she does not think that it is indigestion as she take Pepcid daily she states that she took some Ibuprofen and it did relieve some of the pain but it did not relieve. She is unsure if this is chest pain, she states that she has taken nitro before but does not have any on hand and this is no longer on her medication list either. She states that she will call Dr Doug Sou (PCP) and see what she says, just in case this is indigestion, and not chest pain. She states that she does not want to go to the ER at the present time she will see what PCP tells her to do. Will call back if anything else is needed from [place].   ?

## 2021-07-24 ENCOUNTER — Other Ambulatory Visit: Payer: Self-pay

## 2021-07-24 ENCOUNTER — Other Ambulatory Visit: Payer: Self-pay | Admitting: Urology

## 2021-07-24 ENCOUNTER — Encounter (HOSPITAL_BASED_OUTPATIENT_CLINIC_OR_DEPARTMENT_OTHER): Payer: Self-pay | Admitting: Urology

## 2021-07-24 NOTE — Progress Notes (Addendum)
Spoke w/ via phone for pre-op interview---pt ?Lab needs dos----I stat              ?Lab results-----ekg 01-09-2021 chart/epic- ?COVID test -----patient states asymptomatic no test needed ?Arrive at -------830 am 07-31-2021 ?NPO after MN NO Solid Food.  Clear liquids from MN until---730 am ?Med rec completed ?Medications to take morning of surgery -----Certrizine, Rosuvastatin, meclizine prn ?Diabetic medication -----n/a ?Patient instructed no nail polish to be worn day of surgery ?Patient instructed to bring photo id and insurance card day of surgery ?Patient aware to have Driver (ride )  sister Insurance account manager driver and 1 other sister coming will stay / caregiver daughter     for 24 hours after surgery  ?Patient Special Instructions -----pt has orange S [employer] nail polish will try to remove 1 nail on each hand ?Pre-Op special Istructions -----none ?Patient verbalized understanding of instructions that were given at this phone interview. ?Patient denies shortness of breath, chest pain, fever, cough at this phone interview.  ? ?11-10-2019 echo epic ef 60 to 65 % ?07-31-2016 gated stress epic low risk ?Monitor report 11-05-2019 epic pre dominant rhythm sinus rhythm ?[facility] cardiology 08-03-2019 [facility]  f /u in 12 months ? ?Patient states had chest pain 07-17-2021 due to eating yams caused indigestion  no chest pain since  07-17-2021 per pt on 07-24-2021. ? ?Addendum: Spoke with dr Macario Golds mda and reviewed recent chest pain pt felt due to paused by indigestion  and no chest pain since 08-17-2021 per patient and last cardiology f/u 08-03-2019, pt ok for [facility] surgery 07-24-09-2023 per dr Macario Golds mda. ?

## 2021-07-30 NOTE — H&P (Signed)
76 year old female with a past medical hisotry of Paget's disease of the breast, underwent chemo and radiation, past smoker, and nephrolithiasis. She was last in our office in 2019 and saw Dr. Sherryl Barters. She reports 1 year of intermittent dysuria, worsening frequency, intermittent gross hematuria and incontinence associated with urgency. She dos not feel her urinary stream is strong and reports that it also splits. She does not feel she empties well. She denies fevers and chills. She denies passage of stone material. She currently has lower leg edema, non pitting. She has been checked by cardiology for this. Urinalysis is clear and PVR is .   -03/02/20-patient with history of intermittent dysuria and gross hematuria with incontinence as above. Underwent CT renal protocol scan 02/17/2020 and showed full very small nonobstructing right lower pole 1 mm stone otherwise normal upper urinary tract. See report below. Here for cysto to assess bladder.  Cysto performed today shows small papillary lesions in the left floor the bladder suspicious for low-grade transitional cell tumor measuring 1-2 cm x 2 lesions. Functional capacity was approximately 250 cc and bonney/Marshalll test was negative with Valsalva. There is a stress mediated contraction causing leakage in the standing position.  CLINICAL DATA: 76 year old female with history of gross hematuria.   EXAM:  CT ABDOMEN AND PELVIS WITHOUT AND WITH CONTRAST   TECHNIQUE:  Multidetector CT imaging of the abdomen and pelvis was performed  following the standard protocol before and following the bolus  administration of intravenous contrast.   CONTRAST: 125 mL of Omnipaque 300.   COMPARISON: CT the abdomen and pelvis 11/18/2017.   FINDINGS:  Lower chest: Atherosclerotic calcifications in the descending  thoracic aorta, as well as the left main, left anterior descending  and right coronary arteries.   Hepatobiliary: No suspicious cystic or solid  hepatic lesions. No  intra or extrahepatic biliary ductal dilatation. Gallbladder is  normal in appearance.   Pancreas: No pancreatic mass. No pancreatic ductal dilatation. No  pancreatic or peripancreatic fluid collections or inflammatory  changes.   Spleen: Unremarkable.   Adrenals/Urinary Tract: 1 mm nonobstructive of the right kidney  calculus in the lower pole collecting system. No additional calculi  are noted within the left renal collecting system, along the course  of either ureter, or within the lumen of the urinary bladder. No  hydroureteronephrosis. Subcentimeter low-attenuation lesions in both  kidneys, too small to characterize, but statistically likely to  represent tiny cysts. No definite aggressive appearing renal  lesions. On postcontrast delayed images there are no definite  filling defects within the collecting system of either kidney, along  the course of either ureter, or within the lumen of the urinary  bladder to strongly suggest the presence of urothelial neoplasm at  this time. Urinary bladder is under distended, but otherwise  unremarkable in appearance. Bilateral adrenal glands are normal in  appearance.   Stomach/Bowel: Normal appearance of the stomach. No pathologic  dilatation of small bowel or colon. Numerous colonic diverticulae  are noted, particularly in the sigmoid colon and descending colon,  without surrounding inflammatory changes to suggest an acute  diverticulitis at this time. The appendix is not confidently  identified and may be surgically absent. Regardless, there are no  inflammatory changes noted adjacent to the cecum to suggest the  presence of an acute appendicitis at this time.   Vascular/Lymphatic: Aortic atherosclerosis, without evidence of  aneurysm or dissection in the abdominal or pelvic vasculature. No  lymphadenopathy noted in the abdomen or pelvis.  Reproductive: Status post hysterectomy. Ovaries are not confidently   identified may be surgically absent or atrophic.   Other: No significant volume of ascites. No pneumoperitoneum.   Musculoskeletal: There are no aggressive appearing lytic or blastic  lesions noted in the visualized portions of the skeleton.   IMPRESSION:  1. 1 mm nonobstructive calculus in the lower pole collecting system  of the right kidney. No ureteral stones or findings of urinary tract  obstruction are noted at this time.  2. Colonic diverticulosis without evidence of acute diverticulitis  at this time.  3. Aortic atherosclerosis, in addition to left main and 2 vessel  coronary artery disease. Assessment for potential risk factor  modification, dietary therapy or pharmacologic therapy may be  warranted, if clinically indicated.  4. Additional incidental findings, as above.   CLINICAL DATA: 76 year old female with history of gross hematuria.   EXAM:  CT ABDOMEN AND PELVIS WITHOUT AND WITH CONTRAST   TECHNIQUE:  Multidetector CT imaging of the abdomen and pelvis was performed  following the standard protocol before and following the bolus  administration of intravenous contrast.   CONTRAST: 125 mL of Omnipaque 300.   COMPARISON: CT the abdomen and pelvis 11/18/2017.   FINDINGS:  Lower chest: Atherosclerotic calcifications in the descending  thoracic aorta, as well as the left main, left anterior descending  and right coronary arteries.   Hepatobiliary: No suspicious cystic or solid hepatic lesions. No  intra or extrahepatic biliary ductal dilatation. Gallbladder is  normal in appearance.   Pancreas: No pancreatic mass. No pancreatic ductal dilatation. No  pancreatic or peripancreatic fluid collections or inflammatory  changes.   Spleen: Unremarkable.   Adrenals/Urinary Tract: 1 mm nonobstructive of the right kidney  calculus in the lower pole collecting system. No additional calculi  are noted within the left renal collecting system, along the course  of  either ureter, or within the lumen of the urinary bladder. No  hydroureteronephrosis. Subcentimeter low-attenuation lesions in both  kidneys, too small to characterize, but statistically likely to  represent tiny cysts. No definite aggressive appearing renal  lesions. On postcontrast delayed images there are no definite  filling defects within the collecting system of either kidney, along  the course of either ureter, or within the lumen of the urinary  bladder to strongly suggest the presence of urothelial neoplasm at  this time. Urinary bladder is under distended, but otherwise  unremarkable in appearance. Bilateral adrenal glands are normal in  appearance.   Stomach/Bowel: Normal appearance of the stomach. No pathologic  dilatation of small bowel or colon. Numerous colonic diverticulae  are noted, particularly in the sigmoid colon and descending colon,  without surrounding inflammatory changes to suggest an acute  diverticulitis at this time. The appendix is not confidently  identified and may be surgically absent. Regardless, there are no  inflammatory changes noted adjacent to the cecum to suggest the  presence of an acute appendicitis at this time.   Vascular/Lymphatic: Aortic atherosclerosis, without evidence of  aneurysm or dissection in the abdominal or pelvic vasculature. No  lymphadenopathy noted in the abdomen or pelvis.   Reproductive: Status post hysterectomy. Ovaries are not confidently  identified may be surgically absent or atrophic.   Other: No significant volume of ascites. No pneumoperitoneum.   Musculoskeletal: There are no aggressive appearing lytic or blastic  lesions noted in the visualized portions of the skeleton.   IMPRESSION:  1. 1 mm nonobstructive calculus in the lower pole collecting system  of the  right kidney. No ureteral stones or findings of urinary tract  obstruction are noted at this time.  2. Colonic diverticulosis without evidence of acute  diverticulitis  at this time.  3. Aortic atherosclerosis, in addition to left main and 2 vessel  coronary artery disease. Assessment for potential risk factor  modification, dietary therapy or pharmacologic therapy may be  warranted, if clinically indicated.  4. Additional incidental findings, as above.   -03/29/20-patient with history of hematuria and was found to have papillary bladder tumor. Underwent cysto TURBT on 03/21/2020. The lesions were completely removed utilizing rigid biopsy forceps with deep biopsies showing some fat. Base was cauterized. The pathology shows high-grade noninvasive urothelial carcinoma. No postoperative issues. Here to discuss next steps of management.  -06/28/20-patient with history of high-grade superficial transitional cell carcinoma of the bladder diagnosed with TURBT on 03/21/2020. No interim GU issues. Here for follow-up surveillance cystoscopy and cytology.  Cysto performed today and shows: No evidence of recurrent bladder lesions   -09/29/20-patient with history of superficial high-grade transitional cell carcinoma of the bladder diagnosed with TURBT 03/21/2020. Has had negative surveillance cysto back in March of 2022. No interim GU issues here for follow-up surveillance cysto.  Cysto is performed today and shows: Normal bladder without evidence of mucosal lesions. Slight erythema in the left bladder base but it looked more like hypervascularity not a recurrent lesion.  Micro urinalysis shows 6-10 epithelial cells no significant hematuria.  -12/28/20-patient with history of superficial high-grade transitional cell carcinoma bladder diagnosed via TURBT in November 21. Has had negative surveillance cysto since that time. No interim GU issues.  Micro urinalysis  Cysto is performed today and shows: What appears to be a small slightly raised recurrent area on the left lateral bladder wall. Remainder of the bladder. Grossly normal.  -01/15/21-patient with history of  superficial high-grade urothelial carcinoma reason diagnosed November of 2021. Had recurrent lesion on surveillance cysto in underwent cysto TURBT on 01/09/2021. Pathology shows noninvasive high-grade recurrent lesion. No postoperative issues. Here to discuss next steps of management.  -04/25/21-patient with history of superficial high-grade urothelial carcinoma diagnosed originally in November 2021 with recurrent lesion September 2022. Subsequently is undergone intravesical BCG completed 03/13/2021. Here now for follow-up surveillance cystoscopy.  Cystoscopy is performed today and shows: No evidence of recurrent mucosal lesions  -07/23/21-patient with history of superficial high-grade urothelial carcinoma diagnosed originally in November 2021 with recurrent lesion in September 22. Subsequently underwent BCG intravesical therapy completed 03/13/2021. No interim GU issues. Here for follow-up surveillance cystoscopy  Micro urinalysis is clear on urine spun sediment  Cystoscopy is performed today and shows: What appears to be probable local recurrence near the dome of the bladder probably 1 to 2 cm in size slightly raised and adjacent to prior TURBT site. Remainder of the bladder appeared without recurrent lesions.     ALLERGIES: CODEINE Statins Tamsulosin - Itching (Moderate to Severe)    MEDICATIONS: Align  Losartan Potassium  Meclizine Hcl  Ondansetron Odt 8 mg tablet,disintegrating 1 tablet PO Q8PRN post-op nausea  Potassium  Protonix  Tramadol Hcl     GU PSH: Bladder Instill AntiCA Agent - 03/13/2021, 03/06/2021, 02/27/2021, 02/20/2021, 02/13/2021, 02/06/2021 Cystoscopy - 04/25/2021, 12/28/2020, 09/29/2020, 06/28/2020, 03/03/2020 Cystoscopy TURBT 2-5 cm - 01/09/2021, 03/21/2020 Hysterectomy Locm 300-399Mg /Ml Iodine,1Ml - 02/17/2020       PSH Notes: Right Cyst under arm removed  Right Mole removed off Nipple    NON-GU PSH: Breast Reconstruction Hernia Repair Mastectomy, Simple, Complete,  Left Tonsillectomy  GU PMH: Bladder Cancer overlapping sites - 04/25/2021, - 03/13/2021, - 03/06/2021, - 02/27/2021, - 02/20/2021, - 02/13/2021, - 02/06/2021, - 01/15/2021, - 12/28/2020, - 09/29/2020, - 06/28/2020, - 03/29/2020 Mixed incontinence - 09/29/2020, - 03/03/2020 Bladder tumor/neoplasm - 03/03/2020 Gross hematuria - 03/03/2020, - 02/17/2020 Dysuria - 02/08/2020 Incomplete bladder emptying - 02/08/2020 Splitting of urinary stream - 02/08/2020 Urinary Frequency - 02/08/2020 Weak Urinary Stream - 02/08/2020 Ureteral calculus - 2019    NON-GU PMH: Pyuria/other UA findings - 01/15/2021 Breast Cancer, History GERD Hypercholesterolemia Hypertension    FAMILY HISTORY: 2 daughters - Daughter 2 sons - Son   SOCIAL HISTORY: Marital Status: Widowed Preferred Language: English Current Smoking Status: Patient does not smoke anymore.   Tobacco Use Assessment Completed: Used Tobacco in last 30 days? Has never drank.  Drinks 1 caffeinated drink per day. Patient's occupation is/was Retired.    REVIEW OF SYSTEMS:    GU Review Female:   Patient denies frequent urination, hard to postpone urination, burning /pain with urination, get up at night to urinate, leakage of urine, stream starts and stops, trouble starting your stream, have to strain to urinate, and being pregnant.  Gastrointestinal (Upper):   Patient denies nausea, vomiting, and indigestion/ heartburn.  Gastrointestinal (Lower):   Patient denies diarrhea and constipation.  Constitutional:   Patient denies fever, night sweats, weight loss, and fatigue.  Skin:   Patient denies skin rash/ lesion and itching.  Eyes:   Patient denies blurred vision and double vision.  Ears/ Nose/ Throat:   Patient denies sore throat and sinus problems.  Hematologic/Lymphatic:   Patient denies swollen glands and easy bruising.  Cardiovascular:   Patient denies chest pains and leg swelling.  Respiratory:   Patient denies cough and shortness of breath.   Endocrine:   Patient denies excessive thirst.  Musculoskeletal:   Patient denies back pain and joint pain.  Neurological:   Patient denies headaches and dizziness.  Psychologic:   Patient denies depression and anxiety.   VITAL SIGNS: None   GU PHYSICAL EXAMINATION:    Urethral Meatus: Normal size. Normal position. No discharge.  Urethra: No tenderness, no mass, no scarring. No hypermobility. No leakage.  Bladder: Normal to palpation, no tenderness, no mass, normal size.  Vagina: No atrophy, no stenosis. No rectocele. No cystocele. No enterocele.   MULTI-SYSTEM PHYSICAL EXAMINATION:    Constitutional: Well-nourished. No physical deformities. Normally developed. Good grooming.  Neck: Neck symmetrical, not swollen. Normal tracheal position.  Respiratory: No labored breathing, no use of accessory muscles.   Cardiovascular: Normal temperature, normal extremity pulses, no swelling, no varicosities.  Lymphatic: No enlargement of neck, axillae, groin.  Skin: No paleness, no jaundice, no cyanosis. No lesion, no ulcer, no rash.  Neurologic / Psychiatric: Oriented to time, oriented to place, oriented to person. No depression, no anxiety, no agitation.  Eyes: Normal conjunctivae. Normal eyelids.  Ears, Nose, Mouth, and Throat: Left ear no scars, no lesions, no masses. Right ear no scars, no lesions, no masses. Nose no scars, no lesions, no masses. Normal hearing. Normal lips.  Musculoskeletal: Normal gait and station of head and neck.     PAST DATA REVIEW: None   PROCEDURES:         Flexible Cystoscopy - 52000  Risks, benefits, and some of the potential complications of the procedure were discussed at length with the patient including infection, bleeding, voiding discomfort, urinary retention, fever, chills, sepsis, and others. All questions were answered. Informed consent was obtained. Antibiotic prophylaxis was given. Sterile technique  and intraurethral analgesia were used.  Meatus:  Normal  size. Normal location. Normal condition.  Urethra:  No hypermobility. No leakage.  Ureteral Orifices:  Normal location. Normal size. Normal shape. Effluxed clear urine.  Bladder:  Cystoscopy is performed today and shows: What appears to be probable local recurrence near the dome of the bladder probably 1 to 2 cm in size slightly raised and adjacent to prior TURBT site. Remainder of the bladder appeared without recurrent lesions.      The lower urinary tract was carefully examined. The procedure was well-tolerated and without complications. Antibiotic instructions were given. Instructions were given to call the office immediately for bloody urine, difficulty urinating, urinary retention, painful or frequent urination, fever, chills, nausea, vomiting or other illness. The patient stated that she understood these instructions and would comply with them.         Urinalysis - 81003 Dipstick Dipstick Cont'd  Color: Yellow Bilirubin: Neg mg/dL  Appearance: Clear Ketones: Neg mg/dL  Specific Gravity: 6.578 Blood: Neg ery/uL  pH: 5.5 Protein: Trace mg/dL  Glucose: Neg mg/dL Urobilinogen: 0.2 mg/dL    Nitrites: Neg    Leukocyte Esterase: Neg leu/uL    ASSESSMENT:      ICD-10 Details  1 GU:   Bladder Cancer overlapping sites - C67.8 Acute, Complicated Injury   PLAN:           Document Letter(s):  Created for Patient: Clinical Summary         Notes:   I discussed cystoscopic findings and she saw it on the monitor today during cystoscopy. Recommended cystoscopy TURBT with bilateral retrogrades. Also plan for gemcitabine instillation. Risk and benefits discussed as outlined below.  TURBT consent: I have discussed with the patient the risks, benefits of TURBT which include but are not limited to: Bleeding, infection, damage to the bladder with potential perforation of the bladder, damage to surrounding organs, possible need for further procedures including open repair and catheterization, possibility  of nonhealing area within the bladder, urgency, frequency which may be refractory to medications. I pointed out that in some occasions after resection of the bladder tumor, mitomycin-C chemotherapy may be instilled into the bladder. The risks associated with this therapy include but are not limited to: Refractory or new onset urgency, frequency, dysuria, infrequently severe systemic side effects secondary to mitomycin-C. After full discussion of the risks, benefits and alternatives, the patient has consented to the above procedure and desires to proceed.

## 2021-07-31 ENCOUNTER — Encounter (HOSPITAL_BASED_OUTPATIENT_CLINIC_OR_DEPARTMENT_OTHER)
Admission: RE | Disposition: A | Discharge status: Home / Self Care | Payer: Self-pay | Source: Ambulatory Visit | Attending: Urology

## 2021-07-31 ENCOUNTER — Encounter (HOSPITAL_BASED_OUTPATIENT_CLINIC_OR_DEPARTMENT_OTHER): Payer: Self-pay | Admitting: Urology

## 2021-07-31 ENCOUNTER — Ambulatory Visit (HOSPITAL_BASED_OUTPATIENT_CLINIC_OR_DEPARTMENT_OTHER)
Admission: RE | Admit: 2021-07-31 | Discharge: 2021-07-31 | Disposition: A | Discharge status: Home / Self Care | Payer: Medicare Other | Source: Ambulatory Visit | Attending: Urology | Admitting: Urology

## 2021-07-31 ENCOUNTER — Ambulatory Visit (HOSPITAL_BASED_OUTPATIENT_CLINIC_OR_DEPARTMENT_OTHER): Payer: Medicare Other | Admitting: Anesthesiology

## 2021-07-31 DIAGNOSIS — K219 Gastro-esophageal reflux disease without esophagitis: Secondary | ICD-10-CM | POA: Insufficient documentation

## 2021-07-31 DIAGNOSIS — M797 Fibromyalgia: Secondary | ICD-10-CM

## 2021-07-31 DIAGNOSIS — Z9221 Personal history of antineoplastic chemotherapy: Secondary | ICD-10-CM | POA: Diagnosis not present

## 2021-07-31 DIAGNOSIS — I2510 Atherosclerotic heart disease of native coronary artery without angina pectoris: Secondary | ICD-10-CM | POA: Insufficient documentation

## 2021-07-31 DIAGNOSIS — Z79899 Other long term (current) drug therapy: Secondary | ICD-10-CM | POA: Insufficient documentation

## 2021-07-31 DIAGNOSIS — I10 Essential (primary) hypertension: Secondary | ICD-10-CM | POA: Diagnosis not present

## 2021-07-31 DIAGNOSIS — F32A Depression, unspecified: Secondary | ICD-10-CM | POA: Diagnosis not present

## 2021-07-31 DIAGNOSIS — I700 Atherosclerosis of aorta: Secondary | ICD-10-CM | POA: Diagnosis not present

## 2021-07-31 DIAGNOSIS — C679 Malignant neoplasm of bladder, unspecified: Secondary | ICD-10-CM

## 2021-07-31 DIAGNOSIS — M1990 Unspecified osteoarthritis, unspecified site: Secondary | ICD-10-CM | POA: Diagnosis not present

## 2021-07-31 DIAGNOSIS — K5730 Diverticulosis of large intestine without perforation or abscess without bleeding: Secondary | ICD-10-CM | POA: Diagnosis not present

## 2021-07-31 DIAGNOSIS — Z853 Personal history of malignant neoplasm of breast: Secondary | ICD-10-CM | POA: Insufficient documentation

## 2021-07-31 DIAGNOSIS — Z923 Personal history of irradiation: Secondary | ICD-10-CM | POA: Insufficient documentation

## 2021-07-31 DIAGNOSIS — Z87891 Personal history of nicotine dependence: Secondary | ICD-10-CM | POA: Insufficient documentation

## 2021-07-31 HISTORY — DX: Presence of dental prosthetic device (complete) (partial): Z97.2

## 2021-07-31 HISTORY — DX: Gastro-esophageal reflux disease without esophagitis: K21.9

## 2021-07-31 HISTORY — PX: CYSTOSCOPY W/ RETROGRADES: SHX1426

## 2021-07-31 HISTORY — PX: TRANSURETHRAL RESECTION OF BLADDER TUMOR WITH MITOMYCIN-C: SHX6459

## 2021-07-31 HISTORY — DX: Dermatitis, unspecified: L30.9

## 2021-07-31 LAB — POCT I-STAT, CHEM 8
BUN: 9 mg/dL (ref 8–23)
Calcium, Ion: 1.43 mmol/L — ABNORMAL HIGH (ref 1.15–1.40)
Chloride: 106 mmol/L (ref 98–111)
Creatinine, Ser: 0.7 mg/dL (ref 0.44–1.00)
Glucose, Bld: 99 mg/dL (ref 70–99)
HCT: 40 % (ref 36.0–46.0)
Hemoglobin: 13.6 g/dL (ref 12.0–15.0)
Potassium: 4.7 mmol/L (ref 3.5–5.1)
Sodium: 144 mmol/L (ref 135–145)
TCO2: 29 mmol/L (ref 22–32)

## 2021-07-31 SURGERY — TRANSURETHRAL RESECTION OF BLADDER TUMOR WITH MITOMYCIN-C
Anesthesia: General | Site: Bladder

## 2021-07-31 MED ORDER — MEPERIDINE HCL 25 MG/ML IJ SOLN: 6.2500 mg | INTRAMUSCULAR | Status: DC | PRN

## 2021-07-31 MED ORDER — PROPOFOL 10 MG/ML IV BOLUS
INTRAVENOUS | Status: AC
Start: 2021-07-31 — End: ?
  Filled 2021-07-31: qty 20

## 2021-07-31 MED ORDER — ONDANSETRON HCL 4 MG/2ML IJ SOLN
INTRAMUSCULAR | Status: AC
  Filled 2021-07-31: qty 2

## 2021-07-31 MED ORDER — ACETAMINOPHEN 160 MG/5ML PO SOLN: 325.0000 mg | ORAL | Status: DC | PRN

## 2021-07-31 MED ORDER — PROPOFOL 1000 MG/100ML IV EMUL
INTRAVENOUS | Status: AC
  Filled 2021-07-31: qty 100

## 2021-07-31 MED ORDER — PROPOFOL 500 MG/50ML IV EMUL
INTRAVENOUS | Status: DC | PRN
  Administered 2021-07-31: 25 ug/kg/min via INTRAVENOUS

## 2021-07-31 MED ORDER — FENTANYL CITRATE (PF) 100 MCG/2ML IJ SOLN: 25.0000 ug | INTRAMUSCULAR | Status: DC | PRN

## 2021-07-31 MED ORDER — ACETAMINOPHEN 325 MG PO TABS: 325.0000 mg | ORAL_TABLET | ORAL | Status: DC | PRN

## 2021-07-31 MED ORDER — ONDANSETRON HCL 4 MG/2ML IJ SOLN
INTRAMUSCULAR | Status: DC | PRN
  Administered 2021-07-31: 4 mg via INTRAVENOUS

## 2021-07-31 MED ORDER — DEXAMETHASONE SODIUM PHOSPHATE 10 MG/ML IJ SOLN
INTRAMUSCULAR | Status: AC
  Filled 2021-07-31: qty 1

## 2021-07-31 MED ORDER — TRAMADOL HCL 50 MG PO TABS: 50.0000 mg | ORAL_TABLET | Freq: Four times a day (QID) | ORAL | 0 refills | Status: DC | PRN

## 2021-07-31 MED ORDER — OXYCODONE HCL 5 MG PO TABS: 5.0000 mg | ORAL_TABLET | Freq: Once | ORAL | Status: DC | PRN

## 2021-07-31 MED ORDER — LACTATED RINGERS IV SOLN: INTRAVENOUS | Status: DC

## 2021-07-31 MED ORDER — IOHEXOL 300 MG/ML  SOLN
INTRAMUSCULAR | Status: DC | PRN
Start: 2021-07-31 — End: 2021-07-31
  Administered 2021-07-31: 16 mL

## 2021-07-31 MED ORDER — LIDOCAINE HCL (PF) 2 % IJ SOLN
INTRAMUSCULAR | Status: AC
  Filled 2021-07-31: qty 5

## 2021-07-31 MED ORDER — FENTANYL CITRATE (PF) 100 MCG/2ML IJ SOLN
INTRAMUSCULAR | Status: DC | PRN
  Administered 2021-07-31 (×4): 25 ug via INTRAVENOUS

## 2021-07-31 MED ORDER — CEFAZOLIN SODIUM-DEXTROSE 2-4 GM/100ML-% IV SOLN
2.0000 g | INTRAVENOUS | Status: AC
  Administered 2021-07-31: 2 g via INTRAVENOUS

## 2021-07-31 MED ORDER — PHENYLEPHRINE 40 MCG/ML (10ML) SYRINGE FOR IV PUSH (FOR BLOOD PRESSURE SUPPORT)
PREFILLED_SYRINGE | INTRAVENOUS | Status: AC
  Filled 2021-07-31: qty 10

## 2021-07-31 MED ORDER — PROPOFOL 10 MG/ML IV BOLUS
INTRAVENOUS | Status: DC | PRN
  Administered 2021-07-31: 120 mg via INTRAVENOUS

## 2021-07-31 MED ORDER — 0.9 % SODIUM CHLORIDE (POUR BTL) OPTIME
TOPICAL | Status: DC | PRN
  Administered 2021-07-31: 500 mL

## 2021-07-31 MED ORDER — DEXAMETHASONE SODIUM PHOSPHATE 4 MG/ML IJ SOLN
INTRAMUSCULAR | Status: DC | PRN
  Administered 2021-07-31: 5 mg via INTRAVENOUS

## 2021-07-31 MED ORDER — ONDANSETRON HCL 4 MG/2ML IJ SOLN: 4.0000 mg | Freq: Once | INTRAMUSCULAR | Status: DC | PRN

## 2021-07-31 MED ORDER — STERILE WATER FOR IRRIGATION IR SOLN
Status: DC | PRN
  Administered 2021-07-31: 6000 mL

## 2021-07-31 MED ORDER — FENTANYL CITRATE (PF) 100 MCG/2ML IJ SOLN
INTRAMUSCULAR | Status: AC
  Filled 2021-07-31: qty 2

## 2021-07-31 MED ORDER — GEMCITABINE CHEMO FOR BLADDER INSTILLATION 2000 MG
2000.0000 mg | Freq: Once | INTRAVENOUS | Status: AC
  Administered 2021-07-31: 2000 mg via INTRAVESICAL
  Filled 2021-07-31: qty 2000

## 2021-07-31 MED ORDER — CEFAZOLIN SODIUM-DEXTROSE 2-4 GM/100ML-% IV SOLN
INTRAVENOUS | Status: AC
  Filled 2021-07-31: qty 100

## 2021-07-31 MED ORDER — PHENYLEPHRINE HCL (PRESSORS) 10 MG/ML IV SOLN
INTRAVENOUS | Status: DC | PRN
  Administered 2021-07-31 (×2): 80 ug via INTRAVENOUS

## 2021-07-31 MED ORDER — OXYCODONE HCL 5 MG/5ML PO SOLN: 5.0000 mg | Freq: Once | ORAL | Status: DC | PRN

## 2021-07-31 MED ORDER — LIDOCAINE 2% (20 MG/ML) 5 ML SYRINGE
INTRAMUSCULAR | Status: DC | PRN
  Administered 2021-07-31: 60 mg via INTRAVENOUS

## 2021-07-31 SURGICAL SUPPLY — 31 items
BAG DRAIN URO-CYSTO SKYTR STRL (DRAIN) ×3 IMPLANT
BAG DRN UROCATH (DRAIN) ×2
BULB IRRIG PATHFIND (MISCELLANEOUS) ×1 IMPLANT
CATH FOLEY 2WAY SLVR  5CC 16FR (CATHETERS) ×3
CATH FOLEY 2WAY SLVR 5CC 16FR (CATHETERS) IMPLANT
CATH URET 5FR 28IN CONE TIP (BALLOONS)
CATH URET 5FR 28IN OPEN ENDED (CATHETERS) IMPLANT
CATH URET 5FR 70CM CONE TIP (BALLOONS) IMPLANT
CLOTH BEACON ORANGE TIMEOUT ST (SAFETY) ×3 IMPLANT
DRSG TELFA 3X8 NADH (GAUZE/BANDAGES/DRESSINGS) ×3 IMPLANT
ELECT REM PT RETURN 9FT ADLT (ELECTROSURGICAL) ×3
ELECTRODE REM PT RTRN 9FT ADLT (ELECTROSURGICAL) IMPLANT
FIBER LASER FLEXIVA 365 (UROLOGICAL SUPPLIES) IMPLANT
GLOVE BIO SURGEON STRL SZ7.5 (GLOVE) ×3 IMPLANT
GOWN STRL REUS W/TWL LRG LVL3 (GOWN DISPOSABLE) ×3 IMPLANT
GUIDEWIRE ANG ZIPWIRE 038X150 (WIRE) ×1 IMPLANT
GUIDEWIRE STR DUAL SENSOR (WIRE) IMPLANT
IV NS IRRIG 3000ML ARTHROMATIC (IV SOLUTION) ×3 IMPLANT
KIT TURNOVER CYSTO (KITS) ×3 IMPLANT
MANIFOLD NEPTUNE II (INSTRUMENTS) ×1 IMPLANT
NS IRRIG 500ML POUR BTL (IV SOLUTION) ×1 IMPLANT
PACK CYSTO (CUSTOM PROCEDURE TRAY) ×3 IMPLANT
PAD DRESSING TELFA 3X8 NADH (GAUZE/BANDAGES/DRESSINGS) IMPLANT
PLUG CATH AND CAP STER (CATHETERS) ×1 IMPLANT
SYR 20ML LL LF (SYRINGE) ×3 IMPLANT
SYR TOOMEY IRRIG 70ML (MISCELLANEOUS)
SYRINGE TOOMEY IRRIG 70ML (MISCELLANEOUS) IMPLANT
TRACTIP FLEXIVA PULS ID 200XHI (Laser) IMPLANT
TRACTIP FLEXIVA PULSE ID 200 (Laser)
TUBE CONNECTING 12X1/4 (SUCTIONS) ×1 IMPLANT
WATER STERILE IRR 500ML POUR (IV SOLUTION) ×1 IMPLANT

## 2021-07-31 NOTE — Op Note (Signed)
Preoperative diagnosis:  ?1.  Recurrent bladder cancer ? ?Postoperative diagnosis: ?1.  Recurrent bladder cancer ? ?Procedure(s): ?1.  Cystoscopy, bilateral retrograde pyelogram with intraoperative interpretation, transurethral section of bladder tumor (medium), intravesical instillation of gemcitabine ? ?Surgeon: Dr. Harold Barban ? ?Anesthesia: General ? ?Complications: None ? ?EBL: Minimal ? ?Specimens: ?Bladder tumor ? ?Disposition of specimens: To pathology ? ?Intraoperative findings: Approximate 2 cm area erythematous raised area in the posterior dome of the bladder.  Area was removed utilizing rigid biopsy forceps and base fulgurated.  Bilateral retrogrades performed were normal in appearance.  2 g of gemcitabine instilled in the bladder post procedurally ? ?Indication: 29 76-year-old African-American female with history of high-grade transitional cell carcinoma the bladder.  On surveillance cystoscopy had what appears to be recurrence in the posterior dome area of the bladder.  Presents at this time undergo cystoscopy bilateral retrogrades transurethral section of bladder tumor and gemcitabine instillation ? ?Description of procedure: ? ?After obtaining informed consent for the patient she was taken major cystoscopy suite placed under general anesthesia.  She is placed in the dorsolithotomy position genitalia prepped and draped in usual sterile fashion.  Proper pause and timeout was performed.  13 French cystoscope was advanced into the bladder without difficulty with the above-noted findings.  Bilateral retrograde pyelograms were performed with 5 French open tip catheter revealed normal filling of both upper tracts without evidence of filling defect or hydronephrosis.  Both upper tracts emptied out promptly upon removal the retrograde catheter. ? ?Attention was then directed towards resection of the bladder tumor.  It was felt in order to preserve pathologic integrity with minimal cautery artifact that this  area be removed utilizing rigid biopsy forceps.  Utilizing 3-4 bites of the rigid biopsy forceps the area was completely removed and pathology sent.  Biopsies were deep into the muscle.  The Bugbee electrode was then utilized to cauterize the base of this lesion with good hemostasis noted.  An area circumferentially outside the area of resection was also cauterized to ensure complete tumor removal.  Good hemostasis was noted.  62 French Foley catheter was then placed to drain the bladder.  Patient was taken back to recovery room in stable condition.  No immediate complication. ? ?Once in PACU patient had 2 g of gemcitabine placed intravesically and Placed in the Foley catheter.  Plan will be to have her hold this for an hour and then drain the bladder with removal of Foley catheter prior to discharge home.  Patient tolerated with no immediate complication. ? ? ? ? ?  ?

## 2021-07-31 NOTE — Interval H&P Note (Signed)
History and Physical Interval Note: ? ?07/31/2021 ?9:56 AM ? ?[name]  has presented today for surgery, with the diagnosis of BLADDER CANCER.  The various methods of treatment have been discussed with the patient and family. After consideration of risks, benefits and other options for treatment, the patient has consented to  Procedure(s): ?TRANSURETHRAL RESECTION OF BLADDER TUMOR WITH GEMCITABINE (N/A) ?CYSTOSCOPY WITH RETROGRADE PYELOGRAM (Bilateral) as a surgical intervention.  The patient's history has been reviewed, patient examined, no change in status, stable for surgery.  I have reviewed the patient's chart and labs.  Questions were answered to the patient's satisfaction.   ? ? ?[name] ? ? ?

## 2021-07-31 NOTE — Anesthesia Procedure Notes (Signed)
Procedure Name: LMA Insertion ?Date/Time: 07/31/2021 10:14 AM ?Performed by: Justice Rocher, CRNA ?Pre-anesthesia Checklist: Patient identified, Emergency Drugs available, Suction available, Patient being monitored and Timeout performed ?Patient Re-evaluated:Patient Re-evaluated prior to induction ?Oxygen Delivery Method: Circle system utilized ?Preoxygenation: Pre-oxygenation with 100% oxygen ?Induction Type: IV induction ?Ventilation: Mask ventilation without difficulty ?LMA: LMA inserted ?LMA Size: 4.0 ?Number of attempts: 1 ?Airway Equipment and Method: Bite block ?Placement Confirmation: positive ETCO2, breath sounds checked- equal and bilateral and CO2 detector ?Tube secured with: Tape ?Dental Injury: Teeth and Oropharynx as per pre-operative assessment  ? ? ? ? ?

## 2021-07-31 NOTE — Anesthesia Postprocedure Evaluation (Signed)
Anesthesia Post Note ? ?Patient: [name] ? ?Procedure(s) Performed: TRANSURETHRAL RESECTION OF BLADDER TUMOR WITH GEMCITABINE (Bladder) ?CYSTOSCOPY WITH RETROGRADE PYELOGRAM (Bilateral: Bladder) ? ?  ? ?Patient location during evaluation: PACU ?Anesthesia Type: General ?Level of consciousness: awake and alert ?Pain management: pain level controlled ?Vital Signs Assessment: post-procedure vital signs reviewed and stable ?Respiratory status: spontaneous breathing, nonlabored ventilation, respiratory function stable and patient connected to nasal cannula oxygen ?Cardiovascular status: blood pressure returned to baseline and stable ?Postop Assessment: no apparent nausea or vomiting ?Anesthetic complications: no ? ? ?No notable events documented. ? ?Last Vitals:  ?Vitals:  ? 07/31/21 1200 07/31/21 1215  ?BP: (!) 141/71 (!) 141/74  ?Pulse: (!) 55 (!) 54  ?Resp: 17 18  ?Temp:    ?SpO2: 100% 100%  ?  ?Last Pain:  ?Vitals:  ? 07/31/21 1200  ?TempSrc:   ?PainSc: 0-No pain  ? ? ?  ?  ?  ?  ?  ?  ? ?Harvest Stanco ? ? ? ? ?

## 2021-07-31 NOTE — Transfer of Care (Signed)
Immediate Anesthesia Transfer of Care Note ? ?Patient: [name] ? ?Procedure(s) Performed: Procedure(s) (LRB): ?TRANSURETHRAL RESECTION OF BLADDER TUMOR WITH GEMCITABINE (N/A) ?CYSTOSCOPY WITH RETROGRADE PYELOGRAM (Bilateral) ? ?Patient Location: PACU ? ?Anesthesia Type: General ? ?Level of Consciousness: awake, sedated, patient cooperative and responds to stimulation ? ?Airway & Oxygen Therapy: Patient Spontanous Breathing and Patient connected to [place] 02 and soft FM  ? ?Post-op Assessment: Report given to PACU RN, Post -op Vital signs reviewed and stable and Patient moving all extremities ? ?Post vital signs: Reviewed and stable ? ?Complications: No apparent anesthesia complications ?

## 2021-07-31 NOTE — Anesthesia Preprocedure Evaluation (Addendum)
Anesthesia Evaluation  ?Patient identified by MRN, date of birth, ID band ?Patient awake ? ? ? ?Reviewed: ?Allergy & Precautions, NPO status , Patient's Chart, lab work & pertinent test results ? ?History of Anesthesia Complications ?(+) PONV and history of anesthetic complications ? ?Airway ?Mallampati: I ? ?TM Distance: >3 FB ?Neck ROM: Full ? ? ? Dental ? ?(+) Edentulous Upper, Upper Dentures, Missing, Dental Advisory Given,  ?  ?Pulmonary ?former smoker,  ?  ?breath sounds clear to auscultation ? ? ? ? ? ? Cardiovascular ?hypertension, Pt. on medications ?(-) angina+ CAD (non-obstructive)  ? ?Rhythm:Regular Rate:Normal ? ?'21 ECHO: EF 60-65%, normal LVF, mild LVH, grade 1 DD, no significant valvular abnotmalities ?  ?Neuro/Psych ?Depression negative neurological ROS ?   ? GI/Hepatic ?Neg liver ROS, GERD  Medicated and Controlled,  ?Endo/Other  ?obese ? Renal/GU ?negative Renal ROS  ? ?Bladder cancer ? ?  ?Musculoskeletal ? ?(+) Arthritis , Osteoarthritis,  Fibromyalgia - ? Abdominal ?(+) + obese,   ?Peds ? Hematology ?negative hematology ROS ?(+)   ?Anesthesia Other Findings ?H/o breast cancer: surgery, XRT, chemo ? Reproductive/Obstetrics ? ?  ? ? ? ? ? ? ? ? ? ? ? ? ? ?  ?  ? ? ? ? ? ? ? ?Anesthesia Physical ? ?Anesthesia Plan ? ?ASA: 3 ? ?Anesthesia Plan: General  ? ?Post-op Pain Management: Minimal or no pain anticipated  ? ?Induction: Intravenous ? ?PONV Risk Score and Plan: 3 and Ondansetron, Dexamethasone, Treatment may vary due to age or medical condition and TIVA ? ?Airway Management Planned: LMA ? ?Additional Equipment: None ? ?Intra-op Plan:  ? ?Post-operative Plan:  ? ?Informed Consent: I have reviewed the patients History and Physical, chart, labs and discussed the procedure including the risks, benefits and alternatives for the proposed anesthesia with the patient or authorized representative who has indicated his/her understanding and acceptance.  ? ? ? ?Dental  advisory given ? ?Plan Discussed with: CRNA and Anesthesiologist ? ?Anesthesia Plan Comments:   ? ? ? ? ? ?Anesthesia Quick Evaluation ? ?

## 2021-07-31 NOTE — Discharge Instructions (Signed)
  Post Anesthesia Home Care Instructions  Activity: Get plenty of rest for the remainder of the day. A responsible individual must stay with you for 24 hours following the procedure.  For the next 24 hours, DO NOT: -Drive a car -Operate machinery -Drink alcoholic beverages -Take any medication unless instructed by your physician -Make any legal decisions or sign important papers.  Meals: Start with liquid foods such as gelatin or soup. Progress to regular foods as tolerated. Avoid greasy, spicy, heavy foods. If nausea and/or vomiting occur, drink only clear liquids until the nausea and/or vomiting subsides. Call your physician if vomiting continues.  Special Instructions/Symptoms: Your throat may feel dry or sore from the anesthesia or the breathing tube placed in your throat during surgery. If this causes discomfort, gargle with warm salt water. The discomfort should disappear within 24 hours.  CYSTOSCOPY HOME CARE INSTRUCTIONS  Activity: Rest for the remainder of the day.  Do not drive or operate equipment today.  You may resume normal activities in one to two days as instructed by your physician.   Meals: Drink plenty of liquids and eat light foods such as gelatin or soup this evening.  You may return to a normal meal plan tomorrow.  Return to Work: You may return to work in one to two days or as instructed by your physician.  Special Instructions / Symptoms: Call your physician if any of these symptoms occur:   -persistent or heavy bleeding  -bleeding which continues after first few urination  -large blood clots that are difficult to pass  -urine stream diminishes or stops completely  -fever equal to or higher than 101 degrees Farenheit.  -cloudy urine with a strong, foul odor  -severe pain  Females should always wipe from front to back after elimination.  You may feel some burning pain when you urinate.  This should disappear with time.  Applying moist heat to the lower  abdomen or a hot tub bath may help relieve the pain.   

## 2021-08-01 ENCOUNTER — Encounter (HOSPITAL_BASED_OUTPATIENT_CLINIC_OR_DEPARTMENT_OTHER): Payer: Self-pay | Admitting: Urology

## 2021-08-01 LAB — SURGICAL PATHOLOGY

## 2021-08-06 ENCOUNTER — Other Ambulatory Visit: Payer: Self-pay | Admitting: Internal Medicine

## 2021-08-06 DIAGNOSIS — K219 Gastro-esophageal reflux disease without esophagitis: Secondary | ICD-10-CM

## 2021-08-07 ENCOUNTER — Other Ambulatory Visit: Payer: Self-pay | Admitting: Family Medicine

## 2021-08-07 DIAGNOSIS — K219 Gastro-esophageal reflux disease without esophagitis: Secondary | ICD-10-CM

## 2021-08-26 ENCOUNTER — Other Ambulatory Visit: Payer: Self-pay | Admitting: Family Medicine

## 2021-08-26 DIAGNOSIS — I10 Essential (primary) hypertension: Secondary | ICD-10-CM

## 2021-08-28 ENCOUNTER — Other Ambulatory Visit: Payer: Self-pay | Admitting: Family Medicine

## 2021-09-11 ENCOUNTER — Ambulatory Visit (INDEPENDENT_AMBULATORY_CARE_PROVIDER_SITE_OTHER): Payer: Medicare Other | Admitting: Family Medicine

## 2021-09-11 ENCOUNTER — Encounter: Payer: Self-pay | Admitting: Family Medicine

## 2021-09-11 VITALS — BP 128/80 | HR 79 | Temp 98.9°F | Resp 16 | Ht 67.5 in | Wt 204.1 lb

## 2021-09-11 DIAGNOSIS — I700 Atherosclerosis of aorta: Secondary | ICD-10-CM | POA: Diagnosis not present

## 2021-09-11 DIAGNOSIS — I872 Venous insufficiency (chronic) (peripheral): Secondary | ICD-10-CM | POA: Diagnosis not present

## 2021-09-11 DIAGNOSIS — R600 Localized edema: Secondary | ICD-10-CM | POA: Diagnosis not present

## 2021-09-11 DIAGNOSIS — E876 Hypokalemia: Secondary | ICD-10-CM | POA: Diagnosis not present

## 2021-09-11 MED ORDER — FUROSEMIDE 20 MG PO TABS: ORAL_TABLET | ORAL | 3 refills | Status: DC

## 2021-09-11 NOTE — Patient Instructions (Signed)
A few things to remember from today's visit:  Bilateral lower extremity edema - Plan: CBC, Comprehensive metabolic panel, Urinalysis, Routine w reflex microscopic  Venous insufficiency of both lower extremities  Hypokalemia  Furosemide 20 mg in the morning and at noon for 5-7 days then 1 tab daily. Lower extremity elevation above heart level and compression stockings.  If you need refills please call your pharmacy. Do not use My Chart to request refills or for acute issues that need immediate attention.   Edema  Edema is when you have too much fluid in your body or under your skin. Edema may make your legs, feet, and ankles swell. Swelling often happens in looser tissues, such as around your eyes. This is a common condition. It gets more common as you get older. There are many possible causes of edema. These include: Eating too much salt (sodium). Being on your feet or sitting for a long time. Certain medical conditions, such as: Pregnancy. Heart failure. Liver disease. Kidney disease. Cancer. Hot weather may make edema worse. Edema is usually painless. Your skin may look swollen or shiny. Follow these instructions at home: Medicines Take over-the-counter and prescription medicines only as told by your doctor. Your doctor may prescribe a medicine to help your body get rid of extra water (diuretic). Take this medicine if you are told to take it. Eating and drinking Eat a low-salt (low-sodium) diet as told by your doctor. Sometimes, eating less salt may reduce swelling. Depending on the cause of your swelling, you may need to limit how much fluid you drink (fluid restriction). General instructions Raise the injured area above the level of your heart while you are sitting or lying down. Do not sit still or stand for a long time. Do not wear tight clothes. Do not wear garters on your upper legs. Exercise your legs. This can help the swelling go down. Wear compression stockings as  told by your doctor. It is important that these are the right size. These should be prescribed by your doctor to prevent possible injuries. If elastic bandages or wraps are recommended, use them as told by your doctor. Contact a doctor if: Treatment is not working. You have heart, liver, or kidney disease and have symptoms of edema. You have sudden and unexplained weight gain. Get help right away if: You have shortness of breath or chest pain. You cannot breathe when you lie down. You have pain, redness, or warmth in the swollen areas. You have heart, liver, or kidney disease and get edema all of a sudden. You have a fever and your symptoms get worse all of a sudden. These symptoms may be an emergency. Get help right away. Call 911. Do not wait to see if the symptoms will go away. Do not drive yourself to the hospital. Summary Edema is when you have too much fluid in your body or under your skin. Edema may make your legs, feet, and ankles swell. Swelling often happens in looser tissues, such as around your eyes. Raise the injured area above the level of your heart while you are sitting or lying down. Follow your doctor's instructions about diet and how much fluid you can drink. This information is not intended to replace advice given to you by your health care provider. Make sure you discuss any questions you have with your health care provider. Document Revised: 12/11/2020 Document Reviewed: 12/11/2020 Elsevier Patient Education  [place].  Please be sure medication list is accurate. If a new  problem present, please set up appointment sooner than planned today.

## 2021-09-11 NOTE — Progress Notes (Signed)
ACUTE VISIT Chief Complaint  Patient presents with   Leg Swelling    Both legs, has gone down. Seems to have swelling all over at times.    HPI: [name] is a 76 y.o. female with hx of HTN,fibromyalgia, breast cancer s/p mastectomy,chemo,and radiation;insomnia,DDD, bladder cancer, and depression here today complaining of worsening bilateral lower extremity edema. She has had problem for years. She is currently on torsemide, which she does not take regularly because it makes her "sick."  Negative for CP, palpitations,dyspnea, orthopnea, PND, foamy urine, gross hematuria, or decreased urine output. In the morning edema seems to be a "little" better.  She is also concerned about swelling around thighs, abdomen, and upper extremities. Negative for lower extremity pain or erythema. She has not started new medications.  Hypertension on losartan 100 mg daily. Hypokalemia on K-Lor 20 mK daily. Ca++ mildly elevated at 11.0 in 03/2021. Ica mildly elevated at 1.43. It has been elevated intermittently in the past few months.  Next appt with urologist in 10/2021. She had some urinary frequency last week, no other associated urinary symptom and resolved.  She has not noted abdominal pain, nausea, vomiting, changes in bowel habits, skin pruritus, or jaundice. Lab Results  Component Value Date   CREATININE 0.70 07/31/2021   BUN 9 07/31/2021   NA 144 07/31/2021   K 4.7 07/31/2021   CL 106 07/31/2021   CO2 27 04/02/2021   Abdominal atherosclerosis: Seen on abdominal CT in 10/2017. She is on Crestor 10 mg daily.  Lab Results  Component Value Date   CHOL 321 (H) 04/02/2021   HDL 66.50 04/02/2021   LDLCALC 219 (H) 04/02/2021   LDLDIRECT 209.0 01/11/2019   TRIG 178.0 (H) 04/02/2021   CHOLHDL 5 04/02/2021   Review of Systems  Constitutional:  Negative for activity change, appetite change and fever.  HENT:  Negative for mouth sores, nosebleeds and sore throat.   Eyes:   Negative for redness and visual disturbance.  Respiratory:  Negative for cough and wheezing.   Neurological:  Negative for syncope, weakness, numbness and headaches.  Rest see pertinent positives and negatives per HPI.  Current Outpatient Medications on File Prior to Visit  Medication Sig Dispense Refill   acetaminophen (TYLENOL) 325 MG tablet Take 650 mg by mouth every 6 (six) hours as needed.     BIOTIN PO Take 1 tablet by mouth daily.     CALCIUM PO Take by mouth.     cetirizine (ZYRTEC) 10 MG tablet Take 10 mg by mouth daily.     Cholecalciferol (VITAMIN D3 PO) Take by mouth.     cyanocobalamin 1000 MCG tablet Take 1,000 mcg by mouth daily. Vitamin b 12     ibuprofen (ADVIL) 200 MG tablet Take 200 mg by mouth every 6 (six) hours as needed.     losartan (COZAAR) 100 MG tablet TAKE 1 TABLET BY MOUTH DAILY 90 tablet 1   Magnesium 400 MG TABS Take 400 mg by mouth daily. 90 tablet 3   meclizine (ANTIVERT) 25 MG tablet TAKE 1 TABLET BY MOUTH 3  TIMES DAILY AS NEEDED FOR  DIZZINESS 90 tablet 1   meloxicam (MOBIC) 15 MG tablet Take 1 tablet (15 mg total) by mouth daily. (Patient taking differently: Take 15 mg by mouth 2 (two) times daily.) 30 tablet 0   Menthol, Topical Analgesic, (BIOFREEZE ROLL-ON EX) Apply topically. 2 or 3 x daily     pantoprazole (PROTONIX) 40 MG tablet TAKE 1 TABLET(40  MG) BY MOUTH DAILY 90 tablet 1   potassium chloride SA (KLOR-CON M) 20 MEQ tablet TAKE 1 TABLET BY MOUTH  DAILY (Patient taking differently: 3 (three) times daily. TAKE 1 TABLET BY MOUTH  DAILY) 90 tablet 3   Probiotic Product (ALIGN PO) Take 1 tablet by mouth daily.      rosuvastatin (CRESTOR) 10 MG tablet Take 10 mg by mouth. 2 to 3 x week in am     traMADol (ULTRAM) 50 MG tablet Take 1 tablet (50 mg total) by mouth every 6 (six) hours as needed. 15 tablet 0   traMADol (ULTRAM) 50 MG tablet Take 1 tablet (50 mg total) by mouth every 6 (six) hours as needed. 20 tablet 0   UNABLE TO FIND Flucinonide usp 0.05  % cream prn for eczema     No current facility-administered medications on file prior to visit.   Past Medical History:  Diagnosis Date   Bilateral lower extremity edema    per pt left ankle greater than right weras left leg compression hose prn   Bladder cancer [facility])    Bladder tumor    CAD (coronary artery disease)    cardiac cath 01/ 2005 by dr Terrence Dupont,  non-obstructive cad;  nuclear study 07-31-2016  normal with no evidence ischemia,nuclear ef 52%   Chest pain due to GERD    pt states had chest pain 07-17-2021 due to eating yams none since per pt on 34-05-2021   Diverticulosis of colon    Eczema    Fibromyalgia    GERD (gastroesophageal reflux disease)    Hemorrhoids    Hiatal hernia    History of cancer chemotherapy 2000   left breast cancer   History of COVID-19 2022   mild all symptoms resolved   History of diverticulitis of colon    History of gastritis    History of kidney stones    History of left breast cancer 2000   dx 2000 and s/p left mastectomy w/ node dissection;  completed chemo and radiation therapy same year 2000 and completed anti-estrogen 2005;  per pt released from oncologist 2006 and no recurrence   Hyperlipidemia    Hypertension    followed by pcp   Personal history of radiation therapy 2000   left breast cancer   Wears denture upper    Wears glasses    Allergies  Allergen Reactions   Hydrochlorothiazide Other (See Comments)    High Ca++   Flomax [Tamsulosin] Itching   Codeine Itching   Statins Other (See Comments)    Muscle aches   Zetia [Ezetimibe] Other (See Comments)    Muscle aches   Social History   Socioeconomic History   Marital status: Widowed    Spouse name: Not on file   Number of children: Not on file   Years of education: Not on file   Highest education level: Not on file  Occupational History   Not on file  Tobacco Use   Smoking status: Former    Packs/day: 1.00    Years: 15.00    Pack years: 15.00    Types: Cigarettes     Quit date: 09/20/1998    Years since quitting: 23.0   Smokeless tobacco: Never  Vaping Use   Vaping Use: Never used  Substance and Sexual Activity   Alcohol use: Not Currently   Drug use: Never   Sexual activity: Not Currently    Birth control/protection: Surgical    Comment: 1st intercourse 76 yo-Fewer than  5 partners, hysterectomy  Other Topics Concern   Not on file  Social History Narrative   Widowed- husband died of Alzheimers in 2013-02-17.   Social Determinants of Health   Financial Resource Strain: Medium Risk   Difficulty of Paying Living Expenses: Somewhat hard  Food Insecurity: No Food Insecurity   Worried About Charity fundraiser in the Last Year: Never true   Ran Out of Food in the Last Year: Never true  Transportation Needs: No Transportation Needs   Lack of Transportation (Medical): No   Lack of Transportation (Non-Medical): No  Physical Activity: Insufficiently Active   Days of Exercise per Week: 3 days   Minutes of Exercise per Session: 20 min  Stress: No Stress Concern Present   Feeling of Stress : Not at all  Social Connections: Moderately Isolated   Frequency of Communication with Friends and Family: Twice a week   Frequency of Social Gatherings with Friends and Family: Twice a week   Attends Religious Services: More than 4 times per year   Active Member of [employer] or Organizations: No   Attends Archivist Meetings: Never   Marital Status: Widowed   Vitals:   09/11/21 1513  BP: 128/80  Pulse: 79  Resp: 16  Temp: 98.9 F (37.2 C)  SpO2: 98%  Body mass index is 31.5 kg/m.  Physical Exam Vitals and nursing note reviewed.  Constitutional:      General: She is not in acute distress.    Appearance: She is well-developed.  HENT:     Head: Normocephalic and atraumatic.     Mouth/Throat:     Mouth: Mucous membranes are moist.     Pharynx: Oropharynx is clear.  Eyes:     Conjunctiva/sclera: Conjunctivae normal.  Cardiovascular:     Rate  and Rhythm: Normal rate and regular rhythm.     Pulses:          Dorsalis pedis pulses are 2+ on the right side and 2+ on the left side.     Heart sounds: No murmur heard.    Comments: 2+ non pitting and trace pitting LE edema R>L.  I do not appreciate edema of thighs, abdominal wall, or upper extremities. Pulmonary:     Effort: Pulmonary effort is normal. No respiratory distress.     Breath sounds: Normal breath sounds.  Abdominal:     Palpations: Abdomen is soft. There is no fluid wave, hepatomegaly or mass.     Tenderness: There is no abdominal tenderness.  Lymphadenopathy:     Cervical: No cervical adenopathy.  Skin:    General: Skin is warm.     Findings: No erythema or rash.  Neurological:     General: No focal deficit present.     Mental Status: She is alert and oriented to person, place, and time.     Comments: Stable gait, not assisted.  Psychiatric:     Comments: Well groomed, good eye contact.   ASSESSMENT AND PLAN:  [name] was seen today for leg swelling.  Diagnoses and all orders for this visit: Orders Placed This Encounter  Procedures   CBC   Comprehensive metabolic panel   Urinalysis, Routine w reflex microscopic   Lab Results  Component Value Date   WBC 4.8 09/11/2021   HGB 12.9 09/11/2021   HCT 41.0 09/11/2021   MCV 82.4 09/11/2021   PLT 231.0 09/11/2021   Lab Results  Component Value Date   CREATININE 0.79 09/11/2021   BUN 11 09/11/2021  NA 143 09/11/2021   K 4.1 09/11/2021   CL 107 09/11/2021   CO2 26 09/11/2021   Lab Results  Component Value Date   ALT 7 09/11/2021   AST 13 09/11/2021   ALKPHOS 93 09/11/2021   BILITOT 0.3 09/11/2021   Bilateral lower extremity edema Problem seems to be chronic. Hx and examination do not suggest a serious process. She does not want to take Torsemide due to side effects, would like to go back to Furosemide. We discussed some side effects of diuretics. Furosemide 20 mg bid x 5-7 days then  qd.  Venous insufficiency of both lower extremities Could be a contributing factor for LE edema. LE elevation above heart level a few times per day and compression stocking will help.  Hypokalemia Continue KLOR 20 meq daily. Further recommendations according to lab results.  Abdominal aortic atherosclerosis ([place]) LDL is not at goal. We will plan on lipid panel next visit in 09/2021. Continue Crestor 10 mg daily.  Hypercalcemia Mild. We will continue following regularly.  I spent a total of 41 minutes in both face to face and non face to face activities for this visit on the date of this encounter. During this time history was obtained and documented, examination was performed, prior labs/imaging reviewed, and assessment/plan discussed.  Return in about 4 weeks (around 10/09/2021).  Cyndia Degraff G. [place], MD  [facility]. [place] office.

## 2021-09-12 LAB — CBC
HCT: 41 % (ref 36.0–46.0)
Hemoglobin: 12.9 g/dL (ref 12.0–15.0)
MCHC: 31.4 g/dL (ref 30.0–36.0)
MCV: 82.4 fl (ref 78.0–100.0)
Platelets: 231 10*3/uL (ref 150.0–400.0)
RBC: 4.98 Mil/uL (ref 3.87–5.11)
RDW: 15 % (ref 11.5–15.5)
WBC: 4.8 10*3/uL (ref 4.0–10.5)

## 2021-09-12 LAB — URINALYSIS, ROUTINE W REFLEX MICROSCOPIC
Bilirubin Urine: NEGATIVE
Hgb urine dipstick: NEGATIVE
Leukocytes,Ua: NEGATIVE
Nitrite: NEGATIVE
RBC / HPF: NONE SEEN (ref 0–?)
Specific Gravity, Urine: >=1.03 — AB (ref 1.000–1.030)
Total Protein, Urine: NEGATIVE
Urine Glucose: NEGATIVE
Urobilinogen, UA: 0.2 (ref 0.0–1.0)
pH: 6 (ref 5.0–8.0)

## 2021-09-12 LAB — COMPREHENSIVE METABOLIC PANEL
ALT: 7 U/L (ref 0–35)
AST: 13 U/L (ref 0–37)
Albumin: 4.5 g/dL (ref 3.5–5.2)
Alkaline Phosphatase: 93 U/L (ref 39–117)
BUN: 11 mg/dL (ref 6–23)
CO2: 26 mEq/L (ref 19–32)
Calcium: 10.6 mg/dL — ABNORMAL HIGH (ref 8.4–10.5)
Chloride: 107 mEq/L (ref 96–112)
Creatinine, Ser: 0.79 mg/dL (ref 0.40–1.20)
GFR: 72.69 mL/min (ref >60.00)
Glucose, Bld: 84 mg/dL (ref 70–99)
Potassium: 4.1 mEq/L (ref 3.5–5.1)
Sodium: 143 mEq/L (ref 135–145)
Total Bilirubin: 0.3 mg/dL (ref 0.2–1.2)
Total Protein: 7 g/dL (ref 6.0–8.3)

## 2021-09-15 ENCOUNTER — Encounter: Payer: Self-pay | Admitting: Family Medicine

## 2021-10-08 ENCOUNTER — Ambulatory Visit (INDEPENDENT_AMBULATORY_CARE_PROVIDER_SITE_OTHER): Payer: Medicare Other

## 2021-10-08 VITALS — Ht 67.5 in | Wt 202.0 lb

## 2021-10-08 DIAGNOSIS — Z0000 Encounter for general adult medical examination without abnormal findings: Secondary | ICD-10-CM

## 2021-10-08 NOTE — Progress Notes (Signed)
I connected with [name] today by telephone and verified that I am speaking with the correct person using two identifiers. Location patient: home Location provider: work Persons participating in the virtual visit: [name], Brooke Durand LPN.   I discussed the limitations, risks, security and privacy concerns of performing an evaluation and management service by telephone and the availability of in person appointments. I also discussed with the patient that there may be a patient responsible charge related to this service. The patient expressed understanding and verbally consented to this telephonic visit.    Interactive audio and video telecommunications were attempted between this provider and patient, however failed, due to patient having technical difficulties OR patient did not have access to video capability.  We continued and completed visit with audio only.     Vital signs may be patient reported or missing.  Subjective:   [name] is a 76 y.o. female who presents for Medicare Annual (Subsequent) preventive examination.  Review of Systems     Cardiac Risk Factors include: advanced age (>17men, >42 women);dyslipidemia;hypertension;obesity (BMI >30kg/m2)     Objective:    Today's Vitals   10/08/21 1011 10/08/21 1012  Weight: 202 lb (91.6 kg)   Height: 5' 7.5" (1.715 m)   PainSc:  4    Body mass index is 31.17 kg/m.     10/08/2021   10:18 AM 07/31/2021    8:47 AM 01/09/2021    6:08 AM 10/03/2020    1:11 PM 08/29/2020   11:13 AM 03/21/2020    9:42 AM 07/01/2018   10:05 AM  Advanced Directives  Does Patient Have a Medical Advance Directive? No No No Yes No No No  Type of Scientist, research (medical);Living will     Does patient want to make changes to medical advance directive?       Yes (MAU/Ambulatory/Procedural Areas - Information given)  Copy of [place] in Chart?    No - copy requested     Would  patient like information on creating a medical advance directive?  Yes (MAU/Ambulatory/Procedural Areas - Information given) Yes (MAU/Ambulatory/Procedural Areas - Information given)  No - Patient declined Yes (MAU/Ambulatory/Procedural Areas - Information given)     Current Medications (verified) Outpatient Encounter Medications as of 10/08/2021  Medication Sig   acetaminophen (TYLENOL) 325 MG tablet Take 650 mg by mouth every 6 (six) hours as needed.   BIOTIN PO Take 1 tablet by mouth daily.   CALCIUM PO Take by mouth.   cetirizine (ZYRTEC) 10 MG tablet Take 10 mg by mouth daily.   Cholecalciferol (VITAMIN D3 PO) Take by mouth.   cyanocobalamin 1000 MCG tablet Take 1,000 mcg by mouth daily. Vitamin b 12   furosemide (LASIX) 20 MG tablet 1 tab 2 times daily for 5-7 days then once daily.   ibuprofen (ADVIL) 200 MG tablet Take 200 mg by mouth every 6 (six) hours as needed.   losartan (COZAAR) 100 MG tablet TAKE 1 TABLET BY MOUTH DAILY   meclizine (ANTIVERT) 25 MG tablet TAKE 1 TABLET BY MOUTH 3  TIMES DAILY AS NEEDED FOR  DIZZINESS   meloxicam (MOBIC) 15 MG tablet Take 1 tablet (15 mg total) by mouth daily. (Patient taking differently: Take 15 mg by mouth 2 (two) times daily.)   Menthol, Topical Analgesic, (BIOFREEZE ROLL-ON EX) Apply topically. 2 or 3 x daily   pantoprazole (PROTONIX) 40 MG tablet TAKE 1 TABLET(40 MG) BY MOUTH DAILY  potassium chloride SA (KLOR-CON M) 20 MEQ tablet TAKE 1 TABLET BY MOUTH  DAILY (Patient taking differently: 3 (three) times daily. TAKE 1 TABLET BY MOUTH  DAILY)   Probiotic Product (ALIGN PO) Take 1 tablet by mouth daily.    rosuvastatin (CRESTOR) 10 MG tablet Take 10 mg by mouth. 2 to 3 x week in am   traMADol (ULTRAM) 50 MG tablet Take 1 tablet (50 mg total) by mouth every 6 (six) hours as needed.   traMADol (ULTRAM) 50 MG tablet Take 1 tablet (50 mg total) by mouth every 6 (six) hours as needed.   UNABLE TO FIND Flucinonide usp 0.05 % cream prn for eczema    Magnesium 400 MG TABS Take 400 mg by mouth daily. (Patient not taking: Reported on 10/08/2021)   No facility-administered encounter medications on file as of 10/08/2021.    Allergies (verified) Hydrochlorothiazide, Flomax [tamsulosin], Codeine, Statins, and Zetia [ezetimibe]   History: Past Medical History:  Diagnosis Date   Bilateral lower extremity edema    per pt left ankle greater than right weras left leg compression hose prn   Bladder cancer (HCC)    Bladder tumor    CAD (coronary artery disease)    cardiac cath 01/ 2005 by dr Terrence Dupont,  non-obstructive cad;  nuclear study 07-31-2016  normal with no evidence ischemia,nuclear ef 52%   Chest pain due to GERD    pt states had chest pain 07-17-2021 due to eating yams none since per pt on 34-05-2021   Diverticulosis of colon    Eczema    Fibromyalgia    GERD (gastroesophageal reflux disease)    Hemorrhoids    Hiatal hernia    History of cancer chemotherapy 2000   left breast cancer   History of COVID-19 2022   mild all symptoms resolved   History of diverticulitis of colon    History of gastritis    History of kidney stones    History of left breast cancer 2000   dx 2000 and s/p left mastectomy w/ node dissection;  completed chemo and radiation therapy same year 2000 and completed anti-estrogen 2005;  per pt released from oncologist 2006 and no recurrence   Hyperlipidemia    Hypertension    followed by pcp   Personal history of radiation therapy 2000   left breast cancer   Wears denture upper    Wears glasses    Past Surgical History:  Procedure Laterality Date   ABDOMINAL HYSTERECTOMY  1978   AND LYSIS ADHESIONS /  APPENDECTOMY  (still has ovaries)   ANTERIOR AND POSTERIOR REPAIR WITH SACROSPINOUS FIXATION  07-11-2008  @[facility]    AND ENTEROCELE REPAIR/ UTEROSACRAL COLPOSUSPENSION   BREAST REDUCTION SURGERY Right 2000   CARDIAC CATHETERIZATION  05-10-2003   dr Terrence Dupont   mild non-obstructive cad,  ef 55-60%   CATARACT  EXTRACTION W/ INTRAOCULAR LENS  IMPLANT, BILATERAL  2015   colonscopy  02/06/2018   CYST EXCISION  02-28-2020  @[facility]    right axilla   CYSTOSCOPY W/ RETROGRADES Bilateral 03/21/2020   Procedure: CYSTOSCOPY WITH RETROGRADE PYELOGRAM;  Surgeon: Remi Haggard, MD;  Location: [facility];  Service: Urology;  Laterality: Bilateral;   CYSTOSCOPY W/ RETROGRADES Bilateral 01/09/2021   Procedure: CYSTOSCOPY WITH RETROGRADE PYELOGRAM;  Surgeon: Remi Haggard, MD;  Location: [facility];  Service: Urology;  Laterality: Bilateral;   CYSTOSCOPY W/ RETROGRADES Bilateral 07/31/2021   Procedure: CYSTOSCOPY WITH RETROGRADE PYELOGRAM;  Surgeon: Remi Haggard, MD;  Location:  [place];  Service: Urology;  Laterality: Bilateral;   INGUINAL HERNIA REPAIR  09/27/2011   Procedure: HERNIA REPAIR INGUINAL ADULT;  Surgeon: Gwenyth Ober, MD;  Location: [place];  Service: General;  Laterality: Right;   KNEE ARTHROSCOPY Left 1986   MASTECTOMY WITH AXILLARY LYMPH NODE DISSECTION Left 2000   W/  RECONSTRUCTION   NASAL SINUS SURGERY  1980s   ROTATOR CUFF REPAIR Right 1985   TONSILLECTOMY     child and adenoids removed   TRANSURETHRAL RESECTION OF BLADDER TUMOR N/A 03/21/2020   Procedure: TRANSURETHRAL RESECTION OF BLADDER TUMOR (TURBT);  Surgeon: Remi Haggard, MD;  Location: [facility];  Service: Urology;  Laterality: N/A;  [place] TUMOR N/A 01/09/2021   Procedure: TRANSURETHRAL RESECTION OF BLADDER TUMOR (TURBT);  Surgeon: Remi Haggard, MD;  Location: [facility];  Service: Urology;  Laterality: N/A;   TRANSURETHRAL RESECTION OF BLADDER TUMOR WITH MITOMYCIN-C N/A 07/31/2021   Procedure: TRANSURETHRAL RESECTION OF BLADDER TUMOR WITH GEMCITABINE;  Surgeon: Remi Haggard, MD;  Location: [facility];  Service: Urology;  Laterality: N/A;   Family History  Problem Relation Age  of Onset   Alzheimer's disease Mother    Breast cancer Sister 26   Colon polyps Sister    Cancer Son        prostate   Anesthesia problems Neg Hx    Social History   Socioeconomic History   Marital status: Widowed    Spouse name: Not on file   Number of children: Not on file   Years of education: Not on file   Highest education level: Not on file  Occupational History   Not on file  Tobacco Use   Smoking status: Former    Packs/day: 1.00    Years: 15.00    Total pack years: 15.00    Types: Cigarettes    Quit date: 09/20/1998    Years since quitting: 23.0   Smokeless tobacco: Never  Vaping Use   Vaping Use: Never used  Substance and Sexual Activity   Alcohol use: Not Currently   Drug use: Never   Sexual activity: Not Currently    Birth control/protection: Surgical    Comment: 1st intercourse 76 yo-Fewer than 5 partners, hysterectomy  Other Topics Concern   Not on file  Social History Narrative   Widowed- husband died of Alzheimers in February 05, 2013.   Social Determinants of Health   Financial Resource Strain: Low Risk  (10/08/2021)   Overall Financial Resource Strain (CARDIA)    Difficulty of Paying Living Expenses: Not hard at all  Food Insecurity: No Food Insecurity (10/08/2021)   Hunger Vital Sign    Worried About Running Out of Food in the Last Year: Never true    Ran Out of Food in the Last Year: Never true  Transportation Needs: No Transportation Needs (10/08/2021)   PRAPARE - Hydrologist (Medical): No    Lack of Transportation (Non-Medical): No  Physical Activity: Inactive (10/08/2021)   Exercise Vital Sign    Days of Exercise per Week: 0 days    Minutes of Exercise per Session: 0 min  Stress: No Stress Concern Present (10/08/2021)   [place]    Feeling of Stress : Not at all  Social Connections: Moderately Isolated (10/03/2020)   Social Connection and Isolation Panel  [NHANES]    Frequency of  Communication with Friends and Family: Twice a week    Frequency of Social Gatherings with Friends and Family: Twice a week    Attends Religious Services: More than 4 times per year    Active Member of [employer] or Organizations: No    Attends Archivist Meetings: Never    Marital Status: Widowed    Tobacco Counseling Counseling given: Not Answered   Clinical Intake:  Pre-visit preparation completed: Yes  Pain : 0-10 Pain Score: 4  Pain Type: Chronic pain Pain Location: Rib cage Pain Orientation: Right Pain Descriptors / Indicators: Sharp, Sore Pain Onset: More than a month ago Pain Frequency: Intermittent     Nutritional Status: BMI > 30  Obese Nutritional Risks: None Diabetes: No  How often do you need to have someone help you when you read instructions, pamphlets, or other written materials from your doctor or pharmacy?: 1 - Never What is the last grade level you completed in school?: 12th grade  Diabetic? no  Interpreter Needed?: No  Information entered by :: NAllen LPN   Activities of Daily Living    10/08/2021   10:18 AM 07/31/2021    8:53 AM  In your present state of health, do you have any difficulty performing the following activities:  Hearing? 0 0  Vision? 0 0  Difficulty concentrating or making decisions? 0 0  Walking or climbing stairs? 0 0  Dressing or bathing? 0 0  Doing errands, shopping? 0   Preparing Food and eating ? N   Using the Toilet? N   In the past six months, have you accidently leaked urine? Y   Do you have problems with loss of bowel control? N   Managing your Medications? N   Managing your Finances? N   Housekeeping or managing your Housekeeping? N     Patient Care Team: [place], Betty G, MD as PCP - General (Family Medicine) Croitoru, Dani Gobble, MD as PCP - Cardiology (Cardiology) Fontaine, Belinda Block, MD (Inactive) as Consulting Physician (Gynecology) Magrinat, Virgie Dad, MD (Inactive) as  Consulting Physician (Oncology) Juanita Craver, MD as Consulting Physician (Gastroenterology) Charolette Forward, MD as Consulting Physician (Cardiology)  Indicate any recent Medical Services you may have received from other than [facility] providers in the past year (date may be approximate).     Assessment:   This is a routine wellness examination for [name].  Hearing/Vision screen Vision Screening - Comments:: Regular eye exams, Dr. Katy Fitch  Dietary issues and exercise activities discussed: Current Exercise Habits: The patient does not participate in regular exercise at present   Goals Addressed             This Visit's Progress    Patient Stated       10/08/2021, wants to lose weight and stop swelling       Depression Screen    10/08/2021   10:18 AM 09/11/2021    3:33 PM 04/05/2021    7:07 PM 10/03/2020    1:12 PM 10/03/2020    1:09 PM 10/11/2019   11:04 AM 07/01/2018   10:10 AM  PHQ 2/9 Scores  PHQ - 2 Score 0 0 0 0 0 0 0  PHQ- 9 Score   0        Fall Risk    10/08/2021   10:18 AM 09/11/2021    3:33 PM 04/02/2021    7:07 PM 10/03/2020    1:12 PM 03/08/2020    2:12 PM  Fall Risk   Falls in the past  year? 0 0 0 1 0  Number falls in past yr: 0 0 0 0 0  Injury with Fall? 0 0 0 0 0  Risk for fall due to : Medication side effect No Fall Risks  Impaired balance/gait No Fall Risks  Follow up Falls evaluation completed;Education provided;Falls prevention discussed Falls evaluation completed Education provided Falls evaluation completed Falls evaluation completed    FALL RISK PREVENTION PERTAINING TO THE HOME:  Any stairs in or around the home? Yes  If so, are there any without handrails? No  Home free of loose throw rugs in walkways, pet beds, electrical cords, etc? Yes  Adequate lighting in your home to reduce risk of falls? Yes   ASSISTIVE DEVICES UTILIZED TO PREVENT FALLS:  Life alert? No  Use of a cane, walker or w/c? No  Grab bars in the bathroom? Yes  Shower chair  or bench in shower? No  Elevated toilet seat or a handicapped toilet? No   TIMED UP AND GO:  Was the test performed? No .      Cognitive Function:    01/02/2017    9:26 AM  MMSE - Mini Mental State Exam  Orientation to time 5  Orientation to Place 5  Registration 3  Attention/ Calculation 0  Recall 2  Recall-comments pt was unable to recall 1 of 3 words  Language- name 2 objects 0  Language- repeat 1  Language- follow 3 step command 3  Language- read & follow direction 0  Write a sentence 0  Copy design 0  Total score 19        10/08/2021   10:20 AM  6CIT Screen  What Year? 0 points  What month? 0 points  What time? 0 points  Count back from 20 0 points  Months in reverse 0 points  Repeat phrase 2 points  Total Score 2 points    Immunizations Immunization History  Administered Date(s) Administered   Fluad Quad(high Dose 65+) 01/21/2020   Influenza, High Dose Seasonal PF 02/11/2017, 01/19/2018, 12/17/2018   Influenza,inj,Quad PF,6+ Mos 02/23/2013, 02/02/2015, 01/26/2016   Influenza-Unspecified 01/27/2021   PFIZER(Purple Top)SARS-COV-2 Vaccination 05/20/2019, 06/18/2019, 01/20/2020, 01/27/2021   Pneumococcal Conjugate-13 02/21/2015   Pneumococcal Polysaccharide-23 07/17/2017    TDAP status: Due, Education has been provided regarding the importance of this vaccine. Advised may receive this vaccine at local pharmacy or Health Dept. Aware to provide a copy of the vaccination record if obtained from local pharmacy or Health Dept. Verbalized acceptance and understanding.  Flu Vaccine status: Up to date  Pneumococcal vaccine status: Up to date  Covid-19 vaccine status: Completed vaccines  Qualifies for Shingles Vaccine? Yes   Zostavax completed No   Shingrix Completed?: No.    Education has been provided regarding the importance of this vaccine. Patient has been advised to call insurance company to determine out of pocket expense if they have not yet received  this vaccine. Advised may also receive vaccine at local pharmacy or Health Dept. Verbalized acceptance and understanding.  Screening Tests Health Maintenance  Topic Date Due   Zoster Vaccines- Shingrix (1 of 2) Never done   COVID-19 Vaccine (5 - Booster for Pfizer series) 03/24/2021   TETANUS/TDAP  01/03/2027 (Originally 05/02/1964)   INFLUENZA VACCINE  11/20/2021   COLONOSCOPY (Pts 45-31yrs Insurance coverage will need to be confirmed)  02/17/2023   Pneumonia Vaccine 9+ Years old  Completed   DEXA SCAN  Completed   Hepatitis C Screening  Completed   HPV VACCINES  Aged Out    Health Maintenance  Health Maintenance Due  Topic Date Due   Zoster Vaccines- Shingrix (1 of 2) Never done   COVID-19 Vaccine (5 - Booster for Pfizer series) 03/24/2021    Colorectal cancer screening: Type of screening: Colonoscopy. Completed 02/16/2018. Repeat every 5 years  Mammogram status: Completed 02/28/2021. Repeat every year  Bone Density status: Completed 05/07/2018.   Lung Cancer Screening: (Low Dose CT Chest recommended if Age 56-80 years, 30 pack-year currently smoking OR have quit w/in 15years.) does not qualify.   Lung Cancer Screening Referral: no  Additional Screening:  Hepatitis C Screening: does qualify; Completed 02/21/2015  Vision Screening: Recommended annual ophthalmology exams for early detection of glaucoma and other disorders of the eye. Is the patient up to date with their annual eye exam?  Yes  Who is the provider or what is the name of the office in which the patient attends annual eye exams? Dr. Katy Fitch  If pt is not established with a provider, would they like to be referred to a provider to establish care? No .   Dental Screening: Recommended annual dental exams for proper oral hygiene  Community Resource Referral / Chronic Care Management: CRR required this visit?  No   CCM required this visit?  No      Plan:     I have personally reviewed and noted the following  in the patient's chart:   Medical and social history Use of alcohol, tobacco or illicit drugs  Current medications and supplements including opioid prescriptions.  Functional ability and status Nutritional status Physical activity Advanced directives List of other physicians Hospitalizations, surgeries, and ER visits in previous 12 months Vitals Screenings to include cognitive, depression, and falls Referrals and appointments  In addition, I have reviewed and discussed with patient certain preventive protocols, quality metrics, and best practice recommendations. A written personalized care plan for preventive services as well as general preventive health recommendations were provided to patient.     Kellie Simmering, LPN   [id number]   Nurse Notes: none  Due to this being a virtual visit, the after visit summary with patients personalized plan was offered to patient via mail or my-chart. Patient would like to access on my-chart

## 2021-10-08 NOTE — Progress Notes (Unsigned)
HPI:  [name] is a 76 y.o. female, who is here today to follow on recent visit. She was last seen on 09/11/21 for LE edema. Torsemide was changed to Furosemide 20 mg daily. Lab Results  Component Value Date   CREATININE 0.79 09/11/2021   BUN 11 09/11/2021   NA 143 09/11/2021   K 4.1 09/11/2021   CL 107 09/11/2021   CO2 26 09/11/2021   Lab Results  Component Value Date   PTH 67 (H) 11/25/2019   CALCIUM 10.6 (H) 09/11/2021   CAION 1.43 (H) 07/31/2021   Right rib cage pain, exacerbated by lying on right side , started about 2 weeks ago. Deep breathing and movement aggravates pain. No nausea or vomiting. No hx of trauma.  Lab Results  Component Value Date   ALT 7 09/11/2021   AST 13 09/11/2021   ALKPHOS 93 09/11/2021   BILITOT 0.3 09/11/2021    Review of Systems Rest see pertinent positives and negatives per HPI.  Current Outpatient Medications on File Prior to Visit  Medication Sig Dispense Refill   acetaminophen (TYLENOL) 325 MG tablet Take 650 mg by mouth every 6 (six) hours as needed.     BIOTIN PO Take 1 tablet by mouth daily.     CALCIUM PO Take by mouth.     cetirizine (ZYRTEC) 10 MG tablet Take 10 mg by mouth daily.     Cholecalciferol (VITAMIN D3 PO) Take by mouth.     cyanocobalamin 1000 MCG tablet Take 1,000 mcg by mouth daily. Vitamin b 12     furosemide (LASIX) 20 MG tablet 1 tab 2 times daily for 5-7 days then once daily. 30 tablet 3   ibuprofen (ADVIL) 200 MG tablet Take 200 mg by mouth every 6 (six) hours as needed.     losartan (COZAAR) 100 MG tablet TAKE 1 TABLET BY MOUTH DAILY 90 tablet 1   Magnesium 400 MG TABS Take 400 mg by mouth daily. 90 tablet 3   meclizine (ANTIVERT) 25 MG tablet TAKE 1 TABLET BY MOUTH 3  TIMES DAILY AS NEEDED FOR  DIZZINESS 90 tablet 1   meloxicam (MOBIC) 15 MG tablet Take 1 tablet (15 mg total) by mouth daily. (Patient taking differently: Take 15 mg by mouth 2 (two) times daily.) 30 tablet 0   Menthol,  Topical Analgesic, (BIOFREEZE ROLL-ON EX) Apply topically. 2 or 3 x daily     pantoprazole (PROTONIX) 40 MG tablet TAKE 1 TABLET(40 MG) BY MOUTH DAILY 90 tablet 1   potassium chloride SA (KLOR-CON M) 20 MEQ tablet TAKE 1 TABLET BY MOUTH  DAILY (Patient taking differently: 3 (three) times daily. TAKE 1 TABLET BY MOUTH  DAILY) 90 tablet 3   Probiotic Product (ALIGN PO) Take 1 tablet by mouth daily.      traMADol (ULTRAM) 50 MG tablet Take 1 tablet (50 mg total) by mouth every 6 (six) hours as needed. 20 tablet 0   UNABLE TO FIND Flucinonide usp 0.05 % cream prn for eczema     No current facility-administered medications on file prior to visit.    Past Medical History:  Diagnosis Date   Bilateral lower extremity edema    per pt left ankle greater than right weras left leg compression hose prn   Bladder cancer [facility])    Bladder tumor    CAD (coronary artery disease)    cardiac cath 01/ 2005 by dr Terrence Dupont,  non-obstructive cad;  nuclear study 07-31-2016  normal with  no evidence ischemia,nuclear ef 52%   Chest pain due to GERD    pt states had chest pain 07-17-2021 due to eating yams none since per pt on 34-05-2021   Diverticulosis of colon    Eczema    Fibromyalgia    GERD (gastroesophageal reflux disease)    Hemorrhoids    Hiatal hernia    History of cancer chemotherapy 2000   left breast cancer   History of COVID-19 2022   mild all symptoms resolved   History of diverticulitis of colon    History of gastritis    History of kidney stones    History of left breast cancer 2000   dx 2000 and s/p left mastectomy w/ node dissection;  completed chemo and radiation therapy same year 2000 and completed anti-estrogen 2005;  per pt released from oncologist 2006 and no recurrence   Hyperlipidemia    Hypertension    followed by pcp   Personal history of radiation therapy 2000   left breast cancer   Wears denture upper    Wears glasses    Allergies  Allergen Reactions   Hydrochlorothiazide  Other (See Comments)    High Ca++   Flomax [Tamsulosin] Itching   Codeine Itching   Statins Other (See Comments)    Muscle aches   Zetia [Ezetimibe] Other (See Comments)    Muscle aches    Social History   Socioeconomic History   Marital status: Widowed    Spouse name: Not on file   Number of children: Not on file   Years of education: Not on file   Highest education level: Not on file  Occupational History   Not on file  Tobacco Use   Smoking status: Former    Packs/day: 1.00    Years: 15.00    Total pack years: 15.00    Types: Cigarettes    Quit date: 09/20/1998    Years since quitting: 23.0   Smokeless tobacco: Never  Vaping Use   Vaping Use: Never used  Substance and Sexual Activity   Alcohol use: Not Currently   Drug use: Never   Sexual activity: Not Currently    Birth control/protection: Surgical    Comment: 1st intercourse 76 yo-Fewer than 5 partners, hysterectomy  Other Topics Concern   Not on file  Social History Narrative   Widowed- husband died of Alzheimers in 02/27/13.   Social Determinants of Health   Financial Resource Strain: Low Risk  (10/08/2021)   Overall Financial Resource Strain (CARDIA)    Difficulty of Paying Living Expenses: Not hard at all  Food Insecurity: No Food Insecurity (10/08/2021)   Hunger Vital Sign    Worried About Running Out of Food in the Last Year: Never true    Ran Out of Food in the Last Year: Never true  Transportation Needs: No Transportation Needs (10/08/2021)   PRAPARE - Hydrologist (Medical): No    Lack of Transportation (Non-Medical): No  Physical Activity: Inactive (10/08/2021)   Exercise Vital Sign    Days of Exercise per Week: 0 days    Minutes of Exercise per Session: 0 min  Stress: No Stress Concern Present (10/08/2021)   [place]    Feeling of Stress : Not at all  Social Connections: Moderately Isolated (10/03/2020)    Social Connection and Isolation Panel [NHANES]    Frequency of Communication with Friends and Family: Twice a week    Frequency  of Social Gatherings with Friends and Family: Twice a week    Attends Religious Services: More than 4 times per year    Active Member of [employer] or Organizations: No    Attends Archivist Meetings: Never    Marital Status: Widowed    Vitals:   10/09/21 1405  BP: 136/76  Pulse: 95  Resp: 16  SpO2: 99%   Body mass index is 32.17 kg/m.  Physical Exam Vitals and nursing note reviewed.  Constitutional:      General: She is not in acute distress.    Appearance: She is well-developed.  HENT:     Head: Normocephalic and atraumatic.     Mouth/Throat:     Mouth: Mucous membranes are moist.     Pharynx: Oropharynx is clear.  Eyes:     Conjunctiva/sclera: Conjunctivae normal.  Cardiovascular:     Rate and Rhythm: Normal rate and regular rhythm.     Pulses:          Posterior tibial pulses are 2+ on the right side and 2+ on the left side.     Heart sounds: No murmur heard. Pulmonary:     Effort: Pulmonary effort is normal. No respiratory distress.     Breath sounds: Normal breath sounds.  Abdominal:     Palpations: Abdomen is soft. There is no hepatomegaly or mass.     Tenderness: There is no abdominal tenderness.    Musculoskeletal:     Right lower leg: Edema present.     Left lower leg: Edema present.  Lymphadenopathy:     Cervical: No cervical adenopathy.  Skin:    General: Skin is warm.     Findings: No erythema or rash.  Neurological:     General: No focal deficit present.     Mental Status: She is alert and oriented to person, place, and time.     Cranial Nerves: No cranial nerve deficit.     Gait: Gait normal.  Psychiatric:     Comments: Well groomed, good eye contact.    ASSESSMENT AND PLAN:  Ms.Levaeh was seen today for follow-up.  Diagnoses and all orders for this visit:  Abdominal aortic atherosclerosis  (HCC)  Iliac artery aneurysm, right (HCC)  Hypercalcemia -     Parathyroid hormone, intact (no Ca); Future -     VITAMIN D 25 Hydroxy (Vit-D Deficiency, Fractures); Future  Bilateral lower extremity edema -     Basic metabolic panel; Future  Other orders -     rosuvastatin (CRESTOR) 10 MG tablet; 2 to 3 x week in am    Orders Placed This Encounter  Procedures   Basic metabolic panel   Parathyroid hormone, intact (no Ca)   VITAMIN D 25 Hydroxy (Vit-D Deficiency, Fractures)    No problem-specific Assessment & Plan notes found for this encounter. Send message to Dr Kristeen Miss ***  Return in about 4 months (around 02/08/2022).   Betty G. Martinique, MD  [facility]. Center office.

## 2021-10-08 NOTE — Patient Instructions (Signed)
[name] , Thank you for taking time to come for your Medicare Wellness Visit. I appreciate your ongoing commitment to your health goals. Please review the following plan we discussed and let me know if I can assist you in the future.   Screening recommendations/referrals: Colonoscopy: completed 02/16/2018, due 02/17/2023 Mammogram: completed 02/28/2021, due 03/01/2022 Bone Density: completed 05/07/2018 Recommended yearly ophthalmology/optometry visit for glaucoma screening and checkup Recommended yearly dental visit for hygiene and checkup  Vaccinations: Influenza vaccine: due 11/20/2021 Pneumococcal vaccine: completed 07/17/2017 Tdap vaccine: due Shingles vaccine: discussed   Covid-19: 01/27/2021, 01/20/2020, 06/18/2019, 05/20/2019  Advanced directives: Advance directive discussed with you today.   Conditions/risks identified: none  Next appointment: Follow up in one year for your annual wellness visit    Preventive Care 65 Years and Older, Female Preventive care refers to lifestyle choices and visits with your health care provider that can promote health and wellness. What does preventive care include? A yearly physical exam. This is also called an annual well check. Dental exams once or twice a year. Routine eye exams. Ask your health care provider how often you should have your eyes checked. Personal lifestyle choices, including: Daily care of your teeth and gums. Regular physical activity. Eating a healthy diet. Avoiding tobacco and drug use. Limiting alcohol use. Practicing safe sex. Taking low-dose aspirin every day. Taking vitamin and mineral supplements as recommended by your health care provider. What happens during an annual well check? The services and screenings done by your health care provider during your annual well check will depend on your age, overall health, lifestyle risk factors, and family history of disease. Counseling  Your health care provider may ask  you questions about your: Alcohol use. Tobacco use. Drug use. Emotional well-being. Home and relationship well-being. Sexual activity. Eating habits. History of falls. Memory and ability to understand (cognition). Work and work Statistician. Reproductive health. Screening  You may have the following tests or measurements: Height, weight, and BMI. Blood pressure. Lipid and cholesterol levels. These may be checked every 5 years, or more frequently if you are over 76 years old. Skin check. Lung cancer screening. You may have this screening every year starting at age 76 if you have a 30-pack-year history of smoking and currently smoke or have quit within the past 15 years. Fecal occult blood test (FOBT) of the stool. You may have this test every year starting at age 76. Flexible sigmoidoscopy or colonoscopy. You may have a sigmoidoscopy every 5 years or a colonoscopy every 10 years starting at age 76. Hepatitis C blood test. Hepatitis B blood test. Sexually transmitted disease (STD) testing. Diabetes screening. This is done by checking your blood sugar (glucose) after you have not eaten for a while (fasting). You may have this done every 1-3 years. Bone density scan. This is done to screen for osteoporosis. You may have this done starting at age 76. Mammogram. This may be done every 76 years. Talk to your health care provider about how often you should have regular mammograms. Talk with your health care provider about your test results, treatment options, and if necessary, the need for more tests. Vaccines  Your health care provider may recommend certain vaccines, such as: Influenza vaccine. This is recommended every year. Tetanus, diphtheria, and acellular pertussis (Tdap, Td) vaccine. You may need a Td booster every 10 years. Zoster vaccine. You may need this after age 50. Pneumococcal 13-valent conjugate (PCV13) vaccine. One dose is recommended after age 76. Pneumococcal  polysaccharide (PPSV23)  vaccine. One dose is recommended after age 76. Talk to your health care provider about which screenings and vaccines you need and how often you need them. This information is not intended to replace advice given to you by your health care provider. Make sure you discuss any questions you have with your health care provider. Document Released: 05/05/2015 Document Revised: 12/27/2015 Document Reviewed: 02/07/2015 Elsevier Interactive Patient Education  2017 [place] Prevention in the Home Falls can cause injuries. They can happen to people of all ages. There are many things you can do to make your home safe and to help prevent falls. What can I do on the outside of my home? Regularly fix the edges of walkways and driveways and fix any cracks. Remove anything that might make you trip as you walk through a door, such as a raised step or threshold. Trim any bushes or trees on the path to your home. Use bright outdoor lighting. Clear any walking paths of anything that might make someone trip, such as rocks or tools. Regularly check to see if handrails are loose or broken. Make sure that both sides of any steps have handrails. Any raised decks and porches should have guardrails on the edges. Have any leaves, snow, or ice cleared regularly. Use sand or salt on walking paths during winter. Clean up any spills in your garage right away. This includes oil or grease spills. What can I do in the bathroom? Use night lights. Install grab bars by the toilet and in the tub and shower. Do not use towel bars as grab bars. Use non-skid mats or decals in the tub or shower. If you need to sit down in the shower, use a plastic, non-slip stool. Keep the floor dry. Clean up any water that spills on the floor as soon as it happens. Remove soap buildup in the tub or shower regularly. Attach bath mats securely with double-sided non-slip rug tape. Do not have throw rugs and other  things on the floor that can make you trip. What can I do in the bedroom? Use night lights. Make sure that you have a light by your bed that is easy to reach. Do not use any sheets or blankets that are too big for your bed. They should not hang down onto the floor. Have a firm chair that has side arms. You can use this for support while you get dressed. Do not have throw rugs and other things on the floor that can make you trip. What can I do in the kitchen? Clean up any spills right away. Avoid walking on wet floors. Keep items that you use a lot in easy-to-reach places. If you need to reach something above you, use a strong step stool that has a grab bar. Keep electrical cords out of the way. Do not use floor polish or wax that makes floors slippery. If you must use wax, use non-skid floor wax. Do not have throw rugs and other things on the floor that can make you trip. What can I do with my stairs? Do not leave any items on the stairs. Make sure that there are handrails on both sides of the stairs and use them. Fix handrails that are broken or loose. Make sure that handrails are as long as the stairways. Check any carpeting to make sure that it is firmly attached to the stairs. Fix any carpet that is loose or worn. Avoid having throw rugs at the top or bottom of  the stairs. If you do have throw rugs, attach them to the floor with carpet tape. Make sure that you have a light switch at the top of the stairs and the bottom of the stairs. If you do not have them, ask someone to add them for you. What else can I do to help prevent falls? Wear shoes that: Do not have high heels. Have rubber bottoms. Are comfortable and fit you well. Are closed at the toe. Do not wear sandals. If you use a stepladder: Make sure that it is fully opened. Do not climb a closed stepladder. Make sure that both sides of the stepladder are locked into place. Ask someone to hold it for you, if possible. Clearly  mark and make sure that you can see: Any grab bars or handrails. First and last steps. Where the edge of each step is. Use tools that help you move around (mobility aids) if they are needed. These include: Canes. Walkers. Scooters. Crutches. Turn on the lights when you go into a dark area. Replace any light bulbs as soon as they burn out. Set up your furniture so you have a clear path. Avoid moving your furniture around. If any of your floors are uneven, fix them. If there are any pets around you, be aware of where they are. Review your medicines with your doctor. Some medicines can make you feel dizzy. This can increase your chance of falling. Ask your doctor what other things that you can do to help prevent falls. This information is not intended to replace advice given to you by your health care provider. Make sure you discuss any questions you have with your health care provider. Document Released: 02/02/2009 Document Revised: 09/14/2015 Document Reviewed: 05/13/2014 Elsevier Interactive Patient Education  2017 [employer].

## 2021-10-09 ENCOUNTER — Ambulatory Visit (INDEPENDENT_AMBULATORY_CARE_PROVIDER_SITE_OTHER): Payer: Medicare Other | Admitting: Family Medicine

## 2021-10-09 ENCOUNTER — Encounter: Payer: Self-pay | Admitting: Family Medicine

## 2021-10-09 VITALS — BP 136/76 | HR 95 | Resp 16 | Ht 67.5 in | Wt 208.5 lb

## 2021-10-09 DIAGNOSIS — R600 Localized edema: Secondary | ICD-10-CM

## 2021-10-09 DIAGNOSIS — R0781 Pleurodynia: Secondary | ICD-10-CM

## 2021-10-09 DIAGNOSIS — I700 Atherosclerosis of aorta: Secondary | ICD-10-CM

## 2021-10-09 DIAGNOSIS — I723 Aneurysm of iliac artery: Secondary | ICD-10-CM | POA: Diagnosis not present

## 2021-10-09 DIAGNOSIS — I10 Essential (primary) hypertension: Secondary | ICD-10-CM

## 2021-10-09 MED ORDER — ROSUVASTATIN CALCIUM 10 MG PO TABS: ORAL_TABLET | ORAL | 2 refills | Status: DC

## 2021-10-09 NOTE — Patient Instructions (Signed)
A few things to remember from today's visit:  Abdominal aortic atherosclerosis ([place])  Iliac artery aneurysm, right (HCC)  Hypercalcemia - Plan: Parathyroid hormone, intact (no Ca), VITAMIN D 25 Hydroxy (Vit-D Deficiency, Fractures)  Bilateral lower extremity edema - Plan: Basic metabolic panel  If you need refills please call your pharmacy. Do not use My Chart to request refills or for acute issues that need immediate attention.   Continue Furosemide 20 mg daily, you can add an extra at noon if needed. Compression stockings. Arrange appt with cardiologist.  Continue monitoring rib cage pain, seem musculoskeletal. Monitor for rash.  Please be sure medication list is accurate. If a new problem present, please set up appointment sooner than planned today.

## 2021-10-10 LAB — BASIC METABOLIC PANEL
BUN: 7 mg/dL (ref 6–23)
CO2: 23 mEq/L (ref 19–32)
Calcium: 10.8 mg/dL — ABNORMAL HIGH (ref 8.4–10.5)
Chloride: 105 mEq/L (ref 96–112)
Creatinine, Ser: 0.78 mg/dL (ref 0.40–1.20)
GFR: 73.77 mL/min (ref >60.00)
Glucose, Bld: 92 mg/dL (ref 70–99)
Potassium: 3.8 mEq/L (ref 3.5–5.1)
Sodium: 140 mEq/L (ref 135–145)

## 2021-10-10 LAB — VITAMIN D 25 HYDROXY (VIT D DEFICIENCY, FRACTURES): VITD: 18.19 ng/mL — ABNORMAL LOW (ref 30.00–100.00)

## 2021-10-11 LAB — EXTRA SPECIMEN

## 2021-10-11 LAB — PARATHYROID HORMONE, INTACT (NO CA): PTH: 131 pg/mL — ABNORMAL HIGH (ref 16–77)

## 2021-10-15 ENCOUNTER — Other Ambulatory Visit: Payer: Self-pay

## 2021-10-15 DIAGNOSIS — R600 Localized edema: Secondary | ICD-10-CM

## 2021-10-15 DIAGNOSIS — K219 Gastro-esophageal reflux disease without esophagitis: Secondary | ICD-10-CM

## 2021-10-15 MED ORDER — MECLIZINE HCL 25 MG PO TABS: ORAL_TABLET | ORAL | 1 refills | Status: DC

## 2021-10-15 MED ORDER — PANTOPRAZOLE SODIUM 40 MG PO TBEC: 40.0000 mg | DELAYED_RELEASE_TABLET | Freq: Every day | ORAL | 1 refills | Status: DC

## 2021-10-15 MED ORDER — FUROSEMIDE 20 MG PO TABS: ORAL_TABLET | ORAL | 1 refills | Status: DC

## 2021-10-19 ENCOUNTER — Other Ambulatory Visit: Payer: Self-pay | Admitting: Family Medicine

## 2021-10-19 DIAGNOSIS — I10 Essential (primary) hypertension: Secondary | ICD-10-CM

## 2021-10-19 DIAGNOSIS — R600 Localized edema: Secondary | ICD-10-CM

## 2021-11-08 DIAGNOSIS — C678 Malignant neoplasm of overlapping sites of bladder: Secondary | ICD-10-CM | POA: Diagnosis not present

## 2021-11-09 ENCOUNTER — Other Ambulatory Visit: Payer: Self-pay | Admitting: Urology

## 2021-11-13 ENCOUNTER — Encounter (HOSPITAL_BASED_OUTPATIENT_CLINIC_OR_DEPARTMENT_OTHER): Payer: Self-pay | Admitting: Urology

## 2021-11-13 NOTE — Progress Notes (Signed)
Spoke w/ via phone for pre-op interview--- pt Lab needs dos----  [employer] results------ current ekg in epic/ chart COVID test -----patient states asymptomatic no test needed Arrive at ------- 1045 on 11-20-2021 NPO after MN NO Solid Food.  Clear liquids from MN until--- 0945 Med rec completed Medications to take morning of surgery ----- none Diabetic medication ----- n/a Patient instructed no nail polish to be worn day of surgery Patient instructed to bring photo id and insurance card day of surgery Patient aware to have Driver (ride ) / caregiver   for 24 hours after surgery -- sister , Insurance account manager Patient Special Instructions ----- n/a Pre-Op special Istructions ----- n/a Patient verbalized understanding of instructions that were given at this phone interview. Patient denies shortness of breath, chest pain, fever, cough at this phone interview.    Anesthesia Review: HTN;  mild non-obstructive CAD;  hx breast cancer s/p chemoradiation 2000;  GERD Pt denies cardiac s&s, sob, but does have bilateral lower extremity edema.  PCP: Dr Jacinto Reap. [place] (lov 10-09-2021) Cardiologist : previously seen by dr hilty Cassell Clement 07-16-2016 epic) EKG : 01-09-2021 Echo : 11-01-2019 Stress test: 07-31-2016 Cardiac Cath : 05-10-2003 epic Activity level: no issues

## 2021-11-19 NOTE — H&P (Signed)
76 year old female with a past medical hisotry of Paget's disease of the breast, underwent chemo and radiation, past smoker, and nephrolithiasis. She was last in our office in 2019 and saw Dr. Pilar Jarvis. She reports 1 year of intermittent dysuria, worsening frequency, intermittent gross hematuria and incontinence associated with urgency. She dos not feel her urinary stream is strong and reports that it also splits. She does not feel she empties well. She denies fevers and chills. She denies passage of stone material. She currently has lower leg edema, non pitting. She has been checked by cardiology for this. Urinalysis is clear and PVR is 25mls.   -03/02/20-patient with history of intermittent dysuria and gross hematuria with incontinence as above. Underwent CT renal protocol scan 02/17/2020 and showed full very small nonobstructing right lower pole 1 mm stone otherwise normal upper urinary tract. See report below. Here for cysto to assess bladder.  Cysto performed today shows small papillary lesions in the left floor the bladder suspicious for low-grade transitional cell tumor measuring 1-2 cm x 2 lesions. Functional capacity was approximately 250 cc and bonney/Marshalll test was negative with Valsalva. There is a stress mediated contraction causing leakage in the standing position.  CLINICAL DATA: 76 year old female with history of gross hematuria.   EXAM:  CT ABDOMEN AND PELVIS WITHOUT AND WITH CONTRAST   TECHNIQUE:  Multidetector CT imaging of the abdomen and pelvis was performed  following the standard protocol before and following the bolus  administration of intravenous contrast.   CONTRAST: 125 mL of Omnipaque 300.   COMPARISON: CT the abdomen and pelvis 11/18/2017.   FINDINGS:  Lower chest: Atherosclerotic calcifications in the descending  thoracic aorta, as well as the left main, left anterior descending  and right coronary arteries.   Hepatobiliary: No suspicious cystic or solid  hepatic lesions. No  intra or extrahepatic biliary ductal dilatation. Gallbladder is  normal in appearance.   Pancreas: No pancreatic mass. No pancreatic ductal dilatation. No  pancreatic or peripancreatic fluid collections or inflammatory  changes.   Spleen: Unremarkable.   Adrenals/Urinary Tract: 1 mm nonobstructive of the right kidney  calculus in the lower pole collecting system. No additional calculi  are noted within the left renal collecting system, along the course  of either ureter, or within the lumen of the urinary bladder. No  hydroureteronephrosis. Subcentimeter low-attenuation lesions in both  kidneys, too small to characterize, but statistically likely to  represent tiny cysts. No definite aggressive appearing renal  lesions. On postcontrast delayed images there are no definite  filling defects within the collecting system of either kidney, along  the course of either ureter, or within the lumen of the urinary  bladder to strongly suggest the presence of urothelial neoplasm at  this time. Urinary bladder is under distended, but otherwise  unremarkable in appearance. Bilateral adrenal glands are normal in  appearance.   Stomach/Bowel: Normal appearance of the stomach. No pathologic  dilatation of small bowel or colon. Numerous colonic diverticulae  are noted, particularly in the sigmoid colon and descending colon,  without surrounding inflammatory changes to suggest an acute  diverticulitis at this time. The appendix is not confidently  identified and may be surgically absent. Regardless, there are no  inflammatory changes noted adjacent to the cecum to suggest the  presence of an acute appendicitis at this time.   Vascular/Lymphatic: Aortic atherosclerosis, without evidence of  aneurysm or dissection in the abdominal or pelvic vasculature. No  lymphadenopathy noted in the abdomen or pelvis.  Reproductive: Status post hysterectomy. Ovaries are not confidently   identified may be surgically absent or atrophic.   Other: No significant volume of ascites. No pneumoperitoneum.   Musculoskeletal: There are no aggressive appearing lytic or blastic  lesions noted in the visualized portions of the skeleton.   IMPRESSION:  1. 1 mm nonobstructive calculus in the lower pole collecting system  of the right kidney. No ureteral stones or findings of urinary tract  obstruction are noted at this time.  2. Colonic diverticulosis without evidence of acute diverticulitis  at this time.  3. Aortic atherosclerosis, in addition to left main and 2 vessel  coronary artery disease. Assessment for potential risk factor  modification, dietary therapy or pharmacologic therapy may be  warranted, if clinically indicated.  4. Additional incidental findings, as above.   CLINICAL DATA: 76 year old female with history of gross hematuria.   EXAM:  CT ABDOMEN AND PELVIS WITHOUT AND WITH CONTRAST   TECHNIQUE:  Multidetector CT imaging of the abdomen and pelvis was performed  following the standard protocol before and following the bolus  administration of intravenous contrast.   CONTRAST: 125 mL of Omnipaque 300.   COMPARISON: CT the abdomen and pelvis 11/18/2017.   FINDINGS:  Lower chest: Atherosclerotic calcifications in the descending  thoracic aorta, as well as the left main, left anterior descending  and right coronary arteries.   Hepatobiliary: No suspicious cystic or solid hepatic lesions. No  intra or extrahepatic biliary ductal dilatation. Gallbladder is  normal in appearance.   Pancreas: No pancreatic mass. No pancreatic ductal dilatation. No  pancreatic or peripancreatic fluid collections or inflammatory  changes.   Spleen: Unremarkable.   Adrenals/Urinary Tract: 1 mm nonobstructive of the right kidney  calculus in the lower pole collecting system. No additional calculi  are noted within the left renal collecting system, along the course  of  either ureter, or within the lumen of the urinary bladder. No  hydroureteronephrosis. Subcentimeter low-attenuation lesions in both  kidneys, too small to characterize, but statistically likely to  represent tiny cysts. No definite aggressive appearing renal  lesions. On postcontrast delayed images there are no definite  filling defects within the collecting system of either kidney, along  the course of either ureter, or within the lumen of the urinary  bladder to strongly suggest the presence of urothelial neoplasm at  this time. Urinary bladder is under distended, but otherwise  unremarkable in appearance. Bilateral adrenal glands are normal in  appearance.   Stomach/Bowel: Normal appearance of the stomach. No pathologic  dilatation of small bowel or colon. Numerous colonic diverticulae  are noted, particularly in the sigmoid colon and descending colon,  without surrounding inflammatory changes to suggest an acute  diverticulitis at this time. The appendix is not confidently  identified and may be surgically absent. Regardless, there are no  inflammatory changes noted adjacent to the cecum to suggest the  presence of an acute appendicitis at this time.   Vascular/Lymphatic: Aortic atherosclerosis, without evidence of  aneurysm or dissection in the abdominal or pelvic vasculature. No  lymphadenopathy noted in the abdomen or pelvis.   Reproductive: Status post hysterectomy. Ovaries are not confidently  identified may be surgically absent or atrophic.   Other: No significant volume of ascites. No pneumoperitoneum.   Musculoskeletal: There are no aggressive appearing lytic or blastic  lesions noted in the visualized portions of the skeleton.   IMPRESSION:  1. 1 mm nonobstructive calculus in the lower pole collecting system  of the  right kidney. No ureteral stones or findings of urinary tract  obstruction are noted at this time.  2. Colonic diverticulosis without evidence of acute  diverticulitis  at this time.  3. Aortic atherosclerosis, in addition to left main and 2 vessel  coronary artery disease. Assessment for potential risk factor  modification, dietary therapy or pharmacologic therapy may be  warranted, if clinically indicated.  4. Additional incidental findings, as above.   -03/29/20-patient with history of hematuria and was found to have papillary bladder tumor. Underwent cysto TURBT on 03/21/2020. The lesions were completely removed utilizing rigid biopsy forceps with deep biopsies showing some fat. Base was cauterized. The pathology shows high-grade noninvasive urothelial carcinoma. No postoperative issues. Here to discuss next steps of management.  -06/28/20-patient with history of high-grade superficial transitional cell carcinoma of the bladder diagnosed with TURBT on 03/21/2020. No interim GU issues. Here for follow-up surveillance cystoscopy and cytology.  Cysto performed today and shows: No evidence of recurrent bladder lesions   -09/29/20-patient with history of superficial high-grade transitional cell carcinoma of the bladder diagnosed with TURBT 03/21/2020. Has had negative surveillance cysto back in March of 2022. No interim GU issues here for follow-up surveillance cysto.  Cysto is performed today and shows: Normal bladder without evidence of mucosal lesions. Slight erythema in the left bladder base but it looked more like hypervascularity not a recurrent lesion.  Micro urinalysis shows 6-10 epithelial cells no significant hematuria.  -12/28/20-patient with history of superficial high-grade transitional cell carcinoma bladder diagnosed via TURBT in November 21. Has had negative surveillance cysto since that time. No interim GU issues.  Micro urinalysis  Cysto is performed today and shows: What appears to be a small slightly raised recurrent area on the left lateral bladder wall. Remainder of the bladder. Grossly normal.  -01/15/21-patient with history of  superficial high-grade urothelial carcinoma reason diagnosed November of 2021. Had recurrent lesion on surveillance cysto in underwent cysto TURBT on 01/09/2021. Pathology shows noninvasive high-grade recurrent lesion. No postoperative issues. Here to discuss next steps of management.  -04/25/21-patient with history of superficial high-grade urothelial carcinoma diagnosed originally in November 2021 with recurrent lesion September 2022. Subsequently is undergone intravesical BCG completed 03/13/2021. Here now for follow-up surveillance cystoscopy.  Cystoscopy is performed today and shows: No evidence of recurrent mucosal lesions  -07/23/21-patient with history of superficial high-grade urothelial carcinoma diagnosed originally in November 2021 with recurrent lesion in September 22. Subsequently underwent BCG intravesical therapy completed 03/13/2021. No interim GU issues. Here for follow-up surveillance cystoscopy  Micro urinalysis is clear on urine spun sediment  Cystoscopy is performed today and shows: What appears to be probable local recurrence near the dome of the bladder probably 1 to 2 cm in size slightly raised and adjacent to prior TURBT site. Remainder of the bladder appeared without recurrent lesions.  -08/10/21-patient with history of superficial high-grade urothelial carcinoma originally diagnosed in November 2021 with recurrent lesion in September 2022. Status post BCG completed in November 22. Had what appeared to be recurrent lesion and underwent cystoscopy and bladder biopsy with fulguration on 07/31/2021 with gemcitabine instillation. Pathology fortunately returned as benign urothelium on pathology surprisingly. No postprocedural issues. Here for follow-up.  Micro urinalysis is clear on urine spun sediment  -11/08/21-patient with history of superficial high-grade urothelial carcinoma originally diagnosed in November 21 with last documented recurrence in September 2022. Underwent BCG completed in  November 2022. Had recurrent lesion which was biopsied and found to be benign on 07/31/2021 so last documented recurrence  remains in September 22. No interim GU issues. Here for follow-up surveillance cystoscopy.  Micro urinalysis  Cystoscopy performed today and shows what appears to be a recurrent area in the posterior dome area approximate 1 to 2 cm in size. There is a slightly erythematous area in the floor of the bladder adjacent to prior TURBT site but this is likely vascular. No other lesions noted.     ALLERGIES: CODEINE Statins Tamsulosin - Itching (Moderate to Severe)    MEDICATIONS: Align  Losartan Potassium  Meclizine Hcl  Ondansetron Odt 8 mg tablet,disintegrating 1 tablet PO Q8PRN post-op nausea  Potassium  Protonix  Tramadol Hcl     GU PSH: Bladder Instill AntiCA Agent - 03/13/2021, 03/06/2021, 02/27/2021, 02/20/2021, 02/13/2021, 02/06/2021 Cystoscopy - 07/23/2021, 04/25/2021, 12/28/2020, 09/29/2020, 06/28/2020, 03/03/2020 Cystoscopy TURBT 2-5 cm - 01/09/2021, 03/21/2020 Hysterectomy Locm 300-399Mg /Ml Iodine,1Ml - 02/17/2020       PSH Notes: Right Cyst under arm removed  Right Mole removed off Nipple    NON-GU PSH: Breast Reconstruction Hernia Repair Mastectomy, Simple, Complete, Left Tonsillectomy     GU PMH: Bladder Cancer overlapping sites - 08/10/2021, - 07/23/2021, - 04/25/2021, - 03/13/2021, - 03/06/2021, - 02/27/2021, - 02/20/2021, - 02/13/2021, - 02/06/2021, - 01/15/2021, - 12/28/2020, - 09/29/2020, - 06/28/2020, - 03/29/2020 Mixed incontinence - 09/29/2020, - 03/03/2020 Bladder tumor/neoplasm - 03/03/2020 Gross hematuria - 03/03/2020, - 02/17/2020 Dysuria - 02/08/2020 Incomplete bladder emptying - 02/08/2020 Splitting of urinary stream - 02/08/2020 Urinary Frequency - 02/08/2020 Weak Urinary Stream - 02/08/2020 Ureteral calculus - 2019    NON-GU PMH: Pyuria/other UA findings - 01/15/2021 Breast Cancer, History GERD Hypercholesterolemia Hypertension    FAMILY HISTORY:  2 daughters - Daughter 2 sons - Son   SOCIAL HISTORY: Marital Status: Widowed Preferred Language: English Current Smoking Status: Patient does not smoke anymore.   Tobacco Use Assessment Completed: Used Tobacco in last 30 days? Has never drank.  Drinks 1 caffeinated drink per day. Patient's occupation is/was Retired.    REVIEW OF SYSTEMS:    GU Review Female:   Patient denies frequent urination, hard to postpone urination, burning /pain with urination, get up at night to urinate, leakage of urine, stream starts and stops, trouble starting your stream, have to strain to urinate, and being pregnant.  Gastrointestinal (Upper):   Patient denies indigestion/ heartburn, nausea, and vomiting.  Gastrointestinal (Lower):   Patient denies diarrhea and constipation.  Constitutional:   Patient denies fever, night sweats, weight loss, and fatigue.  Skin:   Patient denies skin rash/ lesion and itching.  Eyes:   Patient denies blurred vision and double vision.  Ears/ Nose/ Throat:   Patient denies sore throat and sinus problems.  Hematologic/Lymphatic:   Patient denies swollen glands and easy bruising.  Cardiovascular:   Patient denies leg swelling and chest pains.  Respiratory:   Patient denies cough and shortness of breath.  Endocrine:   Patient denies excessive thirst.  Musculoskeletal:   Patient denies back pain and joint pain.  Neurological:   Patient denies headaches and dizziness.  Psychologic:   Patient denies depression and anxiety.   VITAL SIGNS: None   GU PHYSICAL EXAMINATION:    External Genitalia: No hirsutism, no rash, no scarring, no cyst, no erythematous lesion, no papular lesion, no blanched lesion, no warty lesion. No edema.  Urethral Meatus: Normal size. Normal position. No discharge.  Urethra: No tenderness, no mass, no scarring. No hypermobility. No leakage.  Bladder: Normal to palpation, no tenderness, no mass, normal size.   MULTI-SYSTEM PHYSICAL  EXAMINATION:     Constitutional: Well-nourished. No physical deformities. Normally developed. Good grooming.  Neck: Neck symmetrical, not swollen. Normal tracheal position.  Respiratory: No labored breathing, no use of accessory muscles.   Cardiovascular: Normal temperature, normal extremity pulses, no swelling, no varicosities.  Lymphatic: No enlargement of neck, axillae, groin.  Skin: No paleness, no jaundice, no cyanosis. No lesion, no ulcer, no rash.  Neurologic / Psychiatric: Oriented to time, oriented to place, oriented to person. No depression, no anxiety, no agitation.  Eyes: Normal conjunctivae. Normal eyelids.  Ears, Nose, Mouth, and Throat: Left ear no scars, no lesions, no masses. Right ear no scars, no lesions, no masses. Nose no scars, no lesions, no masses. Normal hearing. Normal lips.  Musculoskeletal: Normal gait and station of head and neck.     PAST DATA REVIEW: None   PROCEDURES:         Flexible Cystoscopy - 52000  Risks, benefits, and some of the potential complications of the procedure were discussed at length with the patient including infection, bleeding, voiding discomfort, urinary retention, fever, chills, sepsis, and others. All questions were answered. Informed consent was obtained. Antibiotic prophylaxis was given. Sterile technique and intraurethral analgesia were used.  Meatus:  Normal size. Normal location. Normal condition.  Urethra:  No hypermobility. No leakage.  Ureteral Orifices:  Normal location. Normal size. Normal shape. Effluxed clear urine.  Bladder:  Cystoscopy performed today and shows what appears to be a recurrent area in the posterior dome area approximate 1 to 2 cm in size. There is a slightly erythematous area in the floor of the bladder adjacent to prior TURBT site but this is likely vascular. No other lesions noted..      The lower urinary tract was carefully examined. The procedure was well-tolerated and without complications. Antibiotic instructions were  given. Instructions were given to call the office immediately for bloody urine, difficulty urinating, urinary retention, painful or frequent urination, fever, chills, nausea, vomiting or other illness. The patient stated that she understood these instructions and would comply with them.         Urinalysis - 81003 Dipstick Dipstick Cont'd  Color: Yellow Bilirubin: Neg mg/dL  Appearance: Clear Ketones: Neg mg/dL  Specific Gravity: 1.025 Blood: Neg ery/uL  pH: 5.5 Protein: Neg mg/dL  Glucose: Neg mg/dL Urobilinogen: 0.2 mg/dL    Nitrites: Neg    Leukocyte Esterase: Neg leu/uL    ASSESSMENT:      ICD-10 Details  1 GU:   Bladder Cancer overlapping sites - W09.8 Acute, Complicated Injury   PLAN:           Document Letter(s):  Created for Patient: Clinical Summary         Notes:   I discussed cystoscopic findings with the patient. Recommended cystoscopy bilateral retrogrades and TURBT with gemcitabine instillation. Risk and benefits discussed as outlined below.  TURBT consent: I have discussed with the patient the risks, benefits of TURBT which include but are not limited to: Bleeding, infection, damage to the bladder with potential perforation of the bladder, damage to surrounding organs, possible need for further procedures including open repair and catheterization, possibility of nonhealing area within the bladder, urgency, frequency which may be refractory to medications. I pointed out that in some occasions after resection of the bladder tumor, mitomycin-C/Gemcitabine chemotherapy may be instilled into the bladder. The risks associated with this therapy include but are not limited to: Refractory or new onset urgency, frequency, dysuria, infrequently severe systemic side effects secondary to  mitomycin-C. After full discussion of the risks, benefits and alternatives, the patient has consented to the above procedure and desires to proceed.

## 2021-11-20 ENCOUNTER — Encounter (HOSPITAL_BASED_OUTPATIENT_CLINIC_OR_DEPARTMENT_OTHER)
Admission: RE | Disposition: A | Discharge status: Home / Self Care | Payer: Self-pay | Source: Home / Self Care | Attending: Urology

## 2021-11-20 ENCOUNTER — Ambulatory Visit (HOSPITAL_BASED_OUTPATIENT_CLINIC_OR_DEPARTMENT_OTHER): Payer: Medicare Other | Admitting: Anesthesiology

## 2021-11-20 ENCOUNTER — Encounter (HOSPITAL_BASED_OUTPATIENT_CLINIC_OR_DEPARTMENT_OTHER): Payer: Self-pay | Admitting: Urology

## 2021-11-20 ENCOUNTER — Ambulatory Visit (HOSPITAL_BASED_OUTPATIENT_CLINIC_OR_DEPARTMENT_OTHER)
Admission: RE | Admit: 2021-11-20 | Discharge: 2021-11-20 | Disposition: A | Discharge status: Home / Self Care | Payer: Medicare Other | Attending: Urology | Admitting: Urology

## 2021-11-20 DIAGNOSIS — Z87442 Personal history of urinary calculi: Secondary | ICD-10-CM | POA: Insufficient documentation

## 2021-11-20 DIAGNOSIS — I2510 Atherosclerotic heart disease of native coronary artery without angina pectoris: Secondary | ICD-10-CM | POA: Insufficient documentation

## 2021-11-20 DIAGNOSIS — Z923 Personal history of irradiation: Secondary | ICD-10-CM | POA: Insufficient documentation

## 2021-11-20 DIAGNOSIS — K219 Gastro-esophageal reflux disease without esophagitis: Secondary | ICD-10-CM | POA: Insufficient documentation

## 2021-11-20 DIAGNOSIS — Z87891 Personal history of nicotine dependence: Secondary | ICD-10-CM | POA: Insufficient documentation

## 2021-11-20 DIAGNOSIS — K449 Diaphragmatic hernia without obstruction or gangrene: Secondary | ICD-10-CM | POA: Insufficient documentation

## 2021-11-20 DIAGNOSIS — C679 Malignant neoplasm of bladder, unspecified: Secondary | ICD-10-CM

## 2021-11-20 DIAGNOSIS — Z01818 Encounter for other preprocedural examination: Secondary | ICD-10-CM

## 2021-11-20 DIAGNOSIS — I10 Essential (primary) hypertension: Secondary | ICD-10-CM

## 2021-11-20 DIAGNOSIS — D090 Carcinoma in situ of bladder: Secondary | ICD-10-CM | POA: Diagnosis not present

## 2021-11-20 DIAGNOSIS — Z9221 Personal history of antineoplastic chemotherapy: Secondary | ICD-10-CM | POA: Insufficient documentation

## 2021-11-20 DIAGNOSIS — Z853 Personal history of malignant neoplasm of breast: Secondary | ICD-10-CM | POA: Insufficient documentation

## 2021-11-20 DIAGNOSIS — M797 Fibromyalgia: Secondary | ICD-10-CM | POA: Diagnosis not present

## 2021-11-20 HISTORY — PX: CYSTOSCOPY W/ RETROGRADES: SHX1426

## 2021-11-20 HISTORY — DX: Personal history of colonic polyps: Z86.010

## 2021-11-20 HISTORY — DX: Calculus of kidney: N20.0

## 2021-11-20 HISTORY — DX: Personal history of adenomatous and serrated colon polyps: Z86.0101

## 2021-11-20 HISTORY — PX: TRANSURETHRAL RESECTION OF BLADDER TUMOR WITH MITOMYCIN-C: SHX6459

## 2021-11-20 SURGERY — TRANSURETHRAL RESECTION OF BLADDER TUMOR WITH MITOMYCIN-C
Anesthesia: General | Site: Pelvis

## 2021-11-20 MED ORDER — PHENYLEPHRINE 80 MCG/ML (10ML) SYRINGE FOR IV PUSH (FOR BLOOD PRESSURE SUPPORT)
PREFILLED_SYRINGE | INTRAVENOUS | Status: DC | PRN
  Administered 2021-11-20: 160 ug via INTRAVENOUS

## 2021-11-20 MED ORDER — STERILE WATER FOR IRRIGATION IR SOLN
Status: DC | PRN
  Administered 2021-11-20: 3000 mL via INTRAVESICAL

## 2021-11-20 MED ORDER — ONDANSETRON HCL 4 MG/2ML IJ SOLN
INTRAMUSCULAR | Status: DC | PRN
  Administered 2021-11-20: 4 mg via INTRAVENOUS

## 2021-11-20 MED ORDER — LIDOCAINE 2% (20 MG/ML) 5 ML SYRINGE
INTRAMUSCULAR | Status: DC | PRN
  Administered 2021-11-20: 80 mg via INTRAVENOUS

## 2021-11-20 MED ORDER — PROPOFOL 500 MG/50ML IV EMUL
INTRAVENOUS | Status: DC | PRN
  Administered 2021-11-20: 150 ug/kg/min via INTRAVENOUS

## 2021-11-20 MED ORDER — CEFAZOLIN SODIUM-DEXTROSE 2-4 GM/100ML-% IV SOLN
INTRAVENOUS | Status: AC
  Filled 2021-11-20: qty 100

## 2021-11-20 MED ORDER — FENTANYL CITRATE (PF) 100 MCG/2ML IJ SOLN
INTRAMUSCULAR | Status: AC
  Filled 2021-11-20: qty 2

## 2021-11-20 MED ORDER — LACTATED RINGERS IV SOLN: INTRAVENOUS | Status: DC

## 2021-11-20 MED ORDER — IOHEXOL 300 MG/ML  SOLN
INTRAMUSCULAR | Status: DC | PRN
  Administered 2021-11-20: 10 mL

## 2021-11-20 MED ORDER — ACETAMINOPHEN 500 MG PO TABS
1000.0000 mg | ORAL_TABLET | Freq: Once | ORAL | Status: AC
  Administered 2021-11-20: 1000 mg via ORAL

## 2021-11-20 MED ORDER — GEMCITABINE CHEMO FOR BLADDER INSTILLATION 2000 MG
2000.0000 mg | Freq: Once | INTRAVENOUS | Status: AC
  Administered 2021-11-20: 2000 mg via INTRAVESICAL
  Filled 2021-11-20: qty 2000

## 2021-11-20 MED ORDER — PROPOFOL 10 MG/ML IV BOLUS
INTRAVENOUS | Status: AC
  Filled 2021-11-20: qty 20

## 2021-11-20 MED ORDER — TRAMADOL HCL 50 MG PO TABS: 50.0000 mg | ORAL_TABLET | Freq: Four times a day (QID) | ORAL | 0 refills | Status: DC | PRN

## 2021-11-20 MED ORDER — GLYCOPYRROLATE 0.2 MG/ML IJ SOLN
INTRAMUSCULAR | Status: DC | PRN
  Administered 2021-11-20: .2 mg via INTRAVENOUS

## 2021-11-20 MED ORDER — ACETAMINOPHEN 500 MG PO TABS
ORAL_TABLET | ORAL | Status: AC
  Filled 2021-11-20: qty 2

## 2021-11-20 MED ORDER — CEFAZOLIN SODIUM-DEXTROSE 2-4 GM/100ML-% IV SOLN
2.0000 g | INTRAVENOUS | Status: AC
  Administered 2021-11-20: 2 g via INTRAVENOUS

## 2021-11-20 MED ORDER — DEXAMETHASONE SODIUM PHOSPHATE 10 MG/ML IJ SOLN
INTRAMUSCULAR | Status: DC | PRN
  Administered 2021-11-20: 5 mg via INTRAVENOUS

## 2021-11-20 MED ORDER — MIDAZOLAM HCL 2 MG/2ML IJ SOLN
INTRAMUSCULAR | Status: AC
  Filled 2021-11-20: qty 2

## 2021-11-20 MED ORDER — FENTANYL CITRATE (PF) 100 MCG/2ML IJ SOLN
25.0000 ug | INTRAMUSCULAR | Status: DC | PRN
  Administered 2021-11-20: 25 ug via INTRAVENOUS
  Administered 2021-11-20: 50 ug via INTRAVENOUS
  Administered 2021-11-20: 25 ug via INTRAVENOUS

## 2021-11-20 MED ORDER — PROPOFOL 10 MG/ML IV BOLUS
INTRAVENOUS | Status: DC | PRN
  Administered 2021-11-20: 150 mg via INTRAVENOUS

## 2021-11-20 MED ORDER — FENTANYL CITRATE (PF) 250 MCG/5ML IJ SOLN
INTRAMUSCULAR | Status: DC | PRN
  Administered 2021-11-20: 50 ug via INTRAVENOUS

## 2021-11-20 MED ORDER — GEMCITABINE CHEMO FOR BLADDER INSTILLATION 2000 MG: 2000.0000 mg | Freq: Once | INTRAVENOUS | Status: DC

## 2021-11-20 SURGICAL SUPPLY — 28 items
BAG DRAIN URO-CYSTO SKYTR STRL (DRAIN) ×3 IMPLANT
BAG DRN RND TRDRP ANRFLXCHMBR (UROLOGICAL SUPPLIES) ×2
BAG DRN UROCATH (DRAIN) ×2
BAG URINE DRAIN 2000ML AR STRL (UROLOGICAL SUPPLIES) ×1 IMPLANT
BULB IRRIG PATHFIND (MISCELLANEOUS) IMPLANT
CATH FOLEY 2WAY SLVR  5CC 18FR (CATHETERS) ×3
CATH FOLEY 2WAY SLVR 5CC 18FR (CATHETERS) IMPLANT
CATH URET 5FR 28IN CONE TIP (BALLOONS)
CATH URET 5FR 70CM CONE TIP (BALLOONS) IMPLANT
CATH URETL OPEN 5X70 (CATHETERS) IMPLANT
CLOTH BEACON ORANGE TIMEOUT ST (SAFETY) ×3 IMPLANT
ELECT REM PT RETURN 9FT ADLT (ELECTROSURGICAL)
ELECTRODE REM PT RTRN 9FT ADLT (ELECTROSURGICAL) IMPLANT
FIBER LASER FLEXIVA 365 (UROLOGICAL SUPPLIES) IMPLANT
GLOVE BIO SURGEON STRL SZ7.5 (GLOVE) ×3 IMPLANT
GOWN STRL REUS W/TWL LRG LVL3 (GOWN DISPOSABLE) ×3 IMPLANT
GUIDEWIRE ANG ZIPWIRE 038X150 (WIRE) IMPLANT
GUIDEWIRE STR DUAL SENSOR (WIRE) IMPLANT
IV NS IRRIG 3000ML ARTHROMATIC (IV SOLUTION) ×3 IMPLANT
KIT TURNOVER CYSTO (KITS) ×3 IMPLANT
MANIFOLD NEPTUNE II (INSTRUMENTS) IMPLANT
PACK CYSTO (CUSTOM PROCEDURE TRAY) ×3 IMPLANT
SYR 20ML LL LF (SYRINGE) ×3 IMPLANT
SYR TOOMEY IRRIG 70ML (MISCELLANEOUS)
SYRINGE TOOMEY IRRIG 70ML (MISCELLANEOUS) IMPLANT
TRACTIP FLEXIVA PULS ID 200XHI (Laser) IMPLANT
TRACTIP FLEXIVA PULSE ID 200 (Laser)
TUBE CONNECTING 12X1/4 (SUCTIONS) IMPLANT

## 2021-11-20 NOTE — Anesthesia Procedure Notes (Signed)
Procedure Name: LMA Insertion Date/Time: 11/20/2021 12:18 PM  Performed by: Clearnce Sorrel, CRNAPre-anesthesia Checklist: Patient identified, Emergency Drugs available, Suction available and Patient being monitored Patient Re-evaluated:Patient Re-evaluated prior to induction Oxygen Delivery Method: Circle System Utilized Preoxygenation: Pre-oxygenation with 100% oxygen Induction Type: IV induction Ventilation: Mask ventilation without difficulty LMA: LMA inserted LMA Size: 4.0 Number of attempts: 1 Airway Equipment and Method: Bite block Placement Confirmation: positive ETCO2 Tube secured with: Tape Dental Injury: Teeth and Oropharynx as per pre-operative assessment

## 2021-11-20 NOTE — Transfer of Care (Signed)
Immediate Anesthesia Transfer of Care Note  Patient: [name]  Procedure(s) Performed: TRANSURETHRAL RESECTION OF BLADDER TUMOR WITH POSTOPERATIVE  GEMCITABINE (Bladder) CYSTOSCOPY WITH RETROGRADE PYELOGRAM (Bilateral: Pelvis)  Patient Location: PACU  Anesthesia Type:General  Level of Consciousness: drowsy and patient cooperative  Airway & Oxygen Therapy: Patient Spontanous Breathing  Post-op Assessment: Report given to RN and Post -op Vital signs reviewed and stable  Post vital signs: Reviewed and stable  Last Vitals:  Vitals Value Taken Time  BP    Temp    Pulse    Resp 17 11/20/21 1303  SpO2    Vitals shown include unvalidated device data.  Last Pain:  Vitals:   11/20/21 1118  TempSrc: Oral  PainSc: 0-No pain      Patients Stated Pain Goal: 4 (58/72/76 1848)  Complications: No notable events documented.

## 2021-11-20 NOTE — Interval H&P Note (Signed)
History and Physical Interval Note:  11/20/2021 11:56 AM  Worley  has presented today for surgery, with the diagnosis of RECURRENT BLADDER CANCER.  The various methods of treatment have been discussed with the patient and family. After consideration of risks, benefits and other options for treatment, the patient has consented to  Procedure(s) with comments: TRANSURETHRAL RESECTION OF BLADDER TUMOR WITH GEMCITABINE (N/A) - 30 MINS CYSTOSCOPY WITH RETROGRADE PYELOGRAM (Bilateral) as a surgical intervention.  The patient's history has been reviewed, patient examined, no change in status, stable for surgery.  I have reviewed the patient's chart and labs.  Questions were answered to the patient's satisfaction.     Remi Haggard

## 2021-11-20 NOTE — Anesthesia Preprocedure Evaluation (Addendum)
Anesthesia Evaluation  Patient identified by MRN, date of birth, ID band Patient awake    Reviewed: Allergy & Precautions, NPO status , Patient's Chart, lab work & pertinent test results  Airway Mallampati: II  TM Distance: >3 FB Neck ROM: Full    Dental  (+) Edentulous Upper, Missing, Dental Advisory Given, Upper Dentures,    Pulmonary neg pulmonary ROS, former smoker,    Pulmonary exam normal breath sounds clear to auscultation       Cardiovascular hypertension, Pt. on medications + CAD  Normal cardiovascular exam Rhythm:Regular Rate:Normal  TTE 2021 1. Left ventricular ejection fraction, by estimation, is 60 to 65%. The  left ventricle has normal function. The left ventricle has no regional  wall motion abnormalities. There is mild concentric left ventricular  hypertrophy. Left ventricular diastolic  parameters are consistent with Grade I diastolic dysfunction (impaired  relaxation).  2. Right ventricular systolic function is normal. The right ventricular  size is normal.  3. The mitral valve is normal in structure. No evidence of mitral valve  regurgitation. No evidence of mitral stenosis.  4. The aortic valve is normal in structure. Aortic valve regurgitation is  not visualized. No aortic stenosis is present.  5. The inferior vena cava is normal in size with greater than 50%  respiratory variability, suggesting right atrial pressure of 3 mmHg.   Stress Test 2018 ? The left ventricular ejection fraction is mildly decreased (45-54%). Nuclear stress EF: 52%. Blood pressure demonstrated a normal response to exercise. There was no ST segment deviation noted during stress. No T wave inversion was noted during stress. The study is normal. This is a low risk study.    Neuro/Psych PSYCHIATRIC DISORDERS Depression negative neurological ROS     GI/Hepatic Neg liver ROS, hiatal hernia, GERD  ,  Endo/Other  negative  endocrine ROS  Renal/GU negative Renal ROS  negative genitourinary   Musculoskeletal  (+) Arthritis , Fibromyalgia -  Abdominal   Peds  Hematology negative hematology ROS (+)   Anesthesia Other Findings   Reproductive/Obstetrics                            Anesthesia Physical Anesthesia Plan  ASA: 3  Anesthesia Plan: General   Post-op Pain Management: Tylenol PO (pre-op)*   Induction: Intravenous  PONV Risk Score and Plan: 3 and Ondansetron, Dexamethasone, Treatment may vary due to age or medical condition and TIVA  Airway Management Planned: LMA  Additional Equipment:   Intra-op Plan:   Post-operative Plan: Extubation in OR  Informed Consent: I have reviewed the patients History and Physical, chart, labs and discussed the procedure including the risks, benefits and alternatives for the proposed anesthesia with the patient or authorized representative who has indicated his/her understanding and acceptance.     Dental advisory given  Plan Discussed with: CRNA  Anesthesia Plan Comments:        Anesthesia Quick Evaluation

## 2021-11-20 NOTE — Interval H&P Note (Signed)
History and Physical Interval Note:  11/20/2021 10:51 AM  [name]  has presented today for surgery, with the diagnosis of RECURRENT BLADDER CANCER.  The various methods of treatment have been discussed with the patient and family. After consideration of risks, benefits and other options for treatment, the patient has consented to  Procedure(s) with comments: TRANSURETHRAL RESECTION OF BLADDER TUMOR WITH GEMCITABINE (N/A) - 30 MINS CYSTOSCOPY WITH RETROGRADE PYELOGRAM (Bilateral) as a surgical intervention.  The patient's history has been reviewed, patient examined, no change in status, stable for surgery.  I have reviewed the patient's chart and labs.  Questions were answered to the patient's satisfaction.     Remi Haggard

## 2021-11-20 NOTE — Op Note (Signed)
Preoperative diagnosis:  1.  Recurrent bladder cancer  Postoperative diagnosis: 1.  Recurrent bladder cancer  Procedure(s): 1.  Cystoscopy, bilateral retrograde pyelogram with intraoperative interpretation, transurethral section of bladder tumor (small sized), gemcitabine intravesical bladder instillation  Surgeon: Dr. Harold Barban  Anesthesia: General  Complications: None  EBL: Minimal  Specimens: Bladder lesion  Disposition of specimens: To pathology  Intraoperative findings: Area at the dome measuring 1 to 2 cm and slightly raised suggestive of recurrent transitional cell carcinoma.  Area completely removed utilizing rigid biopsy forceps and base cauterized.  Bilateral retrogrades were normal  Indication: Patient is a 76 year old African-American female with history of high-grade transitional cell carcinoma the bladder.  She was found on surveillance cystoscopy to have a suspicious lesion in the dome of the bladder and presents at this time to go cystoscopy retrogrades and TURBT with gemcitabine instillation  Description of procedure:  After obtaining informed consent for the patient she was taken the major cystoscopy suite placed under general anesthesia.  Placed in the dorsolithotomy position genitalia prepped and draped in usual sterile fashion.  Proper pause and timeout was performed.  53 [place] scope was advanced to the bladder with above-noted findings.  Remainder the bladder appeared grossly normal there were prior TURBT sites that were smooth and devoid of tumor.  Bilateral retrograde pyelograms were performed with a 5 French open tip catheter revealed normal filling of both upper tracts without evidence of filling defect or hydronephrosis.  Both upper tract emptied out promptly upon removal of the retrograde catheter.  Attention was then directed towards removal of the bladder lesion in the dome.  In order to preserve pathologic integrity it was felt these would be best  removed utilizing rigid biopsy forceps.  This area was completely removed with 2-3 bites utilizing the rigid biopsy forceps.  The base was then cauterized with Bugbee electrode total surface size was between 1 to 2 cm.  Good hemostasis was noted.  Bladder was emptied and the area reinspected for hemostasis and this was good.  A 16 French Foley catheter was then utilized to drain the bladder.  Patient was awakened from anesthesia and taken back to recovery room in stable condition no immediate complication from the procedure.  In recovery room 2 g of gemcitabine was instilled into the bladder without difficulty.  Gemcitabine left for 1 hour and then emptied and Foley removed.  No complication.

## 2021-11-20 NOTE — Discharge Instructions (Signed)

## 2021-11-21 ENCOUNTER — Encounter (HOSPITAL_BASED_OUTPATIENT_CLINIC_OR_DEPARTMENT_OTHER): Payer: Self-pay | Admitting: Urology

## 2021-11-21 LAB — SURGICAL PATHOLOGY

## 2021-11-21 NOTE — Anesthesia Postprocedure Evaluation (Signed)
Anesthesia Post Note  Patient: [name]  Procedure(s) Performed: TRANSURETHRAL RESECTION OF BLADDER TUMOR WITH POSTOPERATIVE  GEMCITABINE (Bladder) CYSTOSCOPY WITH RETROGRADE PYELOGRAM (Bilateral: Pelvis)     Patient location during evaluation: PACU Anesthesia Type: General Level of consciousness: awake and alert Pain management: pain level controlled Vital Signs Assessment: post-procedure vital signs reviewed and stable Respiratory status: spontaneous breathing, nonlabored ventilation, respiratory function stable and patient connected to nasal cannula oxygen Cardiovascular status: blood pressure returned to baseline and stable Postop Assessment: no apparent nausea or vomiting Anesthetic complications: no   No notable events documented.  Last Vitals:  Vitals:   11/20/21 1445 11/20/21 1512  BP: (!) 158/74 132/60  Pulse: (!) 49 60  Resp: 12 15  Temp:  36.4 C  SpO2: 95% 98%    Last Pain:  Vitals:   11/20/21 1512  TempSrc:   PainSc: 0-No pain                 Pia Jedlicka L Ormond Lazo

## 2021-11-22 ENCOUNTER — Other Ambulatory Visit: Payer: Self-pay | Admitting: Family Medicine

## 2021-11-22 DIAGNOSIS — I10 Essential (primary) hypertension: Secondary | ICD-10-CM

## 2021-11-29 DIAGNOSIS — C678 Malignant neoplasm of overlapping sites of bladder: Secondary | ICD-10-CM | POA: Diagnosis not present

## 2021-12-18 DIAGNOSIS — C678 Malignant neoplasm of overlapping sites of bladder: Secondary | ICD-10-CM | POA: Diagnosis not present

## 2021-12-18 DIAGNOSIS — Z5111 Encounter for antineoplastic chemotherapy: Secondary | ICD-10-CM | POA: Diagnosis not present

## 2021-12-19 ENCOUNTER — Other Ambulatory Visit: Payer: Self-pay | Admitting: Family Medicine

## 2021-12-19 DIAGNOSIS — K219 Gastro-esophageal reflux disease without esophagitis: Secondary | ICD-10-CM

## 2021-12-25 DIAGNOSIS — Z5111 Encounter for antineoplastic chemotherapy: Secondary | ICD-10-CM | POA: Diagnosis not present

## 2021-12-25 DIAGNOSIS — C678 Malignant neoplasm of overlapping sites of bladder: Secondary | ICD-10-CM | POA: Diagnosis not present

## 2022-01-01 DIAGNOSIS — C678 Malignant neoplasm of overlapping sites of bladder: Secondary | ICD-10-CM | POA: Diagnosis not present

## 2022-01-01 DIAGNOSIS — Z5111 Encounter for antineoplastic chemotherapy: Secondary | ICD-10-CM | POA: Diagnosis not present

## 2022-01-08 DIAGNOSIS — C678 Malignant neoplasm of overlapping sites of bladder: Secondary | ICD-10-CM | POA: Diagnosis not present

## 2022-01-08 DIAGNOSIS — Z5111 Encounter for antineoplastic chemotherapy: Secondary | ICD-10-CM | POA: Diagnosis not present

## 2022-01-15 DIAGNOSIS — Z5111 Encounter for antineoplastic chemotherapy: Secondary | ICD-10-CM | POA: Diagnosis not present

## 2022-01-15 DIAGNOSIS — C678 Malignant neoplasm of overlapping sites of bladder: Secondary | ICD-10-CM | POA: Diagnosis not present

## 2022-01-22 DIAGNOSIS — Z5111 Encounter for antineoplastic chemotherapy: Secondary | ICD-10-CM | POA: Diagnosis not present

## 2022-01-22 DIAGNOSIS — C678 Malignant neoplasm of overlapping sites of bladder: Secondary | ICD-10-CM | POA: Diagnosis not present

## 2022-01-27 ENCOUNTER — Other Ambulatory Visit: Payer: Self-pay | Admitting: Family Medicine

## 2022-01-27 DIAGNOSIS — R600 Localized edema: Secondary | ICD-10-CM

## 2022-02-06 NOTE — Progress Notes (Unsigned)
HPI: [name] is a 76 y.o. female, who is here today to follow on recent visit.  25 OH vit D low at 18.1 Review of Systems Rest see pertinent positives and negatives per HPI.  Current Outpatient Medications on File Prior to Visit  Medication Sig Dispense Refill   acetaminophen (TYLENOL) 325 MG tablet Take 650 mg by mouth every 6 (six) hours as needed.     BIOTIN PO Take 1 tablet by mouth daily.     Calcium Citrate-Vitamin D (CALCIUM CITRATE + D3 PO) Take 1 tablet by mouth daily.     cetirizine (ZYRTEC) 10 MG tablet Take 10 mg by mouth at bedtime.     cyanocobalamin 1000 MCG tablet Take 1,000 mcg by mouth daily. Vitamin b 12     furosemide (LASIX) 20 MG tablet Take 1 tablet (20 mg total) by mouth daily. 30 tablet 2   ibuprofen (ADVIL) 200 MG tablet Take 200 mg by mouth every 6 (six) hours as needed.     losartan (COZAAR) 100 MG tablet Take 1 tablet (100 mg total) by mouth daily. 90 tablet 3   meclizine (ANTIVERT) 25 MG tablet TAKE 1 TABLET BY MOUTH 3  TIMES DAILY AS NEEDED FOR  DIZZINESS (Patient taking differently: Take 25 mg by mouth 3 (three) times daily as needed. TAKE 1 TABLET BY MOUTH 3  TIMES DAILY AS NEEDED FOR  DIZZINESS) 90 tablet 1   meloxicam (MOBIC) 15 MG tablet Take 1 tablet (15 mg total) by mouth daily. (Patient taking differently: Take 15 mg by mouth 2 (two) times daily as needed.) 30 tablet 0   Menthol, Topical Analgesic, (BIOFREEZE ROLL-ON EX) Apply topically as needed. 2 or 3 x daily     pantoprazole (PROTONIX) 40 MG tablet Take 1 tablet (40 mg total) by mouth daily. (Patient taking differently: Take 40 mg by mouth at bedtime.) 90 tablet 1   potassium chloride SA (KLOR-CON M) 20 MEQ tablet TAKE 1 TABLET BY MOUTH  DAILY (Patient taking differently: 20 mEq at bedtime. TAKE 1 TABLET BY MOUTH  DAILY) 90 tablet 3   Probiotic Product (ALIGN PO) Take 1 tablet by mouth daily.      rosuvastatin (CRESTOR) 10 MG tablet 2 to 3 x week in am (Patient taking differently: Take  10 mg by mouth 2 (two) times a week. 2 to 3 x week in am) 36 tablet 2   traMADol (ULTRAM) 50 MG tablet Take 1 tablet (50 mg total) by mouth every 6 (six) hours as needed. 20 tablet 0   UNABLE TO FIND Flucinonide usp 0.05 % cream prn for eczema     No current facility-administered medications on file prior to visit.   Past Medical History:  Diagnosis Date   Bilateral lower extremity edema    per pt left ankle greater than right weras left leg compression hose prn   Bladder cancer [facility]) 2021   urologist-- dr Milford Cage--- first dx 2021 s/p  TURBT 's with recurrences   CAD (coronary artery disease)    cardiac cath 01/ 2005 by dr Terrence Dupont,  non-obstructive cad;  nuclear study 07-31-2016  normal with no evidence ischemia,nuclear ef 52%   Diverticulosis of colon    Eczema    Fibromyalgia    GERD (gastroesophageal reflux disease)    Hemorrhoids    Hiatal hernia    History of adenomatous polyp of colon    History of cancer chemotherapy 2000   left breast cancer   History of COVID-19 2022  mild all symptoms resolved   History of diverticulitis of colon    History of gastritis    History of kidney stones    History of left breast cancer 2000   dx 2000 and s/p left mastectomy w/ node dissection;  completed chemo and radiation therapy same year 2000 and completed anti-estrogen 2005;  per pt released from oncologist 2006 and no recurrence   Hyperlipidemia    Hypertension    followed by pcp   Nephrolithiasis    per CT 11-24-2020 bilateral tiny non-obstructive stones   Personal history of radiation therapy 2000   left breast cancer   Wears denture upper    Wears glasses    Allergies  Allergen Reactions   Hydrochlorothiazide Other (See Comments)    High Ca++   Flomax [Tamsulosin] Itching   Codeine Itching   Statins Other (See Comments)    Muscle aches   Zetia [Ezetimibe] Other (See Comments)    Muscle aches   Social History   Socioeconomic History   Marital status: Widowed     Spouse name: Not on file   Number of children: Not on file   Years of education: Not on file   Highest education level: Not on file  Occupational History   Not on file  Tobacco Use   Smoking status: Former    Packs/day: 1.00    Years: 15.00    Total pack years: 15.00    Types: Cigarettes    Quit date: 09/20/1998    Years since quitting: 23.4   Smokeless tobacco: Never  Vaping Use   Vaping Use: Never used  Substance and Sexual Activity   Alcohol use: Not Currently   Drug use: Never   Sexual activity: Not Currently    Birth control/protection: Surgical    Comment: 1st intercourse 76 yo-Fewer than 5 partners, hysterectomy  Other Topics Concern   Not on file  Social History Narrative   Widowed- husband died of Alzheimers in February 23, 2013.   Social Determinants of Health   Financial Resource Strain: Low Risk  (10/08/2021)   Overall Financial Resource Strain (CARDIA)    Difficulty of Paying Living Expenses: Not hard at all  Food Insecurity: No Food Insecurity (10/08/2021)   Hunger Vital Sign    Worried About Running Out of Food in the Last Year: Never true    Ran Out of Food in the Last Year: Never true  Transportation Needs: No Transportation Needs (10/08/2021)   PRAPARE - Hydrologist (Medical): No    Lack of Transportation (Non-Medical): No  Physical Activity: Inactive (10/08/2021)   Exercise Vital Sign    Days of Exercise per Week: 0 days    Minutes of Exercise per Session: 0 min  Stress: No Stress Concern Present (10/08/2021)   [place]    Feeling of Stress : Not at all  Social Connections: Moderately Isolated (10/03/2020)   Social Connection and Isolation Panel [NHANES]    Frequency of Communication with Friends and Family: Twice a week    Frequency of Social Gatherings with Friends and Family: Twice a week    Attends Religious Services: More than 4 times per year    Active Member  of [employer] or Organizations: No    Attends Archivist Meetings: Never    Marital Status: Widowed   Vitals:   02/08/22 1133  BP: 120/70  Pulse: 85  Resp: 16  Temp: 98.7 F (37.1 C)  SpO2: 98%   Body mass index is 30.57 kg/m.  Physical Exam Vitals and nursing note reviewed.  Constitutional:      General: She is not in acute distress.    Appearance: She is well-developed.  HENT:     Head: Normocephalic and atraumatic.     Mouth/Throat:     Mouth: Mucous membranes are moist.     Pharynx: Oropharynx is clear.  Eyes:     Conjunctiva/sclera: Conjunctivae normal.  Cardiovascular:     Rate and Rhythm: Normal rate and regular rhythm.     Pulses:          Dorsalis pedis pulses are 2+ on the right side and 2+ on the left side.     Heart sounds: No murmur heard. Pulmonary:     Effort: Pulmonary effort is normal. No respiratory distress.     Breath sounds: Normal breath sounds.  Abdominal:     Palpations: Abdomen is soft. There is no hepatomegaly or mass.     Tenderness: There is generalized abdominal tenderness. There is no guarding or rebound.  Musculoskeletal:     Right lower leg: 1+ Pitting Edema present.     Left lower leg: 1+ Pitting Edema present.  Lymphadenopathy:     Cervical: No cervical adenopathy.  Skin:    General: Skin is warm.     Findings: No erythema or rash.  Neurological:     General: No focal deficit present.     Mental Status: She is alert and oriented to person, place, and time.     Cranial Nerves: No cranial nerve deficit.     Gait: Gait normal.  Psychiatric:     Comments: Well groomed, good eye contact.   ASSESSMENT AND PLAN:  [name] was seen today for follow-up.  Diagnoses and all orders for this visit:  Abdominal pain, unspecified abdominal location -     Comprehensive metabolic panel; Future -     Lipase; Future -     CBC; Future  Iliac artery aneurysm, right (HCC)  Primary hypertension -     Comprehensive metabolic panel;  Future  Vitamin D deficiency, unspecified -     Comprehensive metabolic panel; Future -     VITAMIN D 25 Hydroxy (Vit-D Deficiency, Fractures); Future  Hypercalcemia  Constipation, unspecified constipation type -     Lipase; Future -     CBC; Future -     TSH; Future  Need for influenza vaccination -     Flu Vaccine QUAD High Dose(Fluad)    Orders Placed This Encounter  Procedures   Flu Vaccine QUAD High Dose(Fluad)   Comprehensive metabolic panel   Lipase   CBC   TSH   VITAMIN D 25 Hydroxy (Vit-D Deficiency, Fractures)    No problem-specific Assessment & Plan notes found for this encounter.   Return in about 4 weeks (around 03/08/2022) for abdominal pain.   Kasie Leccese G. Martin[place], MD  [facility]. [place] office.

## 2022-02-07 ENCOUNTER — Other Ambulatory Visit: Payer: Self-pay | Admitting: Family Medicine

## 2022-02-07 DIAGNOSIS — I10 Essential (primary) hypertension: Secondary | ICD-10-CM

## 2022-02-08 ENCOUNTER — Ambulatory Visit (INDEPENDENT_AMBULATORY_CARE_PROVIDER_SITE_OTHER): Payer: Medicare Other | Admitting: Family Medicine

## 2022-02-08 ENCOUNTER — Encounter: Payer: Self-pay | Admitting: Family Medicine

## 2022-02-08 VITALS — BP 120/70 | HR 85 | Temp 98.7°F | Resp 16 | Ht 67.5 in | Wt 198.1 lb

## 2022-02-08 DIAGNOSIS — I10 Essential (primary) hypertension: Secondary | ICD-10-CM | POA: Diagnosis not present

## 2022-02-08 DIAGNOSIS — K5900 Constipation, unspecified: Secondary | ICD-10-CM

## 2022-02-08 DIAGNOSIS — E559 Vitamin D deficiency, unspecified: Secondary | ICD-10-CM | POA: Diagnosis not present

## 2022-02-08 DIAGNOSIS — Z23 Encounter for immunization: Secondary | ICD-10-CM

## 2022-02-08 DIAGNOSIS — R109 Unspecified abdominal pain: Secondary | ICD-10-CM

## 2022-02-08 DIAGNOSIS — I723 Aneurysm of iliac artery: Secondary | ICD-10-CM | POA: Diagnosis not present

## 2022-02-08 LAB — COMPREHENSIVE METABOLIC PANEL
ALT: 6 U/L (ref 0–35)
AST: 15 U/L (ref 0–37)
Albumin: 4.5 g/dL (ref 3.5–5.2)
Alkaline Phosphatase: 98 U/L (ref 39–117)
BUN: 14 mg/dL (ref 6–23)
CO2: 25 mEq/L (ref 19–32)
Calcium: 10.9 mg/dL — ABNORMAL HIGH (ref 8.4–10.5)
Chloride: 106 mEq/L (ref 96–112)
Creatinine, Ser: 0.91 mg/dL (ref 0.40–1.20)
GFR: 61.17 mL/min (ref >60.00)
Glucose, Bld: 97 mg/dL (ref 70–99)
Potassium: 3.8 mEq/L (ref 3.5–5.1)
Sodium: 142 mEq/L (ref 135–145)
Total Bilirubin: 0.4 mg/dL (ref 0.2–1.2)
Total Protein: 7.2 g/dL (ref 6.0–8.3)

## 2022-02-08 LAB — CBC
HCT: 38.7 % (ref 36.0–46.0)
Hemoglobin: 12.3 g/dL (ref 12.0–15.0)
MCHC: 31.8 g/dL (ref 30.0–36.0)
MCV: 81.1 fl (ref 78.0–100.0)
Platelets: 207 10*3/uL (ref 150.0–400.0)
RBC: 4.77 Mil/uL (ref 3.87–5.11)
RDW: 14.8 % (ref 11.5–15.5)
WBC: 5.5 10*3/uL (ref 4.0–10.5)

## 2022-02-08 LAB — VITAMIN D 25 HYDROXY (VIT D DEFICIENCY, FRACTURES): VITD: 18.02 ng/mL — ABNORMAL LOW (ref 30.00–100.00)

## 2022-02-08 LAB — TSH: TSH: 2.82 u[IU]/mL (ref 0.35–5.50)

## 2022-02-08 LAB — LIPASE: Lipase: 4 U/L — ABNORMAL LOW (ref 11.0–59.0)

## 2022-02-08 MED ORDER — LOSARTAN POTASSIUM 100 MG PO TABS: 100.0000 mg | ORAL_TABLET | Freq: Every day | ORAL | 3 refills | Status: DC

## 2022-02-08 NOTE — Addendum Note (Signed)
Addended by: Rodrigo Ran on: 02/08/2022 08:48 AM   Modules accepted: Orders

## 2022-02-08 NOTE — Patient Instructions (Addendum)
A few things to remember from today's visit:  Abdominal pain, unspecified abdominal location - Plan: Comprehensive metabolic panel, Lipase, CBC  Iliac artery aneurysm, right (HCC)  Primary hypertension - Plan: Comprehensive metabolic panel  Vitamin D deficiency, unspecified - Plan: Comprehensive metabolic panel, VITAMIN D 25 Hydroxy (Vit-D Deficiency, Fractures)  Hypercalcemia  Constipation, unspecified constipation type - Plan: Lipase, CBC, TSH  3. Medication: Start taking Miralax and Bisacodyl at night until you have a good bowel movement then as needed. Benafiber 1 tsp 2 times daily may also help. No changes in rest of your medication. Keep your next appointment with the urologist is on November 29th. If he does not order another abdominal CT,please let me know.  If your symptoms persist, we may need to refer you to a gastroenterologist for a possible earlier colonoscopy.  If you need refills for medications you take chronically, please call your pharmacy. Do not use My Chart to request refills or for acute issues that need immediate attention. If you send a my chart message, it may take a few days to be addressed, specially if I am not in the office.  Please be sure medication list is accurate. If a new problem present, please set up appointment sooner than planned today.

## 2022-02-16 ENCOUNTER — Other Ambulatory Visit: Payer: Self-pay

## 2022-02-16 ENCOUNTER — Inpatient Hospital Stay (HOSPITAL_BASED_OUTPATIENT_CLINIC_OR_DEPARTMENT_OTHER)
Admission: EM | Admit: 2022-02-16 | Discharge: 2022-02-22 | DRG: 496 | Disposition: A | Discharge status: Home / Self Care | Payer: Medicare Other | Attending: Internal Medicine | Admitting: Internal Medicine

## 2022-02-16 ENCOUNTER — Emergency Department (HOSPITAL_BASED_OUTPATIENT_CLINIC_OR_DEPARTMENT_OTHER): Payer: Medicare Other

## 2022-02-16 ENCOUNTER — Emergency Department (HOSPITAL_BASED_OUTPATIENT_CLINIC_OR_DEPARTMENT_OTHER): Payer: Medicare Other | Admitting: Radiology

## 2022-02-16 ENCOUNTER — Encounter (HOSPITAL_BASED_OUTPATIENT_CLINIC_OR_DEPARTMENT_OTHER): Payer: Self-pay

## 2022-02-16 ENCOUNTER — Encounter (HOSPITAL_COMMUNITY): Payer: Self-pay

## 2022-02-16 DIAGNOSIS — J90 Pleural effusion, not elsewhere classified: Secondary | ICD-10-CM | POA: Diagnosis not present

## 2022-02-16 DIAGNOSIS — D630 Anemia in neoplastic disease: Secondary | ICD-10-CM | POA: Diagnosis present

## 2022-02-16 DIAGNOSIS — Z6830 Body mass index (BMI) 30.0-30.9, adult: Secondary | ICD-10-CM | POA: Diagnosis not present

## 2022-02-16 DIAGNOSIS — Z923 Personal history of irradiation: Secondary | ICD-10-CM

## 2022-02-16 DIAGNOSIS — Z83719 Family history of colon polyps, unspecified: Secondary | ICD-10-CM

## 2022-02-16 DIAGNOSIS — Z515 Encounter for palliative care: Secondary | ICD-10-CM | POA: Diagnosis not present

## 2022-02-16 DIAGNOSIS — Z9012 Acquired absence of left breast and nipple: Secondary | ICD-10-CM

## 2022-02-16 DIAGNOSIS — C7951 Secondary malignant neoplasm of bone: Principal | ICD-10-CM

## 2022-02-16 DIAGNOSIS — Z86010 Personal history of colonic polyps: Secondary | ICD-10-CM

## 2022-02-16 DIAGNOSIS — Z820 Family history of epilepsy and other diseases of the nervous system: Secondary | ICD-10-CM

## 2022-02-16 DIAGNOSIS — C7800 Secondary malignant neoplasm of unspecified lung: Secondary | ICD-10-CM | POA: Diagnosis not present

## 2022-02-16 DIAGNOSIS — Z9221 Personal history of antineoplastic chemotherapy: Secondary | ICD-10-CM

## 2022-02-16 DIAGNOSIS — M797 Fibromyalgia: Secondary | ICD-10-CM | POA: Diagnosis present

## 2022-02-16 DIAGNOSIS — Z79899 Other long term (current) drug therapy: Secondary | ICD-10-CM | POA: Diagnosis not present

## 2022-02-16 DIAGNOSIS — Z803 Family history of malignant neoplasm of breast: Secondary | ICD-10-CM

## 2022-02-16 DIAGNOSIS — Z7189 Other specified counseling: Secondary | ICD-10-CM | POA: Diagnosis not present

## 2022-02-16 DIAGNOSIS — I10 Essential (primary) hypertension: Secondary | ICD-10-CM | POA: Diagnosis not present

## 2022-02-16 DIAGNOSIS — E119 Type 2 diabetes mellitus without complications: Secondary | ICD-10-CM | POA: Diagnosis present

## 2022-02-16 DIAGNOSIS — M8458XA Pathological fracture in neoplastic disease, other specified site, initial encounter for fracture: Secondary | ICD-10-CM | POA: Diagnosis not present

## 2022-02-16 DIAGNOSIS — Z853 Personal history of malignant neoplasm of breast: Secondary | ICD-10-CM

## 2022-02-16 DIAGNOSIS — G893 Neoplasm related pain (acute) (chronic): Secondary | ICD-10-CM | POA: Diagnosis not present

## 2022-02-16 DIAGNOSIS — K5730 Diverticulosis of large intestine without perforation or abscess without bleeding: Secondary | ICD-10-CM | POA: Diagnosis not present

## 2022-02-16 DIAGNOSIS — Z87891 Personal history of nicotine dependence: Secondary | ICD-10-CM | POA: Diagnosis not present

## 2022-02-16 DIAGNOSIS — E669 Obesity, unspecified: Secondary | ICD-10-CM | POA: Diagnosis not present

## 2022-02-16 DIAGNOSIS — Z8616 Personal history of COVID-19: Secondary | ICD-10-CM | POA: Diagnosis not present

## 2022-02-16 DIAGNOSIS — C679 Malignant neoplasm of bladder, unspecified: Secondary | ICD-10-CM | POA: Diagnosis present

## 2022-02-16 DIAGNOSIS — Z87442 Personal history of urinary calculi: Secondary | ICD-10-CM

## 2022-02-16 DIAGNOSIS — K219 Gastro-esophageal reflux disease without esophagitis: Secondary | ICD-10-CM | POA: Diagnosis present

## 2022-02-16 DIAGNOSIS — E871 Hypo-osmolality and hyponatremia: Secondary | ICD-10-CM | POA: Diagnosis not present

## 2022-02-16 DIAGNOSIS — R079 Chest pain, unspecified: Secondary | ICD-10-CM | POA: Diagnosis not present

## 2022-02-16 DIAGNOSIS — K5909 Other constipation: Secondary | ICD-10-CM | POA: Diagnosis present

## 2022-02-16 DIAGNOSIS — K8020 Calculus of gallbladder without cholecystitis without obstruction: Secondary | ICD-10-CM | POA: Diagnosis not present

## 2022-02-16 DIAGNOSIS — Z885 Allergy status to narcotic agent status: Secondary | ICD-10-CM

## 2022-02-16 DIAGNOSIS — C799 Secondary malignant neoplasm of unspecified site: Secondary | ICD-10-CM | POA: Diagnosis not present

## 2022-02-16 DIAGNOSIS — R109 Unspecified abdominal pain: Secondary | ICD-10-CM | POA: Diagnosis not present

## 2022-02-16 DIAGNOSIS — Z888 Allergy status to other drugs, medicaments and biological substances status: Secondary | ICD-10-CM

## 2022-02-16 DIAGNOSIS — E785 Hyperlipidemia, unspecified: Secondary | ICD-10-CM | POA: Diagnosis present

## 2022-02-16 DIAGNOSIS — R52 Pain, unspecified: Secondary | ICD-10-CM | POA: Diagnosis not present

## 2022-02-16 DIAGNOSIS — I2510 Atherosclerotic heart disease of native coronary artery without angina pectoris: Secondary | ICD-10-CM | POA: Diagnosis not present

## 2022-02-16 DIAGNOSIS — E213 Hyperparathyroidism, unspecified: Secondary | ICD-10-CM | POA: Diagnosis not present

## 2022-02-16 DIAGNOSIS — Z170 Estrogen receptor positive status [ER+]: Secondary | ICD-10-CM

## 2022-02-16 DIAGNOSIS — R918 Other nonspecific abnormal finding of lung field: Secondary | ICD-10-CM | POA: Diagnosis not present

## 2022-02-16 DIAGNOSIS — R0602 Shortness of breath: Secondary | ICD-10-CM | POA: Diagnosis not present

## 2022-02-16 DIAGNOSIS — C3490 Malignant neoplasm of unspecified part of unspecified bronchus or lung: Secondary | ICD-10-CM | POA: Diagnosis not present

## 2022-02-16 LAB — URINALYSIS, ROUTINE W REFLEX MICROSCOPIC
Bilirubin Urine: NEGATIVE
Glucose, UA: NEGATIVE mg/dL
Hgb urine dipstick: NEGATIVE
Ketones, ur: 15 mg/dL — AB
Leukocytes,Ua: NEGATIVE
Nitrite: NEGATIVE
Specific Gravity, Urine: 1.02 (ref 1.005–1.030)
pH: 6.5 (ref 5.0–8.0)

## 2022-02-16 LAB — CBC
HCT: 39.3 % (ref 36.0–46.0)
Hemoglobin: 12.2 g/dL (ref 12.0–15.0)
MCH: 26 pg (ref 26.0–34.0)
MCHC: 31 g/dL (ref 30.0–36.0)
MCV: 83.6 fL (ref 80.0–100.0)
Platelets: 235 10*3/uL (ref 150–400)
RBC: 4.7 MIL/uL (ref 3.87–5.11)
RDW: 13.4 % (ref 11.5–15.5)
WBC: 5.9 10*3/uL (ref 4.0–10.5)
nRBC: 0 % (ref 0.0–0.2)

## 2022-02-16 LAB — TROPONIN I (HIGH SENSITIVITY)
Troponin I (High Sensitivity): 3 ng/L (ref <18)
Troponin I (High Sensitivity): 3 ng/L (ref <18)

## 2022-02-16 LAB — COMPREHENSIVE METABOLIC PANEL
ALT: <5 U/L (ref 0–44)
AST: 14 U/L — ABNORMAL LOW (ref 15–41)
Albumin: 4.5 g/dL (ref 3.5–5.0)
Alkaline Phosphatase: 113 U/L (ref 38–126)
Anion gap: 15 (ref 5–15)
BUN: 6 mg/dL — ABNORMAL LOW (ref 8–23)
CO2: 23 mmol/L (ref 22–32)
Calcium: 11 mg/dL — ABNORMAL HIGH (ref 8.9–10.3)
Chloride: 104 mmol/L (ref 98–111)
Creatinine, Ser: 0.71 mg/dL (ref 0.44–1.00)
GFR, Estimated: >60 mL/min (ref >60)
Glucose, Bld: 96 mg/dL (ref 70–99)
Potassium: 3.9 mmol/L (ref 3.5–5.1)
Sodium: 142 mmol/L (ref 135–145)
Total Bilirubin: 0.5 mg/dL (ref 0.3–1.2)
Total Protein: 7.3 g/dL (ref 6.5–8.1)

## 2022-02-16 LAB — LIPASE, BLOOD: Lipase: <10 U/L — ABNORMAL LOW (ref 11–51)

## 2022-02-16 MED ORDER — FENTANYL CITRATE PF 50 MCG/ML IJ SOSY
50.0000 ug | PREFILLED_SYRINGE | Freq: Once | INTRAMUSCULAR | Status: AC
  Administered 2022-02-16: 50 ug via INTRAVENOUS
  Filled 2022-02-16: qty 1

## 2022-02-16 MED ORDER — OXYCODONE-ACETAMINOPHEN 5-325 MG PO TABS
1.0000 | ORAL_TABLET | ORAL | Status: DC | PRN
  Administered 2022-02-16 – 2022-02-17 (×2): 1 via ORAL
  Filled 2022-02-16 (×2): qty 1

## 2022-02-16 MED ORDER — HYDROMORPHONE HCL 1 MG/ML IJ SOLN
1.0000 mg | Freq: Once | INTRAMUSCULAR | Status: AC
  Administered 2022-02-16: 1 mg via INTRAVENOUS
  Filled 2022-02-16: qty 1

## 2022-02-16 MED ORDER — IOHEXOL 300 MG/ML  SOLN
100.0000 mL | Freq: Once | INTRAMUSCULAR | Status: AC | PRN
  Administered 2022-02-16: 80 mL via INTRAVENOUS

## 2022-02-16 MED ORDER — HYDROMORPHONE HCL 1 MG/ML IJ SOLN
1.0000 mg | Freq: Once | INTRAMUSCULAR | Status: DC
  Filled 2022-02-16: qty 1

## 2022-02-16 MED ORDER — HYDROMORPHONE HCL 1 MG/ML IJ SOLN
1.0000 mg | INTRAMUSCULAR | Status: DC | PRN
  Administered 2022-02-16 – 2022-02-17 (×3): 1 mg via INTRAVENOUS
  Filled 2022-02-16 (×3): qty 1

## 2022-02-16 MED ORDER — LACTATED RINGERS IV BOLUS
1000.0000 mL | Freq: Once | INTRAVENOUS | Status: AC
  Administered 2022-02-16: 1000 mL via INTRAVENOUS

## 2022-02-16 MED ORDER — DEXAMETHASONE 4 MG PO TABS
4.0000 mg | ORAL_TABLET | Freq: Two times a day (BID) | ORAL | Status: DC
  Administered 2022-02-16 – 2022-02-17 (×3): 4 mg via ORAL
  Filled 2022-02-16 (×4): qty 1

## 2022-02-16 MED ORDER — ONDANSETRON HCL 4 MG/2ML IJ SOLN
4.0000 mg | Freq: Once | INTRAMUSCULAR | Status: AC
  Administered 2022-02-16: 4 mg via INTRAVENOUS
  Filled 2022-02-16: qty 2

## 2022-02-16 NOTE — Plan of Care (Signed)
[facility] will assume care on arrival to accepting facility. Until arrival, care as per EDP. However, [facility] available 24/7 for questions and assistance.  Nursing staff, please page [facility] Admits and Consults ([phone]) as soon as the patient arrives to the hospital.   

## 2022-02-16 NOTE — ED Provider Notes (Signed)
[facility] DEPT Provider Note   CSN: [id number] Arrival date & time: 02/16/22  1403     History  Chief Complaint  Patient presents with   Abdominal Pain    [name] is a 76 y.o. female.  HPI 76 year old female with a history of previous breast cancer but current bladder cancer receiving chemotherapy presents with abdominal pain/lower chest pain.  Is been particularly bad over 3 weeks which she thinks started earlier than that and she thinks is related to the bladder chemotherapy starting in September.  She has not had vomiting but has had constipation and smaller bowel movements.  Denies any specific urinary symptoms besides decreased urine output which is pretty typical whenever she receives specific treatment to her bladder.  She has also been having lower chest/rib pain which hurts with any type of movement or breathing/coughing.  Pain was originally also in her back.  It was bilateral at onset but is primarily in the right side now.  She has been taking 800 mg ibuprofen which relieves the pain but then it comes back. Called her neurologist yesterday and was told to take 5 doses of MiraLAX with water.  She had a bowel movement but not quite to normal.  Home Medications Prior to Admission medications   Medication Sig Start Date End Date Taking? Authorizing Provider  acetaminophen (TYLENOL) 325 MG tablet Take 650 mg by mouth every 6 (six) hours as needed.    [provider]  BIOTIN PO Take 1 tablet by mouth daily.    [provider]  Calcium Citrate-Vitamin D (CALCIUM CITRATE + D3 PO) Take 1 tablet by mouth daily.    [provider]  cetirizine (ZYRTEC) 10 MG tablet Take 10 mg by mouth at bedtime.    [provider]  cyanocobalamin 1000 MCG tablet Take 1,000 mcg by mouth daily. Vitamin b 12    [provider]  furosemide (LASIX) 20 MG tablet Take 1 tablet (20 mg total) by mouth daily. 01/28/22   Martinique, Betty  G, MD  ibuprofen (ADVIL) 200 MG tablet Take 200 mg by mouth every 6 (six) hours as needed.    [provider]  losartan (COZAAR) 100 MG tablet Take 1 tablet (100 mg total) by mouth daily. 02/08/22   Martin[place], Betty G, MD  meclizine (ANTIVERT) 25 MG tablet TAKE 1 TABLET BY MOUTH 3  TIMES DAILY AS NEEDED FOR  DIZZINESS Patient taking differently: Take 25 mg by mouth 3 (three) times daily as needed. TAKE 1 TABLET BY MOUTH 3  TIMES DAILY AS NEEDED FOR  DIZZINESS 10/15/21   Martin[place], Betty G, MD  Menthol, Topical Analgesic, (BIOFREEZE ROLL-ON EX) Apply topically as needed. 2 or 3 x daily    [provider]  pantoprazole (PROTONIX) 40 MG tablet Take 1 tablet (40 mg total) by mouth daily. Patient taking differently: Take 40 mg by mouth at bedtime. 10/15/21   Martin[place], Betty G, MD  potassium chloride SA (KLOR-CON M) 20 MEQ tablet TAKE 1 TABLET BY MOUTH  DAILY Patient taking differently: 20 mEq at bedtime. TAKE 1 TABLET BY MOUTH  DAILY 04/04/21   Martin[place], Betty G, MD  Probiotic Product (ALIGN PO) Take 1 tablet by mouth daily.     [provider]  rosuvastatin (CRESTOR) 10 MG tablet 2 to 3 x week in am Patient taking differently: Take 10 mg by mouth 2 (two) times a week. 2 to 3 x week in am 10/09/21   Martin[place], Betty G,  MD  UNABLE TO FIND Flucinonide usp 0.05 % cream prn for eczema    [provider]      Allergies    Hydrochlorothiazide, Flomax [tamsulosin], Codeine, Statins, and Zetia [ezetimibe]    Review of Systems   Review of Systems  Respiratory:  Negative for shortness of breath (at times, but for the most part no dyspnea).   Cardiovascular:  Positive for chest pain.  Gastrointestinal:  Positive for abdominal pain and constipation. Negative for vomiting.  Genitourinary:  Positive for decreased urine volume. Negative for dysuria.  Musculoskeletal:  Positive for back pain.    Physical Exam Updated Vital Signs BP (!) 150/77   Pulse 65   Temp 98 F (36.7 C) (Oral)    Resp 17   Ht 5' 7.5" (1.715 m)   Wt 89.9 kg   SpO2 98%   BMI 30.58 kg/m  Physical Exam Vitals and nursing note reviewed.  Constitutional:      General: She is not in acute distress.    Appearance: She is well-developed. She is not ill-appearing or diaphoretic.  HENT:     Head: Normocephalic and atraumatic.  Cardiovascular:     Rate and Rhythm: Normal rate and regular rhythm.     Heart sounds: Normal heart sounds.  Pulmonary:     Effort: Pulmonary effort is normal.     Breath sounds: Normal breath sounds.  Abdominal:     Palpations: Abdomen is soft.     Tenderness: There is generalized abdominal tenderness (worst in RUQ and epigastrum. also tender over bilateral but more so right lower ribs (anterior and posterior)).  Skin:    General: Skin is warm and dry.  Neurological:     Mental Status: She is alert.     ED Results / Procedures / Treatments   Labs (all labs ordered are listed, but only abnormal results are displayed) Labs Reviewed  LIPASE, BLOOD - Abnormal; Notable for the following components:      Result Value   Lipase <10 (*)    All other components within normal limits  COMPREHENSIVE METABOLIC PANEL - Abnormal; Notable for the following components:   BUN 6 (*)    Calcium 11.0 (*)    AST 14 (*)    All other components within normal limits  URINALYSIS, ROUTINE W REFLEX MICROSCOPIC - Abnormal; Notable for the following components:   Ketones, ur 15 (*)    Protein, ur TRACE (*)    All other components within normal limits  CBC  TROPONIN I (HIGH SENSITIVITY)  TROPONIN I (HIGH SENSITIVITY)    EKG EKG Interpretation  Date/Time:  Saturday February 16 2022 14:11:27 EDT Ventricular Rate:  93 PR Interval:  140 QRS Duration: 78 QT Interval:  342 QTC Calculation: 425 R Axis:   16 Text Interpretation: Normal sinus rhythm Cannot rule out Anterior infarct , age undetermined Abnormal ECG When compared with ECG of 09-Jan-2021 06:22, Vent. rate has increased Confirmed  by Dorie Rank [phone]) on 02/16/2022 2:22:10 PM  Radiology CT Chest Wo Contrast  Result Date: 02/16/2022 CLINICAL DATA:  Lung nodule, > 27mm.  Dyspnea EXAM: CT CHEST WITHOUT CONTRAST TECHNIQUE: Multidetector CT imaging of the chest was performed following the standard protocol without IV contrast. RADIATION DOSE REDUCTION: This exam was performed according to the departmental dose-optimization program which includes automated exposure control, adjustment of the mA and/or kV according to patient size and/or use of iterative reconstruction technique. COMPARISON:  None Available. FINDINGS: Cardiovascular: Moderate multi-vessel coronary artery calcification. Global  cardiac size is within normal limits. No pericardial effusion. Central pulmonary arteries are of normal caliber. Mild atherosclerotic calcification within the thoracic aorta. No aortic aneurysm. Mediastinum/Nodes: No enlarged mediastinal or axillary lymph nodes. Thyroid gland, trachea, and esophagus demonstrate no significant findings. Lungs/Pleura: The opacity seen on recent chest radiograph corresponds to a lobulated soft tissue mass within the a anterior left upper lobe measuring 2.9 x 4.1 cm at axial image # 43/2, suspicious for a primary bronchogenic neoplasm. The mass abuts the pleural surface but does not clearly transgress into the anterior mediastinum. There are innumerable micro nodules seen distributed randomly throughout the lungs bilaterally which appear new since the visualized lung bases on examination of 11/18/2017 and are suspicious for intra pulmonary miliary metastatic disease. Small bilateral pleural effusions are present. No pneumothorax. No central obstructing lesion. Upper Abdomen: No acute abnormality. Musculoskeletal: There are erosive changes involving the right eighth rib anteriorly and left seventh ribs anterolaterally in keeping with osseous metastatic disease. There are, additionally, subtle lytic lesions seen throughout the  thoracic spine, best appreciated within the T10 and T11 vertebral bodies also suspicious osseous metastatic disease. There is a acute appearing fracture of the right seventh rib laterally with associated subpleural thickening likely representing edema. No associated soft tissue mass or erosion is identified and this may simply represent a posttraumatic fracture. Left mastectomy and axillary lymph node dissection has been performed. IMPRESSION: 1. 4.1 cm left upper lobe pulmonary mass suspicious for a primary bronchogenic neoplasm. Innumerable micro nodules distributed randomly throughout the lungs bilaterally suspicious for intra pulmonary miliary metastatic disease. PET CT examination may be helpful to direct biopsy strategy, if indicated. 2. Osseous metastatic disease with erosive changes involving the right eighth rib anteriorly and left seventh ribs anterolaterally. Additional subtle lytic lesions seen throughout the thoracic spine, best appreciated within the T10 and T11 vertebral bodies also suspicious for osseous metastatic disease. This would be better assessed with contrast enhanced MRI examination if indicated. 3. Acute appearing fracture of the right seventh rib laterally with associated subpleural thickening likely representing edema. No associated soft tissue mass or erosion is identified and this may simply represent a posttraumatic fracture. 4. Moderate multi-vessel coronary artery calcification. 5. Small bilateral pleural effusions. 6. Left mastectomy and axillary lymph node dissection. Aortic Atherosclerosis (ICD10-I70.0). Electronically Signed   By: Fidela Salisbury M.D.   On: 02/16/2022 20:22   DG Chest 1 View  Result Date: 02/16/2022 CLINICAL DATA:  Upper abdominal pain.  Shortness of breath. EXAM: CHEST  1 VIEW COMPARISON:  09/23/2011 FINDINGS: Asymmetric elevation left hemidiaphragm. Right lung is clear. Focal opacity identified in the medial left upper lobe. The cardiopericardial silhouette  is within normal limits for size. The visualized bony structures of the thorax are unremarkable. Telemetry leads overlie the chest. IMPRESSION: Focal masslike opacity in the medial left upper lobe. This could potentially be pneumonia, but imaging features are suspicious for soft tissue mass lesion. CT chest recommended to further evaluate. Electronically Signed   By: Misty Stanley M.D.   On: 02/16/2022 18:44   CT ABDOMEN PELVIS W CONTRAST  Result Date: 02/16/2022 CLINICAL DATA:  Acute abdominal pain. EXAM: CT ABDOMEN AND PELVIS WITH CONTRAST TECHNIQUE: Multidetector CT imaging of the abdomen and pelvis was performed using the standard protocol following bolus administration of intravenous contrast. RADIATION DOSE REDUCTION: This exam was performed according to the departmental dose-optimization program which includes automated exposure control, adjustment of the mA and/or kV according to patient size and/or use of iterative reconstruction  technique. CONTRAST:  76mL OMNIPAQUE IOHEXOL 300 MG/ML  SOLN COMPARISON:  CT abdomen and pelvis 11/24/2020 FINDINGS: Lower chest: There are new innumerable bilateral pulmonary micro nodules throughout both lungs. There are new small bilateral pleural effusions. Hepatobiliary: Gallstones are present. There is no biliary ductal dilatation. The liver is within normal limits. Pancreas: Unremarkable. No pancreatic ductal dilatation or surrounding inflammatory changes. Spleen: Normal in size without focal abnormality. Adrenals/Urinary Tract: Adrenal glands are unremarkable. Kidneys are normal, without renal calculi, focal lesion, or hydronephrosis. Bladder is unremarkable. Stomach/Bowel: There is diffuse colonic diverticulosis without evidence for acute diverticulitis. Appendix is not visualized. No bowel obstruction, wall thickening, focal inflammation or free air. Stomach within normal limits. Vascular/Lymphatic: Aortic atherosclerosis. No enlarged abdominal or pelvic lymph nodes.  Reproductive: Status post hysterectomy. No adnexal masses. Other: No ascites or free air. Surgical clips are seen in the left inguinal region. There is some scarring in the left anterior abdominal wall. Musculoskeletal: There is expansile rib lesion with soft tissue component of the anterolateral right eighth rib fracture. This lesion measures 2.6 x 4.9 x 3.7 cm. There also small lytic lesions in the left anterolateral seventh rib. There also likely small lytic lesions throughout multiple lower thoracic vertebral bodies, particularly at T10 and T11. No acute fractures are seen. IMPRESSION: 1. New innumerable pulmonary micro nodules throughout both lungs worrisome for metastatic disease. 2. New small bilateral pleural effusions. 3. Expansile lytic lesion of the right eighth rib with soft tissue component worrisome for metastatic disease. There are additional lytic lesions in the left seventh rib and lower thoracic spine concerning for metastatic disease. 4. No acute localizing process in the abdomen or pelvis. 5. Cholelithiasis. 6. Colonic diverticulosis. Aortic Atherosclerosis (ICD10-I70.0). Electronically Signed   By: Ronney Asters M.D.   On: 02/16/2022 18:40    Procedures Procedures    Medications Ordered in ED Medications  HYDROmorphone (DILAUDID) injection 1 mg (1 mg Intravenous Not Given 02/16/22 1857)  dexamethasone (DECADRON) tablet 4 mg (4 mg Oral Given 02/16/22 2128)  HYDROmorphone (DILAUDID) injection 1 mg (1 mg Intravenous Given 02/16/22 2123)  oxyCODONE-acetaminophen (PERCOCET/ROXICET) 5-325 MG per tablet 1 tablet (1 tablet Oral Given 02/16/22 2128)  fentaNYL (SUBLIMAZE) injection 50 mcg (50 mcg Intravenous Given 02/16/22 1723)  lactated ringers bolus 1,000 mL (0 mLs Intravenous Stopped 02/16/22 2100)  iohexol (OMNIPAQUE) 300 MG/ML solution 100 mL (80 mLs Intravenous Contrast Given 02/16/22 1807)  HYDROmorphone (DILAUDID) injection 1 mg (1 mg Intravenous Given 02/16/22 1905)  ondansetron  (ZOFRAN) injection 4 mg (4 mg Intravenous Given 02/16/22 2122)    ED Course/ Medical Decision Making/ A&P                           Medical Decision Making Amount and/or Complexity of Data Reviewed External Data Reviewed: notes. Labs: ordered.    Details: Hypercalcemia is stable and chronic compared to old labs.  No leukocytosis.  Normal troponin. Radiology: ordered and independent interpretation performed.    Details: Chest x-ray with lung mass.  CT abdomen with rib fracture and other metastases.  CT chest with lung carcinoma. ECG/medicine tests: independent interpretation performed.    Details: No acute ischemia.  Risk Prescription drug management. Decision regarding hospitalization.   Ultimately, patient's pain seems to be coming from metastatic cancer, primarily in the bones which would explain her rib pain, back pain.  Seems to be coming from lung cancer.  She used to have breast cancer though that seems resolved.  Probably this is not coming from the bladder cancer given the lung mass.  Overall she has been given multiple doses of IV pain meds and while she is better now, I am worried about her pain control.  I discussed case with oncology, Dr. Alvy Bimler, at this point recommends Decadron 4 mg twice daily to help with metastasis bone pain as well as admission to the hospitalist service for expedited work-up, biopsy, etc.  Discussed case with Dr. Marlowe Sax for admission.        Final Clinical Impression(s) / ED Diagnoses Final diagnoses:  Metastatic cancer to bone [facility])    Rx / DC Orders ED Discharge Orders     None         Sherwood Gambler, MD 02/16/22 2318

## 2022-02-16 NOTE — ED Triage Notes (Signed)
Patient here POV from Home.  Endorses Bilateral Upper ABD Pain for approximately 3-4 Weeks. Associated with Some Upper Back Pain and SOB. Also N/V.  Possible Constipation.   NAD Noted during Triage. A&Ox4. GCS 15. BIB Wheelchair.

## 2022-02-17 ENCOUNTER — Inpatient Hospital Stay (HOSPITAL_COMMUNITY): Payer: Medicare Other

## 2022-02-17 DIAGNOSIS — Z0389 Encounter for observation for other suspected diseases and conditions ruled out: Secondary | ICD-10-CM | POA: Diagnosis not present

## 2022-02-17 DIAGNOSIS — C7951 Secondary malignant neoplasm of bone: Secondary | ICD-10-CM | POA: Diagnosis not present

## 2022-02-17 DIAGNOSIS — M797 Fibromyalgia: Secondary | ICD-10-CM | POA: Diagnosis present

## 2022-02-17 DIAGNOSIS — E119 Type 2 diabetes mellitus without complications: Secondary | ICD-10-CM | POA: Diagnosis present

## 2022-02-17 DIAGNOSIS — Z79899 Other long term (current) drug therapy: Secondary | ICD-10-CM | POA: Diagnosis not present

## 2022-02-17 DIAGNOSIS — Z6830 Body mass index (BMI) 30.0-30.9, adult: Secondary | ICD-10-CM | POA: Diagnosis not present

## 2022-02-17 DIAGNOSIS — C679 Malignant neoplasm of bladder, unspecified: Secondary | ICD-10-CM | POA: Diagnosis not present

## 2022-02-17 DIAGNOSIS — C7989 Secondary malignant neoplasm of other specified sites: Secondary | ICD-10-CM | POA: Diagnosis not present

## 2022-02-17 DIAGNOSIS — E213 Hyperparathyroidism, unspecified: Secondary | ICD-10-CM | POA: Diagnosis present

## 2022-02-17 DIAGNOSIS — Z8616 Personal history of COVID-19: Secondary | ICD-10-CM | POA: Diagnosis not present

## 2022-02-17 DIAGNOSIS — G9389 Other specified disorders of brain: Secondary | ICD-10-CM | POA: Diagnosis not present

## 2022-02-17 DIAGNOSIS — C7800 Secondary malignant neoplasm of unspecified lung: Secondary | ICD-10-CM | POA: Diagnosis present

## 2022-02-17 DIAGNOSIS — Z923 Personal history of irradiation: Secondary | ICD-10-CM | POA: Diagnosis not present

## 2022-02-17 DIAGNOSIS — Z7189 Other specified counseling: Secondary | ICD-10-CM | POA: Diagnosis not present

## 2022-02-17 DIAGNOSIS — Z9012 Acquired absence of left breast and nipple: Secondary | ICD-10-CM | POA: Diagnosis not present

## 2022-02-17 DIAGNOSIS — E669 Obesity, unspecified: Secondary | ICD-10-CM | POA: Diagnosis present

## 2022-02-17 DIAGNOSIS — C799 Secondary malignant neoplasm of unspecified site: Secondary | ICD-10-CM | POA: Diagnosis not present

## 2022-02-17 DIAGNOSIS — D0502 Lobular carcinoma in situ of left breast: Secondary | ICD-10-CM | POA: Diagnosis not present

## 2022-02-17 DIAGNOSIS — I2510 Atherosclerotic heart disease of native coronary artery without angina pectoris: Secondary | ICD-10-CM | POA: Diagnosis present

## 2022-02-17 DIAGNOSIS — Z170 Estrogen receptor positive status [ER+]: Secondary | ICD-10-CM | POA: Diagnosis not present

## 2022-02-17 DIAGNOSIS — Z8551 Personal history of malignant neoplasm of bladder: Secondary | ICD-10-CM | POA: Diagnosis not present

## 2022-02-17 DIAGNOSIS — I10 Essential (primary) hypertension: Secondary | ICD-10-CM | POA: Diagnosis present

## 2022-02-17 DIAGNOSIS — Z87891 Personal history of nicotine dependence: Secondary | ICD-10-CM | POA: Diagnosis not present

## 2022-02-17 DIAGNOSIS — Z515 Encounter for palliative care: Secondary | ICD-10-CM | POA: Diagnosis not present

## 2022-02-17 DIAGNOSIS — E785 Hyperlipidemia, unspecified: Secondary | ICD-10-CM | POA: Diagnosis present

## 2022-02-17 DIAGNOSIS — M799 Soft tissue disorder, unspecified: Secondary | ICD-10-CM | POA: Diagnosis not present

## 2022-02-17 DIAGNOSIS — Z510 Encounter for antineoplastic radiation therapy: Secondary | ICD-10-CM | POA: Diagnosis not present

## 2022-02-17 DIAGNOSIS — Z9221 Personal history of antineoplastic chemotherapy: Secondary | ICD-10-CM | POA: Diagnosis not present

## 2022-02-17 DIAGNOSIS — E871 Hypo-osmolality and hyponatremia: Secondary | ICD-10-CM | POA: Diagnosis not present

## 2022-02-17 DIAGNOSIS — D630 Anemia in neoplastic disease: Secondary | ICD-10-CM | POA: Diagnosis present

## 2022-02-17 DIAGNOSIS — M8458XA Pathological fracture in neoplastic disease, other specified site, initial encounter for fracture: Secondary | ICD-10-CM | POA: Diagnosis present

## 2022-02-17 DIAGNOSIS — Z853 Personal history of malignant neoplasm of breast: Secondary | ICD-10-CM | POA: Diagnosis not present

## 2022-02-17 DIAGNOSIS — R52 Pain, unspecified: Secondary | ICD-10-CM | POA: Diagnosis not present

## 2022-02-17 DIAGNOSIS — K219 Gastro-esophageal reflux disease without esophagitis: Secondary | ICD-10-CM | POA: Diagnosis present

## 2022-02-17 DIAGNOSIS — G893 Neoplasm related pain (acute) (chronic): Secondary | ICD-10-CM | POA: Diagnosis present

## 2022-02-17 DIAGNOSIS — Z452 Encounter for adjustment and management of vascular access device: Secondary | ICD-10-CM | POA: Diagnosis not present

## 2022-02-17 DIAGNOSIS — C50912 Malignant neoplasm of unspecified site of left female breast: Secondary | ICD-10-CM | POA: Diagnosis not present

## 2022-02-17 LAB — CBC
HCT: 32.6 % — ABNORMAL LOW (ref 36.0–46.0)
Hemoglobin: 10.1 g/dL — ABNORMAL LOW (ref 12.0–15.0)
MCH: 26.2 pg (ref 26.0–34.0)
MCHC: 31 g/dL (ref 30.0–36.0)
MCV: 84.7 fL (ref 80.0–100.0)
Platelets: 210 10*3/uL (ref 150–400)
RBC: 3.85 MIL/uL — ABNORMAL LOW (ref 3.87–5.11)
RDW: 13.4 % (ref 11.5–15.5)
WBC: 4.9 10*3/uL (ref 4.0–10.5)
nRBC: 0 % (ref 0.0–0.2)

## 2022-02-17 LAB — BASIC METABOLIC PANEL
Anion gap: 4 — ABNORMAL LOW (ref 5–15)
BUN: 9 mg/dL (ref 8–23)
CO2: 28 mmol/L (ref 22–32)
Calcium: 9.8 mg/dL (ref 8.9–10.3)
Chloride: 105 mmol/L (ref 98–111)
Creatinine, Ser: 0.61 mg/dL (ref 0.44–1.00)
GFR, Estimated: >60 mL/min (ref >60)
Glucose, Bld: 114 mg/dL — ABNORMAL HIGH (ref 70–99)
Potassium: 3.8 mmol/L (ref 3.5–5.1)
Sodium: 137 mmol/L (ref 135–145)

## 2022-02-17 LAB — MAGNESIUM: Magnesium: 2 mg/dL (ref 1.7–2.4)

## 2022-02-17 LAB — PHOSPHORUS: Phosphorus: 3 mg/dL (ref 2.5–4.6)

## 2022-02-17 MED ORDER — LACTATED RINGERS IV SOLN: INTRAVENOUS | Status: AC

## 2022-02-17 MED ORDER — GADOBUTROL 1 MMOL/ML IV SOLN
9.0000 mL | Freq: Once | INTRAVENOUS | Status: AC | PRN
  Administered 2022-02-17: 9 mL via INTRAVENOUS

## 2022-02-17 MED ORDER — ACETAMINOPHEN 325 MG PO TABS: 650.0000 mg | ORAL_TABLET | Freq: Four times a day (QID) | ORAL | Status: DC | PRN

## 2022-02-17 MED ORDER — SENNOSIDES-DOCUSATE SODIUM 8.6-50 MG PO TABS
1.0000 | ORAL_TABLET | Freq: Every day | ORAL | Status: DC
  Administered 2022-02-17 – 2022-02-21 (×6): 1 via ORAL
  Filled 2022-02-17 (×6): qty 1

## 2022-02-17 MED ORDER — LIP MEDEX EX OINT
1.0000 | TOPICAL_OINTMENT | CUTANEOUS | Status: DC | PRN
  Administered 2022-02-17: 1 via TOPICAL
  Filled 2022-02-17: qty 7

## 2022-02-17 MED ORDER — POLYETHYLENE GLYCOL 3350 17 G PO PACK
17.0000 g | PACK | Freq: Every day | ORAL | Status: DC
  Administered 2022-02-19 – 2022-02-22 (×2): 17 g via ORAL
  Filled 2022-02-17 (×2): qty 1

## 2022-02-17 MED ORDER — OXYCODONE HCL 5 MG PO TABS
10.0000 mg | ORAL_TABLET | ORAL | Status: DC | PRN
  Administered 2022-02-17 – 2022-02-18 (×4): 10 mg via ORAL
  Filled 2022-02-17 (×4): qty 2

## 2022-02-17 MED ORDER — HYDROMORPHONE HCL 1 MG/ML IJ SOLN
2.0000 mg | INTRAMUSCULAR | Status: DC | PRN
  Administered 2022-02-17 – 2022-02-18 (×2): 2 mg via INTRAVENOUS
  Filled 2022-02-17 (×3): qty 2

## 2022-02-17 MED ORDER — ALUM & MAG HYDROXIDE-SIMETH 200-200-20 MG/5ML PO SUSP
30.0000 mL | ORAL | Status: DC | PRN
  Administered 2022-02-17 – 2022-02-21 (×5): 30 mL via ORAL
  Filled 2022-02-17 (×5): qty 30

## 2022-02-17 MED ORDER — MELATONIN 5 MG PO TABS
5.0000 mg | ORAL_TABLET | Freq: Every evening | ORAL | Status: DC | PRN
  Administered 2022-02-17 – 2022-02-21 (×5): 5 mg via ORAL
  Filled 2022-02-17 (×5): qty 1

## 2022-02-17 MED ORDER — POLYETHYLENE GLYCOL 3350 17 G PO PACK: 17.0000 g | PACK | Freq: Every day | ORAL | Status: DC | PRN

## 2022-02-17 MED ORDER — OXYCODONE HCL 5 MG PO TABS
5.0000 mg | ORAL_TABLET | Freq: Four times a day (QID) | ORAL | Status: DC | PRN
  Administered 2022-02-17 (×2): 5 mg via ORAL
  Filled 2022-02-17 (×2): qty 1

## 2022-02-17 MED ORDER — PROCHLORPERAZINE EDISYLATE 10 MG/2ML IJ SOLN
5.0000 mg | Freq: Four times a day (QID) | INTRAMUSCULAR | Status: DC | PRN
  Administered 2022-02-17 – 2022-02-22 (×5): 5 mg via INTRAVENOUS
  Filled 2022-02-17 (×5): qty 2

## 2022-02-17 MED ORDER — ENOXAPARIN SODIUM 40 MG/0.4ML IJ SOSY
40.0000 mg | PREFILLED_SYRINGE | INTRAMUSCULAR | Status: DC
  Administered 2022-02-19 – 2022-02-22 (×3): 40 mg via SUBCUTANEOUS
  Filled 2022-02-17 (×4): qty 0.4

## 2022-02-17 MED ORDER — LORAZEPAM 0.5 MG PO TABS
0.5000 mg | ORAL_TABLET | Freq: Two times a day (BID) | ORAL | Status: DC | PRN
  Administered 2022-02-17: 0.5 mg via ORAL
  Filled 2022-02-17: qty 1

## 2022-02-17 MED ORDER — MECLIZINE HCL 25 MG PO TABS: 25.0000 mg | ORAL_TABLET | Freq: Three times a day (TID) | ORAL | Status: DC | PRN

## 2022-02-17 MED ORDER — ENOXAPARIN SODIUM 40 MG/0.4ML IJ SOSY
40.0000 mg | PREFILLED_SYRINGE | INTRAMUSCULAR | Status: DC
  Administered 2022-02-17: 40 mg via SUBCUTANEOUS
  Filled 2022-02-17: qty 0.4
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
## 2022-02-17 NOTE — Progress Notes (Signed)
  Progress Note   Patient: [name] JOI:[id number] DOB: [birth year] DOA: 02/16/2022     0 DOS: the patient was seen and examined on 02/17/2022   Brief hospital course: 76 year old female with history of CAD, hypertension, hyperlipidemia, remote history of breast cancer and currently being treated for bladder cancer presenting with abdominal pain/back pain/rib pain.  CT showing primary lung mass/pulmonary metastatic disease and also bone mets and rib fracture.  Dr. Alvy Bimler consulted and recommended admission for pain control and Decadron.  Planning on doing biopsy and further work-up while inpatient.  Assessment and Plan: Present on Admission:  Metastatic disease ([place])   Principal Problem:   Metastatic disease ([place])   Abdominal pain likely secondary to metastatic disease Supportive care Pain control, bowel regimen Gentle IV fluid hydration  Intractable nausea vomiting -IVF and anti-emetics   History of breast cancer, currently being treated for bladder cancer  CT evidence of primary lung mass, pulmonary metastatic disease, bone mets and ribs fracture.   EDP discussed the case with Dr. Alvy Bimler, hematology oncology, who recommended admission for pain control and Decadron.   Plan for biopsy and further work-up while inpatient.   Hyperlipidemia On Crestor   GERD Resume home PPI   Essential hypertension BPs are soft Hold off home oral antibiotic Monitor vital signs.         Subjective: Reports having episode of nausea and vomiting this morning.  Feels unwell.  Physical Exam: Vitals:   02/16/22 2328 02/17/22 0342 02/17/22 0736 02/17/22 1151  BP: 127/69 (!) 130/50 120/68 (!) 122/57  Pulse: 67 66 62 (!) 57  Resp: 18 18 18 16   Temp: 97.9 F (36.6 C) 97.9 F (36.6 C) 97.9 F (36.6 C) 98.2 F (36.8 C)  TempSrc: Oral Oral Oral Oral  SpO2: 96% 95% 95% 92%  Weight:      Height:      Physical Exam Constitutional:      Appearance: She is well-developed.  HENT:      Head: Normocephalic and atraumatic.  Eyes:     Extraocular Movements: Extraocular movements intact.     Pupils: Pupils are equal, round, and reactive to light.  Cardiovascular:     Rate and Rhythm: Normal rate and regular rhythm.  Pulmonary:     Effort: Pulmonary effort is normal.     Breath sounds: Normal breath sounds.  Abdominal:     General: Abdomen is flat. Bowel sounds are normal.     Palpations: Abdomen is soft.     Tenderness: There is generalized abdominal tenderness.  Skin:    General: Skin is warm.     Capillary Refill: Capillary refill takes less than 2 seconds.  Neurological:     General: No focal deficit present.     Mental Status: She is alert.     Data Reviewed:  There are no new results to review at this time.   Disposition: Status is: Observation The patient remains OBS appropriate and will d/c before 2 midnights.  Planned Discharge Destination: Home    Time spent: 15 minutes  Author: Cristela Felt, MD 02/17/2022 1:03 PM  For on call review [email or web address].

## 2022-02-17 NOTE — H&P (Signed)
History and Physical  [name] DOB: 1945/11/24 DOA: 02/16/2022  Referring physician: Accepted by Dr. Marlowe Sax [facility], hospitalist service. PCP: Martinique, Betty G, MD  Outpatient Specialists: Medical oncology. Patient coming from: Home through [facility] ED.  Chief Complaint: Abdominal pain.  HPI: [name] is a 76 y.o. female with medical history significant for coronary artery disease, essential hypertension, hyperlipidemia, history of breast cancer, currently being treated for bladder cancer who initially presented to [facility] ED with complaints of abdominal pain, back pain and ribs pain.  Work-up in the ED revealed CT evidence of primary lung mass, pulmonary metastatic disease, bone mets and ribs fracture.  EDP discussed the case with Dr. Alvy Bimler, hematology oncology, who recommended admission for pain control and Decadron.  Plan for biopsy and further work-up while inpatient.  The patient was admitted by [facility], Dr. Marlowe Sax, and transferred to [facility] telemetry unit as observation status.  At the time of this visit, the patient's pain is well controlled with IV Dilaudid.  No nausea.  No anginal symptoms.  ED Course: Tmax 98.4.  BP 130/50, pulse 63, respiratory 18, O2 saturation 95% on room air.  CBC and CMP essentially unremarkable.  Review of Systems: Review of systems as noted in the HPI. All other systems reviewed and are negative.   Past Medical History:  Diagnosis Date   Bilateral lower extremity edema    per pt left ankle greater than right weras left leg compression hose prn   Bladder cancer [facility]) 2021   urologist-- dr Milford Cage--- first dx 2021 s/p  TURBT 's with recurrences   CAD (coronary artery disease)    cardiac cath 01/ 2005 by dr Terrence Dupont,  non-obstructive cad;  nuclear study 07-31-2016  normal with no evidence ischemia,nuclear ef 52%   Diverticulosis of colon    Eczema    Fibromyalgia    GERD (gastroesophageal reflux disease)     Hemorrhoids    Hiatal hernia    History of adenomatous polyp of colon    History of cancer chemotherapy 2000   left breast cancer   History of COVID-19 2022   mild all symptoms resolved   History of diverticulitis of colon    History of gastritis    History of kidney stones    History of left breast cancer 2000   dx 2000 and s/p left mastectomy w/ node dissection;  completed chemo and radiation therapy same year 2000 and completed anti-estrogen 2005;  per pt released from oncologist 2006 and no recurrence   Hyperlipidemia    Hypertension    followed by pcp   Nephrolithiasis    per CT 11-24-2020 bilateral tiny non-obstructive stones   Personal history of radiation therapy 2000   left breast cancer   Wears denture upper    Wears glasses    Past Surgical History:  Procedure Laterality Date   ABDOMINAL HYSTERECTOMY  1978   AND LYSIS ADHESIONS /  APPENDECTOMY  (still has ovaries)   ANTERIOR AND POSTERIOR REPAIR WITH SACROSPINOUS FIXATION  07-11-2008  @[facility]    AND ENTEROCELE REPAIR/ UTEROSACRAL COLPOSUSPENSION   BREAST REDUCTION SURGERY Right 2000   CARDIAC CATHETERIZATION  05-10-2003   dr Terrence Dupont   mild non-obstructive cad,  ef 55-60%   CATARACT EXTRACTION W/ INTRAOCULAR LENS  IMPLANT, BILATERAL  2015   COLONOSCOPY  01/2018   by dr Collene Mares   CYST EXCISION  02-28-2020  @[facility]    right axilla   CYSTOSCOPY W/ RETROGRADES Bilateral 03/21/2020   Procedure: CYSTOSCOPY WITH  RETROGRADE PYELOGRAM;  Surgeon: Remi Haggard, MD;  Location: [facility];  Service: Urology;  Laterality: Bilateral;   CYSTOSCOPY W/ RETROGRADES Bilateral 01/09/2021   Procedure: CYSTOSCOPY WITH RETROGRADE PYELOGRAM;  Surgeon: Remi Haggard, MD;  Location: [facility];  Service: Urology;  Laterality: Bilateral;   CYSTOSCOPY W/ RETROGRADES Bilateral 07/31/2021   Procedure: CYSTOSCOPY WITH RETROGRADE PYELOGRAM;  Surgeon: Remi Haggard, MD;  Location: [facility];   Service: Urology;  Laterality: Bilateral;   CYSTOSCOPY W/ RETROGRADES Bilateral 11/20/2021   Procedure: CYSTOSCOPY WITH RETROGRADE PYELOGRAM;  Surgeon: Remi Haggard, MD;  Location: [facility];  Service: Urology;  Laterality: Bilateral;   INGUINAL HERNIA REPAIR  09/27/2011   Procedure: HERNIA REPAIR INGUINAL ADULT;  Surgeon: Gwenyth Ober, MD;  Location: [facility];  Service: General;  Laterality: Right;   KNEE ARTHROSCOPY Left 1986   MASTECTOMY WITH AXILLARY LYMPH NODE DISSECTION Left 2000   W/  RECONSTRUCTION   NASAL SINUS SURGERY  1980s   ROTATOR CUFF REPAIR Right 1985   TONSILLECTOMY     child and adenoids removed   TRANSURETHRAL RESECTION OF BLADDER TUMOR N/A 03/21/2020   Procedure: TRANSURETHRAL RESECTION OF BLADDER TUMOR (TURBT);  Surgeon: Remi Haggard, MD;  Location: [facility];  Service: Urology;  Laterality: N/A;  [place] TUMOR N/A 01/09/2021   Procedure: TRANSURETHRAL RESECTION OF BLADDER TUMOR (TURBT);  Surgeon: Remi Haggard, MD;  Location: [facility];  Service: Urology;  Laterality: N/A;   TRANSURETHRAL RESECTION OF BLADDER TUMOR WITH MITOMYCIN-C N/A 07/31/2021   Procedure: TRANSURETHRAL RESECTION OF BLADDER TUMOR WITH GEMCITABINE;  Surgeon: Remi Haggard, MD;  Location: [facility];  Service: Urology;  Laterality: N/A;   TRANSURETHRAL RESECTION OF BLADDER TUMOR WITH MITOMYCIN-C N/A 11/20/2021   Procedure: TRANSURETHRAL RESECTION OF BLADDER TUMOR WITH POSTOPERATIVE  GEMCITABINE;  Surgeon: Remi Haggard, MD;  Location: [facility];  Service: Urology;  Laterality: N/A;  30 MINS    Social History:  reports that she quit smoking about 23 years ago. Her smoking use included cigarettes. She has a 15.00 pack-year smoking history. She has never used smokeless tobacco. She reports that she does not currently use alcohol. She reports that she does not use  drugs.   Allergies  Allergen Reactions   Hydrochlorothiazide Other (See Comments)    High Ca++   Flomax [Tamsulosin] Itching   Codeine Itching   Statins Other (See Comments)    Muscle aches   Zetia [Ezetimibe] Other (See Comments)    Muscle aches    Family History  Problem Relation Age of Onset   Alzheimer's disease Mother    Breast cancer Sister 83   Colon polyps Sister    Cancer Son        prostate   Anesthesia problems Neg Hx       Prior to Admission medications   Medication Sig Start Date End Date Taking? Authorizing Provider  acetaminophen (TYLENOL) 325 MG tablet Take 650 mg by mouth every 6 (six) hours as needed.    [provider]  BIOTIN PO Take 1 tablet by mouth daily.    [provider]  Calcium Citrate-Vitamin D (CALCIUM CITRATE + D3 PO) Take 1 tablet by mouth daily.    [provider]  cetirizine (ZYRTEC) 10 MG tablet Take 10 mg by mouth at bedtime.    [provider]  cyanocobalamin 1000  MCG tablet Take 1,000 mcg by mouth daily. Vitamin b 12    [provider]  furosemide (LASIX) 20 MG tablet Take 1 tablet (20 mg total) by mouth daily. 01/28/22   Martinique, Betty G, MD  ibuprofen (ADVIL) 200 MG tablet Take 200 mg by mouth every 6 (six) hours as needed.    [provider]  losartan (COZAAR) 100 MG tablet Take 1 tablet (100 mg total) by mouth daily. 02/08/22   Martinique, Betty G, MD  meclizine (ANTIVERT) 25 MG tablet TAKE 1 TABLET BY MOUTH 3  TIMES DAILY AS NEEDED FOR  DIZZINESS Patient taking differently: Take 25 mg by mouth 3 (three) times daily as needed. TAKE 1 TABLET BY MOUTH 3  TIMES DAILY AS NEEDED FOR  DIZZINESS 10/15/21   Martinique, Betty G, MD  Menthol, Topical Analgesic, (BIOFREEZE ROLL-ON EX) Apply topically as needed. 2 or 3 x daily    [provider]  pantoprazole (PROTONIX) 40 MG tablet Take 1 tablet (40 mg total) by mouth daily. Patient taking differently: Take 40 mg by mouth at bedtime. 10/15/21    Martinique, Betty G, MD  potassium chloride SA (KLOR-CON M) 20 MEQ tablet TAKE 1 TABLET BY MOUTH  DAILY Patient taking differently: 20 mEq at bedtime. TAKE 1 TABLET BY MOUTH  DAILY 04/04/21   [place], Betty G, MD  Probiotic Product (ALIGN PO) Take 1 tablet by mouth daily.     [provider]  rosuvastatin (CRESTOR) 10 MG tablet 2 to 3 x week in am Patient taking differently: Take 10 mg by mouth 2 (two) times a week. 2 to 3 x week in am 10/09/21   Martin[place], Betty G, MD  UNABLE TO FIND Flucinonide usp 0.05 % cream prn for eczema    [provider]    Physical Exam: BP 127/69 (BP Location: Right Arm)   Pulse 67   Temp 97.9 F (36.6 C) (Oral)   Resp 18   Ht 5' 7.5" (1.715 m)   Wt 89.9 kg   SpO2 96%   BMI 30.58 kg/m   General: 76 y.o. year-old female well developed well nourished in no acute distress.  Alert and oriented x3. Cardiovascular: Regular rate and rhythm with no rubs or gallops.  No thyromegaly or JVD noted.  No lower extremity edema. 2/4 pulses in all 4 extremities. Respiratory: Clear to auscultation with no wheezes or rales. Good inspiratory effort. Abdomen: Soft, tender with palpation.  Bowel sounds present. Muskuloskeletal: No cyanosis, clubbing or edema noted bilaterally Neuro: CN II-XII intact, strength, sensation, reflexes Skin: No ulcerative lesions noted or rashes Psychiatry: Judgement and insight appear normal. Mood is appropriate for condition and setting          Labs on Admission:  Basic Metabolic Panel: Recent Labs  Lab 02/16/22 1418  NA 142  K 3.9  CL 104  CO2 23  GLUCOSE 96  BUN 6*  CREATININE 0.71  CALCIUM 11.0*   Liver Function Tests: Recent Labs  Lab 02/16/22 1418  AST 14*  ALT <5  ALKPHOS 113  BILITOT 0.5  PROT 7.3  ALBUMIN 4.5   Recent Labs  Lab 02/16/22 1418  LIPASE <10*   No results for input(s): "AMMONIA" in the last 168 hours. CBC: Recent Labs  Lab 02/16/22 1418  WBC 5.9  HGB 12.2  HCT 39.3  MCV 83.6  PLT  235   Cardiac Enzymes: No results for input(s): "CKTOTAL", "CKMB", "CKMBINDEX", "TROPONINI" in the last 168 hours.  BNP (last 3  results) No results for input(s): "BNP" in the last 8760 hours.  ProBNP (last 3 results) No results for input(s): "PROBNP" in the last 8760 hours.  CBG: No results for input(s): "GLUCAP" in the last 168 hours.  Radiological Exams on Admission: CT Chest Wo Contrast  Result Date: 02/16/2022 CLINICAL DATA:  Lung nodule, > 50mm.  Dyspnea EXAM: CT CHEST WITHOUT CONTRAST TECHNIQUE: Multidetector CT imaging of the chest was performed following the standard protocol without IV contrast. RADIATION DOSE REDUCTION: This exam was performed according to the departmental dose-optimization program which includes automated exposure control, adjustment of the mA and/or kV according to patient size and/or use of iterative reconstruction technique. COMPARISON:  None Available. FINDINGS: Cardiovascular: Moderate multi-vessel coronary artery calcification. Global cardiac size is within normal limits. No pericardial effusion. Central pulmonary arteries are of normal caliber. Mild atherosclerotic calcification within the thoracic aorta. No aortic aneurysm. Mediastinum/Nodes: No enlarged mediastinal or axillary lymph nodes. Thyroid gland, trachea, and esophagus demonstrate no significant findings. Lungs/Pleura: The opacity seen on recent chest radiograph corresponds to a lobulated soft tissue mass within the a anterior left upper lobe measuring 2.9 x 4.1 cm at axial image # 43/2, suspicious for a primary bronchogenic neoplasm. The mass abuts the pleural surface but does not clearly transgress into the anterior mediastinum. There are innumerable micro nodules seen distributed randomly throughout the lungs bilaterally which appear new since the visualized lung bases on examination of 11/18/2017 and are suspicious for intra pulmonary miliary metastatic disease. Small bilateral pleural effusions are  present. No pneumothorax. No central obstructing lesion. Upper Abdomen: No acute abnormality. Musculoskeletal: There are erosive changes involving the right eighth rib anteriorly and left seventh ribs anterolaterally in keeping with osseous metastatic disease. There are, additionally, subtle lytic lesions seen throughout the thoracic spine, best appreciated within the T10 and T11 vertebral bodies also suspicious osseous metastatic disease. There is a acute appearing fracture of the right seventh rib laterally with associated subpleural thickening likely representing edema. No associated soft tissue mass or erosion is identified and this may simply represent a posttraumatic fracture. Left mastectomy and axillary lymph node dissection has been performed. IMPRESSION: 1. 4.1 cm left upper lobe pulmonary mass suspicious for a primary bronchogenic neoplasm. Innumerable micro nodules distributed randomly throughout the lungs bilaterally suspicious for intra pulmonary miliary metastatic disease. PET CT examination may be helpful to direct biopsy strategy, if indicated. 2. Osseous metastatic disease with erosive changes involving the right eighth rib anteriorly and left seventh ribs anterolaterally. Additional subtle lytic lesions seen throughout the thoracic spine, best appreciated within the T10 and T11 vertebral bodies also suspicious for osseous metastatic disease. This would be better assessed with contrast enhanced MRI examination if indicated. 3. Acute appearing fracture of the right seventh rib laterally with associated subpleural thickening likely representing edema. No associated soft tissue mass or erosion is identified and this may simply represent a posttraumatic fracture. 4. Moderate multi-vessel coronary artery calcification. 5. Small bilateral pleural effusions. 6. Left mastectomy and axillary lymph node dissection. Aortic Atherosclerosis (ICD10-I70.0). Electronically Signed   By: Fidela Salisbury M.D.   On:  02/16/2022 20:22   DG Chest 1 View  Result Date: 02/16/2022 CLINICAL DATA:  Upper abdominal pain.  Shortness of breath. EXAM: CHEST  1 VIEW COMPARISON:  09/23/2011 FINDINGS: Asymmetric elevation left hemidiaphragm. Right lung is clear. Focal opacity identified in the medial left upper lobe. The cardiopericardial silhouette is within normal limits for size. The visualized bony structures of the thorax are unremarkable. Telemetry  leads overlie the chest. IMPRESSION: Focal masslike opacity in the medial left upper lobe. This could potentially be pneumonia, but imaging features are suspicious for soft tissue mass lesion. CT chest recommended to further evaluate. Electronically Signed   By: Misty Stanley M.D.   On: 02/16/2022 18:44   CT ABDOMEN PELVIS W CONTRAST  Result Date: 02/16/2022 CLINICAL DATA:  Acute abdominal pain. EXAM: CT ABDOMEN AND PELVIS WITH CONTRAST TECHNIQUE: Multidetector CT imaging of the abdomen and pelvis was performed using the standard protocol following bolus administration of intravenous contrast. RADIATION DOSE REDUCTION: This exam was performed according to the departmental dose-optimization program which includes automated exposure control, adjustment of the mA and/or kV according to patient size and/or use of iterative reconstruction technique. CONTRAST:  40mL OMNIPAQUE IOHEXOL 300 MG/ML  SOLN COMPARISON:  CT abdomen and pelvis 11/24/2020 FINDINGS: Lower chest: There are new innumerable bilateral pulmonary micro nodules throughout both lungs. There are new small bilateral pleural effusions. Hepatobiliary: Gallstones are present. There is no biliary ductal dilatation. The liver is within normal limits. Pancreas: Unremarkable. No pancreatic ductal dilatation or surrounding inflammatory changes. Spleen: Normal in size without focal abnormality. Adrenals/Urinary Tract: Adrenal glands are unremarkable. Kidneys are normal, without renal calculi, focal lesion, or hydronephrosis. Bladder is  unremarkable. Stomach/Bowel: There is diffuse colonic diverticulosis without evidence for acute diverticulitis. Appendix is not visualized. No bowel obstruction, wall thickening, focal inflammation or free air. Stomach within normal limits. Vascular/Lymphatic: Aortic atherosclerosis. No enlarged abdominal or pelvic lymph nodes. Reproductive: Status post hysterectomy. No adnexal masses. Other: No ascites or free air. Surgical clips are seen in the left inguinal region. There is some scarring in the left anterior abdominal wall. Musculoskeletal: There is expansile rib lesion with soft tissue component of the anterolateral right eighth rib fracture. This lesion measures 2.6 x 4.9 x 3.7 cm. There also small lytic lesions in the left anterolateral seventh rib. There also likely small lytic lesions throughout multiple lower thoracic vertebral bodies, particularly at T10 and T11. No acute fractures are seen. IMPRESSION: 1. New innumerable pulmonary micro nodules throughout both lungs worrisome for metastatic disease. 2. New small bilateral pleural effusions. 3. Expansile lytic lesion of the right eighth rib with soft tissue component worrisome for metastatic disease. There are additional lytic lesions in the left seventh rib and lower thoracic spine concerning for metastatic disease. 4. No acute localizing process in the abdomen or pelvis. 5. Cholelithiasis. 6. Colonic diverticulosis. Aortic Atherosclerosis (ICD10-I70.0). Electronically Signed   By: Ronney Asters M.D.   On: 02/16/2022 18:40    EKG: I independently viewed the EKG done and my findings are as followed: Sinus rhythm rate of 93.  Nonspecific ST-T changes.  QTc 425.  Assessment/Plan Present on Admission:  Metastatic disease ([place])  Principal Problem:   Metastatic disease ([place])  Abdominal pain likely secondary to metastatic disease Supportive care Pain control, bowel regimen Gentle IV fluid hydration  History of breast cancer, currently being  treated for bladder cancer  CT evidence of primary lung mass, pulmonary metastatic disease, bone mets and ribs fracture.   EDP discussed the case with Dr. Alvy Bimler, hematology oncology, who recommended admission for pain control and Decadron.   Plan for biopsy and further work-up while inpatient.  Hyperlipidemia On Crestor  GERD Resume home PPI  Essential hypertension BPs are soft Hold off home oral antibiotic Monitor vital signs.   DVT prophylaxis: Subcu Lovenox daily  Code Status: Full code, per the patient herself.  Family Communication: None at bedside  Disposition Plan: Admitted to telemetry unit  Consults called: Hematology oncology  Admission status: Observation status.   Status is: Observation    Kayleen Memos MD [facility] Pager [phone]  If 7PM-7AM, please contact night-coverage [email or web address] Password [email or web address]  02/17/2022, 12:41 AM

## 2022-02-18 ENCOUNTER — Ambulatory Visit
Admit: 2022-02-18 | Discharge: 2022-02-18 | Disposition: A | Discharge status: Home / Self Care | Payer: Medicare Other | Source: Ambulatory Visit | Attending: Radiation Oncology | Admitting: Radiation Oncology

## 2022-02-18 ENCOUNTER — Inpatient Hospital Stay (HOSPITAL_COMMUNITY): Payer: Medicare Other

## 2022-02-18 DIAGNOSIS — C7951 Secondary malignant neoplasm of bone: Secondary | ICD-10-CM

## 2022-02-18 DIAGNOSIS — C799 Secondary malignant neoplasm of unspecified site: Secondary | ICD-10-CM | POA: Diagnosis not present

## 2022-02-18 LAB — PROTIME-INR
INR: 1 (ref 0.8–1.2)
Prothrombin Time: 12.8 seconds (ref 11.4–15.2)

## 2022-02-18 MED ORDER — LIDOCAINE HCL 1 % IJ SOLN
INTRAMUSCULAR | Status: AC | PRN
  Administered 2022-02-18: 10 mL via INTRADERMAL

## 2022-02-18 MED ORDER — SODIUM CHLORIDE 0.9 % IV SOLN
INTRAVENOUS | Status: AC
  Filled 2022-02-18: qty 250

## 2022-02-18 MED ORDER — MIDAZOLAM HCL 2 MG/2ML IJ SOLN
INTRAMUSCULAR | Status: AC
  Filled 2022-02-18: qty 2

## 2022-02-18 MED ORDER — OXYCODONE HCL 5 MG PO TABS
15.0000 mg | ORAL_TABLET | ORAL | Status: DC | PRN
  Administered 2022-02-19 – 2022-02-22 (×9): 15 mg via ORAL
  Filled 2022-02-18 (×9): qty 3

## 2022-02-18 MED ORDER — FLUMAZENIL 0.5 MG/5ML IV SOLN
INTRAVENOUS | Status: AC
  Filled 2022-02-18: qty 5

## 2022-02-18 MED ORDER — HYDROMORPHONE HCL 1 MG/ML IJ SOLN
3.0000 mg | INTRAMUSCULAR | Status: DC | PRN
  Administered 2022-02-18 – 2022-02-21 (×5): 3 mg via INTRAVENOUS
  Filled 2022-02-18 (×5): qty 3

## 2022-02-18 MED ORDER — NALOXONE HCL 0.4 MG/ML IJ SOLN
INTRAMUSCULAR | Status: AC
  Filled 2022-02-18: qty 1

## 2022-02-18 MED ORDER — FENTANYL CITRATE (PF) 100 MCG/2ML IJ SOLN
INTRAMUSCULAR | Status: AC
  Filled 2022-02-18: qty 2

## 2022-02-18 MED ORDER — SODIUM CHLORIDE 0.9 % IV SOLN
INTRAVENOUS | Status: AC | PRN
  Administered 2022-02-18: 50 mL/h via INTRAVENOUS

## 2022-02-18 MED ORDER — DEXAMETHASONE 4 MG PO TABS
4.0000 mg | ORAL_TABLET | Freq: Three times a day (TID) | ORAL | Status: DC
  Administered 2022-02-18 – 2022-02-22 (×13): 4 mg via ORAL
  Filled 2022-02-18 (×12): qty 1

## 2022-02-18 MED ORDER — SODIUM CHLORIDE 0.9 % IV SOLN: INTRAVENOUS | Status: AC

## 2022-02-18 NOTE — Consult Note (Signed)
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 automated exposure control, adjustment of the mA and/or kV according to patient size and/or use of iterative reconstruction technique. CONTRAST:  68mL OMNIPAQUE IOHEXOL  300 MG/ML  SOLN COMPARISON:  CT abdomen and pelvis 11/24/2020 FINDINGS: Lower chest: There are new innumerable bilateral pulmonary micro nodules throughout both lungs. There are new small bilateral pleural effusions. Hepatobiliary: Gallstones are present. There is no biliary ductal dilatation. The liver is within normal limits. Pancreas: Unremarkable. No pancreatic ductal dilatation or surrounding inflammatory changes. Spleen: Normal in size without focal abnormality. Adrenals/Urinary Tract: Adrenal glands are unremarkable. Kidneys are normal, without renal calculi, focal lesion, or hydronephrosis. Bladder is unremarkable. Stomach/Bowel: There is diffuse colonic diverticulosis without  evidence for acute diverticulitis. Appendix is not visualized. No bowel obstruction, wall thickening, focal inflammation or free air. Stomach within normal limits. Vascular/Lymphatic: Aortic atherosclerosis. No enlarged abdominal or pelvic lymph nodes. Reproductive: Status post hysterectomy. No adnexal masses. Other: No ascites or free air. Surgical clips are seen in the left inguinal region. There is some scarring in the left anterior abdominal wall. Musculoskeletal: There is expansile rib lesion with soft tissue component of the anterolateral right eighth rib fracture. This lesion measures 2.6 x 4.9 x 3.7 cm. There also small lytic lesions in the left anterolateral seventh rib. There also likely small lytic lesions throughout multiple lower thoracic vertebral bodies, particularly at T10 and T11. No acute fractures are seen. IMPRESSION: 1. New innumerable pulmonary micro nodules throughout both lungs worrisome for metastatic disease. 2. New small bilateral pleural effusions. 3. Expansile lytic lesion of the right eighth rib with soft tissue component worrisome for metastatic disease. There are additional lytic lesions in the left seventh rib and lower thoracic spine concerning for metastatic disease. 4. No acute localizing process in the abdomen or pelvis. 5. Cholelithiasis. 6. Colonic diverticulosis. Aortic Atherosclerosis (ICD10-I70.0). Electronically Signed   By: Ronney Asters M.D.   On: 02/16/2022 18:40      IMPRESSION/PLAN: 1. 76 y.o. 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 Dr. Tammi Klippel has reviewed the patient's work up to date and agrees with plans for biopsy to clarify next steps for her work up and systemic therapy. He would recommend a course of palliative radiotherapy to the T7 spine, right chest wall ribs, and lumbar  spine along L2 primarily. We  discussed the risks, benefits, short, and long term effects of radiotherapy, as well as the palliative intent, and the patient is interested in proceeding. I reviewed the delivery and logistics of radiotherapy and that Dr. Tammi Klippel anticipates a course of 2 weeks of radiotherapy over 10 fractions. Written consent is obtained and placed in the chart, a copy was provided to the patient. She will simulate tomorrow and begin treatment on Wednesday pending pathology. She will need continued outpatient work up as well as ongoing follow up with Dr. Alvy Bimler.  In a visit lasting 70 minutes, greater than 50% of the time was spent face to face and in floor time discussing the patient's condition, in preparation for the discussion, and coordinating the patient's care.      Carola Rhine, [facility]   [name]    Seen with _____________________________________  Sheral Apley Tammi Klippel, M.D.

## 2022-02-18 NOTE — Plan of Care (Signed)

## 2022-02-18 NOTE — Consult Note (Signed)
Chief Complaint: Patient was seen in consultation today for right chest wall mass biopsy  Referring Physician(s): Heath Lark, MD  Supervising Physician: Corrie Mckusick  Patient Status: [facility] - In-pt  History of Present Illness: [name] is a 76 y.o. female with PMH of recently treated Stage IV bladder cancer, remote history of breast cancer treated with chemotherapy and radiation, hypertension, GERD, and fibromyalgia. Patient presented to [facility] ED on 02/16/22 with abdominal pain and lower chest pain. CT Chest wo Contrast revealed presence of left upper lobe lung mass concerning for possible new primary cancer vs metastasis, as well as a right chest wall/rib soft tissue mass concerning for metastasis. IR was consulted to perform a biopsy of the right chest wall/rib mass.  Past Medical History:  Diagnosis Date   Bilateral lower extremity edema    per pt left ankle greater than right weras left leg compression hose prn   Bladder cancer [facility]) 2021   urologist-- dr Milford Cage--- first dx 2021 s/p  TURBT 's with recurrences   CAD (coronary artery disease)    cardiac cath 01/ 2005 by dr Terrence Dupont,  non-obstructive cad;  nuclear study 07-31-2016  normal with no evidence ischemia,nuclear ef 52%   Diverticulosis of colon    Eczema    Fibromyalgia    GERD (gastroesophageal reflux disease)    Hemorrhoids    Hiatal hernia    History of adenomatous polyp of colon    History of cancer chemotherapy 2000   left breast cancer   History of COVID-19 2022   mild all symptoms resolved   History of diverticulitis of colon    History of gastritis    History of kidney stones    History of left breast cancer 2000   dx 2000 and s/p left mastectomy w/ node dissection;  completed chemo and radiation therapy same year 2000 and completed anti-estrogen 2005;  per pt released from oncologist 2006 and no recurrence   Hyperlipidemia    Hypertension    followed by pcp   Nephrolithiasis    per CT  11-24-2020 bilateral tiny non-obstructive stones   Personal history of radiation therapy 2000   left breast cancer   Wears denture upper    Wears glasses     Past Surgical History:  Procedure Laterality Date   ABDOMINAL HYSTERECTOMY  1978   AND LYSIS ADHESIONS /  APPENDECTOMY  (still has ovaries)   ANTERIOR AND POSTERIOR REPAIR WITH SACROSPINOUS FIXATION  07-11-2008  @[facility]    AND ENTEROCELE REPAIR/ UTEROSACRAL COLPOSUSPENSION   BREAST REDUCTION SURGERY Right 2000   CARDIAC CATHETERIZATION  05-10-2003   dr Terrence Dupont   mild non-obstructive cad,  ef 55-60%   CATARACT EXTRACTION W/ INTRAOCULAR LENS  IMPLANT, BILATERAL  2015   COLONOSCOPY  01/2018   by dr Collene Mares   CYST EXCISION  02-28-2020  @[facility]    right axilla   CYSTOSCOPY W/ RETROGRADES Bilateral 03/21/2020   Procedure: CYSTOSCOPY WITH RETROGRADE PYELOGRAM;  Surgeon: Remi Haggard, MD;  [facility];  Service: Urology;  Laterality: Bilateral;   CYSTOSCOPY W/ RETROGRADES Bilateral 01/09/2021   Procedure: CYSTOSCOPY WITH RETROGRADE PYELOGRAM;  Surgeon: Remi Haggard, MD;  Location: [facility];  Service: Urology;  Laterality: Bilateral;   CYSTOSCOPY W/ RETROGRADES Bilateral 07/31/2021   Procedure: CYSTOSCOPY WITH RETROGRADE PYELOGRAM;  Surgeon: Remi Haggard, MD;  Location: [facility];  Service: Urology;  Laterality: Bilateral;   CYSTOSCOPY W/ RETROGRADES Bilateral 11/20/2021   Procedure: CYSTOSCOPY WITH  RETROGRADE PYELOGRAM;  Surgeon: Remi Haggard, MD;  Location: [facility];  Service: Urology;  Laterality: Bilateral;   INGUINAL HERNIA REPAIR  09/27/2011   Procedure: HERNIA REPAIR INGUINAL ADULT;  Surgeon: Gwenyth Ober, MD;  Location: [place];  Service: General;  Laterality: Right;   KNEE ARTHROSCOPY Left 1986   MASTECTOMY WITH AXILLARY LYMPH NODE DISSECTION Left 2000   W/  RECONSTRUCTION   NASAL SINUS SURGERY  1980s   ROTATOR CUFF REPAIR Right 1985    TONSILLECTOMY     child and adenoids removed   TRANSURETHRAL RESECTION OF BLADDER TUMOR N/A 03/21/2020   Procedure: TRANSURETHRAL RESECTION OF BLADDER TUMOR (TURBT);  Surgeon: Remi Haggard, MD;  Location: [facility];  Service: Urology;  Laterality: N/A;  [place] TUMOR N/A 01/09/2021   Procedure: TRANSURETHRAL RESECTION OF BLADDER TUMOR (TURBT);  Surgeon: Remi Haggard, MD;  Location: [facility];  Service: Urology;  Laterality: N/A;   TRANSURETHRAL RESECTION OF BLADDER TUMOR WITH MITOMYCIN-C N/A 07/31/2021   Procedure: TRANSURETHRAL RESECTION OF BLADDER TUMOR WITH GEMCITABINE;  Surgeon: Remi Haggard, MD;  Location: [facility];  Service: Urology;  Laterality: N/A;   TRANSURETHRAL RESECTION OF BLADDER TUMOR WITH MITOMYCIN-C N/A 11/20/2021   Procedure: TRANSURETHRAL RESECTION OF BLADDER TUMOR WITH POSTOPERATIVE  GEMCITABINE;  Surgeon: Remi Haggard, MD;  Location: [facility];  Service: Urology;  Laterality: N/A;  30 MINS    Allergies: Hydrochlorothiazide, Flomax [tamsulosin], Codeine, Statins, and Zetia [ezetimibe]  Medications: Prior to Admission medications   Medication Sig Start Date End Date Taking? Authorizing Provider  acetaminophen (TYLENOL) 325 MG tablet Take 650 mg by mouth every 6 (six) hours as needed for mild pain.   Yes [provider]  BIOTIN PO Take 1 tablet by mouth daily.   Yes [provider]  Calcium Citrate-Vitamin D (CALCIUM CITRATE + D3 PO) Take 1 tablet by mouth 2 (two) times daily.   Yes [provider]  carboxymethylcellulose (REFRESH PLUS) 0.5 % SOLN Place 1 drop into both eyes daily as needed (dry eyes).   Yes [provider]  cetirizine (ZYRTEC) 10 MG tablet Take 10 mg by mouth daily as needed for allergies.   Yes [provider]  cyanocobalamin 1000 MCG tablet Take 1,000 mcg by mouth daily. Vitamin b 12   Yes  [provider]  furosemide (LASIX) 20 MG tablet Take 1 tablet (20 mg total) by mouth daily. 01/28/22  Yes Martinique, Betty G, MD  ibuprofen (ADVIL) 200 MG tablet Take 800 mg by mouth every 6 (six) hours as needed for mild pain.   Yes [provider]  losartan (COZAAR) 100 MG tablet Take 1 tablet (100 mg total) by mouth daily. 02/08/22  Yes Martinique, Betty G, MD  meclizine (ANTIVERT) 25 MG tablet TAKE 1 TABLET BY MOUTH 3  TIMES DAILY AS NEEDED FOR  DIZZINESS Patient taking differently: Take 25 mg by mouth 3 (three) times daily as needed for dizziness. 10/15/21  Yes [place], Betty G, MD  Menthol, Topical Analgesic, (BIOFREEZE ROLL-ON EX) Apply 1 Application topically 2 (two) times daily as needed (pain).   Yes [provider]  pantoprazole (PROTONIX) 40 MG tablet Take 1 tablet (40 mg total) by mouth daily. Patient taking differently: Take 40 mg by mouth at bedtime. 10/15/21  Yes Martinique, Betty G, MD  polyethylene glycol (MIRALAX / GLYCOLAX) 17 g packet Take 17 g by mouth daily  as needed for mild constipation.   Yes [provider]  potassium chloride SA (KLOR-CON M) 20 MEQ tablet TAKE 1 TABLET BY MOUTH  DAILY Patient taking differently: 20 mEq at bedtime. TAKE 1 TABLET BY MOUTH  DAILY 04/04/21  Yes [place]ique, Betty G, MD  Probiotic Product (ALIGN PO) Take 1 tablet by mouth daily.    Yes [provider]  rosuvastatin (CRESTOR) 10 MG tablet 2 to 3 x week in am Patient taking differently: Take 10 mg by mouth 2 (two) times a week. 2 to 3 x week in am 10/09/21  Yes Martinique, Betty G, MD  UNABLE TO FIND Flucinonide usp 0.05 % cream prn for eczema   Yes [provider]     Family History  Problem Relation Age of Onset   Alzheimer's disease Mother    Breast cancer Sister 34   Colon polyps Sister    Cancer Son        prostate   Anesthesia problems Neg Hx     Social History   Socioeconomic History   Marital status: Widowed    Spouse name: Not on file    Number of children: Not on file   Years of education: Not on file   Highest education level: Not on file  Occupational History   Not on file  Tobacco Use   Smoking status: Former    Packs/day: 1.00    Years: 15.00    Total pack years: 15.00    Types: Cigarettes    Quit date: 09/20/1998    Years since quitting: 23.4   Smokeless tobacco: Never  Vaping Use   Vaping Use: Never used  Substance and Sexual Activity   Alcohol use: Not Currently   Drug use: Never   Sexual activity: Not Currently    Birth control/protection: Surgical    Comment: 1st intercourse 76 yo-Fewer than 5 partners, hysterectomy  Other Topics Concern   Not on file  Social History Narrative   Widowed- husband died of Alzheimers in 2013-02-09.   Social Determinants of Health   Financial Resource Strain: Low Risk  (10/08/2021)   Overall Financial Resource Strain (CARDIA)    Difficulty of Paying Living Expenses: Not hard at all  Food Insecurity: No Food Insecurity (02/17/2022)   Hunger Vital Sign    Worried About Running Out of Food in the Last Year: Never true    Ran Out of Food in the Last Year: Never true  Transportation Needs: No Transportation Needs (02/17/2022)   PRAPARE - Hydrologist (Medical): No    Lack of Transportation (Non-Medical): No  Physical Activity: Inactive (10/08/2021)   Exercise Vital Sign    Days of Exercise per Week: 0 days    Minutes of Exercise per Session: 0 min  Stress: No Stress Concern Present (10/08/2021)   [place]    Feeling of Stress : Not at all  Social Connections: Moderately Isolated (10/03/2020)   Social Connection and Isolation Panel [NHANES]    Frequency of Communication with Friends and Family: Twice a week    Frequency of Social Gatherings with Friends and Family: Twice a week    Attends Religious Services: More than 4 times per year    Active Member of [employer] or Organizations:  No    Attends Archivist Meetings: Never    Marital Status: Widowed     Review of Systems: A 12 point ROS discussed and pertinent  positives are indicated in the HPI above.  All other systems are negative.  Review of Systems  Constitutional:  Negative for chills and fever.  Respiratory:  Negative for chest tightness and shortness of breath.   Cardiovascular:  Positive for leg swelling. Negative for chest pain.  Gastrointestinal:  Positive for abdominal pain, diarrhea, nausea and vomiting.  Neurological:  Positive for headaches. Negative for dizziness and light-headedness.  Psychiatric/Behavioral:  Negative for confusion.     Vital Signs: BP 134/70 (BP Location: Right Arm)   Pulse 64   Temp (!) 97.5 F (36.4 C) (Axillary)   Resp 17   Ht 5' 7.5" (1.715 m)   Wt 198 lb 3.1 oz (89.9 kg)   SpO2 92%   BMI 30.58 kg/m   Physical Exam Vitals reviewed.  Constitutional:      General: She is not in acute distress.    Appearance: She is ill-appearing.  HENT:     Head: Normocephalic.     Mouth/Throat:     Mouth: Mucous membranes are moist.  Cardiovascular:     Rate and Rhythm: Normal rate and regular rhythm.     Pulses: Normal pulses.     Heart sounds: Normal heart sounds.  Pulmonary:     Effort: Pulmonary effort is normal.     Breath sounds: Normal breath sounds.  Abdominal:     General: Bowel sounds are normal.     Palpations: Abdomen is soft.     Tenderness: There is no abdominal tenderness.  Musculoskeletal:     Right lower leg: No edema.     Left lower leg: No edema.  Skin:    General: Skin is warm and dry.  Neurological:     Mental Status: She is alert and oriented to person, place, and time.  Psychiatric:        Mood and Affect: Mood normal.        Behavior: Behavior normal.     Imaging: MR TOTAL SPINE METS SCREENING  Result Date: 02/18/2022 CLINICAL DATA:  Lung mass, evaluate for metastatic disease. EXAM: MRI TOTAL SPINE WITHOUT AND WITH CONTRAST  TECHNIQUE: Multisequence MR imaging of the spine from the cervical spine to the sacrum was performed prior to and following IV contrast administration for evaluation of spinal metastatic disease. CONTRAST:  80mL GADAVIST GADOBUTROL 1 MMOL/ML IV SOLN COMPARISON:  No prior MRI of the spine available, correlation is made with CT chest abdomen pelvis 02/16/2022 FINDINGS: Vertebrae: Multifocal areas of decreased T1 signal, increased T2 signal, and contrast enhancement are seen throughout the spine, ribs, and sacrum. The most prominently involved vertebral body is T7, where abnormal signal makes up the majority of the vertebral body (series 25, image 9) with an additional focus in the spinous process (series 25, image 8). No evidence of pathologic fracture. Additional smaller foci of abnormal signal are seen in C2 (series 25, images 8 and 10), C7 body and inferior articular facet (series 25, images 8, 10, and 14), T1 (series 25, image 9), T2 inferior articular facet and rib (series 25, images 1 and 10), third rib (series 25, image 14), T4 body and spinous process (series 25, images 9 and 10), T5 pedicle (series 25, image 6), T8 (series 25, images 6 and 11), T9 body and rib (series 25, images 1 and 6), T10 (series 26, images 6, 9, and 12), T11 (series 26, images 7 and 8), T12 (series 26, images 6-8, 11- 13), L1 (series 26, image 12), L2 (series 26, images 6-11), L3 (  series 26, images 7, 8, 10, and 12), L4 inferior articular facet, pedicle, and transverse process (series 26, images 4, 14, and 26), L5 (series 26, image 8), and the sacrum and iliac bones (series 26, images 1-3, 11-13, 16, and 19). No definite posterior cortical breakthrough and epidural extension of tumor. The posterior cortex T7, T11, and T12 (series 25, image 8 and series 26, image 8), appears intact. No high-grade spinal canal stenosis. Segmentation: 5 lumbar-type vertebral bodies. Alignment: Physiologic. Cord:  Normal morphology and signal.  No abnormal  enhancement. IMPRESSION: 1. Multifocal osseous metastatic disease throughout the spine, ribs, and sacrum and iliac bones, as described above. No evidence of pathologic fracture. 2. No definite epidural extension of tumor. The axial images that were purportedly obtained with this scan were not transmitted at the time of interpretation, and multiple attempts to request the sequences went unanswered. If there is persistent concern, an addendum could be made when these images are transmitted. Electronically Signed   By: Merilyn Baba M.D.   On: 02/18/2022 02:23   MR BRAIN W WO CONTRAST  Result Date: 02/18/2022 CLINICAL DATA:  Lung cancer, staging EXAM: MRI HEAD WITHOUT AND WITH CONTRAST TECHNIQUE: Multiplanar, multiecho pulse sequences of the brain and surrounding structures were obtained without and with intravenous contrast. CONTRAST:  35mL GADAVIST GADOBUTROL 1 MMOL/ML IV SOLN COMPARISON:  07/14/2018 MRI head FINDINGS: Brain: No restricted diffusion to suggest acute or subacute infarct. No abnormal parenchymal or meningeal enhancement. No acute hemorrhage, mass, mass effect, or midline shift. No hydrocephalus or extra-axial collection. No hemosiderin deposition to suggest remote hemorrhage. Scattered T2 hyperintense signal in the periventricular white matter, likely the sequela of mild chronic small vessel ischemic disease. Vascular: Normal arterial flow voids. Normal arterial and venous enhancement. Skull and upper cervical spine: Enhancing osseous lesions in the left parietal bone (series 16, images 79 and 136), right parietal bone (series 16, image 136), right frontal bone (series 16, image 118), and the head of the left mandible (series 16, image 20). Sinuses/Orbits: No acute finding. Status post bilateral lens replacements. Other: Trace fluid in the bilateral mastoid air cells. IMPRESSION: 1. Enhancing osseous lesions in the left parietal bone, right parietal bone, right frontal bone, and head of the left  mandible, concerning for osseous metastatic disease. 2. No evidence of intracranial metastatic disease. Electronically Signed   By: Merilyn Baba M.D.   On: 02/18/2022 01:28   CT Chest Wo Contrast  Result Date: 02/16/2022 CLINICAL DATA:  Lung nodule, > 10mm.  Dyspnea EXAM: CT CHEST WITHOUT CONTRAST TECHNIQUE: Multidetector CT imaging of the chest was performed following the standard protocol without IV contrast. RADIATION DOSE REDUCTION: This exam was performed according to the departmental dose-optimization program which includes automated exposure control, adjustment of the mA and/or kV according to patient size and/or use of iterative reconstruction technique. COMPARISON:  None Available. FINDINGS: Cardiovascular: Moderate multi-vessel coronary artery calcification. Global cardiac size is within normal limits. No pericardial effusion. Central pulmonary arteries are of normal caliber. Mild atherosclerotic calcification within the thoracic aorta. No aortic aneurysm. Mediastinum/Nodes: No enlarged mediastinal or axillary lymph nodes. Thyroid gland, trachea, and esophagus demonstrate no significant findings. Lungs/Pleura: The opacity seen on recent chest radiograph corresponds to a lobulated soft tissue mass within the a anterior left upper lobe measuring 2.9 x 4.1 cm at axial image # 43/2, suspicious for a primary bronchogenic neoplasm. The mass abuts the pleural surface but does not clearly transgress into the anterior mediastinum. There are innumerable micro nodules  seen distributed randomly throughout the lungs bilaterally which appear new since the visualized lung bases on examination of 11/18/2017 and are suspicious for intra pulmonary miliary metastatic disease. Small bilateral pleural effusions are present. No pneumothorax. No central obstructing lesion. Upper Abdomen: No acute abnormality. Musculoskeletal: There are erosive changes involving the right eighth rib anteriorly and left seventh ribs  anterolaterally in keeping with osseous metastatic disease. There are, additionally, subtle lytic lesions seen throughout the thoracic spine, best appreciated within the T10 and T11 vertebral bodies also suspicious osseous metastatic disease. There is a acute appearing fracture of the right seventh rib laterally with associated subpleural thickening likely representing edema. No associated soft tissue mass or erosion is identified and this may simply represent a posttraumatic fracture. Left mastectomy and axillary lymph node dissection has been performed. IMPRESSION: 1. 4.1 cm left upper lobe pulmonary mass suspicious for a primary bronchogenic neoplasm. Innumerable micro nodules distributed randomly throughout the lungs bilaterally suspicious for intra pulmonary miliary metastatic disease. PET CT examination may be helpful to direct biopsy strategy, if indicated. 2. Osseous metastatic disease with erosive changes involving the right eighth rib anteriorly and left seventh ribs anterolaterally. Additional subtle lytic lesions seen throughout the thoracic spine, best appreciated within the T10 and T11 vertebral bodies also suspicious for osseous metastatic disease. This would be better assessed with contrast enhanced MRI examination if indicated. 3. Acute appearing fracture of the right seventh rib laterally with associated subpleural thickening likely representing edema. No associated soft tissue mass or erosion is identified and this may simply represent a posttraumatic fracture. 4. Moderate multi-vessel coronary artery calcification. 5. Small bilateral pleural effusions. 6. Left mastectomy and axillary lymph node dissection. Aortic Atherosclerosis (ICD10-I70.0). Electronically Signed   By: Fidela Salisbury M.D.   On: 02/16/2022 20:22   DG Chest 1 View  Result Date: 02/16/2022 CLINICAL DATA:  Upper abdominal pain.  Shortness of breath. EXAM: CHEST  1 VIEW COMPARISON:  09/23/2011 FINDINGS: Asymmetric elevation  left hemidiaphragm. Right lung is clear. Focal opacity identified in the medial left upper lobe. The cardiopericardial silhouette is within normal limits for size. The visualized bony structures of the thorax are unremarkable. Telemetry leads overlie the chest. IMPRESSION: Focal masslike opacity in the medial left upper lobe. This could potentially be pneumonia, but imaging features are suspicious for soft tissue mass lesion. CT chest recommended to further evaluate. Electronically Signed   By: Misty Stanley M.D.   On: 02/16/2022 18:44   CT ABDOMEN PELVIS W CONTRAST  Result Date: 02/16/2022 CLINICAL DATA:  Acute abdominal pain. EXAM: CT ABDOMEN AND PELVIS WITH CONTRAST TECHNIQUE: Multidetector CT imaging of the abdomen and pelvis was performed using the standard protocol following bolus administration of intravenous contrast. RADIATION DOSE REDUCTION: This exam was performed according to the departmental dose-optimization program which includes automated exposure control, adjustment of the mA and/or kV according to patient size and/or use of iterative reconstruction technique. CONTRAST:  6mL OMNIPAQUE IOHEXOL 300 MG/ML  SOLN COMPARISON:  CT abdomen and pelvis 11/24/2020 FINDINGS: Lower chest: There are new innumerable bilateral pulmonary micro nodules throughout both lungs. There are new small bilateral pleural effusions. Hepatobiliary: Gallstones are present. There is no biliary ductal dilatation. The liver is within normal limits. Pancreas: Unremarkable. No pancreatic ductal dilatation or surrounding inflammatory changes. Spleen: Normal in size without focal abnormality. Adrenals/Urinary Tract: Adrenal glands are unremarkable. Kidneys are normal, without renal calculi, focal lesion, or hydronephrosis. Bladder is unremarkable. Stomach/Bowel: There is diffuse colonic diverticulosis without evidence for acute diverticulitis.  Appendix is not visualized. No bowel obstruction, wall thickening, focal inflammation  or free air. Stomach within normal limits. Vascular/Lymphatic: Aortic atherosclerosis. No enlarged abdominal or pelvic lymph nodes. Reproductive: Status post hysterectomy. No adnexal masses. Other: No ascites or free air. Surgical clips are seen in the left inguinal region. There is some scarring in the left anterior abdominal wall. Musculoskeletal: There is expansile rib lesion with soft tissue component of the anterolateral right eighth rib fracture. This lesion measures 2.6 x 4.9 x 3.7 cm. There also small lytic lesions in the left anterolateral seventh rib. There also likely small lytic lesions throughout multiple lower thoracic vertebral bodies, particularly at T10 and T11. No acute fractures are seen. IMPRESSION: 1. New innumerable pulmonary micro nodules throughout both lungs worrisome for metastatic disease. 2. New small bilateral pleural effusions. 3. Expansile lytic lesion of the right eighth rib with soft tissue component worrisome for metastatic disease. There are additional lytic lesions in the left seventh rib and lower thoracic spine concerning for metastatic disease. 4. No acute localizing process in the abdomen or pelvis. 5. Cholelithiasis. 6. Colonic diverticulosis. Aortic Atherosclerosis (ICD10-I70.0). Electronically Signed   By: Ronney Asters M.D.   On: 02/16/2022 18:40    Labs:  CBC: Recent Labs    09/11/21 1615 02/08/22 1317 02/16/22 1418 02/17/22 0724  WBC 4.8 5.5 5.9 4.9  HGB 12.9 12.3 12.2 10.1*  HCT 41.0 38.7 39.3 32.6*  PLT 231.0 207.0 235 210    COAGS: Recent Labs    02/18/22 0507  INR 1.0    BMP: Recent Labs    10/09/21 1517 02/08/22 1317 02/16/22 1418 02/17/22 0724  NA 140 142 142 137  K 3.8 3.8 3.9 3.8  CL 105 106 104 105  CO2 23 25 23 28   GLUCOSE 92 97 96 114*  BUN 7 14 6* 9  CALCIUM 10.8* 10.9* 11.0* 9.8  CREATININE 0.78 0.91 0.71 0.61  GFRNONAA  --   --  >60 >60    LIVER FUNCTION TESTS: Recent Labs    04/02/21 1223 09/11/21 1615  02/08/22 1317 02/16/22 1418  BILITOT 0.5 0.3 0.4 0.5  AST 13 13 15  14*  ALT 6 7 6  <5  ALKPHOS 100 93 98 113  PROT 7.2 7.0 7.2 7.3  ALBUMIN 4.6 4.5 4.5 4.5    TUMOR MARKERS: No results for input(s): "AFPTM", "CEA", "CA199", "CHROMGRNA" in the last 8760 hours.  Assessment and Plan:  [name] is a 76 yo female being seen today for a right chest wall mass concerning for metastasis of existing Stage IV bladder cancer vs metastasis of new primary lung cancer. After a discussion with Dr Alvy Bimler, it was determined that the right chest wall mass was the site most immediately requiring image-guided biopsy. Case has been reviewed and approved by Dr Earleen Newport, and is set to proceed on 02/18/22.   Risks and benefits of image-guided right chest wall/rib biopsy was discussed with the patient and patient's family including, but not limited to bleeding, infection, damage to adjacent structures or low yield requiring additional tests.  All of the questions were answered and there is agreement to proceed.  Consent signed and in chart.   Thank you for this interesting consult.  I greatly enjoyed [place] and look forward to participating in their care.  A copy of this report was sent to the requesting provider on this date.  Electronically Signed: Lura Em, PA-C 02/18/2022, 11:28 AM   I spent a total of  40 Minutes in face to face in clinical consultation, greater than 50% of which was counseling/coordinating care for right chest wall mass biopsy.

## 2022-02-18 NOTE — Procedures (Signed)
Interventional Radiology Procedure Note  76 yo female with hx treated breast CA and bladder/urothelial CA. Now with imaging evidence of mets, and possible LUL primary lung.  Discussed with Dr. Alvy Bimler.  Plan now for biopsy of right chest wall mass.   Procedure: CT guided liver biopsy of the right chest wall, rib mass, replaced by tumor  Complications: None EBL: None  Recommendations: - Routine wound care - Follow up pathology - Advance diet OK per primary order - Ice prn  Signed,  Corrie Mckusick, DO

## 2022-02-18 NOTE — Progress Notes (Signed)
PROGRESS NOTE  [name]  DOB: 08/18/1945  PCP: [place], Betty G, MD JOI:[id number]  DOA: 02/16/2022  LOS: 1 day  Hospital Day: 3  Brief narrative: [name] is a 76 y.o. female with PMH significant for HTN, HLD, CAD, history of breast cancer, history of bladder cancer.   10/28, patient presented to the ED with complaint of abdominal pain, back pain, rib pain ongoing for last 2 months not relieved with Tylenol, ibuprofen.  Also reported worsening appetite. Work-up in the ED showed CT evidence of primary lung mass, pulmonary metastatic disease, bone mets and ribs fracture.   EDP discussed the case with Dr. Alvy Bimler, who recommended admission for pain control and Decadron.   Admitted to [facility]  Subjective: Patient was seen and examined this morning.  Was being wheeled for IR biopsy. Met with patient's son [name] at bedside.  Another son [name] was available on the phone. Chart reviewed Afebrile, hemodynamically stable, breathing on room air  Assessment and plan: Metastatic cancer Patient presented with pain in the abdomen, ribs and back. CT scan on admission showed possible primary lung cancer with mets to lungs, bone and ribs Per oncologist Dr. Alvy Bimler, primary site could be lung cancer or recurrent breast cancer. Work-up planned with biopsy by IR and pain management. MRI brain showed no evidence of intracranial metastasis but bony metastasis noted in multiple areas in the skull. MRI of the spine showed diffuse metastatic disease without evidence of pathological fracture.  Pain management For pain management, patient is currently on Decadron 4 mg 3 times daily as needed, Dilaudid as needed 3 mg every 4 hours, oxycodone 15 mg every 3 hours as needed. Bowel regimen with Senokot, MiraLAX.  History of breast cancer History of bladder cancer  Per oncology note, patient has s remote history of breast cancer and was treated with left mastectomy with reconstruction  surgery, chemotherapy, radiation therapy, as well as antiestrogen therapy.  The details of her chemotherapy is unknown.  Her prior oncologist has retired She also has history of superficial bladder cancer and had received intravesical treatment under the guidance of urologist.   Hyperlipidemia On Crestor   GERD Continue PPI   Essential hypertension PTA on Lasix 20 mg daily, losartan 100 mg daily. Currently both on hold.  Blood pressure soft.  Continue to monitor.   Goals of care   Code Status: Full Code    Mobility: Encourage ambulation  Skin assessment:     Nutritional status:  Body mass index is 30.58 kg/m.          Diet:  Diet Order             Diet NPO time specified Except for: Sips with Meds  Diet effective midnight                   DVT prophylaxis:  enoxaparin (LOVENOX) injection 40 mg Start: 02/19/22 1000   Antimicrobials: None Fluid: NS at 75 mill per hour Consultants: Oncology, IR Family Communication: Sons   Status is: Inpatient  Continue in-hospital care because: Ongoing work-up for malignancy Level of care: Telemetry   Dispo: The patient is from: Home              Anticipated d/c is to: Hopefully home in 1 to 2 days              Patient currently is not medically stable to d/c.   Difficult to place patient No     Infusions:  sodium chloride 75 mL/hr at 02/18/22 0908   sodium chloride      Scheduled Meds:  dexamethasone  4 mg Oral Q8H   [START ON 02/19/2022] enoxaparin (LOVENOX) injection  40 mg Subcutaneous Q24H   fentaNYL       flumazenil        HYDROmorphone (DILAUDID) injection  1 mg Intravenous Once   midazolam       naloxone       polyethylene glycol  17 g Oral Daily   senna-docusate  1 tablet Oral QHS    PRN meds: acetaminophen, alum & mag hydroxide-simeth, fentaNYL, flumazenil, HYDROmorphone (DILAUDID) injection, lip balm, LORazepam, meclizine, melatonin, midazolam, naloxone, oxyCODONE, prochlorperazine, sodium  chloride   Antimicrobials: Anti-infectives (From admission, onward)    None       Objective: Vitals:   02/17/22 2110 02/18/22 0442  BP:  134/70  Pulse:  64  Resp:    Temp:  (!) 97.5 F (36.4 C)  SpO2: 92% 92%   No intake or output data in the 24 hours ending 02/18/22 1152 Filed Weights   02/16/22 1411  Weight: 89.9 kg   Weight change:  Body mass index is 30.58 kg/m.   Physical Exam: General exam: Pain controlled Skin: No rashes, lesions or ulcers. HEENT: Atraumatic, normocephalic, no obvious bleeding Lungs: Clear to auscultation bilaterally CVS: Regular rate and rhythm, no murmur GI/Abd soft, nontender, nondistended, bowel sound present CNS: Alert, awake, oriented x3 Psychiatry: Sad affect Extremities: No pedal edema, no calf tenderness  Data Review: I have personally reviewed the laboratory data and studies available.  F/u labs ordered Unresulted Labs (From admission, onward)     Start     Ordered   02/24/22 0500  Creatinine, serum  (enoxaparin (LOVENOX)    CrCl >/= 30 ml/min)  Weekly,   R     Comments: while on enoxaparin therapy    02/17/22 0032            Signed, Terrilee Croak, MD [facility] 02/18/2022

## 2022-02-18 NOTE — Progress Notes (Signed)
[place]   DOB:[birth year]   EG#:[id number]    ASSESSMENT & PLAN:  Remote history of treated breast cancer, recently treated bladder cancer now with stage IV metastatic disease with new lung mass and bone lesions Her presentation is highly suspicious for primary metastatic lung cancer but recurrent metastatic breast cancer cannot be excluded I will obtain prior records to determine the treatment that she has received for her breast cancer MRI of the brain showed no evidence of intracranial metastasis MRI of the spine showed diffuse metastatic disease but no evidence of pathological fracture I recommend consulting interventional radiologist for biopsy of the lung lesion/rib lesion In the meantime, we will also focus on pain management   Cancer associated pain I recommend her to continue on steroid along with pain medicine as needed Due to uncontrolled pain, I plan to increase the dose of Dilaudid as needed and oxycodone as needed I also recommend radiation oncology consultation for evaluation and consideration for palliative radiation therapy on the lower spine area  Nausea and vomiting Recommend IV fluids.  She has antinausea medicine to take as needed   Mild anemia chronic disease Observe closely   History of chronic constipation We will get her scheduled laxatives due to anticipated constipation from pain management   Goals of care discussion She needs to be admitted to expedite work-up and for pain management   Discharge planning I anticipate she will be here for the next 2 to 3 days All questions were answered. The patient knows to call the clinic with any problems, questions or concerns.   The total time spent in the appointment was 55 minutes encounter with patients including review of chart and various tests results, discussions about plan of care and coordination of care plan  Heath Lark, MD 02/18/2022 8:24 AM  Subjective:  She is seen this morning.  Her pain  control is slightly better but not comfortable enough to the degree that would be satisfactory to her needs.  She received intravenous Dilaudid at 4 and oxycodone 6 and by 8:00, she is still in significant amount of pain.  She has some nausea and vomiting.  She had recent bowel movement yesterday  Objective:  Vitals:   02/17/22 2110 02/18/22 0442  BP:  134/70  Pulse:  64  Resp:    Temp:  (!) 97.5 F (36.4 C)  SpO2: 92% 92%    No intake or output data in the 24 hours ending 02/18/22 0824  GENERAL:alert, no distress and comfortable NEURO: alert & oriented x 3 with fluent speech, no focal motor/sensory deficits   Labs:  Recent Labs    09/11/21 1615 10/09/21 1517 02/08/22 1317 02/16/22 1418 02/17/22 0724  NA 143   < > 142 142 137  K 4.1   < > 3.8 3.9 3.8  CL 107   < > 106 104 105  CO2 26   < > 25 23 28   GLUCOSE 84   < > 97 96 114*  BUN 11   < > 14 6* 9  CREATININE 0.79   < > 0.91 0.71 0.61  CALCIUM 10.6*   < > 10.9* 11.0* 9.8  GFRNONAA  --   --   --  >60 >60  PROT 7.0  --  7.2 7.3  --   ALBUMIN 4.5  --  4.5 4.5  --   AST 13  --  15 14*  --   ALT 7  --  6 <5  --  ALKPHOS 93  --  98 113  --   BILITOT 0.3  --  0.4 0.5  --    < > = values in this interval not displayed.    Studies: I have personally reviewed her MRI imaging MR TOTAL SPINE METS SCREENING  Result Date: 02/18/2022 CLINICAL DATA:  Lung mass, evaluate for metastatic disease. EXAM: MRI TOTAL SPINE WITHOUT AND WITH CONTRAST TECHNIQUE: Multisequence MR imaging of the spine from the cervical spine to the sacrum was performed prior to and following IV contrast administration for evaluation of spinal metastatic disease. CONTRAST:  56mL GADAVIST GADOBUTROL 1 MMOL/ML IV SOLN COMPARISON:  No prior MRI of the spine available, correlation is made with CT chest abdomen pelvis 02/16/2022 FINDINGS: Vertebrae: Multifocal areas of decreased T1 signal, increased T2 signal, and contrast enhancement are seen throughout the spine,  ribs, and sacrum. The most prominently involved vertebral body is T7, where abnormal signal makes up the majority of the vertebral body (series 25, image 9) with an additional focus in the spinous process (series 25, image 8). No evidence of pathologic fracture. Additional smaller foci of abnormal signal are seen in C2 (series 25, images 8 and 10), C7 body and inferior articular facet (series 25, images 8, 10, and 14), T1 (series 25, image 9), T2 inferior articular facet and rib (series 25, images 1 and 10), third rib (series 25, image 14), T4 body and spinous process (series 25, images 9 and 10), T5 pedicle (series 25, image 6), T8 (series 25, images 6 and 11), T9 body and rib (series 25, images 1 and 6), T10 (series 26, images 6, 9, and 12), T11 (series 26, images 7 and 8), T12 (series 26, images 6-8, 11- 13), L1 (series 26, image 12), L2 (series 26, images 6-11), L3 (series 26, images 7, 8, 10, and 12), L4 inferior articular facet, pedicle, and transverse process (series 26, images 4, 14, and 26), L5 (series 26, image 8), and the sacrum and iliac bones (series 26, images 1-3, 11-13, 16, and 19). No definite posterior cortical breakthrough and epidural extension of tumor. The posterior cortex T7, T11, and T12 (series 25, image 8 and series 26, image 8), appears intact. No high-grade spinal canal stenosis. Segmentation: 5 lumbar-type vertebral bodies. Alignment: Physiologic. Cord:  Normal morphology and signal.  No abnormal enhancement. IMPRESSION: 1. Multifocal osseous metastatic disease throughout the spine, ribs, and sacrum and iliac bones, as described above. No evidence of pathologic fracture. 2. No definite epidural extension of tumor. The axial images that were purportedly obtained with this scan were not transmitted at the time of interpretation, and multiple attempts to request the sequences went unanswered. If there is persistent concern, an addendum could be made when these images are transmitted.  Electronically Signed   By: Merilyn Baba M.D.   On: 02/18/2022 02:23   MR BRAIN W WO CONTRAST  Result Date: 02/18/2022 CLINICAL DATA:  Lung cancer, staging EXAM: MRI HEAD WITHOUT AND WITH CONTRAST TECHNIQUE: Multiplanar, multiecho pulse sequences of the brain and surrounding structures were obtained without and with intravenous contrast. CONTRAST:  5mL GADAVIST GADOBUTROL 1 MMOL/ML IV SOLN COMPARISON:  07/14/2018 MRI head FINDINGS: Brain: No restricted diffusion to suggest acute or subacute infarct. No abnormal parenchymal or meningeal enhancement. No acute hemorrhage, mass, mass effect, or midline shift. No hydrocephalus or extra-axial collection. No hemosiderin deposition to suggest remote hemorrhage. Scattered T2 hyperintense signal in the periventricular white matter, likely the sequela of mild chronic small vessel ischemic disease. Vascular:  Normal arterial flow voids. Normal arterial and venous enhancement. Skull and upper cervical spine: Enhancing osseous lesions in the left parietal bone (series 16, images 79 and 136), right parietal bone (series 16, image 136), right frontal bone (series 16, image 118), and the head of the left mandible (series 16, image 20). Sinuses/Orbits: No acute finding. Status post bilateral lens replacements. Other: Trace fluid in the bilateral mastoid air cells. IMPRESSION: 1. Enhancing osseous lesions in the left parietal bone, right parietal bone, right frontal bone, and head of the left mandible, concerning for osseous metastatic disease. 2. No evidence of intracranial metastatic disease. Electronically Signed   By: Merilyn Baba M.D.   On: 02/18/2022 01:28   CT Chest Wo Contrast  Result Date: 02/16/2022 CLINICAL DATA:  Lung nodule, > 69mm.  Dyspnea EXAM: CT CHEST WITHOUT CONTRAST TECHNIQUE: Multidetector CT imaging of the chest was performed following the standard protocol without IV contrast. RADIATION DOSE REDUCTION: This exam was performed according to the  departmental dose-optimization program which includes automated exposure control, adjustment of the mA and/or kV according to patient size and/or use of iterative reconstruction technique. COMPARISON:  None Available. FINDINGS: Cardiovascular: Moderate multi-vessel coronary artery calcification. Global cardiac size is within normal limits. No pericardial effusion. Central pulmonary arteries are of normal caliber. Mild atherosclerotic calcification within the thoracic aorta. No aortic aneurysm. Mediastinum/Nodes: No enlarged mediastinal or axillary lymph nodes. Thyroid gland, trachea, and esophagus demonstrate no significant findings. Lungs/Pleura: The opacity seen on recent chest radiograph corresponds to a lobulated soft tissue mass within the a anterior left upper lobe measuring 2.9 x 4.1 cm at axial image # 43/2, suspicious for a primary bronchogenic neoplasm. The mass abuts the pleural surface but does not clearly transgress into the anterior mediastinum. There are innumerable micro nodules seen distributed randomly throughout the lungs bilaterally which appear new since the visualized lung bases on examination of 11/18/2017 and are suspicious for intra pulmonary miliary metastatic disease. Small bilateral pleural effusions are present. No pneumothorax. No central obstructing lesion. Upper Abdomen: No acute abnormality. Musculoskeletal: There are erosive changes involving the right eighth rib anteriorly and left seventh ribs anterolaterally in keeping with osseous metastatic disease. There are, additionally, subtle lytic lesions seen throughout the thoracic spine, best appreciated within the T10 and T11 vertebral bodies also suspicious osseous metastatic disease. There is a acute appearing fracture of the right seventh rib laterally with associated subpleural thickening likely representing edema. No associated soft tissue mass or erosion is identified and this may simply represent a posttraumatic fracture. Left  mastectomy and axillary lymph node dissection has been performed. IMPRESSION: 1. 4.1 cm left upper lobe pulmonary mass suspicious for a primary bronchogenic neoplasm. Innumerable micro nodules distributed randomly throughout the lungs bilaterally suspicious for intra pulmonary miliary metastatic disease. PET CT examination may be helpful to direct biopsy strategy, if indicated. 2. Osseous metastatic disease with erosive changes involving the right eighth rib anteriorly and left seventh ribs anterolaterally. Additional subtle lytic lesions seen throughout the thoracic spine, best appreciated within the T10 and T11 vertebral bodies also suspicious for osseous metastatic disease. This would be better assessed with contrast enhanced MRI examination if indicated. 3. Acute appearing fracture of the right seventh rib laterally with associated subpleural thickening likely representing edema. No associated soft tissue mass or erosion is identified and this may simply represent a posttraumatic fracture. 4. Moderate multi-vessel coronary artery calcification. 5. Small bilateral pleural effusions. 6. Left mastectomy and axillary lymph node dissection. Aortic Atherosclerosis (ICD10-I70.0). Electronically  Signed   By: Fidela Salisbury M.D.   On: 02/16/2022 20:22   DG Chest 1 View  Result Date: 02/16/2022 CLINICAL DATA:  Upper abdominal pain.  Shortness of breath. EXAM: CHEST  1 VIEW COMPARISON:  09/23/2011 FINDINGS: Asymmetric elevation left hemidiaphragm. Right lung is clear. Focal opacity identified in the medial left upper lobe. The cardiopericardial silhouette is within normal limits for size. The visualized bony structures of the thorax are unremarkable. Telemetry leads overlie the chest. IMPRESSION: Focal masslike opacity in the medial left upper lobe. This could potentially be pneumonia, but imaging features are suspicious for soft tissue mass lesion. CT chest recommended to further evaluate. Electronically Signed   By:  Misty Stanley M.D.   On: 02/16/2022 18:44   CT ABDOMEN PELVIS W CONTRAST  Result Date: 02/16/2022 CLINICAL DATA:  Acute abdominal pain. EXAM: CT ABDOMEN AND PELVIS WITH CONTRAST TECHNIQUE: Multidetector CT imaging of the abdomen and pelvis was performed using the standard protocol following bolus administration of intravenous contrast. RADIATION DOSE REDUCTION: This exam was performed according to the departmental dose-optimization program which includes automated exposure control, adjustment of the mA and/or kV according to patient size and/or use of iterative reconstruction technique. CONTRAST:  59mL OMNIPAQUE IOHEXOL 300 MG/ML  SOLN COMPARISON:  CT abdomen and pelvis 11/24/2020 FINDINGS: Lower chest: There are new innumerable bilateral pulmonary micro nodules throughout both lungs. There are new small bilateral pleural effusions. Hepatobiliary: Gallstones are present. There is no biliary ductal dilatation. The liver is within normal limits. Pancreas: Unremarkable. No pancreatic ductal dilatation or surrounding inflammatory changes. Spleen: Normal in size without focal abnormality. Adrenals/Urinary Tract: Adrenal glands are unremarkable. Kidneys are normal, without renal calculi, focal lesion, or hydronephrosis. Bladder is unremarkable. Stomach/Bowel: There is diffuse colonic diverticulosis without evidence for acute diverticulitis. Appendix is not visualized. No bowel obstruction, wall thickening, focal inflammation or free air. Stomach within normal limits. Vascular/Lymphatic: Aortic atherosclerosis. No enlarged abdominal or pelvic lymph nodes. Reproductive: Status post hysterectomy. No adnexal masses. Other: No ascites or free air. Surgical clips are seen in the left inguinal region. There is some scarring in the left anterior abdominal wall. Musculoskeletal: There is expansile rib lesion with soft tissue component of the anterolateral right eighth rib fracture. This lesion measures 2.6 x 4.9 x 3.7 cm.  There also small lytic lesions in the left anterolateral seventh rib. There also likely small lytic lesions throughout multiple lower thoracic vertebral bodies, particularly at T10 and T11. No acute fractures are seen. IMPRESSION: 1. New innumerable pulmonary micro nodules throughout both lungs worrisome for metastatic disease. 2. New small bilateral pleural effusions. 3. Expansile lytic lesion of the right eighth rib with soft tissue component worrisome for metastatic disease. There are additional lytic lesions in the left seventh rib and lower thoracic spine concerning for metastatic disease. 4. No acute localizing process in the abdomen or pelvis. 5. Cholelithiasis. 6. Colonic diverticulosis. Aortic Atherosclerosis (ICD10-I70.0). Electronically Signed   By: Ronney Asters M.D.   On: 02/16/2022 18:40

## 2022-02-18 NOTE — Progress Notes (Signed)
  Transition of Care [facility]) Screening Note   Patient Details  Name: [name] Date of Birth: 06/02/1945   Transition of Care [facility]) CM/SW Contact:    Vassie Moselle, LCSW Phone Number: 02/18/2022, 3:51 PM    Transition of Care Department [facility]) has reviewed patient and no TOC needs have been identified at this time. We will continue to monitor patient advancement through interdisciplinary progression rounds. If new patient transition needs arise, please place a TOC consult.

## 2022-02-19 ENCOUNTER — Ambulatory Visit
Admit: 2022-02-19 | Discharge: 2022-02-19 | Disposition: A | Discharge status: Home / Self Care | Payer: Medicare Other | Attending: Radiation Oncology | Admitting: Radiation Oncology

## 2022-02-19 DIAGNOSIS — C7951 Secondary malignant neoplasm of bone: Secondary | ICD-10-CM

## 2022-02-19 DIAGNOSIS — C799 Secondary malignant neoplasm of unspecified site: Secondary | ICD-10-CM | POA: Diagnosis not present

## 2022-02-19 NOTE — Progress Notes (Signed)
[name]   DOB:Sep 10, 1945   UT#:[id number]    ASSESSMENT & PLAN:   Remote history of treated breast cancer, recently treated bladder cancer now with stage IV metastatic disease with new lung mass and bone lesions Her presentation is highly suspicious for primary metastatic lung cancer but recurrent metastatic breast cancer cannot be excluded I will obtain prior records to determine the treatment that she has received for her breast cancer MRI of the brain showed no evidence of intracranial metastasis MRI of the spine showed diffuse metastatic disease but no evidence of pathological fracture Biopsy of rib lesion was performed on October 30, results pending In the meantime, we will also focus on pain management Port placement is pending   Cancer associated pain Her pain is better controlled  Palliative radiation consult obtained, to start ASAP  nausea and vomiting Recommend IV fluids.  She has antinausea medicine to take as needed   Mild anemia chronic disease Observe closely   History of chronic constipation We will get her scheduled laxatives due to anticipated constipation from pain management   Goals of care discussion She needs to be admitted to expedite work-up and for pain management   Discharge planning I anticipate she will be here for the next 2 to 3 days She is aware that I will not be seeing her tomorrow but I will return on Thursday If she is discharged by Thursday, I will call her next week for future follow-up, depending on outcome of pathology result  All questions were answered. The patient knows to call the clinic with any problems, questions or concerns.   The total time spent in the appointment was 55 minutes encounter with patients including review of chart and various tests results, discussions about plan of care and coordination of care plan  Heath Lark, MD 02/19/2022 9:43 AM  Subjective:  She felt better but is still in pain.  She had bowel  movement recently.  She tolerated biopsy well  Objective:  Vitals:   02/19/22 0814 02/19/22 0859  BP:    Pulse:    Resp: 20 17  Temp:    SpO2:       Intake/Output Summary (Last 24 hours) at 02/19/2022 0943 Last data filed at 02/19/2022 0423 Gross per 24 hour  Intake 1025.66 ml  Output --  Net 1025.66 ml    GENERAL:alert, no distress and comfortable NEURO: alert & oriented x 3 with fluent speech, no focal motor/sensory deficits   Labs:  Recent Labs    09/11/21 1615 10/09/21 1517 02/08/22 1317 02/16/22 1418 02/17/22 0724  NA 143   < > 142 142 137  K 4.1   < > 3.8 3.9 3.8  CL 107   < > 106 104 105  CO2 26   < > 25 23 28   GLUCOSE 84   < > 97 96 114*  BUN 11   < > 14 6* 9  CREATININE 0.79   < > 0.91 0.71 0.61  CALCIUM 10.6*   < > 10.9* 11.0* 9.8  GFRNONAA  --   --   --  >60 >60  PROT 7.0  --  7.2 7.3  --   ALBUMIN 4.5  --  4.5 4.5  --   AST 13  --  15 14*  --   ALT 7  --  6 <5  --   ALKPHOS 93  --  98 113  --   BILITOT 0.3  --  0.4 0.5  --    < > =  values in this interval not displayed.    Studies:  CT BONE TROCAR/NEEDLE BIOPSY SUPERFICIAL  Result Date: 02/18/2022 INDICATION: 76 year old female referred for biopsy of right chest wall mass EXAM: CT-GUIDED BIOPSY RIGHT CHEST WALL MASS MEDICATIONS: None. ANESTHESIA/SEDATION: Moderate (conscious) sedation was not employed during this procedure. Moderate Sedation Time: 0 minutes. The patient's level of consciousness and vital signs were monitored continuously by radiology nursing throughout the procedure under my direct supervision. FLUOROSCOPY TIME:  CT COMPLICATIONS: None PROCEDURE: Informed written consent was obtained from the patient after a thorough discussion of the procedural risks, benefits and alternatives. All questions were addressed. Maximal Sterile Barrier Technique was utilized including caps, mask, sterile gowns, sterile gloves, sterile drape, hand hygiene and skin antiseptic. A timeout was performed prior  to the initiation of the procedure. Patient positioned supine on the CT gantry table. Scout CT was acquired for planning purposes. The right chest wall was prepped with chlorhexidine in a sterile fashion, and a sterile drape was applied covering the operative field. A sterile gown and sterile gloves were used for the procedure. Local anesthesia was provided with 1% Lidocaine. CT guidance was used for placement of a guide needle into the right chest wall mass, with soft tissue replacing right seventh rib. Needle was tangential to the rib, directed superficially. Multiple 18 gauge core biopsy then acquired in placed into formalin. Needle was removed and CT was performed at 5 minutes and 10 minutes to observe a small hematoma which did not enlarge over time. Patient tolerated the procedure well and remained hemodynamically stable throughout. No complications were encountered and no significant blood loss was encounter IMPRESSION: Status post CT-guided biopsy of soft tissue mass replacing the right anterior seventh rib Signed, Dulcy Fanny. Nadene Rubins, RPVI [facility] Electronically Signed   By: Corrie Mckusick D.O.   On: 02/18/2022 13:03   MR TOTAL SPINE METS SCREENING  Result Date: 02/18/2022 CLINICAL DATA:  Lung mass, evaluate for metastatic disease. EXAM: MRI TOTAL SPINE WITHOUT AND WITH CONTRAST TECHNIQUE: Multisequence MR imaging of the spine from the cervical spine to the sacrum was performed prior to and following IV contrast administration for evaluation of spinal metastatic disease. CONTRAST:  70mL GADAVIST GADOBUTROL 1 MMOL/ML IV SOLN COMPARISON:  No prior MRI of the spine available, correlation is made with CT chest abdomen pelvis 02/16/2022 FINDINGS: Vertebrae: Multifocal areas of decreased T1 signal, increased T2 signal, and contrast enhancement are seen throughout the spine, ribs, and sacrum. The most prominently involved vertebral body is T7,  where abnormal signal makes up the majority of the vertebral body (series 25, image 9) with an additional focus in the spinous process (series 25, image 8). No evidence of pathologic fracture. Additional smaller foci of abnormal signal are seen in C2 (series 25, images 8 and 10), C7 body and inferior articular facet (series 25, images 8, 10, and 14), T1 (series 25, image 9), T2 inferior articular facet and rib (series 25, images 1 and 10), third rib (series 25, image 14), T4 body and spinous process (series 25, images 9 and 10), T5 pedicle (series 25, image 6), T8 (series 25, images 6 and 11), T9 body and rib (series 25, images 1 and 6), T10 (series 26, images 6, 9, and 12), T11 (series 26, images 7 and 8), T12 (series 26, images 6-8, 11- 13), L1 (series 26, image 12), L2 (series 26, images 6-11), L3 (series 26, images 7, 8, 10, and 12), L4 inferior articular  facet, pedicle, and transverse process (series 26, images 4, 14, and 26), L5 (series 26, image 8), and the sacrum and iliac bones (series 26, images 1-3, 11-13, 16, and 19). No definite posterior cortical breakthrough and epidural extension of tumor. The posterior cortex T7, T11, and T12 (series 25, image 8 and series 26, image 8), appears intact. No high-grade spinal canal stenosis. Segmentation: 5 lumbar-type vertebral bodies. Alignment: Physiologic. Cord:  Normal morphology and signal.  No abnormal enhancement. IMPRESSION: 1. Multifocal osseous metastatic disease throughout the spine, ribs, and sacrum and iliac bones, as described above. No evidence of pathologic fracture. 2. No definite epidural extension of tumor. The axial images that were purportedly obtained with this scan were not transmitted at the time of interpretation, and multiple attempts to request the sequences went unanswered. If there is persistent concern, an addendum could be made when these images are transmitted. Electronically Signed   By: Merilyn Baba M.D.   On: 02/18/2022 02:23   MR  BRAIN W WO CONTRAST  Result Date: 02/18/2022 CLINICAL DATA:  Lung cancer, staging EXAM: MRI HEAD WITHOUT AND WITH CONTRAST TECHNIQUE: Multiplanar, multiecho pulse sequences of the brain and surrounding structures were obtained without and with intravenous contrast. CONTRAST:  36mL GADAVIST GADOBUTROL 1 MMOL/ML IV SOLN COMPARISON:  07/14/2018 MRI head FINDINGS: Brain: No restricted diffusion to suggest acute or subacute infarct. No abnormal parenchymal or meningeal enhancement. No acute hemorrhage, mass, mass effect, or midline shift. No hydrocephalus or extra-axial collection. No hemosiderin deposition to suggest remote hemorrhage. Scattered T2 hyperintense signal in the periventricular white matter, likely the sequela of mild chronic small vessel ischemic disease. Vascular: Normal arterial flow voids. Normal arterial and venous enhancement. Skull and upper cervical spine: Enhancing osseous lesions in the left parietal bone (series 16, images 79 and 136), right parietal bone (series 16, image 136), right frontal bone (series 16, image 118), and the head of the left mandible (series 16, image 20). Sinuses/Orbits: No acute finding. Status post bilateral lens replacements. Other: Trace fluid in the bilateral mastoid air cells. IMPRESSION: 1. Enhancing osseous lesions in the left parietal bone, right parietal bone, right frontal bone, and head of the left mandible, concerning for osseous metastatic disease. 2. No evidence of intracranial metastatic disease. Electronically Signed   By: Merilyn Baba M.D.   On: 02/18/2022 01:28   CT Chest Wo Contrast  Result Date: 02/16/2022 CLINICAL DATA:  Lung nodule, > 56mm.  Dyspnea EXAM: CT CHEST WITHOUT CONTRAST TECHNIQUE: Multidetector CT imaging of the chest was performed following the standard protocol without IV contrast. RADIATION DOSE REDUCTION: This exam was performed according to the departmental dose-optimization program which includes automated exposure control,  adjustment of the mA and/or kV according to patient size and/or use of iterative reconstruction technique. COMPARISON:  None Available. FINDINGS: Cardiovascular: Moderate multi-vessel coronary artery calcification. Global cardiac size is within normal limits. No pericardial effusion. Central pulmonary arteries are of normal caliber. Mild atherosclerotic calcification within the thoracic aorta. No aortic aneurysm. Mediastinum/Nodes: No enlarged mediastinal or axillary lymph nodes. Thyroid gland, trachea, and esophagus demonstrate no significant findings. Lungs/Pleura: The opacity seen on recent chest radiograph corresponds to a lobulated soft tissue mass within the a anterior left upper lobe measuring 2.9 x 4.1 cm at axial image # 43/2, suspicious for a primary bronchogenic neoplasm. The mass abuts the pleural surface but does not clearly transgress into the anterior mediastinum. There are innumerable micro nodules seen distributed randomly throughout the lungs bilaterally which appear new since  the visualized lung bases on examination of 11/18/2017 and are suspicious for intra pulmonary miliary metastatic disease. Small bilateral pleural effusions are present. No pneumothorax. No central obstructing lesion. Upper Abdomen: No acute abnormality. Musculoskeletal: There are erosive changes involving the right eighth rib anteriorly and left seventh ribs anterolaterally in keeping with osseous metastatic disease. There are, additionally, subtle lytic lesions seen throughout the thoracic spine, best appreciated within the T10 and T11 vertebral bodies also suspicious osseous metastatic disease. There is a acute appearing fracture of the right seventh rib laterally with associated subpleural thickening likely representing edema. No associated soft tissue mass or erosion is identified and this may simply represent a posttraumatic fracture. Left mastectomy and axillary lymph node dissection has been performed. IMPRESSION: 1.  4.1 cm left upper lobe pulmonary mass suspicious for a primary bronchogenic neoplasm. Innumerable micro nodules distributed randomly throughout the lungs bilaterally suspicious for intra pulmonary miliary metastatic disease. PET CT examination may be helpful to direct biopsy strategy, if indicated. 2. Osseous metastatic disease with erosive changes involving the right eighth rib anteriorly and left seventh ribs anterolaterally. Additional subtle lytic lesions seen throughout the thoracic spine, best appreciated within the T10 and T11 vertebral bodies also suspicious for osseous metastatic disease. This would be better assessed with contrast enhanced MRI examination if indicated. 3. Acute appearing fracture of the right seventh rib laterally with associated subpleural thickening likely representing edema. No associated soft tissue mass or erosion is identified and this may simply represent a posttraumatic fracture. 4. Moderate multi-vessel coronary artery calcification. 5. Small bilateral pleural effusions. 6. Left mastectomy and axillary lymph node dissection. Aortic Atherosclerosis (ICD10-I70.0). Electronically Signed   By: Fidela Salisbury M.D.   On: 02/16/2022 20:22   DG Chest 1 View  Result Date: 02/16/2022 CLINICAL DATA:  Upper abdominal pain.  Shortness of breath. EXAM: CHEST  1 VIEW COMPARISON:  09/23/2011 FINDINGS: Asymmetric elevation left hemidiaphragm. Right lung is clear. Focal opacity identified in the medial left upper lobe. The cardiopericardial silhouette is within normal limits for size. The visualized bony structures of the thorax are unremarkable. Telemetry leads overlie the chest. IMPRESSION: Focal masslike opacity in the medial left upper lobe. This could potentially be pneumonia, but imaging features are suspicious for soft tissue mass lesion. CT chest recommended to further evaluate. Electronically Signed   By: Misty Stanley M.D.   On: 02/16/2022 18:44   CT ABDOMEN PELVIS W  CONTRAST  Result Date: 02/16/2022 CLINICAL DATA:  Acute abdominal pain. EXAM: CT ABDOMEN AND PELVIS WITH CONTRAST TECHNIQUE: Multidetector CT imaging of the abdomen and pelvis was performed using the standard protocol following bolus administration of intravenous contrast. RADIATION DOSE REDUCTION: This exam was performed according to the departmental dose-optimization program which includes automated exposure control, adjustment of the mA and/or kV according to patient size and/or use of iterative reconstruction technique. CONTRAST:  62mL OMNIPAQUE IOHEXOL 300 MG/ML  SOLN COMPARISON:  CT abdomen and pelvis 11/24/2020 FINDINGS: Lower chest: There are new innumerable bilateral pulmonary micro nodules throughout both lungs. There are new small bilateral pleural effusions. Hepatobiliary: Gallstones are present. There is no biliary ductal dilatation. The liver is within normal limits. Pancreas: Unremarkable. No pancreatic ductal dilatation or surrounding inflammatory changes. Spleen: Normal in size without focal abnormality. Adrenals/Urinary Tract: Adrenal glands are unremarkable. Kidneys are normal, without renal calculi, focal lesion, or hydronephrosis. Bladder is unremarkable. Stomach/Bowel: There is diffuse colonic diverticulosis without evidence for acute diverticulitis. Appendix is not visualized. No bowel obstruction, wall thickening, focal inflammation  or free air. Stomach within normal limits. Vascular/Lymphatic: Aortic atherosclerosis. No enlarged abdominal or pelvic lymph nodes. Reproductive: Status post hysterectomy. No adnexal masses. Other: No ascites or free air. Surgical clips are seen in the left inguinal region. There is some scarring in the left anterior abdominal wall. Musculoskeletal: There is expansile rib lesion with soft tissue component of the anterolateral right eighth rib fracture. This lesion measures 2.6 x 4.9 x 3.7 cm. There also small lytic lesions in the left anterolateral seventh rib.  There also likely small lytic lesions throughout multiple lower thoracic vertebral bodies, particularly at T10 and T11. No acute fractures are seen. IMPRESSION: 1. New innumerable pulmonary micro nodules throughout both lungs worrisome for metastatic disease. 2. New small bilateral pleural effusions. 3. Expansile lytic lesion of the right eighth rib with soft tissue component worrisome for metastatic disease. There are additional lytic lesions in the left seventh rib and lower thoracic spine concerning for metastatic disease. 4. No acute localizing process in the abdomen or pelvis. 5. Cholelithiasis. 6. Colonic diverticulosis. Aortic Atherosclerosis (ICD10-I70.0). Electronically Signed   By: Ronney Asters M.D.   On: 02/16/2022 18:40

## 2022-02-19 NOTE — Consult Note (Signed)
Chief Complaint: Patient was seen in consultation today for Port-a-Cath placement  Referring Physician(s): Heath Lark, MD  Supervising Physician: Michaelle Birks  Patient Status: [facility] - In-pt  History of Present Illness: [name] is a 76 y.o. female with PMH of recently treated Stage IV bladder cancer, remote history of breast cancer treated with chemotherapy and radiation, hypertension, GERD, and fibromyalgia. Patient presented to [facility] ED on 02/16/22 with abdominal pain and lower chest pain. CT Chest wo Contrast revealed presence of left upper lobe lung mass concerning for possible new primary cancer vs metastasis.  IR performed biopsy of the right chest wall/rib mass on 02/18/22.  IR consulted for placement of Port-a-Cath for intended chemotherapy initiation.  [name] confirms abdominal pain and leg swelling today.  [name] endorses dizziness and palpitations when [name] stands first thing in the mornings.  [name] denies chills/fever, SOB, chest pain, vomiting, change in bowel or urinary habits.  [name] is accompanied by 3 family members bedside at time of consult  Past Medical History:  Diagnosis Date   Bilateral lower extremity edema    per pt left ankle greater than right weras left leg compression hose prn   Bladder cancer [facility]) 2021   urologist-- dr Milford Cage--- first dx 2021 s/p  TURBT 's with recurrences   CAD (coronary artery disease)    cardiac cath 01/ 2005 by dr Terrence Dupont,  non-obstructive cad;  nuclear study 07-31-2016  normal with no evidence ischemia,nuclear ef 52%   Diverticulosis of colon    Eczema    Fibromyalgia    GERD (gastroesophageal reflux disease)    Hemorrhoids    Hiatal hernia    History of adenomatous polyp of colon    History of cancer chemotherapy 2000   left breast cancer   History of COVID-19 2022   mild all symptoms resolved   History of diverticulitis of colon    History of gastritis    History of kidney stones    History of left breast cancer  2000   dx 2000 and s/p left mastectomy w/ node dissection;  completed chemo and radiation therapy same year 2000 and completed anti-estrogen 2005;  per pt released from oncologist 2006 and no recurrence   Hyperlipidemia    Hypertension    followed by pcp   Nephrolithiasis    per CT 11-24-2020 bilateral tiny non-obstructive stones   Personal history of radiation therapy 2000   left breast cancer   Wears denture upper    Wears glasses     Past Surgical History:  Procedure Laterality Date   ABDOMINAL HYSTERECTOMY  1978   AND LYSIS ADHESIONS /  APPENDECTOMY  (still has ovaries)   ANTERIOR AND POSTERIOR REPAIR WITH SACROSPINOUS FIXATION  07-11-2008  @[facility]    AND ENTEROCELE REPAIR/ UTEROSACRAL COLPOSUSPENSION   BREAST REDUCTION SURGERY Right 2000   CARDIAC CATHETERIZATION  05-10-2003   dr Terrence Dupont   mild non-obstructive cad,  ef 55-60%   CATARACT EXTRACTION W/ INTRAOCULAR LENS  IMPLANT, BILATERAL  2015   COLONOSCOPY  01/2018   by dr Collene Mares   CYST EXCISION  02-28-2020  @[facility]    right axilla   CYSTOSCOPY W/ RETROGRADES Bilateral 03/21/2020   Procedure: CYSTOSCOPY WITH RETROGRADE PYELOGRAM;  Surgeon: Remi Haggard, MD;  Location: [facility];  Service: Urology;  Laterality: Bilateral;   CYSTOSCOPY W/ RETROGRADES Bilateral 01/09/2021   Procedure: CYSTOSCOPY WITH RETROGRADE PYELOGRAM;  Surgeon: Remi Haggard, MD;  [facility];  Service: Urology;  Laterality:  Bilateral;   CYSTOSCOPY W/ RETROGRADES Bilateral 07/31/2021   Procedure: CYSTOSCOPY WITH RETROGRADE PYELOGRAM;  Surgeon: Remi Haggard, MD;  Location: [facility];  Service: Urology;  Laterality: Bilateral;   CYSTOSCOPY W/ RETROGRADES Bilateral 11/20/2021   Procedure: CYSTOSCOPY WITH RETROGRADE PYELOGRAM;  Surgeon: Remi Haggard, MD;  Location: [facility];  Service: Urology;  Laterality: Bilateral;   INGUINAL HERNIA REPAIR  09/27/2011   Procedure: HERNIA  REPAIR INGUINAL ADULT;  Surgeon: Gwenyth Ober, MD;  Location: [place];  Service: General;  Laterality: Right;   KNEE ARTHROSCOPY Left 1986   MASTECTOMY WITH AXILLARY LYMPH NODE DISSECTION Left 2000   W/  RECONSTRUCTION   NASAL SINUS SURGERY  1980s   ROTATOR CUFF REPAIR Right 1985   TONSILLECTOMY     child and adenoids removed   TRANSURETHRAL RESECTION OF BLADDER TUMOR N/A 03/21/2020   Procedure: TRANSURETHRAL RESECTION OF BLADDER TUMOR (TURBT);  Surgeon: Remi Haggard, MD;  Location: [facility];  Service: Urology;  Laterality: N/A;  [place] TUMOR N/A 01/09/2021   Procedure: TRANSURETHRAL RESECTION OF BLADDER TUMOR (TURBT);  Surgeon: Remi Haggard, MD;  Location: [facility];  Service: Urology;  Laterality: N/A;   TRANSURETHRAL RESECTION OF BLADDER TUMOR WITH MITOMYCIN-C N/A 07/31/2021   Procedure: TRANSURETHRAL RESECTION OF BLADDER TUMOR WITH GEMCITABINE;  Surgeon: Remi Haggard, MD;  [facility];  Service: Urology;  Laterality: N/A;   TRANSURETHRAL RESECTION OF BLADDER TUMOR WITH MITOMYCIN-C N/A 11/20/2021   Procedure: TRANSURETHRAL RESECTION OF BLADDER TUMOR WITH POSTOPERATIVE  GEMCITABINE;  Surgeon: Remi Haggard, MD;  Location: [facility];  Service: Urology;  Laterality: N/A;  30 MINS    Allergies: Hydrochlorothiazide, Flomax [tamsulosin], Codeine, Statins, and Zetia [ezetimibe]  Medications: Prior to Admission medications   Medication Sig Start Date End Date Taking? Authorizing Provider  acetaminophen (TYLENOL) 325 MG tablet Take 650 mg by mouth every 6 (six) hours as needed for mild pain.   Yes [provider]  BIOTIN PO Take 1 tablet by mouth daily.   Yes [provider]  Calcium Citrate-Vitamin D (CALCIUM CITRATE + D3 PO) Take 1 tablet by mouth 2 (two) times daily.   Yes [provider]  carboxymethylcellulose (REFRESH PLUS) 0.5 % SOLN  Place 1 drop into both eyes daily as needed (dry eyes).   Yes [provider]  cetirizine (ZYRTEC) 10 MG tablet Take 10 mg by mouth daily as needed for allergies.   Yes [provider]  cyanocobalamin 1000 MCG tablet Take 1,000 mcg by mouth daily. Vitamin b 12   Yes [provider]  furosemide (LASIX) 20 MG tablet Take 1 tablet (20 mg total) by mouth daily. 01/28/22  Yes Martinique, Betty G, MD  ibuprofen (ADVIL) 200 MG tablet Take 800 mg by mouth every 6 (six) hours as needed for mild pain.   Yes [provider]  losartan (COZAAR) 100 MG tablet Take 1 tablet (100 mg total) by mouth daily. 02/08/22  Yes [place], Betty G, MD  meclizine (ANTIVERT) 25 MG tablet TAKE 1 TABLET BY MOUTH 3  TIMES DAILY AS NEEDED FOR  DIZZINESS Patient taking differently: Take 25 mg by mouth 3 (three) times daily as needed for dizziness. 10/15/21  Yes [place], Betty G, MD  Menthol, Topical Analgesic, (BIOFREEZE ROLL-ON EX) Apply 1 Application topically 2 (two) times daily as needed (pain).   Yes [provider]  pantoprazole (  PROTONIX) 40 MG tablet Take 1 tablet (40 mg total) by mouth daily. Patient taking differently: Take 40 mg by mouth at bedtime. 10/15/21  Yes [place], Betty G, MD  polyethylene glycol (MIRALAX / GLYCOLAX) 17 g packet Take 17 g by mouth daily as needed for mild constipation.   Yes [provider]  potassium chloride SA (KLOR-CON M) 20 MEQ tablet TAKE 1 TABLET BY MOUTH  DAILY Patient taking differently: 20 mEq at bedtime. TAKE 1 TABLET BY MOUTH  DAILY 04/04/21  Yes [place], Betty G, MD  Probiotic Product (ALIGN PO) Take 1 tablet by mouth daily.    Yes [provider]  rosuvastatin (CRESTOR) 10 MG tablet 2 to 3 x week in am Patient taking differently: Take 10 mg by mouth 2 (two) times a week. 2 to 3 x week in am 10/09/21  Yes [place], Betty G, MD  UNABLE TO FIND Flucinonide usp 0.05 % cream prn for eczema   Yes [provider]     Family History   Problem Relation Age of Onset   Alzheimer's disease Mother    Breast cancer Sister 83   Colon polyps Sister    Cancer Son        prostate   Anesthesia problems Neg Hx     Social History   Socioeconomic History   Marital status: Widowed    Spouse name: Not on file   Number of children: Not on file   Years of education: Not on file   Highest education level: Not on file  Occupational History   Not on file  Tobacco Use   Smoking status: Former    Packs/day: 1.00    Years: 15.00    Total pack years: 15.00    Types: Cigarettes    Quit date: 09/20/1998    Years since quitting: 23.4   Smokeless tobacco: Never  Vaping Use   Vaping Use: Never used  Substance and Sexual Activity   Alcohol use: Not Currently   Drug use: Never   Sexual activity: Not Currently    Birth control/protection: Surgical    Comment: 1st intercourse 76 yo-Fewer than 5 partners, hysterectomy  Other Topics Concern   Not on file  Social History Narrative   Widowed- husband died of Alzheimers in 2013-02-01.   Social Determinants of Health   Financial Resource Strain: Low Risk  (10/08/2021)   Overall Financial Resource Strain (CARDIA)    Difficulty of Paying Living Expenses: Not hard at all  Food Insecurity: No Food Insecurity (02/17/2022)   Hunger Vital Sign    Worried About Running Out of Food in the Last Year: Never true    Ran Out of Food in the Last Year: Never true  Transportation Needs: No Transportation Needs (02/17/2022)   PRAPARE - Hydrologist (Medical): No    Lack of Transportation (Non-Medical): No  Physical Activity: Inactive (10/08/2021)   Exercise Vital Sign    Days of Exercise per Week: 0 days    Minutes of Exercise per Session: 0 min  Stress: No Stress Concern Present (10/08/2021)   [place]    Feeling of Stress : Not at all  Social Connections: Moderately Isolated (10/03/2020)   Social  Connection and Isolation Panel [NHANES]    Frequency of Communication with Friends and Family: Twice a week    Frequency of Social Gatherings with Friends and Family: Twice a week    Attends Religious Services:  More than 4 times per year    Active Member of Clubs or Organizations: No    Attends Archivist Meetings: Never    Marital Status: Widowed    Review of Systems: A 12 point ROS discussed and pertinent positives are indicated in the HPI above.  All other systems are negative.  Vital Signs: BP 125/78 (BP Location: Right Arm)   Pulse 70   Temp 98.6 F (37 C) (Oral)   Resp 17   Ht 5' 7.5" (1.715 m)   Wt 198 lb 3.1 oz (89.9 kg)   SpO2 96%   BMI 30.58 kg/m   Physical Exam Constitutional:      Appearance: [name] is ill-appearing.  HENT:     Head: Normocephalic and atraumatic.  Cardiovascular:     Rate and Rhythm: Normal rate and regular rhythm.  Pulmonary:     Effort: Pulmonary effort is normal.     Breath sounds: Normal breath sounds.  Abdominal:     Palpations: Abdomen is soft.     Tenderness: There is no abdominal tenderness.  Skin:    General: Skin is warm and dry.  Neurological:     General: No focal deficit present.     Mental Status: [name] is alert.  Psychiatric:        Mood and Affect: Mood normal.        Behavior: Behavior normal.     Imaging: CT BONE TROCAR/NEEDLE BIOPSY SUPERFICIAL  Result Date: 02/18/2022 INDICATION: 76 year old female referred for biopsy of right chest wall mass EXAM: CT-GUIDED BIOPSY RIGHT CHEST WALL MASS MEDICATIONS: None. ANESTHESIA/SEDATION: Moderate (conscious) sedation was not employed during this procedure. Moderate Sedation Time: 0 minutes. The patient's level of consciousness and vital signs were monitored continuously by radiology nursing throughout the procedure under my direct supervision. FLUOROSCOPY TIME:  CT COMPLICATIONS: None PROCEDURE: Informed written consent was obtained from the patient after a thorough  discussion of the procedural risks, benefits and alternatives. All questions were addressed. Maximal Sterile Barrier Technique was utilized including caps, mask, sterile gowns, sterile gloves, sterile drape, hand hygiene and skin antiseptic. A timeout was performed prior to the initiation of the procedure. Patient positioned supine on the CT gantry table. Scout CT was acquired for planning purposes. The right chest wall was prepped with chlorhexidine in a sterile fashion, and a sterile drape was applied covering the operative field. A sterile gown and sterile gloves were used for the procedure. Local anesthesia was provided with 1% Lidocaine. CT guidance was used for placement of a guide needle into the right chest wall mass, with soft tissue replacing right seventh rib. Needle was tangential to the rib, directed superficially. Multiple 18 gauge core biopsy then acquired in placed into formalin. Needle was removed and CT was performed at 5 minutes and 10 minutes to observe a small hematoma which did not enlarge over time. Patient tolerated the procedure well and remained hemodynamically stable throughout. No complications were encountered and no significant blood loss was encounter IMPRESSION: Status post CT-guided biopsy of soft tissue mass replacing the right anterior seventh rib Signed, Dulcy Fanny. Nadene Rubins, RPVI [facility] Electronically Signed   By: Corrie Mckusick D.O.   On: 02/18/2022 13:03   MR TOTAL SPINE METS SCREENING  Result Date: 02/18/2022 CLINICAL DATA:  Lung mass, evaluate for metastatic disease. EXAM: MRI TOTAL SPINE WITHOUT AND WITH CONTRAST TECHNIQUE: Multisequence MR imaging of the spine from the cervical spine to the sacrum was  performed prior to and following IV contrast administration for evaluation of spinal metastatic disease. CONTRAST:  25mL GADAVIST GADOBUTROL 1 MMOL/ML IV SOLN COMPARISON:  No prior MRI of the spine  available, correlation is made with CT chest abdomen pelvis 02/16/2022 FINDINGS: Vertebrae: Multifocal areas of decreased T1 signal, increased T2 signal, and contrast enhancement are seen throughout the spine, ribs, and sacrum. The most prominently involved vertebral body is T7, where abnormal signal makes up the majority of the vertebral body (series 25, image 9) with an additional focus in the spinous process (series 25, image 8). No evidence of pathologic fracture. Additional smaller foci of abnormal signal are seen in C2 (series 25, images 8 and 10), C7 body and inferior articular facet (series 25, images 8, 10, and 14), T1 (series 25, image 9), T2 inferior articular facet and rib (series 25, images 1 and 10), third rib (series 25, image 14), T4 body and spinous process (series 25, images 9 and 10), T5 pedicle (series 25, image 6), T8 (series 25, images 6 and 11), T9 body and rib (series 25, images 1 and 6), T10 (series 26, images 6, 9, and 12), T11 (series 26, images 7 and 8), T12 (series 26, images 6-8, 11- 13), L1 (series 26, image 12), L2 (series 26, images 6-11), L3 (series 26, images 7, 8, 10, and 12), L4 inferior articular facet, pedicle, and transverse process (series 26, images 4, 14, and 26), L5 (series 26, image 8), and the sacrum and iliac bones (series 26, images 1-3, 11-13, 16, and 19). No definite posterior cortical breakthrough and epidural extension of tumor. The posterior cortex T7, T11, and T12 (series 25, image 8 and series 26, image 8), appears intact. No high-grade spinal canal stenosis. Segmentation: 5 lumbar-type vertebral bodies. Alignment: Physiologic. Cord:  Normal morphology and signal.  No abnormal enhancement. IMPRESSION: 1. Multifocal osseous metastatic disease throughout the spine, ribs, and sacrum and iliac bones, as described above. No evidence of pathologic fracture. 2. No definite epidural extension of tumor. The axial images that were purportedly obtained with this scan were  not transmitted at the time of interpretation, and multiple attempts to request the sequences went unanswered. If there is persistent concern, an addendum could be made when these images are transmitted. Electronically Signed   By: Merilyn Baba M.D.   On: 02/18/2022 02:23   MR BRAIN W WO CONTRAST  Result Date: 02/18/2022 CLINICAL DATA:  Lung cancer, staging EXAM: MRI HEAD WITHOUT AND WITH CONTRAST TECHNIQUE: Multiplanar, multiecho pulse sequences of the brain and surrounding structures were obtained without and with intravenous contrast. CONTRAST:  36mL GADAVIST GADOBUTROL 1 MMOL/ML IV SOLN COMPARISON:  07/14/2018 MRI head FINDINGS: Brain: No restricted diffusion to suggest acute or subacute infarct. No abnormal parenchymal or meningeal enhancement. No acute hemorrhage, mass, mass effect, or midline shift. No hydrocephalus or extra-axial collection. No hemosiderin deposition to suggest remote hemorrhage. Scattered T2 hyperintense signal in the periventricular white matter, likely the sequela of mild chronic small vessel ischemic disease. Vascular: Normal arterial flow voids. Normal arterial and venous enhancement. Skull and upper cervical spine: Enhancing osseous lesions in the left parietal bone (series 16, images 79 and 136), right parietal bone (series 16, image 136), right frontal bone (series 16, image 118), and the head of the left mandible (series 16, image 20). Sinuses/Orbits: No acute finding. Status post bilateral lens replacements. Other: Trace fluid in the bilateral mastoid air cells. IMPRESSION: 1. Enhancing osseous lesions in the left parietal bone, right parietal bone,  right frontal bone, and head of the left mandible, concerning for osseous metastatic disease. 2. No evidence of intracranial metastatic disease. Electronically Signed   By: Merilyn Baba M.D.   On: 02/18/2022 01:28   CT Chest Wo Contrast  Result Date: 02/16/2022 CLINICAL DATA:  Lung nodule, > 69mm.  Dyspnea EXAM: CT CHEST  WITHOUT CONTRAST TECHNIQUE: Multidetector CT imaging of the chest was performed following the standard protocol without IV contrast. RADIATION DOSE REDUCTION: This exam was performed according to the departmental dose-optimization program which includes automated exposure control, adjustment of the mA and/or kV according to patient size and/or use of iterative reconstruction technique. COMPARISON:  None Available. FINDINGS: Cardiovascular: Moderate multi-vessel coronary artery calcification. Global cardiac size is within normal limits. No pericardial effusion. Central pulmonary arteries are of normal caliber. Mild atherosclerotic calcification within the thoracic aorta. No aortic aneurysm. Mediastinum/Nodes: No enlarged mediastinal or axillary lymph nodes. Thyroid gland, trachea, and esophagus demonstrate no significant findings. Lungs/Pleura: The opacity seen on recent chest radiograph corresponds to a lobulated soft tissue mass within the a anterior left upper lobe measuring 2.9 x 4.1 cm at axial image # 43/2, suspicious for a primary bronchogenic neoplasm. The mass abuts the pleural surface but does not clearly transgress into the anterior mediastinum. There are innumerable micro nodules seen distributed randomly throughout the lungs bilaterally which appear new since the visualized lung bases on examination of 11/18/2017 and are suspicious for intra pulmonary miliary metastatic disease. Small bilateral pleural effusions are present. No pneumothorax. No central obstructing lesion. Upper Abdomen: No acute abnormality. Musculoskeletal: There are erosive changes involving the right eighth rib anteriorly and left seventh ribs anterolaterally in keeping with osseous metastatic disease. There are, additionally, subtle lytic lesions seen throughout the thoracic spine, best appreciated within the T10 and T11 vertebral bodies also suspicious osseous metastatic disease. There is a acute appearing fracture of the right  seventh rib laterally with associated subpleural thickening likely representing edema. No associated soft tissue mass or erosion is identified and this may simply represent a posttraumatic fracture. Left mastectomy and axillary lymph node dissection has been performed. IMPRESSION: 1. 4.1 cm left upper lobe pulmonary mass suspicious for a primary bronchogenic neoplasm. Innumerable micro nodules distributed randomly throughout the lungs bilaterally suspicious for intra pulmonary miliary metastatic disease. PET CT examination may be helpful to direct biopsy strategy, if indicated. 2. Osseous metastatic disease with erosive changes involving the right eighth rib anteriorly and left seventh ribs anterolaterally. Additional subtle lytic lesions seen throughout the thoracic spine, best appreciated within the T10 and T11 vertebral bodies also suspicious for osseous metastatic disease. This would be better assessed with contrast enhanced MRI examination if indicated. 3. Acute appearing fracture of the right seventh rib laterally with associated subpleural thickening likely representing edema. No associated soft tissue mass or erosion is identified and this may simply represent a posttraumatic fracture. 4. Moderate multi-vessel coronary artery calcification. 5. Small bilateral pleural effusions. 6. Left mastectomy and axillary lymph node dissection. Aortic Atherosclerosis (ICD10-I70.0). Electronically Signed   By: Fidela Salisbury M.D.   On: 02/16/2022 20:22   DG Chest 1 View  Result Date: 02/16/2022 CLINICAL DATA:  Upper abdominal pain.  Shortness of breath. EXAM: CHEST  1 VIEW COMPARISON:  09/23/2011 FINDINGS: Asymmetric elevation left hemidiaphragm. Right lung is clear. Focal opacity identified in the medial left upper lobe. The cardiopericardial silhouette is within normal limits for size. The visualized bony structures of the thorax are unremarkable. Telemetry leads overlie the chest. IMPRESSION: Focal  masslike  opacity in the medial left upper lobe. This could potentially be pneumonia, but imaging features are suspicious for soft tissue mass lesion. CT chest recommended to further evaluate. Electronically Signed   By: Misty Stanley M.D.   On: 02/16/2022 18:44   CT ABDOMEN PELVIS W CONTRAST  Result Date: 02/16/2022 CLINICAL DATA:  Acute abdominal pain. EXAM: CT ABDOMEN AND PELVIS WITH CONTRAST TECHNIQUE: Multidetector CT imaging of the abdomen and pelvis was performed using the standard protocol following bolus administration of intravenous contrast. RADIATION DOSE REDUCTION: This exam was performed according to the departmental dose-optimization program which includes automated exposure control, adjustment of the mA and/or kV according to patient size and/or use of iterative reconstruction technique. CONTRAST:  8mL OMNIPAQUE IOHEXOL 300 MG/ML  SOLN COMPARISON:  CT abdomen and pelvis 11/24/2020 FINDINGS: Lower chest: There are new innumerable bilateral pulmonary micro nodules throughout both lungs. There are new small bilateral pleural effusions. Hepatobiliary: Gallstones are present. There is no biliary ductal dilatation. The liver is within normal limits. Pancreas: Unremarkable. No pancreatic ductal dilatation or surrounding inflammatory changes. Spleen: Normal in size without focal abnormality. Adrenals/Urinary Tract: Adrenal glands are unremarkable. Kidneys are normal, without renal calculi, focal lesion, or hydronephrosis. Bladder is unremarkable. Stomach/Bowel: There is diffuse colonic diverticulosis without evidence for acute diverticulitis. Appendix is not visualized. No bowel obstruction, wall thickening, focal inflammation or free air. Stomach within normal limits. Vascular/Lymphatic: Aortic atherosclerosis. No enlarged abdominal or pelvic lymph nodes. Reproductive: Status post hysterectomy. No adnexal masses. Other: No ascites or free air. Surgical clips are seen in the left inguinal region. There is some  scarring in the left anterior abdominal wall. Musculoskeletal: There is expansile rib lesion with soft tissue component of the anterolateral right eighth rib fracture. This lesion measures 2.6 x 4.9 x 3.7 cm. There also small lytic lesions in the left anterolateral seventh rib. There also likely small lytic lesions throughout multiple lower thoracic vertebral bodies, particularly at T10 and T11. No acute fractures are seen. IMPRESSION: 1. New innumerable pulmonary micro nodules throughout both lungs worrisome for metastatic disease. 2. New small bilateral pleural effusions. 3. Expansile lytic lesion of the right eighth rib with soft tissue component worrisome for metastatic disease. There are additional lytic lesions in the left seventh rib and lower thoracic spine concerning for metastatic disease. 4. No acute localizing process in the abdomen or pelvis. 5. Cholelithiasis. 6. Colonic diverticulosis. Aortic Atherosclerosis (ICD10-I70.0). Electronically Signed   By: Ronney Asters M.D.   On: 02/16/2022 18:40    Labs:  CBC: Recent Labs    09/11/21 1615 02/08/22 1317 02/16/22 1418 02/17/22 0724  WBC 4.8 5.5 5.9 4.9  HGB 12.9 12.3 12.2 10.1*  HCT 41.0 38.7 39.3 32.6*  PLT 231.0 207.0 235 210    COAGS: Recent Labs    02/18/22 0507  INR 1.0    BMP: Recent Labs    10/09/21 1517 02/08/22 1317 02/16/22 1418 02/17/22 0724  NA 140 142 142 137  K 3.8 3.8 3.9 3.8  CL 105 106 104 105  CO2 23 25 23 28   GLUCOSE 92 97 96 114*  BUN 7 14 6* 9  CALCIUM 10.8* 10.9* 11.0* 9.8  CREATININE 0.78 0.91 0.71 0.61  GFRNONAA  --   --  >60 >60    LIVER FUNCTION TESTS: Recent Labs    04/02/21 1223 09/11/21 1615 02/08/22 1317 02/16/22 1418  BILITOT 0.5 0.3 0.4 0.5  AST 13 13 15  14*  ALT 6 7 6  <5  ALKPHOS 100 93 98 113  PROT 7.2 7.0 7.2 7.3  ALBUMIN 4.6 4.5 4.5 4.5    TUMOR MARKERS: No results for input(s): "AFPTM", "CEA", "CA199", "CHROMGRNA" in the last 8760 hours.  Assessment and  Plan:  Metastatic disease --plan to begin radiation and chemotherapies. --Port needed for chemotherapy --tentatively planned for placement tomorrow, 11/1. --NPO @ [place] order placed.  Risks and benefits of image guided port-a-catheter placement was discussed with the patient including, but not limited to bleeding, infection, pneumothorax, or fibrin sheath development and need for additional procedures.  All of the patient's questions were answered, patient is agreeable to proceed. Consent signed and in chart.   Thank you for this interesting consult.  I greatly enjoyed [place] and look forward to participating in their care.  A copy of this report was sent to the requesting provider on this date.  Electronically Signed: Pasty Spillers, PA 02/19/2022, 2:00 PM   I spent a total of 20 Minutes in face to face in clinical consultation, greater than 50% of which was counseling/coordinating care for port placement.

## 2022-02-19 NOTE — Progress Notes (Signed)
PROGRESS NOTE  [name]  DOB: 05/30/1945  PCP: Martinique, Betty G, MD QBV:[id number]  DOA: 02/16/2022  LOS: 2 days  Hospital Day: 4  Brief narrative: [name] is a 76 y.o. female with PMH significant for HTN, HLD, CAD, history of breast cancer, history of bladder cancer.   10/28, patient presented to the ED with complaint of abdominal pain, back pain, rib pain ongoing for last 2 months not relieved with Tylenol, ibuprofen.  Also reported worsening appetite. Work-up in the ED showed CT evidence of primary lung mass, pulmonary metastatic disease, bone mets and ribs fracture.   EDP discussed the case with Dr. Alvy Bimler, who recommended admission for pain control and Decadron.   Admitted to [facility]  Subjective: Patient was seen and examined this morning.  Propped up in bed.  Not in distress.  Pain controlled.  Multiple family numbers at bedside.  Patient underwent simulation for radiation today  Assessment and plan: Metastatic cancer Patient presented with pain in the abdomen, ribs and back. CT scan on admission showed possible primary lung cancer with mets to lungs, bone and ribs Per oncologist Dr. Alvy Bimler, primary site could be lung cancer or recurrent breast cancer. 10/30, underwent biopsy of right chest wall mass by IR. MRI brain showed no evidence of intracranial metastasis but bony metastasis noted in multiple areas in the skull. MRI of the spine showed diffuse metastatic disease without evidence of pathological fracture. Port placement by IR planned.  Pain management For pain management, patient is currently on Decadron 4 mg 3 times daily as needed, Dilaudid as needed 3 mg every 4 hours, oxycodone 15 mg every 3 hours as needed. Bowel regimen with Senokot, MiraLAX.  History of breast cancer History of bladder cancer  Per oncology note, patient has s remote history of breast cancer and was treated with left mastectomy with reconstruction surgery, chemotherapy,  radiation therapy, as well as antiestrogen therapy.  The details of her chemotherapy is unknown.  Her prior oncologist has retired She also has history of superficial bladder cancer and had received intravesical treatment under the guidance of urologist.   Hyperlipidemia On Crestor   GERD Continue PPI   Essential hypertension PTA on Lasix 20 mg daily, losartan 100 mg daily. Currently both on hold.  Blood pressure soft.  Continue to monitor.   Goals of care   Code Status: Full Code    Mobility: Encourage ambulation  Skin assessment:     Nutritional status:  Body mass index is 30.58 kg/m.          Diet:  Diet Order             Diet NPO time specified Except for: Sips with Meds  Diet effective midnight           Diet regular Room service appropriate? Yes; Fluid consistency: Thin  Diet effective now                   DVT prophylaxis:  enoxaparin (LOVENOX) injection 40 mg Start: 02/19/22 1000   Antimicrobials: None Fluid: NS at 75 mill per hour Consultants: Oncology, IR Family Communication: Sons   Status is: Inpatient  Continue in-hospital care because: Ongoing work-up for malignancy Level of care: Telemetry   Dispo: The patient is from: Home              Anticipated d/c is to: Hopefully home in 1 to 2 days  Patient currently is not medically stable to d/c.   Difficult to place patient No     Infusions:   sodium chloride Stopped (02/19/22 0113)    Scheduled Meds:  dexamethasone  4 mg Oral Q8H   enoxaparin (LOVENOX) injection  40 mg Subcutaneous Q24H    HYDROmorphone (DILAUDID) injection  1 mg Intravenous Once   polyethylene glycol  17 g Oral Daily   senna-docusate  1 tablet Oral QHS    PRN meds: acetaminophen, alum & mag hydroxide-simeth, HYDROmorphone (DILAUDID) injection, lip balm, LORazepam, meclizine, melatonin, oxyCODONE, prochlorperazine   Antimicrobials: Anti-infectives (From admission, onward)    None        Objective: Vitals:   02/19/22 0814 02/19/22 0859  BP:    Pulse:    Resp: 20 17  Temp:    SpO2:      Intake/Output Summary (Last 24 hours) at 02/19/2022 1341 Last data filed at 02/19/2022 0423 Gross per 24 hour  Intake 1025.66 ml  Output --  Net 1025.66 ml   Filed Weights   02/16/22 1411  Weight: 89.9 kg   Weight change:  Body mass index is 30.58 kg/m.   Physical Exam: General exam: Pain controlled Skin: No rashes, lesions or ulcers. HEENT: Atraumatic, normocephalic, Lungs: Clear to auscultation bilaterally CVS: Rate and rhythm regular, no murmur GI/Abd soft, nontender, nondistended, bowel sound present CNS: Alert, awake, oriented x3 Psychiatry: Sad affect Extremities: No pedal edema, no calf tenderness  Data Review: I have personally reviewed the laboratory data and studies available.  F/u labs ordered Unresulted Labs (From admission, onward)     Start     Ordered   02/24/22 0500  Creatinine, serum  (enoxaparin (LOVENOX)    CrCl >/= 30 ml/min)  Weekly,   R     Comments: while on enoxaparin therapy    02/17/22 0032            Signed, Terrilee Croak, MD [facility] 02/19/2022

## 2022-02-19 NOTE — Progress Notes (Signed)
  Radiation Oncology         [phone] ________________________________  Name: [name] MRN: [id number]  Date: 02/19/2022  DOB: 12-21-1945  INPATIENT  SIMULATION AND TREATMENT PLANNING NOTE    ICD-10-CM   1. Metastatic cancer to bone [facility])  C79.51       DIAGNOSIS:   76 y.o. woman with painful bone metastases from stage IV cancer  NARRATIVE:  The patient was brought to the [facility].  Identity was confirmed.  All relevant records and images related to the planned course of therapy were reviewed.  The patient freely provided informed written consent to proceed with treatment after reviewing the details related to the planned course of therapy. The consent form was witnessed and verified by the simulation staff.  Then, the patient was set-up in a stable reproducible  supine position for radiation therapy.  CT images were obtained.  Surface markings were placed.  The CT images were loaded into the planning software.  Then the target and avoidance structures were contoured.  Treatment planning then occurred.  The radiation prescription was entered and confirmed.  Then, I designed and supervised the construction of a total of multiple medically necessary complex treatment devices documented in the [place] treatment planning note.  I have requested : 3D Simulation  I have requested a DVH of the following structures: lungs, spinal cord, heart and targets.  PLAN:  The patient will receive 30 Gy in 10 fractions starting tomorrow, to the right chest wall, T7 and L-spine.  ________________________________  Sheral Apley. Tammi Klippel, M.D.

## 2022-02-20 ENCOUNTER — Inpatient Hospital Stay (HOSPITAL_COMMUNITY): Payer: Medicare Other

## 2022-02-20 ENCOUNTER — Ambulatory Visit
Admission: RE | Admit: 2022-02-20 | Discharge: 2022-02-20 | Disposition: A | Discharge status: Home / Self Care | Payer: Medicare Other | Source: Ambulatory Visit | Attending: Radiation Oncology | Admitting: Radiation Oncology

## 2022-02-20 ENCOUNTER — Other Ambulatory Visit: Payer: Self-pay

## 2022-02-20 DIAGNOSIS — C7951 Secondary malignant neoplasm of bone: Secondary | ICD-10-CM | POA: Insufficient documentation

## 2022-02-20 DIAGNOSIS — C799 Secondary malignant neoplasm of unspecified site: Secondary | ICD-10-CM | POA: Diagnosis not present

## 2022-02-20 LAB — RAD ONC ARIA SESSION SUMMARY

## 2022-02-20 MED ORDER — DOCUSATE SODIUM 100 MG PO CAPS
100.0000 mg | ORAL_CAPSULE | Freq: Two times a day (BID) | ORAL | Status: DC
  Administered 2022-02-20 – 2022-02-22 (×4): 100 mg via ORAL
  Filled 2022-02-20 (×4): qty 1

## 2022-02-20 MED ORDER — TRAZODONE HCL 50 MG PO TABS
50.0000 mg | ORAL_TABLET | Freq: Every evening | ORAL | Status: DC | PRN
  Administered 2022-02-21: 50 mg via ORAL
  Filled 2022-02-20: qty 1

## 2022-02-20 MED ORDER — IPRATROPIUM-ALBUTEROL 0.5-2.5 (3) MG/3ML IN SOLN: 3.0000 mL | RESPIRATORY_TRACT | Status: DC | PRN

## 2022-02-20 MED ORDER — BIOTIN 10 MG PO TABS: ORAL_TABLET | Freq: Every day | ORAL | Status: DC

## 2022-02-20 MED ORDER — MIDAZOLAM HCL 2 MG/2ML IJ SOLN
INTRAMUSCULAR | Status: AC
  Filled 2022-02-20: qty 4

## 2022-02-20 MED ORDER — HYDRALAZINE HCL 20 MG/ML IJ SOLN: 10.0000 mg | INTRAMUSCULAR | Status: DC | PRN

## 2022-02-20 MED ORDER — VITAMIN B-12 1000 MCG PO TABS
1000.0000 ug | ORAL_TABLET | Freq: Every day | ORAL | Status: DC
  Administered 2022-02-21 – 2022-02-22 (×2): 1000 ug via ORAL
  Filled 2022-02-20 (×2): qty 1

## 2022-02-20 MED ORDER — SODIUM CHLORIDE 0.9 % IV SOLN: INTRAVENOUS | Status: AC

## 2022-02-20 MED ORDER — PANTOPRAZOLE SODIUM 40 MG PO TBEC
40.0000 mg | DELAYED_RELEASE_TABLET | Freq: Every day | ORAL | Status: DC
  Administered 2022-02-20 – 2022-02-21 (×2): 40 mg via ORAL
  Filled 2022-02-20 (×2): qty 1

## 2022-02-20 MED ORDER — ONDANSETRON HCL 4 MG/2ML IJ SOLN: 4.0000 mg | Freq: Four times a day (QID) | INTRAMUSCULAR | Status: DC | PRN

## 2022-02-20 MED ORDER — GUAIFENESIN 100 MG/5ML PO LIQD: 5.0000 mL | ORAL | Status: DC | PRN

## 2022-02-20 MED ORDER — METOPROLOL TARTRATE 5 MG/5ML IV SOLN: 5.0000 mg | INTRAVENOUS | Status: DC | PRN

## 2022-02-20 MED ORDER — FENTANYL CITRATE (PF) 100 MCG/2ML IJ SOLN
INTRAMUSCULAR | Status: AC
  Filled 2022-02-20: qty 2

## 2022-02-20 MED ORDER — LORATADINE 10 MG PO TABS: 10.0000 mg | ORAL_TABLET | Freq: Every day | ORAL | Status: DC | PRN

## 2022-02-20 MED ORDER — OYSTER SHELL CALCIUM/D3 500-5 MG-MCG PO TABS
1.0000 | ORAL_TABLET | Freq: Two times a day (BID) | ORAL | Status: DC
  Administered 2022-02-20 – 2022-02-22 (×3): 1 via ORAL
  Filled 2022-02-20 (×3): qty 1

## 2022-02-20 NOTE — Progress Notes (Signed)
PROGRESS NOTE    [name]  KNL:[id number] DOB: November 07, 1945 DOA: 02/16/2022 PCP: Martinique, Betty G, MD   Brief Narrative:  76 y.o. female with PMH significant for HTN, HLD, CAD, history of breast cancer, history of bladder cancer.  10/28, patient presented to the ED with complaint of abdominal pain, back pain, rib pain ongoing for last 2 months not relieved with Tylenol, ibuprofen.  Also reported worsening appetite. Work-up in the ED showed CT evidence of primary lung mass, pulmonary metastatic disease, bone mets and ribs fracture. EDP discussed the case with Dr. Alvy Bimler, who recommended admission for pain control and Decadron.     Assessment & Plan:  Principal Problem:   Metastatic disease ([place]) Active Problems:   Metastatic cancer to bone [facility])   Metastatic cancer At this time there is no clear primary as patient does have history of breast cancer, recently treated bladder cancer and new lung nodule with bone lesions.  MRI brain does not show any intracranial metastases but MRI spine does show metastatic disease with no evidence of any pathologic fracture at this time.  Bone marrow biopsy was performed on October 30, results of which are pending at this time.  Oncology and radiation oncology are following.  IR consulted for Chemo-Port placement. Radiation treatment to bone starting today per radiation oncology   Pain management For pain management, patient is currently on Decadron 4 mg 3 times daily as needed, Dilaudid as needed 3 mg every 4 hours, oxycodone 15 mg every 3 hours as needed.  We will consult palliative care for pain management as patient will also be eventually need to be followed outpatient Bowel regimen with Senokot, MiraLAX.   History of breast cancer History of bladder cancer  We will continue to follow oncology   Hyperlipidemia On Crestor    Essential hypertension PTA on Lasix 20 mg daily, losartan 100 mg daily. Currently both on hold.  Blood pressure soft.   Continue to monitor..  IV as needed ordered     DVT prophylaxis: Lovenox Code Status: Full Family Communication: Sister at bedside  Status is: Inpatient Ongoing treatment for cancer care.    Subjective: Seen and examined at bedside, denies any pain at this time.  Tells me it is overall well controlled.  Currently she is awaiting bone marrow biopsy result   Examination:  General exam: Appears calm and comfortable  Respiratory system: Clear to auscultation. Respiratory effort normal. Cardiovascular system: S1 & S2 heard, RRR. No JVD, murmurs, rubs, gallops or clicks. No pedal edema. Gastrointestinal system: Abdomen is nondistended, soft and nontender. No organomegaly or masses felt. Normal bowel sounds heard. Central nervous system: Alert and oriented. No focal neurological deficits. Extremities: Symmetric 5 x 5 power. Skin: No rashes, lesions or ulcers Psychiatry: Judgement and insight appear normal. Mood & affect appropriate.     Objective: Vitals:   02/19/22 1715 02/19/22 1800 02/19/22 2027 02/20/22 0617  BP:   (!) 148/68 (!) 176/76  Pulse:   (!) 58 (!) 57  Resp: _0 Temp:   98.4 F (36.9 C) 98 F (36.7 C)  TempSrc:   Oral Oral  SpO2:   99% 94%  Weight:      Height:       No intake or output data in the 24 hours ending 02/20/22 0929 Filed Weights   02/16/22 1411  Weight: 89.9 kg     Data Reviewed:   CBC: Recent Labs  Lab 02/16/22 1418 02/17/22 0724  WBC  5.9 4.9  HGB 12.2 10.1*  HCT 39.3 32.6*  MCV 83.6 84.7  PLT 235 382   Basic Metabolic Panel: Recent Labs  Lab 02/16/22 1418 02/17/22 0724  NA 142 137  K 3.9 3.8  CL 104 105  CO2 23 28  GLUCOSE 96 114*  BUN 6* 9  CREATININE 0.71 0.61  CALCIUM 11.0* 9.8  MG  --  2.0  PHOS  --  3.0   GFR: Estimated Creatinine Clearance: 69.5 mL/min (by C-G formula based on SCr of 0.61 mg/dL). Liver Function Tests: Recent Labs  Lab 02/16/22 1418  AST 14*  ALT <5  ALKPHOS 113  BILITOT 0.5   PROT 7.3  ALBUMIN 4.5   Recent Labs  Lab 02/16/22 1418  LIPASE <10*   No results for input(s): "AMMONIA" in the last 168 hours. Coagulation Profile: Recent Labs  Lab 02/18/22 0507  INR 1.0   Cardiac Enzymes: No results for input(s): "CKTOTAL", "CKMB", "CKMBINDEX", "TROPONINI" in the last 168 hours. BNP (last 3 results) No results for input(s): "PROBNP" in the last 8760 hours. HbA1C: No results for input(s): "HGBA1C" in the last 72 hours. CBG: No results for input(s): "GLUCAP" in the last 168 hours. Lipid Profile: No results for input(s): "CHOL", "HDL", "LDLCALC", "TRIG", "CHOLHDL", "LDLDIRECT" in the last 72 hours. Thyroid Function Tests: No results for input(s): "TSH", "T4TOTAL", "FREET4", "T3FREE", "THYROIDAB" in the last 72 hours. Anemia Panel: No results for input(s): "VITAMINB12", "FOLATE", "FERRITIN", "TIBC", "IRON", "RETICCTPCT" in the last 72 hours. Sepsis Labs: No results for input(s): "PROCALCITON", "LATICACIDVEN" in the last 168 hours.  No results found for this or any previous visit (from the past 240 hour(s)).       Radiology Studies: CT BONE TROCAR/NEEDLE BIOPSY SUPERFICIAL  Result Date: 02/18/2022 INDICATION: 76 year old female referred for biopsy of right chest wall mass EXAM: CT-GUIDED BIOPSY RIGHT CHEST WALL MASS MEDICATIONS: None. ANESTHESIA/SEDATION: Moderate (conscious) sedation was not employed during this procedure. Moderate Sedation Time: 0 minutes. The patient's level of consciousness and vital signs were monitored continuously by radiology nursing throughout the procedure under my direct supervision. FLUOROSCOPY TIME:  CT COMPLICATIONS: None PROCEDURE: Informed written consent was obtained from the patient after a thorough discussion of the procedural risks, benefits and alternatives. All questions were addressed. Maximal Sterile Barrier Technique was utilized including caps, mask, sterile gowns, sterile gloves, sterile drape, hand hygiene and  skin antiseptic. A timeout was performed prior to the initiation of the procedure. Patient positioned supine on the CT gantry table. Scout CT was acquired for planning purposes. The right chest wall was prepped with chlorhexidine in a sterile fashion, and a sterile drape was applied covering the operative field. A sterile gown and sterile gloves were used for the procedure. Local anesthesia was provided with 1% Lidocaine. CT guidance was used for placement of a guide needle into the right chest wall mass, with soft tissue replacing right seventh rib. Needle was tangential to the rib, directed superficially. Multiple 18 gauge core biopsy then acquired in placed into formalin. Needle was removed and CT was performed at 5 minutes and 10 minutes to observe a small hematoma which did not enlarge over time. Patient tolerated the procedure well and remained hemodynamically stable throughout. No complications were encountered and no significant blood loss was encounter IMPRESSION: Status post CT-guided biopsy of soft tissue mass replacing the right anterior seventh rib Signed, Dulcy Fanny. Nadene Rubins, [facility] Electronically Signed   By: Corrie Mckusick  D.O.   On: 02/18/2022 13:03        Scheduled Meds:  dexamethasone  4 mg Oral Q8H   enoxaparin (LOVENOX) injection  40 mg Subcutaneous Q24H    HYDROmorphone (DILAUDID) injection  1 mg Intravenous Once   polyethylene glycol  17 g Oral Daily   senna-docusate  1 tablet Oral QHS   Continuous Infusions:   LOS: 3 days   Time spent= 35 mins    [name] Arsenio Loader, MD [facility]  If 7PM-7AM, please contact night-coverage  02/20/2022, 9:29 AM

## 2022-02-20 NOTE — Plan of Care (Signed)

## 2022-02-20 NOTE — Plan of Care (Signed)
  Problem: Education: Goal: Knowledge of General Education information will improve Description Including pain rating scale, medication(s)/side effects and non-pharmacologic comfort measures Outcome: Progressing   Problem: Health Behavior/Discharge Planning: Goal: Ability to manage health-related needs will improve Outcome: Progressing   

## 2022-02-20 NOTE — Progress Notes (Signed)
PHARMACIST - PHYSICIAN ORDER COMMUNICATION  CONCERNING: P&T Medication Policy on Herbal Medications  DESCRIPTION:  This patient's order for: Biotin has been noted.  This product(s) is classified as an "herbal" or natural product. Due to a lack of definitive safety studies or FDA approval, nonstandard manufacturing practices, plus the potential risk of unknown drug-drug interactions while on inpatient medications, the Pharmacy and Therapeutics Committee does not permit the use of "herbal" or natural products of this type within [facility].   ACTION TAKEN: The pharmacy department is unable to verify this order at this time and your patient has been informed of this safety policy. Please reevaluate patient's clinical condition at discharge and address if the herbal or natural product(s) should be resumed at that time.   Lindell Spar, PharmD, BCPS Clinical Pharmacist 02/20/2022 5:09 PM

## 2022-02-20 NOTE — Care Management Important Message (Signed)
Important Message  Patient Details IM Letter given. Name: [name] MRN: [id number] Date of Birth: Oct 11, 1945   Medicare Important Message Given:  Yes     Kerin Salen 02/20/2022, 11:41 AM

## 2022-02-21 ENCOUNTER — Inpatient Hospital Stay (HOSPITAL_COMMUNITY): Payer: Medicare Other

## 2022-02-21 ENCOUNTER — Other Ambulatory Visit: Payer: Self-pay

## 2022-02-21 ENCOUNTER — Ambulatory Visit
Admit: 2022-02-21 | Discharge: 2022-02-21 | Disposition: A | Discharge status: Home / Self Care | Payer: Medicare Other | Attending: Radiation Oncology | Admitting: Radiation Oncology

## 2022-02-21 DIAGNOSIS — Z7189 Other specified counseling: Secondary | ICD-10-CM

## 2022-02-21 DIAGNOSIS — R52 Pain, unspecified: Secondary | ICD-10-CM

## 2022-02-21 DIAGNOSIS — Z515 Encounter for palliative care: Secondary | ICD-10-CM | POA: Diagnosis not present

## 2022-02-21 DIAGNOSIS — C799 Secondary malignant neoplasm of unspecified site: Secondary | ICD-10-CM | POA: Diagnosis not present

## 2022-02-21 HISTORY — PX: IR IMAGING GUIDED PORT INSERTION: IMG5740

## 2022-02-21 LAB — RAD ONC ARIA SESSION SUMMARY

## 2022-02-21 LAB — BASIC METABOLIC PANEL
Anion gap: 8 (ref 5–15)
BUN: 17 mg/dL (ref 8–23)
CO2: 25 mmol/L (ref 22–32)
Calcium: 10.2 mg/dL (ref 8.9–10.3)
Chloride: 100 mmol/L (ref 98–111)
Creatinine, Ser: 0.72 mg/dL (ref 0.44–1.00)
GFR, Estimated: >60 mL/min (ref >60)
Glucose, Bld: 118 mg/dL — ABNORMAL HIGH (ref 70–99)
Potassium: 4.6 mmol/L (ref 3.5–5.1)
Sodium: 133 mmol/L — ABNORMAL LOW (ref 135–145)

## 2022-02-21 LAB — CBC
HCT: 35.8 % — ABNORMAL LOW (ref 36.0–46.0)
Hemoglobin: 11.1 g/dL — ABNORMAL LOW (ref 12.0–15.0)
MCH: 25.5 pg — ABNORMAL LOW (ref 26.0–34.0)
MCHC: 31 g/dL (ref 30.0–36.0)
MCV: 82.3 fL (ref 80.0–100.0)
Platelets: 251 10*3/uL (ref 150–400)
RBC: 4.35 MIL/uL (ref 3.87–5.11)
RDW: 13.3 % (ref 11.5–15.5)
WBC: 8.3 10*3/uL (ref 4.0–10.5)
nRBC: 0 % (ref 0.0–0.2)

## 2022-02-21 LAB — MAGNESIUM: Magnesium: 2.4 mg/dL (ref 1.7–2.4)

## 2022-02-21 MED ORDER — LIDOCAINE-EPINEPHRINE 1 %-1:100000 IJ SOLN
INTRAMUSCULAR | Status: AC | PRN
  Administered 2022-02-21: 10 mL via INTRADERMAL

## 2022-02-21 MED ORDER — MIDAZOLAM HCL 2 MG/2ML IJ SOLN
INTRAMUSCULAR | Status: AC
  Filled 2022-02-21: qty 2

## 2022-02-21 MED ORDER — HYDROMORPHONE HCL 1 MG/ML IJ SOLN
2.0000 mg | INTRAMUSCULAR | Status: DC | PRN
  Administered 2022-02-21 – 2022-02-22 (×4): 2 mg via INTRAVENOUS
  Filled 2022-02-21 (×5): qty 2

## 2022-02-21 MED ORDER — FENTANYL CITRATE (PF) 100 MCG/2ML IJ SOLN
INTRAMUSCULAR | Status: AC
  Filled 2022-02-21: qty 2

## 2022-02-21 MED ORDER — MIDAZOLAM HCL 2 MG/2ML IJ SOLN
INTRAMUSCULAR | Status: AC | PRN
  Administered 2022-02-21: 1 mg via INTRAVENOUS

## 2022-02-21 MED ORDER — LIDOCAINE-EPINEPHRINE 1 %-1:100000 IJ SOLN
INTRAMUSCULAR | Status: AC
  Filled 2022-02-21: qty 1

## 2022-02-21 MED ORDER — FENTANYL CITRATE (PF) 100 MCG/2ML IJ SOLN
INTRAMUSCULAR | Status: AC | PRN
  Administered 2022-02-21: 50 ug via INTRAVENOUS

## 2022-02-21 MED ORDER — MORPHINE SULFATE ER 30 MG PO TBCR
30.0000 mg | EXTENDED_RELEASE_TABLET | Freq: Two times a day (BID) | ORAL | Status: DC
  Administered 2022-02-21 – 2022-02-22 (×3): 30 mg via ORAL
  Filled 2022-02-21 (×3): qty 1

## 2022-02-21 MED ORDER — LOSARTAN POTASSIUM 50 MG PO TABS
100.0000 mg | ORAL_TABLET | Freq: Every day | ORAL | Status: DC
  Administered 2022-02-21 – 2022-02-22 (×2): 100 mg via ORAL
  Filled 2022-02-21 (×2): qty 2

## 2022-02-21 NOTE — Progress Notes (Signed)
PROGRESS NOTE  [place] XHB:[id number] DOB: Dec 22, 1945 DOA: 02/16/2022 PCP: [place], Betty G, MD   LOS: 4 days   Brief Narrative / Interim history: 76 y.o. [name] with PMH significant for HTN, HLD, CAD, history of breast cancer, history of bladder cancer.  10/28, patient presented to the ED with complaint of abdominal pain, back pain, rib pain ongoing for last 2 months not relieved with Tylenol, ibuprofen.  Also reported worsening appetite. Work-up in the ED showed CT evidence of primary lung mass, pulmonary metastatic disease, bone mets and ribs fracture. EDP discussed the case with Dr. Alvy Bimler, who recommended admission for pain control and Decadron.     Subjective / 24h Interval events: Her pain is about a 5.  No nausea or vomiting.  Assesement and Plan: Principal Problem:   Metastatic disease ([place]) Active Problems:   Metastatic cancer to bone [facility])   Principal problem Metastatic cancer -At this time there is no clear primary as patient does have history of breast cancer, recently treated bladder cancer and new lung nodule with bone lesions.  MRI brain does not show any intracranial metastases but MRI spine does show metastatic disease with no evidence of any pathologic fracture at this time.  Bone marrow biopsy was performed on October 30, results of which are pending at this time.  Oncology and radiation oncology are following.  IR consulted for Chemo-Port placement.  Discussed with Dr. Alvy Bimler this morning, once port is placed and pain control she could go home.  She started radiation treatment to bone  Active problems  Pain management -palliative consulted, appreciate input.  Continue bowel regimen   History of breast cancer,  History of bladder cancer -Will continue to follow oncology   Hyperlipidemia -On Crestor    Essential hypertension -PTA on Lasix 20 mg daily, losartan 100 mg daily.  They were initially on hold due to soft blood pressures, resume losartan today as  she is more hypertensive.  Anemia-chronic disease  Scheduled Meds:  calcium-vitamin D  1 tablet Oral BID   cyanocobalamin  1,000 mcg Oral Daily   dexamethasone  4 mg Oral Q8H   docusate sodium  100 mg Oral BID   enoxaparin (LOVENOX) injection  40 mg Subcutaneous Q24H    HYDROmorphone (DILAUDID) injection  1 mg Intravenous Once   morphine  30 mg Oral Q12H   pantoprazole  40 mg Oral QHS   polyethylene glycol  17 g Oral Daily   senna-docusate  1 tablet Oral QHS   Continuous Infusions: PRN Meds:.acetaminophen, alum & mag hydroxide-simeth, guaiFENesin, hydrALAZINE, HYDROmorphone (DILAUDID) injection, ipratropium-albuterol, lip balm, loratadine, LORazepam, meclizine, melatonin, metoprolol tartrate, ondansetron (ZOFRAN) IV, oxyCODONE, prochlorperazine, traZODone  Current Outpatient Medications  Medication Instructions   acetaminophen (TYLENOL) 650 mg, Oral, Every 6 hours PRN   BIOTIN PO 1 tablet, Oral, Daily   Calcium Citrate-Vitamin D (CALCIUM CITRATE + D3 PO) 1 tablet, Oral, 2 times daily   carboxymethylcellulose (REFRESH PLUS) 0.5 % SOLN 1 drop, Both Eyes, Daily PRN   cetirizine (ZYRTEC) 10 mg, Oral, Daily PRN   cyanocobalamin 1,000 mcg, Oral, Daily, Vitamin b 12   furosemide (LASIX) 20 mg, Oral, Daily   ibuprofen (ADVIL) 800 mg, Oral, Every 6 hours PRN   losartan (COZAAR) 100 mg, Oral, Daily   meclizine (ANTIVERT) 25 MG tablet TAKE 1 TABLET BY MOUTH 3  TIMES DAILY AS NEEDED FOR  DIZZINESS   Menthol, Topical Analgesic, (BIOFREEZE ROLL-ON EX) 1 Application, Apply externally, 2 times daily PRN   pantoprazole (  PROTONIX) 40 mg, Oral, Daily   polyethylene glycol (MIRALAX / GLYCOLAX) 17 g, Oral, Daily PRN   potassium chloride SA (KLOR-CON M) 20 MEQ tablet TAKE 1 TABLET BY MOUTH  DAILY   Probiotic Product (ALIGN PO) 1 tablet, Daily   rosuvastatin (CRESTOR) 10 MG tablet 2 to 3 x week in am   UNABLE TO FIND Flucinonide usp 0.05 % cream prn for eczema    Diet Orders (From admission, onward)      Start     Ordered   02/21/22 0917  Diet NPO time specified Except for: Sips with Meds  Diet effective now       Question:  Except for  Answer:  Sips with Meds   02/21/22 0916            DVT prophylaxis: enoxaparin (LOVENOX) injection 40 mg Start: 02/19/22 1000   Lab Results  Component Value Date   PLT 251 02/21/2022      Code Status: Full Code  Family Communication: no family at bedside  Status is: Inpatient, port placement, pain control  Level of care: Telemetry  Consultants:  Oncology Palliative IR  Objective: Vitals:   02/20/22 0617 02/20/22 1444 02/20/22 2040 02/21/22 0533  BP: (!) 176/76 (!) 179/75 (!) 171/76 (!) 187/97  Pulse: (!) 57 (!) 51 (!) Brooke (!) 58  Resp: _0 Temp: 98 F (36.7 C) 97.8 F (36.6 C) (!) 97.4 F (36.3 C) 97.6 F (36.4 C)  TempSrc: Oral Oral Oral Oral  SpO2: 94% 100% 100% 96%  Weight:      Height:        Intake/Output Summary (Last 24 hours) at 02/21/2022 1108 Last data filed at 02/21/2022 0305 Gross per 24 hour  Intake 1307.45 ml  Output --  Net 1307.45 ml   Wt Readings from Last 3 Encounters:  02/16/22 89.9 kg  02/08/22 89.9 kg  11/20/21 92.8 kg    Examination:  Constitutional: NAD Eyes: no scleral icterus ENMT: Mucous membranes are moist.  Neck: normal, supple Respiratory: clear to auscultation bilaterally, no wheezing, no crackles. Normal respiratory effort. No accessory muscle use.  Cardiovascular: Regular rate and rhythm, no murmurs / rubs / gallops. No LE edema.  Abdomen: non distended, no tenderness. Bowel sounds positive.  Musculoskeletal: no clubbing / cyanosis.  Skin: no rashes   Data Reviewed: I have independently reviewed following labs and imaging studies   CBC Recent Labs  Lab 02/16/22 1418 02/17/22 0724 02/21/22 0522  WBC 5.9 4.9 8.3  HGB 12.2 10.1* 11.1*  HCT 39.3 32.6* 35.8*  PLT 235 210 251  MCV 83.6 84.7 82.3  MCH 26.0 26.2 25.5*  MCHC 31.0 31.0 31.0  RDW 13.4 13.4 13.3     Recent Labs  Lab 02/16/22 1418 02/17/22 0724 02/18/22 0507 02/21/22 0522  NA 142 137  --  133*  K 3.9 3.8  --  4.6  CL 104 105  --  100  CO2 23 28  --  25  GLUCOSE 96 114*  --  118*  BUN 6* 9  --  17  CREATININE 0.71 0.61  --  0.72  CALCIUM 11.0* 9.8  --  10.2  AST 14*  --   --   --   ALT <5  --   --   --   ALKPHOS 113  --   --   --   BILITOT 0.5  --   --   --   ALBUMIN 4.5  --   --   --  MG  --  2.0  --  2.4  INR  --   --  1.0  --     ------------------------------------------------------------------------------------------------------------------ No results for input(s): "CHOL", "HDL", "LDLCALC", "TRIG", "CHOLHDL", "LDLDIRECT" in the last 72 hours.  Lab Results  Component Value Date   HGBA1C 6.2 04/02/2021   ------------------------------------------------------------------------------------------------------------------ No results for input(s): "TSH", "T4TOTAL", "T3FREE", "THYROIDAB" in the last 72 hours.  Invalid input(s): "FREET3"  Cardiac Enzymes No results for input(s): "CKMB", "TROPONINI", "MYOGLOBIN" in the last 168 hours.  Invalid input(s): "CK" ------------------------------------------------------------------------------------------------------------------ No results found for: "BNP"  CBG: No results for input(s): "GLUCAP" in the last 168 hours.  No results found for this or any previous visit (from the past 240 hour(s)).   Radiology Studies: No results found.   Marzetta Board, MD, PhD [facility]  Between 7 am - 7 pm I am available, please contact me via Amion (for emergencies) or Securechat (non urgent messages)  Between 7 pm - 7 am I am not available, please contact night coverage MD/APP via Amion

## 2022-02-21 NOTE — Progress Notes (Signed)
[name]   DOB:08/02/1945   HG#:[id number]    ASSESSMENT & PLAN:   Remote history of treated breast cancer, recently treated bladder cancer now with stage IV metastatic disease with new lung mass and bone lesions Her presentation is highly suspicious for primary metastatic lung cancer but recurrent metastatic breast cancer cannot be excluded I will obtain prior records to determine the treatment that she has received for her breast cancer MRI of the brain showed no evidence of intracranial metastasis MRI of the spine showed diffuse metastatic disease but no evidence of pathological fracture Biopsy of rib lesion was performed on October 30, results pending Port placement is pending   Cancer associated pain Her pain is better controlled  Palliative radiation consult obtained, to start ASAP   nausea and vomiting, resolved She has antinausea medicine to take as needed   Mild anemia chronic disease Observe closely   History of chronic constipation We will get her scheduled laxatives due to anticipated constipation from pain management   Goals of care discussion She needs to be admitted to expedite work-up and for pain management   Discharge planning From my perspective, she can be discharged after port placement I will follow on results of her pathology and will arrange follow-up appointment accordingly  All questions were answered. The patient knows to call the clinic with any problems, questions or concerns.   The total time spent in the appointment was 30 minutes encounter with patients including review of chart and various tests results, discussions about plan of care and coordination of care plan  Heath Lark, MD 02/21/2022 8:37 AM  Subjective:  She was still asleep when I got in.  She felt that her pain is well controlled.  Denies constipation.  Overall, she is not certain she can go home because she felt weak overall  Objective:  Vitals:   02/20/22 2040 02/21/22  0533  BP: (!) 171/76 (!) 187/97  Pulse: (!) 56 (!) 58  Resp: 16 20  Temp: (!) 97.4 F (36.3 C) 97.6 F (36.4 C)  SpO2: 100% 96%     Intake/Output Summary (Last 24 hours) at 02/21/2022 0837 Last data filed at 02/21/2022 0305 Gross per 24 hour  Intake 1713.15 ml  Output --  Net 1713.15 ml    GENERAL:alert, no distress and comfortable  NEURO: alert & oriented x 3 with fluent speech, no focal motor/sensory deficits   Labs:  Recent Labs    09/11/21 1615 10/09/21 1517 02/08/22 1317 02/16/22 1418 02/17/22 0724 02/21/22 0522  NA 143   < > 142 142 137 133*  K 4.1   < > 3.8 3.9 3.8 4.6  CL 107   < > 106 104 105 100  CO2 26   < > 25 23 28 25   GLUCOSE 84   < > 97 96 114* 118*  BUN 11   < > 14 6* 9 17  CREATININE 0.79   < > 0.91 0.71 0.61 0.72  CALCIUM 10.6*   < > 10.9* 11.0* 9.8 10.2  GFRNONAA  --   --   --  >60 >60 >60  PROT 7.0  --  7.2 7.3  --   --   ALBUMIN 4.5  --  4.5 4.5  --   --   AST 13  --  15 14*  --   --   ALT 7  --  6 <5  --   --   ALKPHOS 93  --  98 113  --   --  BILITOT 0.3  --  0.4 0.5  --   --    < > = values in this interval not displayed.    Studies:  CT BONE TROCAR/NEEDLE BIOPSY SUPERFICIAL  Result Date: 02/18/2022 INDICATION: 76 year old female referred for biopsy of right chest wall mass EXAM: CT-GUIDED BIOPSY RIGHT CHEST WALL MASS MEDICATIONS: None. ANESTHESIA/SEDATION: Moderate (conscious) sedation was not employed during this procedure. Moderate Sedation Time: 0 minutes. The patient's level of consciousness and vital signs were monitored continuously by radiology nursing throughout the procedure under my direct supervision. FLUOROSCOPY TIME:  CT COMPLICATIONS: None PROCEDURE: Informed written consent was obtained from the patient after a thorough discussion of the procedural risks, benefits and alternatives. All questions were addressed. Maximal Sterile Barrier Technique was utilized including caps, mask, sterile gowns, sterile gloves, sterile drape,  hand hygiene and skin antiseptic. A timeout was performed prior to the initiation of the procedure. Patient positioned supine on the CT gantry table. Scout CT was acquired for planning purposes. The right chest wall was prepped with chlorhexidine in a sterile fashion, and a sterile drape was applied covering the operative field. A sterile gown and sterile gloves were used for the procedure. Local anesthesia was provided with 1% Lidocaine. CT guidance was used for placement of a guide needle into the right chest wall mass, with soft tissue replacing right seventh rib. Needle was tangential to the rib, directed superficially. Multiple 18 gauge core biopsy then acquired in placed into formalin. Needle was removed and CT was performed at 5 minutes and 10 minutes to observe a small hematoma which did not enlarge over time. Patient tolerated the procedure well and remained hemodynamically stable throughout. No complications were encountered and no significant blood loss was encounter IMPRESSION: Status post CT-guided biopsy of soft tissue mass replacing the right anterior seventh rib Signed, Dulcy Fanny. Nadene Rubins, RPVI [facility] Electronically Signed   By: Corrie Mckusick D.O.   On: 02/18/2022 13:03   MR TOTAL SPINE METS SCREENING  Result Date: 02/18/2022 CLINICAL DATA:  Lung mass, evaluate for metastatic disease. EXAM: MRI TOTAL SPINE WITHOUT AND WITH CONTRAST TECHNIQUE: Multisequence MR imaging of the spine from the cervical spine to the sacrum was performed prior to and following IV contrast administration for evaluation of spinal metastatic disease. CONTRAST:  54mL GADAVIST GADOBUTROL 1 MMOL/ML IV SOLN COMPARISON:  No prior MRI of the spine available, correlation is made with CT chest abdomen pelvis 02/16/2022 FINDINGS: Vertebrae: Multifocal areas of decreased T1 signal, increased T2 signal, and contrast enhancement are seen throughout the spine, ribs,  and sacrum. The most prominently involved vertebral body is T7, where abnormal signal makes up the majority of the vertebral body (series 25, image 9) with an additional focus in the spinous process (series 25, image 8). No evidence of pathologic fracture. Additional smaller foci of abnormal signal are seen in C2 (series 25, images 8 and 10), C7 body and inferior articular facet (series 25, images 8, 10, and 14), T1 (series 25, image 9), T2 inferior articular facet and rib (series 25, images 1 and 10), third rib (series 25, image 14), T4 body and spinous process (series 25, images 9 and 10), T5 pedicle (series 25, image 6), T8 (series 25, images 6 and 11), T9 body and rib (series 25, images 1 and 6), T10 (series 26, images 6, 9, and 12), T11 (series 26, images 7 and 8), T12 (series 26, images 6-8, 11- 13), L1 (series 26, image  12), L2 (series 26, images 6-11), L3 (series 26, images 7, 8, 10, and 12), L4 inferior articular facet, pedicle, and transverse process (series 26, images 4, 14, and 26), L5 (series 26, image 8), and the sacrum and iliac bones (series 26, images 1-3, 11-13, 16, and 19). No definite posterior cortical breakthrough and epidural extension of tumor. The posterior cortex T7, T11, and T12 (series 25, image 8 and series 26, image 8), appears intact. No high-grade spinal canal stenosis. Segmentation: 5 lumbar-type vertebral bodies. Alignment: Physiologic. Cord:  Normal morphology and signal.  No abnormal enhancement. IMPRESSION: 1. Multifocal osseous metastatic disease throughout the spine, ribs, and sacrum and iliac bones, as described above. No evidence of pathologic fracture. 2. No definite epidural extension of tumor. The axial images that were purportedly obtained with this scan were not transmitted at the time of interpretation, and multiple attempts to request the sequences went unanswered. If there is persistent concern, an addendum could be made when these images are transmitted.  Electronically Signed   By: Merilyn Baba M.D.   On: 02/18/2022 02:23   MR BRAIN W WO CONTRAST  Result Date: 02/18/2022 CLINICAL DATA:  Lung cancer, staging EXAM: MRI HEAD WITHOUT AND WITH CONTRAST TECHNIQUE: Multiplanar, multiecho pulse sequences of the brain and surrounding structures were obtained without and with intravenous contrast. CONTRAST:  75mL GADAVIST GADOBUTROL 1 MMOL/ML IV SOLN COMPARISON:  07/14/2018 MRI head FINDINGS: Brain: No restricted diffusion to suggest acute or subacute infarct. No abnormal parenchymal or meningeal enhancement. No acute hemorrhage, mass, mass effect, or midline shift. No hydrocephalus or extra-axial collection. No hemosiderin deposition to suggest remote hemorrhage. Scattered T2 hyperintense signal in the periventricular white matter, likely the sequela of mild chronic small vessel ischemic disease. Vascular: Normal arterial flow voids. Normal arterial and venous enhancement. Skull and upper cervical spine: Enhancing osseous lesions in the left parietal bone (series 16, images 79 and 136), right parietal bone (series 16, image 136), right frontal bone (series 16, image 118), and the head of the left mandible (series 16, image 20). Sinuses/Orbits: No acute finding. Status post bilateral lens replacements. Other: Trace fluid in the bilateral mastoid air cells. IMPRESSION: 1. Enhancing osseous lesions in the left parietal bone, right parietal bone, right frontal bone, and head of the left mandible, concerning for osseous metastatic disease. 2. No evidence of intracranial metastatic disease. Electronically Signed   By: Merilyn Baba M.D.   On: 02/18/2022 01:28   CT Chest Wo Contrast  Result Date: 02/16/2022 CLINICAL DATA:  Lung nodule, > 34mm.  Dyspnea EXAM: CT CHEST WITHOUT CONTRAST TECHNIQUE: Multidetector CT imaging of the chest was performed following the standard protocol without IV contrast. RADIATION DOSE REDUCTION: This exam was performed according to the  departmental dose-optimization program which includes automated exposure control, adjustment of the mA and/or kV according to patient size and/or use of iterative reconstruction technique. COMPARISON:  None Available. FINDINGS: Cardiovascular: Moderate multi-vessel coronary artery calcification. Global cardiac size is within normal limits. No pericardial effusion. Central pulmonary arteries are of normal caliber. Mild atherosclerotic calcification within the thoracic aorta. No aortic aneurysm. Mediastinum/Nodes: No enlarged mediastinal or axillary lymph nodes. Thyroid gland, trachea, and esophagus demonstrate no significant findings. Lungs/Pleura: The opacity seen on recent chest radiograph corresponds to a lobulated soft tissue mass within the a anterior left upper lobe measuring 2.9 x 4.1 cm at axial image # 43/2, suspicious for a primary bronchogenic neoplasm. The mass abuts the pleural surface but does not clearly transgress into the  anterior mediastinum. There are innumerable micro nodules seen distributed randomly throughout the lungs bilaterally which appear new since the visualized lung bases on examination of 11/18/2017 and are suspicious for intra pulmonary miliary metastatic disease. Small bilateral pleural effusions are present. No pneumothorax. No central obstructing lesion. Upper Abdomen: No acute abnormality. Musculoskeletal: There are erosive changes involving the right eighth rib anteriorly and left seventh ribs anterolaterally in keeping with osseous metastatic disease. There are, additionally, subtle lytic lesions seen throughout the thoracic spine, best appreciated within the T10 and T11 vertebral bodies also suspicious osseous metastatic disease. There is a acute appearing fracture of the right seventh rib laterally with associated subpleural thickening likely representing edema. No associated soft tissue mass or erosion is identified and this may simply represent a posttraumatic fracture. Left  mastectomy and axillary lymph node dissection has been performed. IMPRESSION: 1. 4.1 cm left upper lobe pulmonary mass suspicious for a primary bronchogenic neoplasm. Innumerable micro nodules distributed randomly throughout the lungs bilaterally suspicious for intra pulmonary miliary metastatic disease. PET CT examination may be helpful to direct biopsy strategy, if indicated. 2. Osseous metastatic disease with erosive changes involving the right eighth rib anteriorly and left seventh ribs anterolaterally. Additional subtle lytic lesions seen throughout the thoracic spine, best appreciated within the T10 and T11 vertebral bodies also suspicious for osseous metastatic disease. This would be better assessed with contrast enhanced MRI examination if indicated. 3. Acute appearing fracture of the right seventh rib laterally with associated subpleural thickening likely representing edema. No associated soft tissue mass or erosion is identified and this may simply represent a posttraumatic fracture. 4. Moderate multi-vessel coronary artery calcification. 5. Small bilateral pleural effusions. 6. Left mastectomy and axillary lymph node dissection. Aortic Atherosclerosis (ICD10-I70.0). Electronically Signed   By: Fidela Salisbury M.D.   On: 02/16/2022 20:22   DG Chest 1 View  Result Date: 02/16/2022 CLINICAL DATA:  Upper abdominal pain.  Shortness of breath. EXAM: CHEST  1 VIEW COMPARISON:  09/23/2011 FINDINGS: Asymmetric elevation left hemidiaphragm. Right lung is clear. Focal opacity identified in the medial left upper lobe. The cardiopericardial silhouette is within normal limits for size. The visualized bony structures of the thorax are unremarkable. Telemetry leads overlie the chest. IMPRESSION: Focal masslike opacity in the medial left upper lobe. This could potentially be pneumonia, but imaging features are suspicious for soft tissue mass lesion. CT chest recommended to further evaluate. Electronically Signed   By:  Misty Stanley M.D.   On: 02/16/2022 18:44   CT ABDOMEN PELVIS W CONTRAST  Result Date: 02/16/2022 CLINICAL DATA:  Acute abdominal pain. EXAM: CT ABDOMEN AND PELVIS WITH CONTRAST TECHNIQUE: Multidetector CT imaging of the abdomen and pelvis was performed using the standard protocol following bolus administration of intravenous contrast. RADIATION DOSE REDUCTION: This exam was performed according to the departmental dose-optimization program which includes automated exposure control, adjustment of the mA and/or kV according to patient size and/or use of iterative reconstruction technique. CONTRAST:  74mL OMNIPAQUE IOHEXOL 300 MG/ML  SOLN COMPARISON:  CT abdomen and pelvis 11/24/2020 FINDINGS: Lower chest: There are new innumerable bilateral pulmonary micro nodules throughout both lungs. There are new small bilateral pleural effusions. Hepatobiliary: Gallstones are present. There is no biliary ductal dilatation. The liver is within normal limits. Pancreas: Unremarkable. No pancreatic ductal dilatation or surrounding inflammatory changes. Spleen: Normal in size without focal abnormality. Adrenals/Urinary Tract: Adrenal glands are unremarkable. Kidneys are normal, without renal calculi, focal lesion, or hydronephrosis. Bladder is unremarkable. Stomach/Bowel: There is diffuse  colonic diverticulosis without evidence for acute diverticulitis. Appendix is not visualized. No bowel obstruction, wall thickening, focal inflammation or free air. Stomach within normal limits. Vascular/Lymphatic: Aortic atherosclerosis. No enlarged abdominal or pelvic lymph nodes. Reproductive: Status post hysterectomy. No adnexal masses. Other: No ascites or free air. Surgical clips are seen in the left inguinal region. There is some scarring in the left anterior abdominal wall. Musculoskeletal: There is expansile rib lesion with soft tissue component of the anterolateral right eighth rib fracture. This lesion measures 2.6 x 4.9 x 3.7 cm.  There also small lytic lesions in the left anterolateral seventh rib. There also likely small lytic lesions throughout multiple lower thoracic vertebral bodies, particularly at T10 and T11. No acute fractures are seen. IMPRESSION: 1. New innumerable pulmonary micro nodules throughout both lungs worrisome for metastatic disease. 2. New small bilateral pleural effusions. 3. Expansile lytic lesion of the right eighth rib with soft tissue component worrisome for metastatic disease. There are additional lytic lesions in the left seventh rib and lower thoracic spine concerning for metastatic disease. 4. No acute localizing process in the abdomen or pelvis. 5. Cholelithiasis. 6. Colonic diverticulosis. Aortic Atherosclerosis (ICD10-I70.0). Electronically Signed   By: Ronney Asters M.D.   On: 02/16/2022 18:40

## 2022-02-21 NOTE — Consult Note (Signed)
Consultation Note Date: 02/21/2022   Patient Name: [name]  DOB: 03-28-1946  MRN: [id number]  Age / Sex: 76 y.o., female  PCP: [place], Betty G, MD Referring Physician: Caren Griffins, MD  Reason for Consultation:   HPI/Patient Profile: 76 y.o. female  with past medical history of breast cancer (2020) and bladder cancer (2022), HTN, HLD, DMII  admitted on 02/16/2022 with 2 month duration of abdominal pain, shoulder and back pain found to have metastatic disease with lesions in the lungs, spine and ribs. Pathology report is pending. Oncology is following. Palliative Care team consulted for symptom management and supportive care. Pt completed one cycle of radiation and has plans to get Chemo-Port placed. Oncology is following and plan is for outpatient follow up that will be arranged by the oncology team.   Primary Decision Maker PATIENT  Discussion: Met with pt at bedside. Patient without any current pain or acute concerns but stated she has been seeing a lot of doctors which is overwhelming. Discussed symptom management and GOC with patient. Pt states she was in pain for the last 2 months and was taking tylenol and ibuprofen with some relief but the pain was worsening. Discussed with patient our goal to manage symptoms while her oncologic work up is ongoing. Advised pt that our team will continue to follow with during her oncology visits. Pt was agreeable. She states she would like to have all of her 4 children involved in the decision making process if she is unable to and is not able to pick one at the moment. She would also want them to know about her diagnosis and would defer tell her grand and great-grandchildren to her offspring.   Palliative medicine is specialized medical care for people living with serious illness. It focuses on providing relief from the symptoms and stress of a serious illness.  The goal is to improve quality of life for both the patient and the family. Goals of care: Broad aims of medical therapy in relation to the patient's values and preferences. Our aim is to provide medical care aimed at enabling patients to achieve the goals that matter most to them, given the circumstances of their particular medical situation and their constraints.    SUMMARY OF RECOMMENDATIONS   -Continue current measures with the following changes in pain regimen:   -Start MS Contin 30 mg BID, Continue Oxycodone 15 mg q3hr prn, and 2 mg IV dilaudid q4hrs prn for breakthrough pain.     -Palliative care team will continue to follow outpatient, consult placed to palliative care at [facility].   Code Status/Advance Care Planning: Full code  Prognosis:   Unable to determine  Discharge Planning: Home with Home Health Outpatient palliative   Primary Diagnoses: Present on Admission:  Metastatic disease ([place])  ROS: Negative unless stated in the discussions.   Vital Signs: BP (!) 187/97 (BP Location: Right Arm)   Pulse (!) 58   Temp 97.6 F (36.4 C) (Oral)   Resp 20  Ht 5' 7.5" (1.715 m)   Wt 89.9 kg   SpO2 96%   BMI 30.58 kg/m  Pain Scale: 0-10 POSS *See Group Information*: S-Acceptable,Sleep, easy to arouse Pain Score: 6    SpO2: SpO2: 96 % O2 Device:SpO2: 96 % O2 Flow Rate: .O2 Flow Rate (L/min): 2 L/min  IO: Intake/output summary:  Intake/Output Summary (Last 24 hours) at 02/21/2022 1105 Last data filed at 02/21/2022 0305 Gross per 24 hour  Intake 1307.45 ml  Output --  Net 1307.45 ml    LBM: Last BM Date : 02/17/22 Baseline Weight: Weight: 89.9 kg Most recent weight: Weight: 89.9 kg       Thank you for this consult. Palliative medicine will continue to follow and assist as needed.  Time Total: 60 Greater than 50%  of this time was spent counseling and coordinating care related to the above assessment and plan.  Signed by: Zeba Anwar MD [facility] palliative medicine team  Please contact Palliative Medicine Team phone at [phone] for questions and concerns.  For individual provider: See Amion 

## 2022-02-21 NOTE — Procedures (Signed)
Interventional Radiology Procedure Note ° °Procedure: Single Lumen Power Port Placement   ° °Access:  Right internal jugular vein ° °Findings: Catheter tip positioned at cavoatrial junction. Port is ready for immediate use.  ° °Complications: None ° °EBL: < 10 mL ° °Recommendations:  °- Ok to shower in 24 hours °- Do not submerge for 7 days °- Routine line care  ° ° °Gwenevere Goga, MD ° ° ° °

## 2022-02-22 ENCOUNTER — Other Ambulatory Visit: Payer: Self-pay

## 2022-02-22 ENCOUNTER — Telehealth: Payer: Self-pay | Admitting: Family Medicine

## 2022-02-22 ENCOUNTER — Ambulatory Visit
Admit: 2022-02-22 | Discharge: 2022-02-22 | Disposition: A | Discharge status: Home / Self Care | Payer: Medicare Other | Attending: Radiation Oncology | Admitting: Radiation Oncology

## 2022-02-22 DIAGNOSIS — Z515 Encounter for palliative care: Secondary | ICD-10-CM

## 2022-02-22 DIAGNOSIS — Z7189 Other specified counseling: Secondary | ICD-10-CM

## 2022-02-22 LAB — RAD ONC ARIA SESSION SUMMARY

## 2022-02-22 MED ORDER — HEPARIN SOD (PORK) LOCK FLUSH 100 UNIT/ML IV SOLN
500.0000 [IU] | INTRAVENOUS | Status: AC | PRN
  Administered 2022-02-22: 500 [IU]
  Filled 2022-02-22: qty 5

## 2022-02-22 MED ORDER — MORPHINE SULFATE ER 30 MG PO TBCR: 30.0000 mg | EXTENDED_RELEASE_TABLET | Freq: Two times a day (BID) | ORAL | 0 refills | Status: DC

## 2022-02-22 MED ORDER — CHLORHEXIDINE GLUCONATE CLOTH 2 % EX PADS
6.0000 | MEDICATED_PAD | Freq: Every day | CUTANEOUS | Status: DC
  Administered 2022-02-22: 6 via TOPICAL

## 2022-02-22 MED ORDER — DEXAMETHASONE 4 MG PO TABS: 4.0000 mg | ORAL_TABLET | Freq: Three times a day (TID) | ORAL | 0 refills | Status: DC

## 2022-02-22 MED ORDER — OXYCODONE HCL 15 MG PO TABS: 15.0000 mg | ORAL_TABLET | Freq: Four times a day (QID) | ORAL | 0 refills | Status: DC | PRN

## 2022-02-22 NOTE — Discharge Summary (Signed)
Physician Discharge Summary  [name] JOI:[id number] DOB: 06/03/1945 DOA: 02/16/2022  PCP: Martinique, Betty G, MD  Admit date: 02/16/2022 Discharge date: 02/22/2022  Admitted From: home Disposition:  home  Recommendations for Outpatient Follow-up:  Follow up with Oncology as scheduled  Home Health: none Equipment/Devices: none  Discharge Condition: stable CODE STATUS: Full code Diet Orders (From admission, onward)     Start     Ordered   02/21/22 1635  Diet regular Room service appropriate? Yes; Fluid consistency: Thin  Diet effective now       Question Answer Comment  Room service appropriate? Yes   Fluid consistency: Thin      02/21/22 1634           HPI: Per admitting MD, [name] is a 76 y.o. female with medical history significant for coronary artery disease, essential hypertension, hyperlipidemia, history of breast cancer, currently being treated for bladder cancer who initially presented to [facility] ED with complaints of abdominal pain, back pain and ribs pain.  Work-up in the ED revealed CT evidence of primary lung mass, pulmonary metastatic disease, bone mets and ribs fracture.  EDP discussed the case with Dr. Alvy Bimler, hematology oncology, who recommended admission for pain control and Decadron.  Plan for biopsy and further work-up while inpatient.  The patient was admitted by [facility], Dr. Marlowe Sax, and transferred to [facility] long hospital telemetry unit as observation status. At the time of this visit, the patient's pain is well controlled with IV Dilaudid.  No nausea.  No anginal symptoms.  Hospital Course / Discharge diagnoses: Principal problem Metastatic cancer -patient was admitted to the hospital and found to have metastatic cancer.  Oncology and radiation oncology consulted and followed patient while hospitalized.  She does not history of breast cancer as well as recently treated bladder cancer.  She also has a new lung nodule.  IR consulted and  she is status post marrow biopsy, and results are still primary but points to recurrence of her breast cancer.  She started radiation therapy, IR was consulted and had a port placement for potential future chemotherapy.   Active problems  Cancer related pain-palliative consulted, she was started on MS Contin as well as oxycodone, with improvement in her pain regimen.  She will be given a week worth of pain medication upon discharge, should follow-up in the cancer center as well as outpatient palliative for further titration of her pain regimen  History of breast cancer,  History of bladder cancer -Will continue to follow with oncology Hyperlipidemia -On Crestor Essential hypertension -resume home medications Obesity, class I-BMI 30.5 Hyponatremia-sodium 133, mild, asymptomatic Mild normocytic anemia-in the context of malignancy.  No bleeding.  Hemoglobin stable  Sepsis ruled out   Discharge Instructions   Allergies as of 02/22/2022       Reactions   Hydrochlorothiazide Other (See Comments)   High Ca++   Flomax [tamsulosin] Itching   Codeine Itching   Statins Other (See Comments)   Muscle aches   Zetia [ezetimibe] Other (See Comments)   Muscle aches        Medication List     TAKE these medications    acetaminophen 325 MG tablet Commonly known as: TYLENOL Take 650 mg by mouth every 6 (six) hours as needed for mild pain.   ALIGN PO Take 1 tablet by mouth daily.   BIOFREEZE ROLL-ON EX Apply 1 Application topically 2 (two) times daily as needed (pain).   BIOTIN PO Take 1 tablet by  mouth daily.   CALCIUM CITRATE + D3 PO Take 1 tablet by mouth 2 (two) times daily.   carboxymethylcellulose 0.5 % Soln Commonly known as: REFRESH PLUS Place 1 drop into both eyes daily as needed (dry eyes).   cetirizine 10 MG tablet Commonly known as: ZYRTEC Take 10 mg by mouth daily as needed for allergies.   cyanocobalamin 1000 MCG tablet Take 1,000 mcg by mouth daily. Vitamin b  12   dexamethasone 4 MG tablet Commonly known as: DECADRON Take 1 tablet (4 mg total) by mouth every 8 (eight) hours for 7 days.   furosemide 20 MG tablet Commonly known as: LASIX Take 1 tablet (20 mg total) by mouth daily.   ibuprofen 200 MG tablet Commonly known as: ADVIL Take 800 mg by mouth every 6 (six) hours as needed for mild pain.   losartan 100 MG tablet Commonly known as: COZAAR Take 1 tablet (100 mg total) by mouth daily.   meclizine 25 MG tablet Commonly known as: ANTIVERT TAKE 1 TABLET BY MOUTH 3  TIMES DAILY AS NEEDED FOR  DIZZINESS What changed:  how much to take how to take this when to take this reasons to take this additional instructions   morphine 30 MG 12 hr tablet Commonly known as: MS CONTIN Take 1 tablet (30 mg total) by mouth every 12 (twelve) hours for 7 days.   oxyCODONE 15 MG immediate release tablet Commonly known as: ROXICODONE Take 1 tablet (15 mg total) by mouth every 6 (six) hours as needed for up to 7 days for moderate pain or breakthrough pain.   pantoprazole 40 MG tablet Commonly known as: PROTONIX Take 1 tablet (40 mg total) by mouth daily. What changed: when to take this   polyethylene glycol 17 g packet Commonly known as: MIRALAX / GLYCOLAX Take 17 g by mouth daily as needed for mild constipation.   potassium chloride SA 20 MEQ tablet Commonly known as: KLOR-CON M TAKE 1 TABLET BY MOUTH  DAILY What changed:  how much to take when to take this   rosuvastatin 10 MG tablet Commonly known as: CRESTOR 2 to 3 x week in am What changed:  how much to take how to take this when to take this   UNABLE TO FIND Flucinonide usp 0.05 % cream prn for eczema               Durable Medical Equipment  (From admission, onward)           Start     Ordered   02/22/22 1202  For home use only DME Shower stool  Once        02/22/22 1201   02/22/22 1202  For home use only DME Bedside commode  Once       Question:  Patient  needs a bedside commode to treat with the following condition  Answer:  Cancer related pain   02/22/22 1201   02/22/22 1201  For home use only DME Walker  Once       Question:  Patient needs a walker to treat with the following condition  Answer:  Cancer related pain   02/22/22 1201             Consultations: Oncology   Procedures/Studies:  IR IMAGING GUIDED PORT INSERTION  Result Date: 02/21/2022 INDICATION: 76 year old female with history of advanced stage bladder cancer and lung mass requiring central venous access for chemotherapy administration EXAM: IMPLANTED PORT A CATH PLACEMENT WITH ULTRASOUND AND FLUOROSCOPIC  GUIDANCE COMPARISON:  None Available. MEDICATIONS: None. ANESTHESIA/SEDATION: Moderate (conscious) sedation was employed during this procedure. A total of Versed 1 mg and Fentanyl 50 mcg was administered intravenously. Moderate Sedation Time: 17 minutes. The patient's level of consciousness and vital signs were monitored continuously by radiology nursing throughout the procedure under my direct supervision. CONTRAST:  None FLUOROSCOPY TIME:  0.1 minutes, 0 mGy COMPLICATIONS: None immediate. PROCEDURE: The procedure, risks, benefits, and alternatives were explained to the patient. Questions regarding the procedure were encouraged and answered. The patient understands and consents to the procedure. The right neck and chest were prepped with chlorhexidine in a sterile fashion, and a sterile drape was applied covering the operative field. Maximum barrier sterile technique with sterile gowns and gloves were used for the procedure. A timeout was performed prior to the initiation of the procedure. Ultrasound was used to examine the jugular vein which was compressible and free of internal echoes. A skin marker was used to demarcate the planned venotomy and port pocket incision sites. Local anesthesia was provided to these sites and the subcutaneous tunnel track with 1% lidocaine with  1:100,000 epinephrine. A small incision was created at the jugular access site and blunt dissection was performed of the subcutaneous tissues. Under ultrasound guidance, the jugular vein was accessed with a 21 ga micropuncture needle and an 0.018" wire was inserted to the superior vena cava. Real-time ultrasound guidance was utilized for vascular access including the acquisition of a permanent ultrasound image documenting patency of the accessed vessel. A 5 Fr micopuncture set was then used, through which a 0.035" Rosen wire was passed under fluoroscopic guidance into the inferior vena cava. An 8 Fr dilator was then placed over the wire. A subcutaneous port pocket was then created along the upper chest wall utilizing a combination of sharp and blunt dissection. The pocket was irrigated with sterile saline, packed with gauze, and observed for hemorrhage. A single lumen standard size power injectable port was chosen for placement. The 8 Fr catheter was tunneled from the port pocket site to the venotomy incision. The port was placed in the pocket. The external catheter was trimmed to appropriate length. The dilator was exchanged for an 8 Fr peel-away sheath under fluoroscopic guidance. The catheter was then placed through the sheath and the sheath was removed. Final catheter positioning was confirmed and documented with a fluoroscopic spot radiograph. The port was accessed with a Huber needle, aspirated, and flushed with heparinized saline. The deep dermal layer of the port pocket incision was closed with interrupted 3-0 Vicryl suture. The skin was opposed with a running subcuticular 4-0 Monocryl suture. Dermabond was then placed over the port pocket and neck incisions. The patient tolerated the procedure well without immediate post procedural complication. FINDINGS: After catheter placement, the tip lies within the superior cavoatrial junction. The catheter aspirates and flushes normally and is ready for immediate  use. IMPRESSION: Successful placement of a power injectable Port-A-Cath via the right internal jugular vein. The catheter is ready for immediate use. Ruthann Cancer, MD [facility] Radiology Electronically Signed   By: Ruthann Cancer M.D.   On: 02/21/2022 16:47   CT BONE TROCAR/NEEDLE BIOPSY SUPERFICIAL  Result Date: 02/18/2022 INDICATION: 76 year old female referred for biopsy of right chest wall mass EXAM: CT-GUIDED BIOPSY RIGHT CHEST WALL MASS MEDICATIONS: None. ANESTHESIA/SEDATION: Moderate (conscious) sedation was not employed during this procedure. Moderate Sedation Time: 0 minutes. The patient's level of consciousness and vital signs were monitored continuously by radiology  nursing throughout the procedure under my direct supervision. FLUOROSCOPY TIME:  CT COMPLICATIONS: None PROCEDURE: Informed written consent was obtained from the patient after a thorough discussion of the procedural risks, benefits and alternatives. All questions were addressed. Maximal Sterile Barrier Technique was utilized including caps, mask, sterile gowns, sterile gloves, sterile drape, hand hygiene and skin antiseptic. A timeout was performed prior to the initiation of the procedure. Patient positioned supine on the CT gantry table. Scout CT was acquired for planning purposes. The right chest wall was prepped with chlorhexidine in a sterile fashion, and a sterile drape was applied covering the operative field. A sterile gown and sterile gloves were used for the procedure. Local anesthesia was provided with 1% Lidocaine. CT guidance was used for placement of a guide needle into the right chest wall mass, with soft tissue replacing right seventh rib. Needle was tangential to the rib, directed superficially. Multiple 18 gauge core biopsy then acquired in placed into formalin. Needle was removed and CT was performed at 5 minutes and 10 minutes to observe a small hematoma which did not  enlarge over time. Patient tolerated the procedure well and remained hemodynamically stable throughout. No complications were encountered and no significant blood loss was encounter IMPRESSION: Status post CT-guided biopsy of soft tissue mass replacing the right anterior seventh rib Signed, Dulcy Fanny. Nadene Rubins, RPVI [facility] Radiology Electronically Signed   By: Corrie Mckusick D.O.   On: 02/18/2022 13:03   MR TOTAL SPINE METS SCREENING  Result Date: 02/18/2022 CLINICAL DATA:  Lung mass, evaluate for metastatic disease. EXAM: MRI TOTAL SPINE WITHOUT AND WITH CONTRAST TECHNIQUE: Multisequence MR imaging of the spine from the cervical spine to the sacrum was performed prior to and following IV contrast administration for evaluation of spinal metastatic disease. CONTRAST:  56mL GADAVIST GADOBUTROL 1 MMOL/ML IV SOLN COMPARISON:  No prior MRI of the spine available, correlation is made with CT chest abdomen pelvis 02/16/2022 FINDINGS: Vertebrae: Multifocal areas of decreased T1 signal, increased T2 signal, and contrast enhancement are seen throughout the spine, ribs, and sacrum. The most prominently involved vertebral body is T7, where abnormal signal makes up the majority of the vertebral body (series 25, image 9) with an additional focus in the spinous process (series 25, image 8). No evidence of pathologic fracture. Additional smaller foci of abnormal signal are seen in C2 (series 25, images 8 and 10), C7 body and inferior articular facet (series 25, images 8, 10, and 14), T1 (series 25, image 9), T2 inferior articular facet and rib (series 25, images 1 and 10), third rib (series 25, image 14), T4 body and spinous process (series 25, images 9 and 10), T5 pedicle (series 25, image 6), T8 (series 25, images 6 and 11), T9 body and rib (series 25, images 1 and 6), T10 (series 26, images 6, 9, and 12), T11 (series 26, images 7 and 8), T12 (series 26, images 6-8,  11- 13), L1 (series 26, image 12), L2 (series 26, images 6-11), L3 (series 26, images 7, 8, 10, and 12), L4 inferior articular facet, pedicle, and transverse process (series 26, images 4, 14, and 26), L5 (series 26, image 8), and the sacrum and iliac bones (series 26, images 1-3, 11-13, 16, and 19). No definite posterior cortical breakthrough and epidural extension of tumor. The posterior cortex T7, T11, and T12 (series 25, image 8 and series 26, image 8), appears intact. No high-grade spinal canal stenosis. Segmentation: 5 lumbar-type vertebral  bodies. Alignment: Physiologic. Cord:  Normal morphology and signal.  No abnormal enhancement. IMPRESSION: 1. Multifocal osseous metastatic disease throughout the spine, ribs, and sacrum and iliac bones, as described above. No evidence of pathologic fracture. 2. No definite epidural extension of tumor. The axial images that were purportedly obtained with this scan were not transmitted at the time of interpretation, and multiple attempts to request the sequences went unanswered. If there is persistent concern, an addendum could be made when these images are transmitted. Electronically Signed   By: Merilyn Baba M.D.   On: 02/18/2022 02:23   MR BRAIN W WO CONTRAST  Result Date: 02/18/2022 CLINICAL DATA:  Lung cancer, staging EXAM: MRI HEAD WITHOUT AND WITH CONTRAST TECHNIQUE: Multiplanar, multiecho pulse sequences of the brain and surrounding structures were obtained without and with intravenous contrast. CONTRAST:  53mL GADAVIST GADOBUTROL 1 MMOL/ML IV SOLN COMPARISON:  07/14/2018 MRI head FINDINGS: Brain: No restricted diffusion to suggest acute or subacute infarct. No abnormal parenchymal or meningeal enhancement. No acute hemorrhage, mass, mass effect, or midline shift. No hydrocephalus or extra-axial collection. No hemosiderin deposition to suggest remote hemorrhage. Scattered T2 hyperintense signal in the periventricular white matter, likely the sequela of mild  chronic small vessel ischemic disease. Vascular: Normal arterial flow voids. Normal arterial and venous enhancement. Skull and upper cervical spine: Enhancing osseous lesions in the left parietal bone (series 16, images 79 and 136), right parietal bone (series 16, image 136), right frontal bone (series 16, image 118), and the head of the left mandible (series 16, image 20). Sinuses/Orbits: No acute finding. Status post bilateral lens replacements. Other: Trace fluid in the bilateral mastoid air cells. IMPRESSION: 1. Enhancing osseous lesions in the left parietal bone, right parietal bone, right frontal bone, and head of the left mandible, concerning for osseous metastatic disease. 2. No evidence of intracranial metastatic disease. Electronically Signed   By: Merilyn Baba M.D.   On: 02/18/2022 01:28   CT Chest Wo Contrast  Result Date: 02/16/2022 CLINICAL DATA:  Lung nodule, > 25mm.  Dyspnea EXAM: CT CHEST WITHOUT CONTRAST TECHNIQUE: Multidetector CT imaging of the chest was performed following the standard protocol without IV contrast. RADIATION DOSE REDUCTION: This exam was performed according to the departmental dose-optimization program which includes automated exposure control, adjustment of the mA and/or kV according to patient size and/or use of iterative reconstruction technique. COMPARISON:  None Available. FINDINGS: Cardiovascular: Moderate multi-vessel coronary artery calcification. Global cardiac size is within normal limits. No pericardial effusion. Central pulmonary arteries are of normal caliber. Mild atherosclerotic calcification within the thoracic aorta. No aortic aneurysm. Mediastinum/Nodes: No enlarged mediastinal or axillary lymph nodes. Thyroid gland, trachea, and esophagus demonstrate no significant findings. Lungs/Pleura: The opacity seen on recent chest radiograph corresponds to a lobulated soft tissue mass within the a anterior left upper lobe measuring 2.9 x 4.1 cm at axial image #  43/2, suspicious for a primary bronchogenic neoplasm. The mass abuts the pleural surface but does not clearly transgress into the anterior mediastinum. There are innumerable micro nodules seen distributed randomly throughout the lungs bilaterally which appear new since the visualized lung bases on examination of 11/18/2017 and are suspicious for intra pulmonary miliary metastatic disease. Small bilateral pleural effusions are present. No pneumothorax. No central obstructing lesion. Upper Abdomen: No acute abnormality. Musculoskeletal: There are erosive changes involving the right eighth rib anteriorly and left seventh ribs anterolaterally in keeping with osseous metastatic disease. There are, additionally, subtle lytic lesions seen throughout the thoracic spine, best  appreciated within the T10 and T11 vertebral bodies also suspicious osseous metastatic disease. There is a acute appearing fracture of the right seventh rib laterally with associated subpleural thickening likely representing edema. No associated soft tissue mass or erosion is identified and this may simply represent a posttraumatic fracture. Left mastectomy and axillary lymph node dissection has been performed. IMPRESSION: 1. 4.1 cm left upper lobe pulmonary mass suspicious for a primary bronchogenic neoplasm. Innumerable micro nodules distributed randomly throughout the lungs bilaterally suspicious for intra pulmonary miliary metastatic disease. PET CT examination may be helpful to direct biopsy strategy, if indicated. 2. Osseous metastatic disease with erosive changes involving the right eighth rib anteriorly and left seventh ribs anterolaterally. Additional subtle lytic lesions seen throughout the thoracic spine, best appreciated within the T10 and T11 vertebral bodies also suspicious for osseous metastatic disease. This would be better assessed with contrast enhanced MRI examination if indicated. 3. Acute appearing fracture of the right seventh rib  laterally with associated subpleural thickening likely representing edema. No associated soft tissue mass or erosion is identified and this may simply represent a posttraumatic fracture. 4. Moderate multi-vessel coronary artery calcification. 5. Small bilateral pleural effusions. 6. Left mastectomy and axillary lymph node dissection. Aortic Atherosclerosis (ICD10-I70.0). Electronically Signed   By: Fidela Salisbury M.D.   On: 02/16/2022 20:22   DG Chest 1 View  Result Date: 02/16/2022 CLINICAL DATA:  Upper abdominal pain.  Shortness of breath. EXAM: CHEST  1 VIEW COMPARISON:  09/23/2011 FINDINGS: Asymmetric elevation left hemidiaphragm. Right lung is clear. Focal opacity identified in the medial left upper lobe. The cardiopericardial silhouette is within normal limits for size. The visualized bony structures of the thorax are unremarkable. Telemetry leads overlie the chest. IMPRESSION: Focal masslike opacity in the medial left upper lobe. This could potentially be pneumonia, but imaging features are suspicious for soft tissue mass lesion. CT chest recommended to further evaluate. Electronically Signed   By: Misty Stanley M.D.   On: 02/16/2022 18:44   CT ABDOMEN PELVIS W CONTRAST  Result Date: 02/16/2022 CLINICAL DATA:  Acute abdominal pain. EXAM: CT ABDOMEN AND PELVIS WITH CONTRAST TECHNIQUE: Multidetector CT imaging of the abdomen and pelvis was performed using the standard protocol following bolus administration of intravenous contrast. RADIATION DOSE REDUCTION: This exam was performed according to the departmental dose-optimization program which includes automated exposure control, adjustment of the mA and/or kV according to patient size and/or use of iterative reconstruction technique. CONTRAST:  35mL OMNIPAQUE IOHEXOL 300 MG/ML  SOLN COMPARISON:  CT abdomen and pelvis 11/24/2020 FINDINGS: Lower chest: There are new innumerable bilateral pulmonary micro nodules throughout both lungs. There are new small  bilateral pleural effusions. Hepatobiliary: Gallstones are present. There is no biliary ductal dilatation. The liver is within normal limits. Pancreas: Unremarkable. No pancreatic ductal dilatation or surrounding inflammatory changes. Spleen: Normal in size without focal abnormality. Adrenals/Urinary Tract: Adrenal glands are unremarkable. Kidneys are normal, without renal calculi, focal lesion, or hydronephrosis. Bladder is unremarkable. Stomach/Bowel: There is diffuse colonic diverticulosis without evidence for acute diverticulitis. Appendix is not visualized. No bowel obstruction, wall thickening, focal inflammation or free air. Stomach within normal limits. Vascular/Lymphatic: Aortic atherosclerosis. No enlarged abdominal or pelvic lymph nodes. Reproductive: Status post hysterectomy. No adnexal masses. Other: No ascites or free air. Surgical clips are seen in the left inguinal region. There is some scarring in the left anterior abdominal wall. Musculoskeletal: There is expansile rib lesion with soft tissue component of the anterolateral right eighth rib fracture. This lesion  measures 2.6 x 4.9 x 3.7 cm. There also small lytic lesions in the left anterolateral seventh rib. There also likely small lytic lesions throughout multiple lower thoracic vertebral bodies, particularly at T10 and T11. No acute fractures are seen. IMPRESSION: 1. New innumerable pulmonary micro nodules throughout both lungs worrisome for metastatic disease. 2. New small bilateral pleural effusions. 3. Expansile lytic lesion of the right eighth rib with soft tissue component worrisome for metastatic disease. There are additional lytic lesions in the left seventh rib and lower thoracic spine concerning for metastatic disease. 4. No acute localizing process in the abdomen or pelvis. 5. Cholelithiasis. 6. Colonic diverticulosis. Aortic Atherosclerosis (ICD10-I70.0). Electronically Signed   By: Ronney Asters M.D.   On: 02/16/2022 18:40      Subjective: - no chest pain, shortness of breath, no abdominal pain, nausea or vomiting.   Discharge Exam: BP 138/71 (BP Location: Right Arm)   Pulse 62   Temp 98 F (36.7 C) (Oral)   Resp 16   Ht 5' 7.5" (1.715 m)   Wt 89.9 kg   SpO2 97%   BMI 30.58 kg/m   General: Pt is alert, awake, not in acute distress Cardiovascular: RRR, S1/S2 +, no rubs, no gallops Respiratory: CTA bilaterally, no wheezing, no rhonchi Abdominal: Soft, NT, ND, bowel sounds + Extremities: no edema, no cyanosis  The results of significant diagnostics from this hospitalization (including imaging, microbiology, ancillary and laboratory) are listed below for reference.     Microbiology: No results found for this or any previous visit (from the past 240 hour(s)).   Labs: Basic Metabolic Panel: Recent Labs  Lab 02/16/22 1418 02/17/22 0724 02/21/22 0522  NA 142 137 133*  K 3.9 3.8 4.6  CL 104 105 100  CO2 23 28 25   GLUCOSE 96 114* 118*  BUN 6* 9 17  CREATININE 0.71 0.61 0.72  CALCIUM 11.0* 9.8 10.2  MG  --  2.0 2.4  PHOS  --  3.0  --    Liver Function Tests: Recent Labs  Lab 02/16/22 1418  AST 14*  ALT <5  ALKPHOS 113  BILITOT 0.5  PROT 7.3  ALBUMIN 4.5   CBC: Recent Labs  Lab 02/16/22 1418 02/17/22 0724 02/21/22 0522  WBC 5.9 4.9 8.3  HGB 12.2 10.1* 11.1*  HCT 39.3 32.6* 35.8*  MCV 83.6 84.7 82.3  PLT 235 210 251   CBG: No results for input(s): "GLUCAP" in the last 168 hours. Hgb A1c No results for input(s): "HGBA1C" in the last 72 hours. Lipid Profile No results for input(s): "CHOL", "HDL", "LDLCALC", "TRIG", "CHOLHDL", "LDLDIRECT" in the last 72 hours. Thyroid function studies No results for input(s): "TSH", "T4TOTAL", "T3FREE", "THYROIDAB" in the last 72 hours.  Invalid input(s): "FREET3" Urinalysis    Component Value Date/Time   COLORURINE YELLOW 02/16/2022 [place] 02/16/2022 1628   LABSPEC 1.020 02/16/2022 1628   PHURINE 6.5 02/16/2022  1628   GLUCOSEU NEGATIVE 02/16/2022 1628   GLUCOSEU NEGATIVE 09/11/2021 1615   HGBUR NEGATIVE 02/16/2022 1628   BILIRUBINUR NEGATIVE 02/16/2022 1628   KETONESUR 15 (A) 02/16/2022 1628   PROTEINUR TRACE (A) 02/16/2022 1628   UROBILINOGEN 0.2 09/11/2021 1615   NITRITE NEGATIVE 02/16/2022 1628   LEUKOCYTESUR NEGATIVE 02/16/2022 1628    FURTHER DISCHARGE INSTRUCTIONS:   Get Medicines reviewed and adjusted: Please take all your medications with you for your next visit with your Primary MD   Laboratory/radiological data: Please request your Primary MD to go over all  hospital tests and procedure/radiological results at the follow up, please ask your Primary MD to get all Hospital records sent to his/her office.   In some cases, they will be blood work, cultures and biopsy results pending at the time of your discharge. Please request that your primary care M.D. goes through all the records of your hospital data and follows up on these results.   Also Note the following: If you experience worsening of your admission symptoms, develop shortness of breath, life threatening emergency, suicidal or homicidal thoughts you must seek medical attention immediately by calling 911 or calling your MD immediately  if symptoms less severe.   You must read complete instructions/literature along with all the possible adverse reactions/side effects for all the Medicines you take and that have been prescribed to you. Take any new Medicines after you have completely understood and accpet all the possible adverse reactions/side effects.    Do not drive when taking Pain medications or sleeping medications (Benzodaizepines)   Do not take more than prescribed Pain, Sleep and Anxiety Medications. It is not advisable to combine anxiety,sleep and pain medications without talking with your primary care practitioner   Special Instructions: If you have smoked or chewed Tobacco  in the last 2 yrs please stop smoking, stop  any regular Alcohol  and or any Recreational drug use.   Wear Seat belts while driving.   Please note: You were cared for by a hospitalist during your hospital stay. Once you are discharged, your primary care physician will handle any further medical issues. Please note that NO REFILLS for any discharge medications will be authorized once you are discharged, as it is imperative that you return to your primary care physician (or establish a relationship with a primary care physician if you do not have one) for your post hospital discharge needs so that they can reassess your need for medications and monitor your lab values.  Time coordinating discharge: 40 minutes  SIGNED:  Marzetta Board, MD, PhD 02/22/2022, 12:41 PM

## 2022-02-22 NOTE — Progress Notes (Signed)
Mobility Specialist - Progress Note   02/22/22 1138  Mobility  Activity Ambulated with assistance in room;Ambulated with assistance to bathroom;Transferred to/from [facility];Ambulated with assistance in hallway  Level of Assistance Standby assist, set-up cues, supervision of patient - no hands on  Assistive Device Front wheel walker  Distance Ambulated (ft) 20 ft  Activity Response Tolerated well  Mobility Referral Yes  $Mobility charge 1 Mobility   Pt received in bed and agreed to mobility, no c/o pain nor discomfort. Pt was moving slowly, nearing EOS pt's legs began to bend more and more due to fatigue.  Pt back to bed with all needs met.  Roderick Pee Mobility Specialist

## 2022-02-22 NOTE — Progress Notes (Signed)
Daily Progress Note   Patient Name: [name]       Date: 02/22/2022 DOB: 1945/06/05  Age: 76 y.o. MRN#: [id number] Attending Physician: Caren Griffins, MD Primary Care Physician: [place], Betty G, MD Admit Date: 02/16/2022  Reason for Consultation/Follow-up: Establishing goals of care  Subjective: Resting in bed, awaiting radiation today, sister at bedside  Length of Stay: 5  Current Medications: Scheduled Meds:   calcium-vitamin D  1 tablet Oral BID   Chlorhexidine Gluconate Cloth  6 each Topical Daily   cyanocobalamin  1,000 mcg Oral Daily   dexamethasone  4 mg Oral Q8H   docusate sodium  100 mg Oral BID   enoxaparin (LOVENOX) injection  40 mg Subcutaneous Q24H    HYDROmorphone (DILAUDID) injection  1 mg Intravenous Once   losartan  100 mg Oral Daily   morphine  30 mg Oral Q12H   pantoprazole  40 mg Oral QHS   polyethylene glycol  17 g Oral Daily   senna-docusate  1 tablet Oral QHS    Continuous Infusions:   PRN Meds: acetaminophen, alum & mag hydroxide-simeth, guaiFENesin, hydrALAZINE, HYDROmorphone (DILAUDID) injection, ipratropium-albuterol, lip balm, loratadine, LORazepam, meclizine, melatonin, metoprolol tartrate, ondansetron (ZOFRAN) IV, oxyCODONE, prochlorperazine, traZODone  Physical Exam         Awakens easily, no distress, verbalizes normally, regular work of breathing, abdomen not distended, Vital Signs: BP (!) 153/84 (BP Location: Right Arm)   Pulse 75   Temp (!) 97.5 F (36.4 C) (Oral)   Resp 16   Ht 5' 7.5" (1.715 m)   Wt 89.9 kg   SpO2 100%   BMI 30.58 kg/m  SpO2: SpO2: 100 % O2 Device: O2 Device: Room Air O2 Flow Rate: O2 Flow Rate (L/min): 2 L/min  Intake/output summary: No intake or output data in the 24 hours ending 02/22/22  1415 LBM: Last BM Date : 02/20/22 Baseline Weight: Weight: 89.9 kg Most recent weight: Weight: 89.9 kg       Palliative Assessment/Data:      Patient Active Problem List   Diagnosis Date Noted   Metastatic cancer to bone ([place]) 02/17/2022   Metastatic disease ([place]) 02/16/2022   Iliac artery aneurysm, right ([place]) 10/09/2021   Malignant neoplasm of overlapping sites of bladder ([place]) 08/21/2020   Bilateral leg edema 03/08/2020   Mild  concentric left ventricular hypertrophy (LVH) 11/25/2019   Prediabetes 11/25/2019   DDD (degenerative disc disease), thoracolumbar 86/16/8372   Diastolic dysfunction without heart failure 11/02/2019   Enchondroma of finger of right hand 07/22/2019   Slow transit constipation 07/22/2019   Venous insufficiency of both lower extremities 01/11/2019   Abdominal aortic atherosclerosis ([place]) 01/11/2019   Malignant neoplasm of left female breast ([place]) 01/11/2019   Cervical radiculopathy 07/07/2017   Hypokalemia 01/14/2014   Insomnia 01/14/2014   Mixed hyperlipidemia 12/14/2013   HTN (hypertension) 12/14/2013   GERD (gastroesophageal reflux disease) 12/14/2013   CAD (coronary artery disease) 12/14/2013   Depression 12/14/2013   Fibromyalgia 12/14/2013   Hip pain, left 12/14/2013    Palliative Care Assessment & Plan   Patient Profile:    Assessment: Static cancer History of breast cancer history of bladder cancer Cancer related pain  Recommendations/Plan: Continue current pain and on pain symptom management Follow-up with medical oncology in the outpatient setting Follow-up with palliative care in the outpatient setting  Goals of Care and Additional Recommendations: Limitations on Scope of Treatment: Full Scope Treatment  Code Status:    Code Status Orders  (From admission, onward)           Start     Ordered   02/17/22 0033  Full code  Continuous        02/17/22 0032           Code Status History     This patient has a  current code status but no historical code status.       Prognosis:  Unable to determine  Discharge Planning: To Be Determined  Care plan was discussed with  patient and sister  Also discussed with Dr Cruzita Lederer.   Thank you for allowing the Palliative Medicine Team to assist in the care of this patient.  Low MDM.     Greater than 50%  of this time was spent counseling and coordinating care related to the above assessment and plan.  Loistine Chance, MD  Please contact Palliative Medicine Team phone at [phone] for questions and concerns.

## 2022-02-22 NOTE — Telephone Encounter (Signed)
(  [name]) [facility]  Pt has been hospitalized at [facility]. Pt will be discharged soon. [name] would like to FU with Pt for Palliative Care. Is MD okay with that?  [phone] - option #2  Okay to leave a detailed message on this line.

## 2022-02-22 NOTE — TOC Progression Note (Signed)
Transition of Care [facility]) - Progression Note    Patient Details  Name: [name] MRN: [id number] Date of Birth: 04-02-1946  Transition of Care [facility]) CM/SW [place], LCSW Phone Number: 02/22/2022, 12:10 PM  Clinical Narrative:    Met with pt and sister at bedside and spoke with daughter via t/c while in the room. Pt is agreeable to outpatient palliative care and has agreed to referral to [facility]. Pt's family are also interested in RW, [facility], shower stool, bed pads, and depends for this pt.  A RW, BSC, and shower seat have been ordered through [employer] and will be delivered to pt's room. CSW shared that bed pads and depends will have to be private pay. Family will obtain depends from the store on their own. CSW has contacted [employer] to determine private pay cost for bed pads.  A referral has been made to [facility] for outpatient palliative care services.    Expected Discharge Plan: Home/Self Care Barriers to Discharge: Barriers Resolved  Expected Discharge Plan and Services Expected Discharge Plan: Home/Self Care In-house Referral: Hospice / Palliative Care Discharge Planning Services: NA Post Acute Care Choice: Durable Medical Equipment (Palliative care services) Living arrangements for the past 2 months: Apartment                 DME Arranged: Bedside commode, Shower stool, Walker rolling DME Agency: [employer] Date DME Agency Contacted: 02/22/22 Time DME Agency Contacted: 1209 Representative spoke with at DME Agency: [place] Determinants of Health ([place]) Interventions Housing Interventions: Intervention Not Indicated  Readmission Risk Interventions    02/22/2022   12:08 PM 02/18/2022    3:51 PM  Readmission Risk Prevention Plan  Transportation Screening Complete Complete  PCP or Specialist Appt within 5-7 Days Complete Complete  Home Care Screening Complete Complete  Medication Review (RN CM) Complete Complete

## 2022-02-22 NOTE — Progress Notes (Signed)
[facility]) Hospital Liaison note:  Notified by Normajean Baxter of request for [facility] Palliative Care services. Will continue to follow for disposition.  Please call with any outpatient palliative questions or concerns.  Thank you for the opportunity to participate in this patient's care.  Thank you, Lorelee Market, LPN [facility] Liaison [phone]

## 2022-02-24 NOTE — Telephone Encounter (Signed)
It is ok to start palliative care or hospice evaluation (if appropriate). Thanks, BJ

## 2022-02-25 ENCOUNTER — Other Ambulatory Visit: Payer: Self-pay

## 2022-02-25 ENCOUNTER — Telehealth: Payer: Self-pay

## 2022-02-25 ENCOUNTER — Ambulatory Visit
Admission: RE | Admit: 2022-02-25 | Discharge: 2022-02-25 | Disposition: A | Discharge status: Home / Self Care | Payer: Medicare Other | Source: Ambulatory Visit | Attending: Radiation Oncology | Admitting: Radiation Oncology

## 2022-02-25 DIAGNOSIS — C7951 Secondary malignant neoplasm of bone: Secondary | ICD-10-CM | POA: Diagnosis not present

## 2022-02-25 DIAGNOSIS — C799 Secondary malignant neoplasm of unspecified site: Secondary | ICD-10-CM | POA: Diagnosis not present

## 2022-02-25 DIAGNOSIS — D0502 Lobular carcinoma in situ of left breast: Secondary | ICD-10-CM | POA: Diagnosis not present

## 2022-02-25 DIAGNOSIS — Z510 Encounter for antineoplastic radiation therapy: Secondary | ICD-10-CM | POA: Diagnosis not present

## 2022-02-25 DIAGNOSIS — Z170 Estrogen receptor positive status [ER+]: Secondary | ICD-10-CM | POA: Diagnosis not present

## 2022-02-25 LAB — RAD ONC ARIA SESSION SUMMARY

## 2022-02-25 LAB — SURGICAL PATHOLOGY

## 2022-02-25 NOTE — Telephone Encounter (Signed)
/  Per MD, called pt for appt. She will come in 02/26/22 at 245 for MD visit. She knows to arrive 15 min early for check in process. Pt and sister, [name] verbalized understanding of all instructions.

## 2022-02-25 NOTE — Telephone Encounter (Signed)
Spoke with Mrs. Cavalieri and her sister Demetrius Charity) following up to see if see would be in this afternoon for radiation treatment.  Both agreed will be in this afternoon.  RN gave call back number to [place] in case anything changes and cannot make it.  Shona Simpson, PA-C and LINAC 4 made aware at this time.

## 2022-02-25 NOTE — Telephone Encounter (Signed)
-----   Message from Nicholas Lose, MD sent at 02/22/2022 12:31 PM EDT ----- Regarding: Needs an appt next week [name] Can you get her an appt with me next week? 15 mins is fine VG

## 2022-02-25 NOTE — Telephone Encounter (Signed)
I left a message for someone to return my call.

## 2022-02-25 NOTE — Telephone Encounter (Signed)
Verbal given to [facility].

## 2022-02-26 ENCOUNTER — Other Ambulatory Visit: Payer: Self-pay

## 2022-02-26 ENCOUNTER — Ambulatory Visit
Admission: RE | Admit: 2022-02-26 | Discharge: 2022-02-26 | Disposition: A | Discharge status: Home / Self Care | Payer: Medicare Other | Source: Ambulatory Visit | Attending: Radiation Oncology | Admitting: Radiation Oncology

## 2022-02-26 ENCOUNTER — Telehealth: Payer: Self-pay

## 2022-02-26 ENCOUNTER — Inpatient Hospital Stay (HOSPITAL_BASED_OUTPATIENT_CLINIC_OR_DEPARTMENT_OTHER): Payer: Medicare Other | Admitting: Hematology and Oncology

## 2022-02-26 VITALS — BP 144/66 | HR 67 | Temp 97.2°F | Resp 18 | Ht 67.5 in | Wt 192.7 lb

## 2022-02-26 DIAGNOSIS — Z87891 Personal history of nicotine dependence: Secondary | ICD-10-CM | POA: Insufficient documentation

## 2022-02-26 DIAGNOSIS — C7800 Secondary malignant neoplasm of unspecified lung: Secondary | ICD-10-CM | POA: Insufficient documentation

## 2022-02-26 DIAGNOSIS — C799 Secondary malignant neoplasm of unspecified site: Secondary | ICD-10-CM | POA: Diagnosis not present

## 2022-02-26 DIAGNOSIS — C7951 Secondary malignant neoplasm of bone: Secondary | ICD-10-CM | POA: Insufficient documentation

## 2022-02-26 DIAGNOSIS — M6281 Muscle weakness (generalized): Secondary | ICD-10-CM | POA: Diagnosis not present

## 2022-02-26 DIAGNOSIS — Z9012 Acquired absence of left breast and nipple: Secondary | ICD-10-CM | POA: Insufficient documentation

## 2022-02-26 DIAGNOSIS — R112 Nausea with vomiting, unspecified: Secondary | ICD-10-CM | POA: Insufficient documentation

## 2022-02-26 DIAGNOSIS — C50919 Malignant neoplasm of unspecified site of unspecified female breast: Secondary | ICD-10-CM | POA: Insufficient documentation

## 2022-02-26 DIAGNOSIS — Z171 Estrogen receptor negative status [ER-]: Secondary | ICD-10-CM

## 2022-02-26 DIAGNOSIS — Z79899 Other long term (current) drug therapy: Secondary | ICD-10-CM | POA: Insufficient documentation

## 2022-02-26 DIAGNOSIS — R222 Localized swelling, mass and lump, trunk: Secondary | ICD-10-CM | POA: Insufficient documentation

## 2022-02-26 DIAGNOSIS — B370 Candidal stomatitis: Secondary | ICD-10-CM | POA: Insufficient documentation

## 2022-02-26 DIAGNOSIS — R059 Cough, unspecified: Secondary | ICD-10-CM | POA: Insufficient documentation

## 2022-02-26 DIAGNOSIS — Z510 Encounter for antineoplastic radiation therapy: Secondary | ICD-10-CM | POA: Diagnosis not present

## 2022-02-26 DIAGNOSIS — Z8551 Personal history of malignant neoplasm of bladder: Secondary | ICD-10-CM | POA: Insufficient documentation

## 2022-02-26 DIAGNOSIS — Z923 Personal history of irradiation: Secondary | ICD-10-CM | POA: Insufficient documentation

## 2022-02-26 DIAGNOSIS — D0502 Lobular carcinoma in situ of left breast: Secondary | ICD-10-CM | POA: Diagnosis not present

## 2022-02-26 DIAGNOSIS — Z170 Estrogen receptor positive status [ER+]: Secondary | ICD-10-CM | POA: Diagnosis not present

## 2022-02-26 DIAGNOSIS — Z9221 Personal history of antineoplastic chemotherapy: Secondary | ICD-10-CM | POA: Insufficient documentation

## 2022-02-26 LAB — RAD ONC ARIA SESSION SUMMARY

## 2022-02-26 MED ORDER — OXYCODONE HCL 15 MG PO TABS: 15.0000 mg | ORAL_TABLET | Freq: Four times a day (QID) | ORAL | 0 refills | Status: AC | PRN

## 2022-02-26 MED ORDER — ONDANSETRON HCL 8 MG PO TABS: 8.0000 mg | ORAL_TABLET | Freq: Three times a day (TID) | ORAL | 3 refills | Status: AC | PRN

## 2022-02-26 MED ORDER — OXYCODONE HCL ER 15 MG PO T12A: 15.0000 mg | EXTENDED_RELEASE_TABLET | Freq: Two times a day (BID) | ORAL | 0 refills | Status: DC

## 2022-02-26 NOTE — Telephone Encounter (Signed)
Attempted to contact patient/ patient's son [name] to schedule a Palliative Care consult appointment. No answer left a message to return call on patient's number. Son's number is invalid.

## 2022-02-26 NOTE — Assessment & Plan Note (Signed)
Metastatic breast cancer with lung and bone metastases Right chest wall mass biopsy: Metastatic adenocarcinoma consistent with breast primary, triple negative, Ki-67 40%, HER2 1+ Counseling: I discussed with the patient that metastatic breast cancer cannot be cured but it can be treated.  Given the fact that she has triple negative breast cancer we do have to consider systemic chemotherapy options.  Recommendation: Xeloda versus Taxol Second line could be with Enhertu or Sacituzumab-Govitecan   
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
## 2022-02-27 ENCOUNTER — Ambulatory Visit
Admission: RE | Admit: 2022-02-27 | Discharge: 2022-02-27 | Disposition: A | Discharge status: Home / Self Care | Payer: Medicare Other | Source: Ambulatory Visit | Attending: Radiation Oncology | Admitting: Radiation Oncology

## 2022-02-27 ENCOUNTER — Other Ambulatory Visit: Payer: Self-pay

## 2022-02-27 ENCOUNTER — Encounter: Payer: Self-pay | Admitting: *Deleted

## 2022-02-27 ENCOUNTER — Telehealth: Payer: Self-pay

## 2022-02-27 DIAGNOSIS — C7951 Secondary malignant neoplasm of bone: Secondary | ICD-10-CM | POA: Diagnosis not present

## 2022-02-27 LAB — RAD ONC ARIA SESSION SUMMARY

## 2022-02-27 NOTE — Telephone Encounter (Signed)
Attempted to contact patient's son Elberta Fortis to schedule a Palliative Care consult appointment. No answer left a message to return call.

## 2022-02-27 NOTE — Progress Notes (Signed)
Per MD request RN successfully faxed [phone]) STAT [facility] report request on recent biopsy results.

## 2022-02-28 ENCOUNTER — Telehealth: Payer: Self-pay | Admitting: Licensed Clinical Social Worker

## 2022-02-28 ENCOUNTER — Other Ambulatory Visit: Payer: Self-pay | Admitting: *Deleted

## 2022-02-28 ENCOUNTER — Ambulatory Visit
Admission: RE | Admit: 2022-02-28 | Discharge: 2022-02-28 | Disposition: A | Discharge status: Home / Self Care | Payer: Medicare Other | Source: Ambulatory Visit | Attending: Radiation Oncology | Admitting: Radiation Oncology

## 2022-02-28 ENCOUNTER — Telehealth: Payer: Self-pay

## 2022-02-28 ENCOUNTER — Other Ambulatory Visit: Payer: Self-pay

## 2022-02-28 DIAGNOSIS — C7951 Secondary malignant neoplasm of bone: Secondary | ICD-10-CM

## 2022-02-28 DIAGNOSIS — Z170 Estrogen receptor positive status [ER+]: Secondary | ICD-10-CM | POA: Diagnosis not present

## 2022-02-28 DIAGNOSIS — D0502 Lobular carcinoma in situ of left breast: Secondary | ICD-10-CM | POA: Diagnosis not present

## 2022-02-28 DIAGNOSIS — C799 Secondary malignant neoplasm of unspecified site: Secondary | ICD-10-CM | POA: Diagnosis not present

## 2022-02-28 DIAGNOSIS — Z510 Encounter for antineoplastic radiation therapy: Secondary | ICD-10-CM | POA: Diagnosis not present

## 2022-02-28 LAB — RAD ONC ARIA SESSION SUMMARY

## 2022-02-28 NOTE — Telephone Encounter (Signed)
Return call from daughter, Ms. Sabra Heck, stating that pt is currently staying with her 76yo sister and they need assistance with home health/ aide services for ADLs and care part-time. Per daughter, she checked with insurance and this is covered for limited hours but they need an order from the physician.   CSW sent request to medical team for order for home health.   Tylia Ewell E Tyree Vandruff, LCSW

## 2022-02-28 NOTE — Telephone Encounter (Signed)
[name]  CSW received message from daughter with questions about home care services. CSW attempted to call back number left on VM [phone]). No answer. Left message with direct call back number.   Lun Muro E Kayce Betty, LCSW

## 2022-02-28 NOTE — Progress Notes (Signed)
Pt requesting [facility] RN and PT for strength training related to metastatic breast cancer.  Orders placed to [facility].

## 2022-02-28 NOTE — Telephone Encounter (Signed)
Notified Son Elberta Fortis) of prior authorization approval for Oxycontin 15mg  (Oxycodone 15mg  ER tablets) tablets. Medication is approved through 04/22/2023. No other needs or concerns voiced at this time.

## 2022-03-01 ENCOUNTER — Other Ambulatory Visit: Payer: Self-pay | Admitting: Radiation Oncology

## 2022-03-01 ENCOUNTER — Ambulatory Visit
Admission: RE | Admit: 2022-03-01 | Discharge: 2022-03-01 | Disposition: A | Discharge status: Home / Self Care | Payer: Medicare Other | Source: Ambulatory Visit | Attending: Radiation Oncology | Admitting: Radiation Oncology

## 2022-03-01 ENCOUNTER — Other Ambulatory Visit: Payer: Self-pay

## 2022-03-01 ENCOUNTER — Other Ambulatory Visit: Payer: Self-pay | Admitting: *Deleted

## 2022-03-01 DIAGNOSIS — K5790 Diverticulosis of intestine, part unspecified, without perforation or abscess without bleeding: Secondary | ICD-10-CM | POA: Diagnosis not present

## 2022-03-01 DIAGNOSIS — Z79891 Long term (current) use of opiate analgesic: Secondary | ICD-10-CM | POA: Diagnosis not present

## 2022-03-01 DIAGNOSIS — Z510 Encounter for antineoplastic radiation therapy: Secondary | ICD-10-CM | POA: Diagnosis not present

## 2022-03-01 DIAGNOSIS — C7802 Secondary malignant neoplasm of left lung: Secondary | ICD-10-CM | POA: Diagnosis not present

## 2022-03-01 DIAGNOSIS — C799 Secondary malignant neoplasm of unspecified site: Secondary | ICD-10-CM

## 2022-03-01 DIAGNOSIS — M797 Fibromyalgia: Secondary | ICD-10-CM | POA: Diagnosis not present

## 2022-03-01 DIAGNOSIS — D0502 Lobular carcinoma in situ of left breast: Secondary | ICD-10-CM | POA: Diagnosis not present

## 2022-03-01 DIAGNOSIS — C7951 Secondary malignant neoplasm of bone: Secondary | ICD-10-CM | POA: Diagnosis not present

## 2022-03-01 DIAGNOSIS — M1990 Unspecified osteoarthritis, unspecified site: Secondary | ICD-10-CM | POA: Diagnosis not present

## 2022-03-01 DIAGNOSIS — Z7951 Long term (current) use of inhaled steroids: Secondary | ICD-10-CM | POA: Diagnosis not present

## 2022-03-01 DIAGNOSIS — I10 Essential (primary) hypertension: Secondary | ICD-10-CM | POA: Diagnosis not present

## 2022-03-01 DIAGNOSIS — D630 Anemia in neoplastic disease: Secondary | ICD-10-CM | POA: Diagnosis not present

## 2022-03-01 DIAGNOSIS — E785 Hyperlipidemia, unspecified: Secondary | ICD-10-CM | POA: Diagnosis not present

## 2022-03-01 DIAGNOSIS — Z9181 History of falling: Secondary | ICD-10-CM | POA: Diagnosis not present

## 2022-03-01 DIAGNOSIS — I2510 Atherosclerotic heart disease of native coronary artery without angina pectoris: Secondary | ICD-10-CM | POA: Diagnosis not present

## 2022-03-01 DIAGNOSIS — K219 Gastro-esophageal reflux disease without esophagitis: Secondary | ICD-10-CM | POA: Diagnosis not present

## 2022-03-01 DIAGNOSIS — C7911 Secondary malignant neoplasm of bladder: Secondary | ICD-10-CM | POA: Diagnosis not present

## 2022-03-01 DIAGNOSIS — Z170 Estrogen receptor positive status [ER+]: Secondary | ICD-10-CM | POA: Diagnosis not present

## 2022-03-01 DIAGNOSIS — Z853 Personal history of malignant neoplasm of breast: Secondary | ICD-10-CM | POA: Diagnosis not present

## 2022-03-01 DIAGNOSIS — Z87891 Personal history of nicotine dependence: Secondary | ICD-10-CM | POA: Diagnosis not present

## 2022-03-01 LAB — RAD ONC ARIA SESSION SUMMARY
Course Elapsed Days: 9
Plan Fractions Treated to Date: 8
Plan Fractions Treated to Date: 8
Plan Prescribed Dose Per Fraction: 3 Gy
Plan Prescribed Dose Per Fraction: 3 Gy
Plan Total Fractions Prescribed: 10
Plan Total Fractions Prescribed: 10
Plan Total Prescribed Dose: 30 Gy
Plan Total Prescribed Dose: 30 Gy
Reference Point Dosage Given to Date: 24 Gy
Reference Point Dosage Given to Date: 24 Gy
Reference Point Session Dosage Given: 3 Gy
Reference Point Session Dosage Given: 3 Gy
Session Number: 8

## 2022-03-01 MED ORDER — SUCRALFATE 1 G PO TABS: 1.0000 g | ORAL_TABLET | Freq: Three times a day (TID) | ORAL | 2 refills | Status: DC

## 2022-03-01 MED ORDER — SONAFINE EX EMUL
1.0000 | Freq: Two times a day (BID) | CUTANEOUS | Status: DC
  Administered 2022-03-01: 1 via TOPICAL

## 2022-03-01 NOTE — Progress Notes (Signed)
Pt requesting or for in home aid and personal care giver be faxed to [facility].  RN successfully faxed referral to [phone]).

## 2022-03-02 NOTE — Progress Notes (Signed)
Patient Care Team: Martinique, Betty G, MD as PCP - General (Family Medicine) Croitoru, Dani Gobble, MD as PCP - Cardiology (Cardiology) Fontaine, Belinda Block, MD (Inactive) as Consulting Physician (Gynecology) Magrinat, Virgie Dad, MD (Inactive) as Consulting Physician (Oncology) Juanita Craver, MD as Consulting Physician (Gastroenterology) Charolette Forward, MD as Consulting Physician (Cardiology)  DIAGNOSIS: No diagnosis found.  SUMMARY OF ONCOLOGIC HISTORY: Oncology History  Metastatic cancer to bone ([place])  12/1998 Initial Diagnosis   Dr. Virgie Dad patient originally left modified radical mastectomy and ALND: 6.2 cm mixed breast cancer 2/21 lymph nodes positive, ER/PR positive, HER2 negative, treated with Adriamycin Cytoxan x4 followed by Taxotere x4 followed by radiation and tamoxifen followed by anastrozole   03/21/2020 Miscellaneous   Noninvasive high-grade urothelial cancer with early papillary formation status post TURBT and intravesical BCG   02/16/2022 Imaging   CT chest: 4.1 cm left upper lobe lung mass, innumerable micronodules, osseous metastatic disease right eighth rib left seventh rib T10, T11, acute fracture right seventh rib.  MRI spine: Multifocal bone metastases throughout the spine and ribs sacrum and iliac bones.  MRI brain: Osseous lesions left and right parietal bones, right frontal bone, left mandible   02/18/2022 Relapse/Recurrence   Right chest wall needle core biopsy: Metastatic adenocarcinoma positive for CK7 negative for CK20, GATA3, ER, PAX8 and TTF-1, consistent with breast primary, ER 0%, PR 0%, HER2 1+, Ki-67 40%     CHIEF COMPLIANT: Follow-up metastatic breast cancer  INTERVAL HISTORY: [name] is a 76 y.o. female is here because of recent diagnosis of metastatic breast cancer. She presents to the clinic for a follow-up.    ALLERGIES:  is allergic to hydrochlorothiazide, flomax [tamsulosin], codeine, statins, and zetia [ezetimibe].  MEDICATIONS:   Current Outpatient Medications  Medication Sig Dispense Refill   acetaminophen (TYLENOL) 325 MG tablet Take 650 mg by mouth every 6 (six) hours as needed for mild pain.     BIOTIN PO Take 1 tablet by mouth daily.     Calcium Citrate-Vitamin D (CALCIUM CITRATE + D3 PO) Take 1 tablet by mouth 2 (two) times daily.     carboxymethylcellulose (REFRESH PLUS) 0.5 % SOLN Place 1 drop into both eyes daily as needed (dry eyes).     cetirizine (ZYRTEC) 10 MG tablet Take 10 mg by mouth daily as needed for allergies.     cyanocobalamin 1000 MCG tablet Take 1,000 mcg by mouth daily. Vitamin b 12     furosemide (LASIX) 20 MG tablet Take 1 tablet (20 mg total) by mouth daily. 30 tablet 2   ibuprofen (ADVIL) 200 MG tablet Take 800 mg by mouth every 6 (six) hours as needed for mild pain.     losartan (COZAAR) 100 MG tablet Take 1 tablet (100 mg total) by mouth daily. 90 tablet 3   meclizine (ANTIVERT) 25 MG tablet TAKE 1 TABLET BY MOUTH 3  TIMES DAILY AS NEEDED FOR  DIZZINESS (Patient taking differently: Take 25 mg by mouth 3 (three) times daily as needed for dizziness.) 90 tablet 1   Menthol, Topical Analgesic, (BIOFREEZE ROLL-ON EX) Apply 1 Application topically 2 (two) times daily as needed (pain).     ondansetron (ZOFRAN) 8 MG tablet Take 1 tablet (8 mg total) by mouth every 8 (eight) hours as needed for nausea or vomiting. 20 tablet 3   oxyCODONE (OXYCONTIN) 15 mg 12 hr tablet Take 1 tablet (15 mg total) by mouth every 12 (twelve) hours. 60 tablet 0   oxyCODONE (ROXICODONE) 15 MG immediate release  tablet Take 1 tablet (15 mg total) by mouth every 6 (six) hours as needed. 90 tablet 0   pantoprazole (PROTONIX) 40 MG tablet Take 1 tablet (40 mg total) by mouth daily. (Patient taking differently: Take 40 mg by mouth at bedtime.) 90 tablet 1   polyethylene glycol (MIRALAX / GLYCOLAX) 17 g packet Take 17 g by mouth daily as needed for mild constipation.     potassium chloride SA (KLOR-CON M) 20 MEQ tablet TAKE 1  TABLET BY MOUTH  DAILY (Patient taking differently: 20 mEq at bedtime. TAKE 1 TABLET BY MOUTH  DAILY) 90 tablet 3   Probiotic Product (ALIGN PO) Take 1 tablet by mouth daily.      rosuvastatin (CRESTOR) 10 MG tablet 2 to 3 x week in am (Patient taking differently: Take 10 mg by mouth 2 (two) times a week. 2 to 3 x week in am) 36 tablet 2   sucralfate (CARAFATE) 1 g tablet Take 1 tablet (1 g total) by mouth 4 (four) times daily -  with meals and at bedtime. 5 min before meals for radiation induced esophagitis 120 tablet 2   UNABLE TO FIND Flucinonide usp 0.05 % cream prn for eczema     No current facility-administered medications for this visit.    PHYSICAL EXAMINATION: ECOG PERFORMANCE STATUS: {CHL ONC ECOG PS:[phone]}  There were no vitals filed for this visit. There were no vitals filed for this visit.  BREAST:*** No palpable masses or nodules in either right or left breasts. No palpable axillary supraclavicular or infraclavicular adenopathy no breast tenderness or nipple discharge. (exam performed in the presence of a chaperone)  LABORATORY DATA:  I have reviewed the data as listed    Latest Ref Rng & Units 02/21/2022    5:22 AM 02/17/2022    7:24 AM 02/16/2022    2:18 PM  CMP  Glucose 70 - 99 mg/dL 118  114  96   BUN 8 - 23 mg/dL _0 Creatinine 0.44 - 1.00 mg/dL 0.72  0.61  0.71   Sodium 135 - 145 mmol/L 133  137  142   Potassium 3.5 - 5.1 mmol/L 4.6  3.8  3.9   Chloride 98 - 111 mmol/L 100  105  104   CO2 22 - 32 mmol/L _1 Calcium 8.9 - 10.3 mg/dL 10.2  9.8  11.0   Total Protein 6.5 - 8.1 g/dL   7.3   Total Bilirubin 0.3 - 1.2 mg/dL   0.5   Alkaline Phos 38 - 126 U/L   113   AST 15 - 41 U/L   14   ALT 0 - 44 U/L   <5     Lab Results  Component Value Date   WBC 8.3 02/21/2022   HGB 11.1 (L) 02/21/2022   HCT 35.8 (L) 02/21/2022   MCV 82.3 02/21/2022   PLT 251 02/21/2022   NEUTROABS 2.8 10/11/2019    ASSESSMENT & PLAN:  No problem-specific  Assessment & Plan notes found for this encounter.    No orders of the defined types were placed in this encounter.  The patient has a good understanding of the overall plan. she agrees with it. she will call with any problems that may develop before the next visit here. Total time spent: 30 mins including face to face time and time spent for planning, charting and co-ordination of care   Suzzette Righter, [facility] 03/02/22  I Alera Quevedo, Shawana Knoch am scribing for Dr. Gudena  ***  

## 2022-03-04 ENCOUNTER — Inpatient Hospital Stay: Payer: Medicare Other

## 2022-03-04 ENCOUNTER — Other Ambulatory Visit: Payer: Self-pay | Admitting: Hematology and Oncology

## 2022-03-04 ENCOUNTER — Telehealth: Payer: Self-pay

## 2022-03-04 ENCOUNTER — Other Ambulatory Visit: Payer: Self-pay

## 2022-03-04 ENCOUNTER — Encounter: Payer: Self-pay | Admitting: Hematology and Oncology

## 2022-03-04 ENCOUNTER — Inpatient Hospital Stay (HOSPITAL_BASED_OUTPATIENT_CLINIC_OR_DEPARTMENT_OTHER): Payer: Medicare Other | Admitting: Hematology and Oncology

## 2022-03-04 ENCOUNTER — Ambulatory Visit
Admission: RE | Admit: 2022-03-04 | Discharge: 2022-03-04 | Disposition: A | Discharge status: Home / Self Care | Payer: Medicare Other | Source: Ambulatory Visit | Attending: Radiation Oncology | Admitting: Radiation Oncology

## 2022-03-04 ENCOUNTER — Other Ambulatory Visit (HOSPITAL_COMMUNITY): Payer: Self-pay

## 2022-03-04 VITALS — BP 143/81 | HR 90 | Temp 97.8°F | Resp 18 | Ht 67.5 in | Wt 187.8 lb

## 2022-03-04 DIAGNOSIS — C799 Secondary malignant neoplasm of unspecified site: Secondary | ICD-10-CM

## 2022-03-04 DIAGNOSIS — Z170 Estrogen receptor positive status [ER+]: Secondary | ICD-10-CM | POA: Diagnosis not present

## 2022-03-04 DIAGNOSIS — C7951 Secondary malignant neoplasm of bone: Secondary | ICD-10-CM | POA: Diagnosis not present

## 2022-03-04 DIAGNOSIS — Z510 Encounter for antineoplastic radiation therapy: Secondary | ICD-10-CM | POA: Diagnosis not present

## 2022-03-04 DIAGNOSIS — D0502 Lobular carcinoma in situ of left breast: Secondary | ICD-10-CM | POA: Diagnosis not present

## 2022-03-04 LAB — CBC WITH DIFFERENTIAL (CANCER CENTER ONLY)
Abs Immature Granulocytes: 0.05 10*3/uL (ref 0.00–0.07)
Basophils Absolute: 0 10*3/uL (ref 0.0–0.1)
Basophils Relative: 0 %
Eosinophils Absolute: 0 10*3/uL (ref 0.0–0.5)
Eosinophils Relative: 1 %
HCT: 41.3 % (ref 36.0–46.0)
Hemoglobin: 13 g/dL (ref 12.0–15.0)
Immature Granulocytes: 1 %
Lymphocytes Relative: 4 %
Lymphs Abs: 0.3 10*3/uL — ABNORMAL LOW (ref 0.7–4.0)
MCH: 25.9 pg — ABNORMAL LOW (ref 26.0–34.0)
MCHC: 31.5 g/dL (ref 30.0–36.0)
MCV: 82.4 fL (ref 80.0–100.0)
Monocytes Absolute: 0.7 10*3/uL (ref 0.1–1.0)
Monocytes Relative: 10 %
Neutro Abs: 5.8 10*3/uL (ref 1.7–7.7)
Neutrophils Relative %: 84 %
Platelet Count: 188 10*3/uL (ref 150–400)
RBC: 5.01 MIL/uL (ref 3.87–5.11)
RDW: 14.7 % (ref 11.5–15.5)
WBC Count: 6.8 10*3/uL (ref 4.0–10.5)
nRBC: 0 % (ref 0.0–0.2)

## 2022-03-04 LAB — RAD ONC ARIA SESSION SUMMARY

## 2022-03-04 LAB — CMP (CANCER CENTER ONLY)
ALT: 5 U/L (ref 0–44)
AST: 11 U/L — ABNORMAL LOW (ref 15–41)
Albumin: 4.1 g/dL (ref 3.5–5.0)
Alkaline Phosphatase: 110 U/L (ref 38–126)
Anion gap: 5 (ref 5–15)
BUN: 9 mg/dL (ref 8–23)
CO2: 30 mmol/L (ref 22–32)
Calcium: 10.4 mg/dL — ABNORMAL HIGH (ref 8.9–10.3)
Chloride: 97 mmol/L — ABNORMAL LOW (ref 98–111)
Creatinine: 0.58 mg/dL (ref 0.44–1.00)
GFR, Estimated: >60 mL/min (ref >60)
Glucose, Bld: 105 mg/dL — ABNORMAL HIGH (ref 70–99)
Potassium: 4.3 mmol/L (ref 3.5–5.1)
Sodium: 132 mmol/L — ABNORMAL LOW (ref 135–145)
Total Bilirubin: 0.7 mg/dL (ref 0.3–1.2)
Total Protein: 6.6 g/dL (ref 6.5–8.1)

## 2022-03-04 MED ORDER — FLUCONAZOLE 100 MG PO TABS
100.0000 mg | ORAL_TABLET | Freq: Every day | ORAL | 1 refills | Status: DC
  Filled 2022-03-04: qty 14, 14d supply, fill #0

## 2022-03-04 MED ORDER — DENOSUMAB 120 MG/1.7ML ~~LOC~~ SOLN
120.0000 mg | Freq: Once | SUBCUTANEOUS | Status: DC
  Filled 2022-03-04: qty 1.7

## 2022-03-04 MED ORDER — SODIUM CHLORIDE 0.9 % IV SOLN: 8.0000 mg | Freq: Once | INTRAVENOUS | Status: AC

## 2022-03-04 MED ORDER — SODIUM CHLORIDE 0.9% FLUSH
10.0000 mL | Freq: Once | INTRAVENOUS | Status: AC | PRN
  Administered 2022-03-04: 10 mL

## 2022-03-04 MED ORDER — SODIUM CHLORIDE 0.9 % IV SOLN: Freq: Once | INTRAVENOUS | Status: AC

## 2022-03-04 MED ORDER — ZOLEDRONIC ACID 4 MG/100ML IV SOLN
4.0000 mg | Freq: Once | INTRAVENOUS | Status: AC
  Administered 2022-03-04: 4 mg via INTRAVENOUS
  Filled 2022-03-04: qty 100

## 2022-03-04 MED ORDER — HEPARIN SOD (PORK) LOCK FLUSH 100 UNIT/ML IV SOLN
500.0000 [IU] | Freq: Once | INTRAVENOUS | Status: AC | PRN
  Administered 2022-03-04: 500 [IU]

## 2022-03-04 MED ORDER — ONDANSETRON HCL 4 MG/2ML IJ SOLN
INTRAMUSCULAR | Status: AC
  Administered 2022-03-04: 8 mg
  Filled 2022-03-04: qty 4

## 2022-03-04 MED ORDER — OXYCODONE HCL ER 30 MG PO T12A
30.0000 mg | EXTENDED_RELEASE_TABLET | Freq: Two times a day (BID) | ORAL | 0 refills | Status: AC
  Filled 2022-03-04: qty 60, 30d supply, fill #0

## 2022-03-04 NOTE — Telephone Encounter (Signed)
Nutrition  Patient identified on Malnutrition Screening report for poor appetite and weight loss.  Called patient but no answer.  Left message with contact information.   Ekansh Sherk B. Zenia Resides, [place], [place] Registered Dietitian [phone]

## 2022-03-04 NOTE — Patient Instructions (Addendum)

## 2022-03-04 NOTE — Telephone Encounter (Signed)
Attempted to contact patient, patient's son [name] and patient's daughter [name] to schedule a Palliative Care consult appointment. No answer left a message to return call.

## 2022-03-04 NOTE — Telephone Encounter (Signed)
Spoke with patient's son Elberta Fortis and scheduled a Mychart Palliative Consult for 03/11/22 @ 2 PM.  Consent obtained; updated Netsmart, Team List and Epic.

## 2022-03-04 NOTE — Progress Notes (Signed)
Delton See has not been authorized. Therefore I ordered Zometa which does not require authorization.

## 2022-03-04 NOTE — Progress Notes (Signed)
Per Dr. Lindi Adie OK to proceed with Zometa without dental clearance d/t hypercalcemia and bone pain

## 2022-03-04 NOTE — Assessment & Plan Note (Addendum)
Metastatic breast cancer with lung and bone metastases Right chest wall mass biopsy: Metastatic adenocarcinoma consistent with breast primary, triple negative, Ki-67 40%, HER2 1+  Recommendation: [facility] molecular testing to determine if she would benefit from immunotherapy. If PD-L1 is negative then we will consider treatment with Xeloda or Taxol or Trodelvy. If PD-L1 is positive then we will treat her with [facility] immunotherapy. Bone metastases: She will start Xgeva today.   Pain control: Currently on OxyContin and oxycodone. Currently pain is at 10/10 Will increase dose of Oxycontin to 30 mg bid They had difficulty getting it authorized.  Thrush: Causing severe throat pain: sent for Diflucan.  Return to clinic after the [facility] molecular testing results are available. We requested an expedited analysis.

## 2022-03-04 NOTE — Progress Notes (Signed)
Pt presented to infusion today with worsening nausea and vomiting. This RN made Dr.Gudena and Sonda Rumble aware.  Sonda Rumble assessed Pt chairside.  This RN received V/O from Sonda Rumble for Zofran 8mg  IV to be given in infusion today for nausea and vomiting.  At 1730 Pt stated "I feel better" as she was being discharged home from infusion.

## 2022-03-05 ENCOUNTER — Other Ambulatory Visit: Payer: Self-pay

## 2022-03-05 ENCOUNTER — Encounter: Payer: Medicare Other | Admitting: Nurse Practitioner

## 2022-03-05 ENCOUNTER — Encounter: Payer: Self-pay | Admitting: Urology

## 2022-03-05 ENCOUNTER — Ambulatory Visit
Admission: RE | Admit: 2022-03-05 | Discharge: 2022-03-05 | Disposition: A | Discharge status: Home / Self Care | Payer: Medicare Other | Source: Ambulatory Visit | Attending: Radiation Oncology | Admitting: Radiation Oncology

## 2022-03-05 ENCOUNTER — Encounter: Payer: Self-pay | Admitting: Family Medicine

## 2022-03-05 DIAGNOSIS — D0502 Lobular carcinoma in situ of left breast: Secondary | ICD-10-CM | POA: Diagnosis not present

## 2022-03-05 DIAGNOSIS — K219 Gastro-esophageal reflux disease without esophagitis: Secondary | ICD-10-CM

## 2022-03-05 DIAGNOSIS — C799 Secondary malignant neoplasm of unspecified site: Secondary | ICD-10-CM | POA: Diagnosis not present

## 2022-03-05 DIAGNOSIS — Z510 Encounter for antineoplastic radiation therapy: Secondary | ICD-10-CM | POA: Diagnosis not present

## 2022-03-05 DIAGNOSIS — Z170 Estrogen receptor positive status [ER+]: Secondary | ICD-10-CM | POA: Diagnosis not present

## 2022-03-05 DIAGNOSIS — C7951 Secondary malignant neoplasm of bone: Secondary | ICD-10-CM | POA: Diagnosis not present

## 2022-03-05 LAB — RAD ONC ARIA SESSION SUMMARY

## 2022-03-06 ENCOUNTER — Other Ambulatory Visit: Payer: Self-pay | Admitting: Pharmacist

## 2022-03-06 ENCOUNTER — Other Ambulatory Visit: Payer: Self-pay | Admitting: Family Medicine

## 2022-03-06 DIAGNOSIS — M1990 Unspecified osteoarthritis, unspecified site: Secondary | ICD-10-CM | POA: Diagnosis not present

## 2022-03-06 DIAGNOSIS — K5790 Diverticulosis of intestine, part unspecified, without perforation or abscess without bleeding: Secondary | ICD-10-CM | POA: Diagnosis not present

## 2022-03-06 DIAGNOSIS — K219 Gastro-esophageal reflux disease without esophagitis: Secondary | ICD-10-CM

## 2022-03-06 DIAGNOSIS — I10 Essential (primary) hypertension: Secondary | ICD-10-CM | POA: Diagnosis not present

## 2022-03-06 DIAGNOSIS — Z853 Personal history of malignant neoplasm of breast: Secondary | ICD-10-CM | POA: Diagnosis not present

## 2022-03-06 DIAGNOSIS — C7951 Secondary malignant neoplasm of bone: Secondary | ICD-10-CM | POA: Diagnosis not present

## 2022-03-06 DIAGNOSIS — C7911 Secondary malignant neoplasm of bladder: Secondary | ICD-10-CM | POA: Diagnosis not present

## 2022-03-06 DIAGNOSIS — Z87891 Personal history of nicotine dependence: Secondary | ICD-10-CM | POA: Diagnosis not present

## 2022-03-06 DIAGNOSIS — C7802 Secondary malignant neoplasm of left lung: Secondary | ICD-10-CM | POA: Diagnosis not present

## 2022-03-06 DIAGNOSIS — Z7951 Long term (current) use of inhaled steroids: Secondary | ICD-10-CM | POA: Diagnosis not present

## 2022-03-06 DIAGNOSIS — M797 Fibromyalgia: Secondary | ICD-10-CM | POA: Diagnosis not present

## 2022-03-06 DIAGNOSIS — Z79891 Long term (current) use of opiate analgesic: Secondary | ICD-10-CM | POA: Diagnosis not present

## 2022-03-06 DIAGNOSIS — I2510 Atherosclerotic heart disease of native coronary artery without angina pectoris: Secondary | ICD-10-CM | POA: Diagnosis not present

## 2022-03-06 DIAGNOSIS — Z9181 History of falling: Secondary | ICD-10-CM | POA: Diagnosis not present

## 2022-03-06 DIAGNOSIS — D630 Anemia in neoplastic disease: Secondary | ICD-10-CM | POA: Diagnosis not present

## 2022-03-06 DIAGNOSIS — E785 Hyperlipidemia, unspecified: Secondary | ICD-10-CM | POA: Diagnosis not present

## 2022-03-06 MED ORDER — PANTOPRAZOLE SODIUM 40 MG PO TBEC: 40.0000 mg | DELAYED_RELEASE_TABLET | Freq: Every day | ORAL | 0 refills | Status: DC

## 2022-03-07 DIAGNOSIS — K5790 Diverticulosis of intestine, part unspecified, without perforation or abscess without bleeding: Secondary | ICD-10-CM | POA: Diagnosis not present

## 2022-03-07 DIAGNOSIS — Z7951 Long term (current) use of inhaled steroids: Secondary | ICD-10-CM | POA: Diagnosis not present

## 2022-03-07 DIAGNOSIS — Z9181 History of falling: Secondary | ICD-10-CM | POA: Diagnosis not present

## 2022-03-07 DIAGNOSIS — M1990 Unspecified osteoarthritis, unspecified site: Secondary | ICD-10-CM | POA: Diagnosis not present

## 2022-03-07 DIAGNOSIS — D630 Anemia in neoplastic disease: Secondary | ICD-10-CM | POA: Diagnosis not present

## 2022-03-07 DIAGNOSIS — C7802 Secondary malignant neoplasm of left lung: Secondary | ICD-10-CM | POA: Diagnosis not present

## 2022-03-07 DIAGNOSIS — Z87891 Personal history of nicotine dependence: Secondary | ICD-10-CM | POA: Diagnosis not present

## 2022-03-07 DIAGNOSIS — C7951 Secondary malignant neoplasm of bone: Secondary | ICD-10-CM | POA: Diagnosis not present

## 2022-03-07 DIAGNOSIS — E785 Hyperlipidemia, unspecified: Secondary | ICD-10-CM | POA: Diagnosis not present

## 2022-03-07 DIAGNOSIS — Z853 Personal history of malignant neoplasm of breast: Secondary | ICD-10-CM | POA: Diagnosis not present

## 2022-03-07 DIAGNOSIS — M797 Fibromyalgia: Secondary | ICD-10-CM | POA: Diagnosis not present

## 2022-03-07 DIAGNOSIS — Z79891 Long term (current) use of opiate analgesic: Secondary | ICD-10-CM | POA: Diagnosis not present

## 2022-03-07 DIAGNOSIS — C7911 Secondary malignant neoplasm of bladder: Secondary | ICD-10-CM | POA: Diagnosis not present

## 2022-03-07 DIAGNOSIS — I2510 Atherosclerotic heart disease of native coronary artery without angina pectoris: Secondary | ICD-10-CM | POA: Diagnosis not present

## 2022-03-07 DIAGNOSIS — I10 Essential (primary) hypertension: Secondary | ICD-10-CM | POA: Diagnosis not present

## 2022-03-07 DIAGNOSIS — K219 Gastro-esophageal reflux disease without esophagitis: Secondary | ICD-10-CM | POA: Diagnosis not present

## 2022-03-08 DIAGNOSIS — M797 Fibromyalgia: Secondary | ICD-10-CM | POA: Diagnosis not present

## 2022-03-08 DIAGNOSIS — D630 Anemia in neoplastic disease: Secondary | ICD-10-CM | POA: Diagnosis not present

## 2022-03-08 DIAGNOSIS — I2510 Atherosclerotic heart disease of native coronary artery without angina pectoris: Secondary | ICD-10-CM | POA: Diagnosis not present

## 2022-03-08 DIAGNOSIS — K219 Gastro-esophageal reflux disease without esophagitis: Secondary | ICD-10-CM | POA: Diagnosis not present

## 2022-03-08 DIAGNOSIS — Z7951 Long term (current) use of inhaled steroids: Secondary | ICD-10-CM | POA: Diagnosis not present

## 2022-03-08 DIAGNOSIS — Z853 Personal history of malignant neoplasm of breast: Secondary | ICD-10-CM | POA: Diagnosis not present

## 2022-03-08 DIAGNOSIS — Z9181 History of falling: Secondary | ICD-10-CM | POA: Diagnosis not present

## 2022-03-08 DIAGNOSIS — I10 Essential (primary) hypertension: Secondary | ICD-10-CM | POA: Diagnosis not present

## 2022-03-08 DIAGNOSIS — C7802 Secondary malignant neoplasm of left lung: Secondary | ICD-10-CM | POA: Diagnosis not present

## 2022-03-08 DIAGNOSIS — M1990 Unspecified osteoarthritis, unspecified site: Secondary | ICD-10-CM | POA: Diagnosis not present

## 2022-03-08 DIAGNOSIS — Z87891 Personal history of nicotine dependence: Secondary | ICD-10-CM | POA: Diagnosis not present

## 2022-03-08 DIAGNOSIS — C7911 Secondary malignant neoplasm of bladder: Secondary | ICD-10-CM | POA: Diagnosis not present

## 2022-03-08 DIAGNOSIS — Z79891 Long term (current) use of opiate analgesic: Secondary | ICD-10-CM | POA: Diagnosis not present

## 2022-03-08 DIAGNOSIS — K5790 Diverticulosis of intestine, part unspecified, without perforation or abscess without bleeding: Secondary | ICD-10-CM | POA: Diagnosis not present

## 2022-03-08 DIAGNOSIS — E785 Hyperlipidemia, unspecified: Secondary | ICD-10-CM | POA: Diagnosis not present

## 2022-03-08 DIAGNOSIS — C7951 Secondary malignant neoplasm of bone: Secondary | ICD-10-CM | POA: Diagnosis not present

## 2022-03-10 ENCOUNTER — Emergency Department (HOSPITAL_COMMUNITY): Payer: Medicare Other

## 2022-03-10 ENCOUNTER — Inpatient Hospital Stay (HOSPITAL_COMMUNITY)
Admission: EM | Admit: 2022-03-10 | Discharge: 2022-03-16 | DRG: 392 | Disposition: A | Discharge status: Home / Self Care | Payer: Medicare Other | Attending: Internal Medicine | Admitting: Internal Medicine

## 2022-03-10 ENCOUNTER — Other Ambulatory Visit: Payer: Self-pay

## 2022-03-10 ENCOUNTER — Encounter (HOSPITAL_COMMUNITY): Payer: Self-pay

## 2022-03-10 DIAGNOSIS — Z9012 Acquired absence of left breast and nipple: Secondary | ICD-10-CM

## 2022-03-10 DIAGNOSIS — R7303 Prediabetes: Secondary | ICD-10-CM | POA: Diagnosis present

## 2022-03-10 DIAGNOSIS — I110 Hypertensive heart disease with heart failure: Secondary | ICD-10-CM | POA: Diagnosis present

## 2022-03-10 DIAGNOSIS — T66XXXA Radiation sickness, unspecified, initial encounter: Secondary | ICD-10-CM | POA: Diagnosis present

## 2022-03-10 DIAGNOSIS — Z885 Allergy status to narcotic agent status: Secondary | ICD-10-CM

## 2022-03-10 DIAGNOSIS — Z888 Allergy status to other drugs, medicaments and biological substances status: Secondary | ICD-10-CM

## 2022-03-10 DIAGNOSIS — C7951 Secondary malignant neoplasm of bone: Secondary | ICD-10-CM | POA: Diagnosis present

## 2022-03-10 DIAGNOSIS — E876 Hypokalemia: Secondary | ICD-10-CM

## 2022-03-10 DIAGNOSIS — R531 Weakness: Secondary | ICD-10-CM | POA: Diagnosis not present

## 2022-03-10 DIAGNOSIS — D72819 Decreased white blood cell count, unspecified: Secondary | ICD-10-CM | POA: Diagnosis present

## 2022-03-10 DIAGNOSIS — K208 Other esophagitis without bleeding: Principal | ICD-10-CM | POA: Diagnosis present

## 2022-03-10 DIAGNOSIS — E860 Dehydration: Secondary | ICD-10-CM | POA: Diagnosis present

## 2022-03-10 DIAGNOSIS — Z79899 Other long term (current) drug therapy: Secondary | ICD-10-CM

## 2022-03-10 DIAGNOSIS — Z9221 Personal history of antineoplastic chemotherapy: Secondary | ICD-10-CM

## 2022-03-10 DIAGNOSIS — Z6828 Body mass index (BMI) 28.0-28.9, adult: Secondary | ICD-10-CM

## 2022-03-10 DIAGNOSIS — R627 Adult failure to thrive: Secondary | ICD-10-CM | POA: Diagnosis present

## 2022-03-10 DIAGNOSIS — I5032 Chronic diastolic (congestive) heart failure: Secondary | ICD-10-CM | POA: Diagnosis present

## 2022-03-10 DIAGNOSIS — R824 Acetonuria: Secondary | ICD-10-CM | POA: Diagnosis present

## 2022-03-10 DIAGNOSIS — Z83719 Family history of colon polyps, unspecified: Secondary | ICD-10-CM

## 2022-03-10 DIAGNOSIS — Z87442 Personal history of urinary calculi: Secondary | ICD-10-CM

## 2022-03-10 DIAGNOSIS — R4701 Aphasia: Secondary | ICD-10-CM | POA: Diagnosis present

## 2022-03-10 DIAGNOSIS — Z8603 Personal history of neoplasm of uncertain behavior: Secondary | ICD-10-CM

## 2022-03-10 DIAGNOSIS — Z923 Personal history of irradiation: Secondary | ICD-10-CM

## 2022-03-10 DIAGNOSIS — R079 Chest pain, unspecified: Secondary | ICD-10-CM | POA: Diagnosis not present

## 2022-03-10 DIAGNOSIS — R1310 Dysphagia, unspecified: Secondary | ICD-10-CM | POA: Diagnosis present

## 2022-03-10 DIAGNOSIS — Z8551 Personal history of malignant neoplasm of bladder: Secondary | ICD-10-CM

## 2022-03-10 DIAGNOSIS — I10 Essential (primary) hypertension: Secondary | ICD-10-CM | POA: Diagnosis present

## 2022-03-10 DIAGNOSIS — R6889 Other general symptoms and signs: Secondary | ICD-10-CM | POA: Diagnosis not present

## 2022-03-10 DIAGNOSIS — I2510 Atherosclerotic heart disease of native coronary artery without angina pectoris: Secondary | ICD-10-CM | POA: Diagnosis present

## 2022-03-10 DIAGNOSIS — B370 Candidal stomatitis: Secondary | ICD-10-CM | POA: Diagnosis present

## 2022-03-10 DIAGNOSIS — E441 Mild protein-calorie malnutrition: Secondary | ICD-10-CM | POA: Diagnosis present

## 2022-03-10 DIAGNOSIS — Z87891 Personal history of nicotine dependence: Secondary | ICD-10-CM

## 2022-03-10 DIAGNOSIS — Z743 Need for continuous supervision: Secondary | ICD-10-CM | POA: Diagnosis not present

## 2022-03-10 DIAGNOSIS — Y842 Radiological procedure and radiotherapy as the cause of abnormal reaction of the patient, or of later complication, without mention of misadventure at the time of the procedure: Secondary | ICD-10-CM | POA: Diagnosis present

## 2022-03-10 DIAGNOSIS — D696 Thrombocytopenia, unspecified: Secondary | ICD-10-CM | POA: Diagnosis present

## 2022-03-10 DIAGNOSIS — I5189 Other ill-defined heart diseases: Secondary | ICD-10-CM

## 2022-03-10 DIAGNOSIS — Z820 Family history of epilepsy and other diseases of the nervous system: Secondary | ICD-10-CM

## 2022-03-10 DIAGNOSIS — Z86010 Personal history of colonic polyps: Secondary | ICD-10-CM

## 2022-03-10 DIAGNOSIS — C50912 Malignant neoplasm of unspecified site of left female breast: Secondary | ICD-10-CM | POA: Diagnosis present

## 2022-03-10 DIAGNOSIS — Z853 Personal history of malignant neoplasm of breast: Secondary | ICD-10-CM

## 2022-03-10 DIAGNOSIS — K219 Gastro-esophageal reflux disease without esophagitis: Secondary | ICD-10-CM | POA: Diagnosis present

## 2022-03-10 DIAGNOSIS — K209 Esophagitis, unspecified without bleeding: Secondary | ICD-10-CM

## 2022-03-10 DIAGNOSIS — R600 Localized edema: Secondary | ICD-10-CM

## 2022-03-10 DIAGNOSIS — T699XXA Effect of reduced temperature, unspecified, initial encounter: Secondary | ICD-10-CM | POA: Diagnosis not present

## 2022-03-10 DIAGNOSIS — E782 Mixed hyperlipidemia: Secondary | ICD-10-CM | POA: Diagnosis present

## 2022-03-10 DIAGNOSIS — Z8616 Personal history of COVID-19: Secondary | ICD-10-CM

## 2022-03-10 DIAGNOSIS — R0789 Other chest pain: Secondary | ICD-10-CM | POA: Diagnosis not present

## 2022-03-10 DIAGNOSIS — M797 Fibromyalgia: Secondary | ICD-10-CM | POA: Diagnosis present

## 2022-03-10 DIAGNOSIS — Z803 Family history of malignant neoplasm of breast: Secondary | ICD-10-CM

## 2022-03-10 LAB — COMPREHENSIVE METABOLIC PANEL
ALT: 14 U/L (ref 0–44)
AST: 22 U/L (ref 15–41)
Albumin: 3.3 g/dL — ABNORMAL LOW (ref 3.5–5.0)
Alkaline Phosphatase: 96 U/L (ref 38–126)
Anion gap: 6 (ref 5–15)
BUN: 11 mg/dL (ref 8–23)
CO2: 24 mmol/L (ref 22–32)
Calcium: 9.1 mg/dL (ref 8.9–10.3)
Chloride: 103 mmol/L (ref 98–111)
Creatinine, Ser: 0.59 mg/dL (ref 0.44–1.00)
GFR, Estimated: >60 mL/min (ref >60)
Glucose, Bld: 114 mg/dL — ABNORMAL HIGH (ref 70–99)
Potassium: 4.5 mmol/L (ref 3.5–5.1)
Sodium: 133 mmol/L — ABNORMAL LOW (ref 135–145)
Total Bilirubin: 0.7 mg/dL (ref 0.3–1.2)
Total Protein: 6.3 g/dL — ABNORMAL LOW (ref 6.5–8.1)

## 2022-03-10 LAB — URINALYSIS, ROUTINE W REFLEX MICROSCOPIC
Bacteria, UA: NONE SEEN
Bilirubin Urine: NEGATIVE
Glucose, UA: NEGATIVE mg/dL
Hgb urine dipstick: NEGATIVE
Ketones, ur: 20 mg/dL — AB
Nitrite: NEGATIVE
Protein, ur: NEGATIVE mg/dL
Specific Gravity, Urine: 1.019 (ref 1.005–1.030)
pH: 6 (ref 5.0–8.0)

## 2022-03-10 LAB — CBC WITH DIFFERENTIAL/PLATELET
Abs Immature Granulocytes: 0.05 10*3/uL (ref 0.00–0.07)
Basophils Absolute: 0 10*3/uL (ref 0.0–0.1)
Basophils Relative: 1 %
Eosinophils Absolute: 0 10*3/uL (ref 0.0–0.5)
Eosinophils Relative: 1 %
HCT: 41.7 % (ref 36.0–46.0)
Hemoglobin: 12.6 g/dL (ref 12.0–15.0)
Immature Granulocytes: 2 %
Lymphocytes Relative: 5 %
Lymphs Abs: 0.1 10*3/uL — ABNORMAL LOW (ref 0.7–4.0)
MCH: 25.9 pg — ABNORMAL LOW (ref 26.0–34.0)
MCHC: 30.2 g/dL (ref 30.0–36.0)
MCV: 85.6 fL (ref 80.0–100.0)
Monocytes Absolute: 0.3 10*3/uL (ref 0.1–1.0)
Monocytes Relative: 11 %
Neutro Abs: 2.5 10*3/uL (ref 1.7–7.7)
Neutrophils Relative %: 80 %
Platelets: 123 10*3/uL — ABNORMAL LOW (ref 150–400)
RBC: 4.87 MIL/uL (ref 3.87–5.11)
RDW: 14.9 % (ref 11.5–15.5)
WBC: 3 10*3/uL — ABNORMAL LOW (ref 4.0–10.5)
nRBC: 0 % (ref 0.0–0.2)

## 2022-03-10 MED ORDER — FAMOTIDINE 20 MG PO TABS
20.0000 mg | ORAL_TABLET | Freq: Once | ORAL | Status: AC
  Administered 2022-03-10: 20 mg via ORAL
  Filled 2022-03-10: qty 1

## 2022-03-10 MED ORDER — SODIUM CHLORIDE 0.9% FLUSH
10.0000 mL | INTRAVENOUS | Status: DC | PRN
  Administered 2022-03-15 – 2022-03-16 (×2): 10 mL

## 2022-03-10 MED ORDER — LIDOCAINE VISCOUS HCL 2 % MT SOLN
15.0000 mL | OROMUCOSAL | Status: DC | PRN
  Administered 2022-03-11: 15 mL via OROMUCOSAL
  Filled 2022-03-10 (×3): qty 15

## 2022-03-10 MED ORDER — SUCRALFATE 1 GM/10ML PO SUSP
1.0000 g | Freq: Three times a day (TID) | ORAL | Status: DC
  Administered 2022-03-11 – 2022-03-16 (×16): 1 g via ORAL
  Filled 2022-03-10 (×19): qty 10

## 2022-03-10 MED ORDER — SODIUM CHLORIDE 0.9 % IV BOLUS
1000.0000 mL | Freq: Once | INTRAVENOUS | Status: AC
  Administered 2022-03-10: 1000 mL via INTRAVENOUS

## 2022-03-10 MED ORDER — PANTOPRAZOLE SODIUM 40 MG IV SOLR
40.0000 mg | Freq: Every day | INTRAVENOUS | Status: DC
  Administered 2022-03-10 – 2022-03-12 (×3): 40 mg via INTRAVENOUS
  Filled 2022-03-10 (×3): qty 10

## 2022-03-10 MED ORDER — ONDANSETRON HCL 4 MG/2ML IJ SOLN
4.0000 mg | Freq: Once | INTRAMUSCULAR | Status: AC
  Administered 2022-03-10: 4 mg via INTRAVENOUS
  Filled 2022-03-10: qty 2

## 2022-03-10 MED ORDER — CHLORHEXIDINE GLUCONATE CLOTH 2 % EX PADS
6.0000 | MEDICATED_PAD | Freq: Every day | CUTANEOUS | Status: DC
  Administered 2022-03-11 – 2022-03-16 (×7): 6 via TOPICAL

## 2022-03-10 MED ORDER — ONDANSETRON HCL 4 MG/2ML IJ SOLN
4.0000 mg | Freq: Four times a day (QID) | INTRAMUSCULAR | Status: DC | PRN
  Administered 2022-03-10 – 2022-03-15 (×4): 4 mg via INTRAVENOUS
  Filled 2022-03-10 (×4): qty 2

## 2022-03-10 MED ORDER — LACTATED RINGERS IV BOLUS
500.0000 mL | Freq: Once | INTRAVENOUS | Status: AC
  Administered 2022-03-10: 500 mL via INTRAVENOUS

## 2022-03-10 MED ORDER — ALUM & MAG HYDROXIDE-SIMETH 200-200-20 MG/5ML PO SUSP
30.0000 mL | Freq: Once | ORAL | Status: AC
  Administered 2022-03-10: 30 mL via ORAL
  Filled 2022-03-10: qty 30

## 2022-03-10 MED ORDER — ACETAMINOPHEN 650 MG RE SUPP: 650.0000 mg | Freq: Four times a day (QID) | RECTAL | Status: DC | PRN

## 2022-03-10 MED ORDER — HYDROMORPHONE HCL 1 MG/ML IJ SOLN
1.0000 mg | INTRAMUSCULAR | Status: DC | PRN
  Administered 2022-03-10 – 2022-03-14 (×16): 1 mg via INTRAVENOUS
  Filled 2022-03-10 (×16): qty 1

## 2022-03-10 MED ORDER — ENSURE ENLIVE PO LIQD
237.0000 mL | Freq: Two times a day (BID) | ORAL | Status: DC
  Administered 2022-03-13 – 2022-03-16 (×4): 237 mL via ORAL

## 2022-03-10 MED ORDER — ENOXAPARIN SODIUM 40 MG/0.4ML IJ SOSY: 40.0000 mg | PREFILLED_SYRINGE | INTRAMUSCULAR | Status: DC

## 2022-03-10 MED ORDER — ENSURE ENLIVE PO LIQD: 237.0000 mL | Freq: Two times a day (BID) | ORAL | Status: DC

## 2022-03-10 MED ORDER — HYDROMORPHONE HCL 1 MG/ML IJ SOLN
0.5000 mg | Freq: Once | INTRAMUSCULAR | Status: AC
  Administered 2022-03-10: 0.5 mg via INTRAVENOUS
  Filled 2022-03-10: qty 1

## 2022-03-10 MED ORDER — HYDROMORPHONE HCL 1 MG/ML IJ SOLN
0.5000 mg | INTRAMUSCULAR | Status: DC | PRN
  Administered 2022-03-10: 0.5 mg via INTRAVENOUS
  Filled 2022-03-10: qty 0.5

## 2022-03-10 MED ORDER — ACETAMINOPHEN 325 MG PO TABS
650.0000 mg | ORAL_TABLET | Freq: Four times a day (QID) | ORAL | Status: DC | PRN
  Administered 2022-03-12 – 2022-03-16 (×2): 650 mg via ORAL
  Filled 2022-03-10 (×2): qty 2

## 2022-03-10 NOTE — Progress Notes (Incomplete)
 Patient Care Team: Jordan, Betty G, MD as PCP - General (Family Medicine) Croitoru, Mihai, MD as PCP - Cardiology (Cardiology) Fontaine, Timothy P, MD (Inactive) as Consulting Physician (Gynecology) Mann, Jyothi, MD as Consulting Physician (Gastroenterology) Harwani, Mohan, MD as Consulting Physician (Cardiology) Gudena, Vinay, MD as Medical Oncologist (Hematology and Oncology)  DIAGNOSIS: No diagnosis found.  SUMMARY OF ONCOLOGIC HISTORY: Oncology History  Metastatic cancer to bone (HCC)  12/1998 Initial Diagnosis   Dr. Magrinat's patient originally left modified radical mastectomy and ALND: 6.2 cm mixed breast cancer 2/21 lymph nodes positive, ER/PR positive, HER2 negative, treated with Adriamycin Cytoxan x4 followed by Taxotere x4 followed by radiation and tamoxifen followed by anastrozole   03/21/2020 Miscellaneous   Noninvasive high-grade urothelial cancer with early papillary formation status post TURBT and intravesical BCG   02/16/2022 Imaging   CT chest: 4.1 cm left upper lobe lung mass, innumerable micronodules, osseous metastatic disease right eighth rib left seventh rib T10, T11, acute fracture right seventh rib.  MRI spine: Multifocal bone metastases throughout the spine and ribs sacrum and iliac bones.  MRI brain: Osseous lesions left and right parietal bones, right frontal bone, left mandible   02/18/2022 Relapse/Recurrence   Right chest wall needle core biopsy: Metastatic adenocarcinoma positive for CK7 negative for CK20, GATA3, ER, PAX8 and TTF-1, consistent with breast primary, ER 0%, PR 0%, HER2 1+, Ki-67 40%     CHIEF COMPLIANT:   INTERVAL HISTORY: [name] is a   ALLERGIES:  is allergic to hydrochlorothiazide, flomax [tamsulosin], codeine, statins, and zetia [ezetimibe].  MEDICATIONS:  No current facility-administered medications for this visit.   No current outpatient medications on file.   Facility-Administered Medications Ordered in Other  Visits  Medication Dose Route Frequency Provider Last Rate Last Admin   acetaminophen (TYLENOL) tablet 650 mg  650 mg Oral Q6H PRN Ortiz, David Manuel, MD       Or   acetaminophen (TYLENOL) suppository 650 mg  650 mg Rectal Q6H PRN Ortiz, David Manuel, MD       Chlorhexidine Gluconate Cloth 2 % PADS 6 each  6 each Topical Daily Ortiz, David Manuel, MD       [START ON 03/11/2022] feeding supplement (ENSURE ENLIVE / ENSURE PLUS) liquid 237 mL  237 mL Oral BID BM Ortiz, David Manuel, MD       HYDROmorphone (DILAUDID) injection 1 mg  1 mg Intravenous Q2H PRN Ortiz, David Manuel, MD   1 mg at 03/10/22 1909   lidocaine (XYLOCAINE) 2 % viscous mouth solution 15 mL  15 mL Mouth/Throat Q3H PRN Ortiz, David Manuel, MD       ondansetron (ZOFRAN) injection 4 mg  4 mg Intravenous Q6H PRN Ortiz, David Manuel, MD   4 mg at 03/10/22 1805   pantoprazole (PROTONIX) injection 40 mg  40 mg Intravenous Daily Ortiz, David Manuel, MD   40 mg at 03/10/22 1805   sodium chloride flush (NS) 0.9 % injection 10-40 mL  10-40 mL Intracatheter PRN Ortiz, David Manuel, MD       sucralfate (CARAFATE) 1 GM/10ML suspension 1 g  1 g Oral TID WC & HS Ortiz, David Manuel, MD        PHYSICAL EXAMINATION: ECOG PERFORMANCE STATUS: {CHL ONC ECOG PS:[id number]}  There were no vitals filed for this visit. There were no vitals filed for this visit.  BREAST:*** No palpable masses or nodules in either right or left breasts. No palpable axillary supraclavicular or infraclavicular adenopathy no breast tenderness or   nipple discharge. (exam performed in the presence of a chaperone)  LABORATORY DATA:  I have reviewed the data as listed    Latest Ref Rng & Units 03/10/2022    7:28 AM 03/04/2022    2:36 PM 02/21/2022    5:22 AM  CMP  Glucose 70 - 99 mg/dL 114  105  118   BUN 8 - 23 mg/dL _0 Creatinine 0.44 - 1.00 mg/dL 0.59  0.58  0.72   Sodium 135 - 145 mmol/L 133  132  133   Potassium 3.5 - 5.1 mmol/L 4.5  4.3  4.6    Chloride 98 - 111 mmol/L 103  97  100   CO2 22 - 32 mmol/L _1 Calcium 8.9 - 10.3 mg/dL 9.1  10.4  10.2   Total Protein 6.5 - 8.1 g/dL 6.3  6.6    Total Bilirubin 0.3 - 1.2 mg/dL 0.7  0.7    Alkaline Phos 38 - 126 U/L 96  110    AST 15 - 41 U/L 22  11    ALT 0 - 44 U/L 14  5      Lab Results  Component Value Date   WBC 3.0 (L) 03/10/2022   HGB 12.6 03/10/2022   HCT 41.7 03/10/2022   MCV 85.6 03/10/2022   PLT 123 (L) 03/10/2022   NEUTROABS 2.5 03/10/2022    ASSESSMENT & PLAN:  No problem-specific Assessment & Plan notes found for this encounter.    No orders of the defined types were placed in this encounter.  The patient has a good understanding of the overall plan. she agrees with it. she will call with any problems that may develop before the next visit here. Total time spent: 30 mins including face to face time and time spent for planning, charting and co-ordination of care   Suzzette Righter, [place] 03/10/22    I Gardiner Coins am scribing for Dr. Lindi Adie  ***

## 2022-03-10 NOTE — ED Provider Notes (Addendum)
Signed out to check pending lab/UA, that pt getting ivf, for dehydration.   It appears pt has palliative care visit arranged for tomorrow.   Family indicates not tolerating po due to dysphagia. Has been prescribed carafate but does not seem to help. Family concerned about poor po intake, dehydration, and persistent pain issues - requests admit/obs.   Dilaudid iv for pain. Additional ivf.   Maalox, pepcid po.   Hopsitalists consulted for admission.      Lajean Saver, MD 03/10/22 1217

## 2022-03-10 NOTE — ED Triage Notes (Signed)
Pt in from home via [facility] with bilateral flank pain x 2 days. Pt wincing with any movement, states she took Oxycodone just PTA, lethargic and slow response to some questions. Temp 97.9 VS en route: 124/80 82 96% RA 18

## 2022-03-10 NOTE — H&P (Signed)
History and Physical    Patient: [name] FIE:[id number] DOB: 1945-10-21 DOA: 03/10/2022 DOS: the patient was seen and examined on 03/10/2022 PCP: Martinique, Betty G, MD  Patient coming from: Home  Chief Complaint:  Chief Complaint  Patient presents with   Flank Pain   HPI: [name] is a 76 y.o. female with medical history significant of bilateral lower extremity edema, bladder cancer, CAD, diverticulosis, diverticulitis, eczema, fibromyalgia, GERD, hemorrhoids, hiatal hernia, colon polyps, history of left breast cancer with chemotherapy and radiation, gastritis, cholelithiasis, hyperlipidemia, hypertension who was brought to the emergency department due to left flank pain.  However, the patient's main complaint is dysphagia/odynophagia that has been getting progressively worse in the last few weeks despite taking analgesics.  No fever, chills or night sweats.  She has been dyspneic at times.  No rhinorrhea, wheezing or hemoptysis.  No chest pain, palpitations, diaphoresis, PND, orthopnea or pitting edema of the lower extremities.  Her appetite is decreased.  She has not been having regular bowel movements.  She has only had small amounts of semiloose stools.  No melena or hematochezia.  Positive left flank pain, but no dysuria, frequency or hematuria.  No polyuria, polydipsia, polyphagia or blurred vision.  ED course: Initial vital signs were temperature 97.9 F, pulse 62, respiration 18, BP 150/67 mmHg O2 sat 98% on room air.  The patient received 1500 mL of LR/NS bolus, 30 mL of Maalox, famotidine 20 mg IVP, hydromorphone 0.5 mg IVP and ondansetron 4 mg IVP.  Lab work: Urinalysis with ketonuria 20 and trace leukocyte esterase.  CBC showed her white count at 3.0, hemoglobin 12.6 g/dL platelets 323.  CMP with a sodium 133, glucose 114.  Total protein 6.3 and albumin 3.3 g/dL.  The rest of the CMP measurements were normal.  Imaging: Portable 1 view chest radiograph with low  volume chest miliary nodularity known from recent chest CT.  CT head without contrast with no acute or interval finding.   Review of Systems: As mentioned in the history of present illness. All other systems reviewed and are negative. Past Medical History:  Diagnosis Date   Bilateral lower extremity edema    per pt left ankle greater than right weras left leg compression hose prn   Bladder cancer [facility]) 2021   urologist-- dr Milford Cage--- first dx 2021 s/p  TURBT 's with recurrences   CAD (coronary artery disease)    cardiac cath 01/ 2005 by dr Terrence Dupont,  non-obstructive cad;  nuclear study 07-31-2016  normal with no evidence ischemia,nuclear ef 52%   Diverticulosis of colon    Eczema    Fibromyalgia    GERD (gastroesophageal reflux disease)    Hemorrhoids    Hiatal hernia    History of adenomatous polyp of colon    History of cancer chemotherapy 2000   left breast cancer   History of COVID-19 2022   mild all symptoms resolved   History of diverticulitis of colon    History of gastritis    History of kidney stones    History of left breast cancer 2000   dx 2000 and s/p left mastectomy w/ node dissection;  completed chemo and radiation therapy same year 2000 and completed anti-estrogen 2005;  per pt released from oncologist 2006 and no recurrence   Hyperlipidemia    Hypertension    followed by pcp   Nephrolithiasis    per CT 11-24-2020 bilateral tiny non-obstructive stones   Personal history of radiation therapy 2000  left breast cancer   Wears denture upper    Wears glasses    Past Surgical History:  Procedure Laterality Date   ABDOMINAL HYSTERECTOMY  1978   AND LYSIS ADHESIONS /  APPENDECTOMY  (still has ovaries)   ANTERIOR AND POSTERIOR REPAIR WITH SACROSPINOUS FIXATION  07-11-2008  @[facility]    AND ENTEROCELE REPAIR/ UTEROSACRAL COLPOSUSPENSION   BREAST REDUCTION SURGERY Right 2000   CARDIAC CATHETERIZATION  05-10-2003   dr Terrence Dupont   mild non-obstructive cad,  ef 55-60%    CATARACT EXTRACTION W/ INTRAOCULAR LENS  IMPLANT, BILATERAL  2015   COLONOSCOPY  01/2018   by dr Collene Mares   CYST EXCISION  02-28-2020  @[facility]    right axilla   CYSTOSCOPY W/ RETROGRADES Bilateral 03/21/2020   Procedure: CYSTOSCOPY WITH RETROGRADE PYELOGRAM;  Surgeon: Remi Haggard, MD;  Location[facility];  Service: Urology;  Laterality: Bilateral;   CYSTOSCOPY W/ RETROGRADES Bilateral 01/09/2021   Procedure: CYSTOSCOPY WITH RETROGRADE PYELOGRAM;  Surgeon: Remi Haggard, MD;  Location: [facility];  Service: Urology;  Laterality: Bilateral;   CYSTOSCOPY W/ RETROGRADES Bilateral 07/31/2021   Procedure: CYSTOSCOPY WITH RETROGRADE PYELOGRAM;  Surgeon: Remi Haggard, MD;  Location: [facility];  Service: Urology;  Laterality: Bilateral;   CYSTOSCOPY W/ RETROGRADES Bilateral 11/20/2021   Procedure: CYSTOSCOPY WITH RETROGRADE PYELOGRAM;  Surgeon: Remi Haggard, MD;  Location: [facility];  Service: Urology;  Laterality: Bilateral;   INGUINAL HERNIA REPAIR  09/27/2011   Procedure: HERNIA REPAIR INGUINAL ADULT;  Surgeon: Gwenyth Ober, MD;  Location: [place];  Service: General;  Laterality: Right;   IR IMAGING GUIDED PORT INSERTION  02/21/2022   KNEE ARTHROSCOPY Left 1986   MASTECTOMY WITH AXILLARY LYMPH NODE DISSECTION Left 2000   W/  RECONSTRUCTION   NASAL SINUS SURGERY  1980s   ROTATOR CUFF REPAIR Right 1985   TONSILLECTOMY     child and adenoids removed   TRANSURETHRAL RESECTION OF BLADDER TUMOR N/A 03/21/2020   Procedure: TRANSURETHRAL RESECTION OF BLADDER TUMOR (TURBT);  Surgeon: Remi Haggard, MD;  Location: [facility];  Service: Urology;  Laterality: N/A;  [place] TUMOR N/A 01/09/2021   Procedure: TRANSURETHRAL RESECTION OF BLADDER TUMOR (TURBT);  Surgeon: Remi Haggard, MD;  Location: [facility];  Service: Urology;  Laterality: N/A;    TRANSURETHRAL RESECTION OF BLADDER TUMOR WITH MITOMYCIN-C N/A 07/31/2021   Procedure: TRANSURETHRAL RESECTION OF BLADDER TUMOR WITH GEMCITABINE;  Surgeon: Remi Haggard, MD;  Location: [facility];  Service: Urology;  Laterality: N/A;   TRANSURETHRAL RESECTION OF BLADDER TUMOR WITH MITOMYCIN-C N/A 11/20/2021   Procedure: TRANSURETHRAL RESECTION OF BLADDER TUMOR WITH POSTOPERATIVE  GEMCITABINE;  Surgeon: Remi Haggard, MD;  Location: [facility];  Service: Urology;  Laterality: N/A;  30 MINS   Social History:  reports that she quit smoking about 23 years ago. Her smoking use included cigarettes. She has a 15.00 pack-year smoking history. She has never used smokeless tobacco. She reports that she does not currently use alcohol. She reports that she does not use drugs.  Allergies  Allergen Reactions   Hydrochlorothiazide Other (See Comments)    High Ca++   Flomax [Tamsulosin] Itching   Codeine Itching   Statins Other (See Comments)    Muscle aches   Zetia [Ezetimibe] Other (See Comments)    Muscle aches    Family History  Problem Relation Age of  Onset   Alzheimer's disease Mother    Breast cancer Sister 59   Colon polyps Sister    Cancer Son        prostate   Anesthesia problems Neg Hx     Prior to Admission medications   Medication Sig Start Date End Date Taking? Authorizing Provider  acetaminophen (TYLENOL) 325 MG tablet Take 650 mg by mouth every 6 (six) hours as needed for mild pain.    [provider]  BIOTIN PO Take 1 tablet by mouth daily.    [provider]  Calcium Citrate-Vitamin D (CALCIUM CITRATE + D3 PO) Take 1 tablet by mouth 2 (two) times daily.    [provider]  carboxymethylcellulose (REFRESH PLUS) 0.5 % SOLN Place 1 drop into both eyes daily as needed (dry eyes).    [provider]  cetirizine (ZYRTEC) 10 MG tablet Take 10 mg by mouth daily as needed for allergies.    [provider]   cyanocobalamin 1000 MCG tablet Take 1,000 mcg by mouth daily. Vitamin b 12    [provider]  fluconazole (DIFLUCAN) 100 MG tablet Take 1 tablet (100 mg total) by mouth daily. 03/04/22   Nicholas Lose, MD  furosemide (LASIX) 20 MG tablet Take 1 tablet (20 mg total) by mouth daily. 01/28/22   Martinique, Betty G, MD  ibuprofen (ADVIL) 200 MG tablet Take 800 mg by mouth every 6 (six) hours as needed for mild pain.    [provider]  losartan (COZAAR) 100 MG tablet Take 1 tablet (100 mg total) by mouth daily. 02/08/22   Martin[place], Betty G, MD  meclizine (ANTIVERT) 25 MG tablet TAKE 1 TABLET BY MOUTH 3  TIMES DAILY AS NEEDED FOR  DIZZINESS Patient taking differently: Take 25 mg by mouth 3 (three) times daily as needed for dizziness. 10/15/21   [place], Betty G, MD  Menthol, Topical Analgesic, (BIOFREEZE ROLL-ON EX) Apply 1 Application topically 2 (two) times daily as needed (pain).    [provider]  ondansetron (ZOFRAN) 8 MG tablet Take 1 tablet (8 mg total) by mouth every 8 (eight) hours as needed for nausea or vomiting. 02/26/22   Nicholas Lose, MD  oxyCODONE (ROXICODONE) 15 MG immediate release tablet Take 1 tablet (15 mg total) by mouth every 6 (six) hours as needed. 02/26/22   Nicholas Lose, MD  oxyCODONE 30 MG 12 hr tablet Take 1 tablet (30 mg total) by mouth every 12 (twelve) hours. 03/04/22   Nicholas Lose, MD  pantoprazole (PROTONIX) 40 MG tablet Take 1 tablet (40 mg total) by mouth daily. 03/06/22   Martinique, Betty G, MD  polyethylene glycol (MIRALAX / GLYCOLAX) 17 g packet Take 17 g by mouth daily as needed for mild constipation.    [provider]  potassium chloride SA (KLOR-CON M) 20 MEQ tablet TAKE 1 TABLET BY MOUTH  DAILY Patient taking differently: 20 mEq at bedtime. TAKE 1 TABLET BY MOUTH  DAILY 04/04/21   Martin[place], Betty G, MD  Probiotic Product (ALIGN PO) Take 1 tablet by mouth daily.     [provider]  rosuvastatin (CRESTOR) 10 MG tablet 2 to 3 x  week in am Patient taking differently: Take 10 mg by mouth 2 (two) times a week. 2 to 3 x week in am 10/09/21   Martin[place], Betty G, MD  sucralfate (CARAFATE) 1 g tablet Take 1 tablet (1 g total) by mouth 4 (four) times daily -  with meals and at bedtime. 5 min  before meals for radiation induced esophagitis 03/01/22   Tyler Pita, MD  UNABLE TO FIND Flucinonide usp 0.05 % cream prn for eczema    [provider]    Physical Exam: Vitals:   03/10/22 1000 03/10/22 1100 03/10/22 1141 03/10/22 1200  BP: (!) 130/55 128/63 137/74 (!) 141/67  Pulse: 96  96 (!) 102  Resp: 18 17 19 15   Temp:  98.4 F (36.9 C)    TempSrc:  Oral    SpO2: 99% 99% 100% (!) 89%  Weight:      Height:       Physical Exam Vitals and nursing note reviewed.  Constitutional:      General: She is awake. She is not in acute distress.    Appearance: She is overweight. She is ill-appearing.     Comments: Chronically ill-appearing.  HENT:     Head: Normocephalic.     Nose: No rhinorrhea.     Mouth/Throat:     Mouth: Mucous membranes are dry.  Eyes:     General: No scleral icterus.    Pupils: Pupils are equal, round, and reactive to light.  Neck:     Vascular: No JVD.  Cardiovascular:     Rate and Rhythm: Normal rate and regular rhythm.     Heart sounds: S1 normal and S2 normal.  Pulmonary:     Effort: Pulmonary effort is normal.     Breath sounds: Normal breath sounds. No wheezing, rhonchi or rales.  Abdominal:     General: Abdomen is protuberant. Bowel sounds are normal. There is no distension.     Palpations: Abdomen is soft.     Tenderness: There is abdominal tenderness in the left upper quadrant and left lower quadrant. There is no guarding or rebound. Negative signs include Murphy's sign and McBurney's sign.  Musculoskeletal:     Cervical back: Neck supple.     Right lower leg: No edema.     Left lower leg: No edema.  Skin:    General: Skin is warm and dry.  Neurological:     General: No focal  deficit present.     Mental Status: She is alert and oriented to person, place, and time.  Psychiatric:        Behavior: Behavior normal. Behavior is cooperative.   Data Reviewed:  Results are pending, will review when available.  Assessment and Plan: Principal Problem:   Odynophagia/dysphagia Likely due to radiation. Observation/telemetry. Viscous lidocaine as needed. Analgesics as needed parenterally. Pantoprazole 40 mg IVP. Consult GI in AM.  Active Problems:   Mixed hyperlipidemia Hold rosuvastatin for now.    HTN (hypertension) Blood pressures have been soft. Hold antihypertensives/diuretic.. Monitor blood pressure.    GERD (gastroesophageal reflux disease) Pantoprazole IV.    CAD (coronary artery disease) Denies anginal pain. Hold statin, ARB and aspirin.    Diastolic dysfunction without heart failure No signs of decompensation. Patient is volume depleted. Holding ARB and diuretic.    Prediabetes Monitor CBG.    Malignant neoplasm of left female breast [facility]) With:   Metastatic cancer to bone (HCC) Analgesics as needed.    Leukopenia In the setting of cancer/chemo/radiotherapy. Monitor white blood cell count.    Thrombocytopenia (HCC) Monitor platelet count.    Mild protein malnutrition (HCC) Liquid protein supplementation.    Advance Care Planning:   Code Status: Full Code   Consults:   Family Communication:   Severity of Illness: The appropriate patient status for this patient is OBSERVATION. Observation status  is judged to be reasonable and necessary in order to provide the required intensity of service to ensure the patient's safety. The patient's presenting symptoms, physical exam findings, and initial radiographic and laboratory data in the context of their medical condition is felt to place them at decreased risk for further clinical deterioration. Furthermore, it is anticipated that the patient will be medically stable for discharge from  the hospital within 2 midnights of admission.   Author: Reubin Milan, MD 03/10/2022 2:10 PM  For on call review [email or web address].   This document was prepared using Dragon voice recognition software and may contain some unintended transcription errors.

## 2022-03-10 NOTE — ED Provider Notes (Signed)
[facility] DEPT Provider Note   CSN: [id number] Arrival date & time: 03/10/22  0414     History  Chief Complaint  Patient presents with   Flank Pain    [name] is a 76 y.o. female.  The history is provided by the patient and medical records.  Flank Pain  [name] is a 76 y.o. female who presents to the Emergency Department complaining of pain.  She presents to the emergency department for evaluation of pain all over for the last 2 weeks.  She was diagnosed on October 28 with metastatic breast cancer.  Since hospital discharge she has been taking OxyContin twice daily as well as oxycodone as needed for breakthrough pain.  She is awaiting additional information from oncology regarding palliative chemotherapy options.  When she was hospitalized she did receive palliative radiation.  Family reports that she has been sleeping significantly more than baseline and is walking less.  This evening she had an episode of confusion where she was staring into space.  She is not eating or drinking much and has been complaining of sore throat.  She was recently diagnosed with thrush.  No associated fever but she is having nausea.      Home Medications Prior to Admission medications   Medication Sig Start Date End Date Taking? Authorizing Provider  acetaminophen (TYLENOL) 325 MG tablet Take 650 mg by mouth every 6 (six) hours as needed for mild pain.    [provider]  BIOTIN PO Take 1 tablet by mouth daily.    [provider]  Calcium Citrate-Vitamin D (CALCIUM CITRATE + D3 PO) Take 1 tablet by mouth 2 (two) times daily.    [provider]  carboxymethylcellulose (REFRESH PLUS) 0.5 % SOLN Place 1 drop into both eyes daily as needed (dry eyes).    [provider]  cetirizine (ZYRTEC) 10 MG tablet Take 10 mg by mouth daily as needed for allergies.    [provider]  cyanocobalamin 1000 MCG tablet Take  1,000 mcg by mouth daily. Vitamin b 12    [provider]  fluconazole (DIFLUCAN) 100 MG tablet Take 1 tablet (100 mg total) by mouth daily. 03/04/22   Nicholas Lose, MD  furosemide (LASIX) 20 MG tablet Take 1 tablet (20 mg total) by mouth daily. 01/28/22   Martinique, Betty G, MD  ibuprofen (ADVIL) 200 MG tablet Take 800 mg by mouth every 6 (six) hours as needed for mild pain.    [provider]  losartan (COZAAR) 100 MG tablet Take 1 tablet (100 mg total) by mouth daily. 02/08/22   Martinique, Betty G, MD  meclizine (ANTIVERT) 25 MG tablet TAKE 1 TABLET BY MOUTH 3  TIMES DAILY AS NEEDED FOR  DIZZINESS Patient taking differently: Take 25 mg by mouth 3 (three) times daily as needed for dizziness. 10/15/21   Martinique, Betty G, MD  Menthol, Topical Analgesic, (BIOFREEZE ROLL-ON EX) Apply 1 Application topically 2 (two) times daily as needed (pain).    [provider]  ondansetron (ZOFRAN) 8 MG tablet Take 1 tablet (8 mg total) by mouth every 8 (eight) hours as needed for nausea or vomiting. 02/26/22   Nicholas Lose, MD  oxyCODONE (ROXICODONE) 15 MG immediate release tablet Take 1 tablet (15 mg total) by mouth every 6 (six) hours as needed. 02/26/22   Nicholas Lose, MD  oxyCODONE 30 MG 12 hr tablet Take 1 tablet (30 mg total) by mouth every 12 (twelve) hours. 03/04/22  Nicholas Lose, MD  pantoprazole (PROTONIX) 40 MG tablet Take 1 tablet (40 mg total) by mouth daily. 03/06/22   Martinique, Betty G, MD  polyethylene glycol (MIRALAX / GLYCOLAX) 17 g packet Take 17 g by mouth daily as needed for mild constipation.    [provider]  potassium chloride SA (KLOR-CON M) 20 MEQ tablet TAKE 1 TABLET BY MOUTH  DAILY Patient taking differently: 20 mEq at bedtime. TAKE 1 TABLET BY MOUTH  DAILY 04/04/21   Martinique, Betty G, MD  Probiotic Product (ALIGN PO) Take 1 tablet by mouth daily.     [provider]  rosuvastatin (CRESTOR) 10 MG tablet 2 to 3 x week in am Patient taking  differently: Take 10 mg by mouth 2 (two) times a week. 2 to 3 x week in am 10/09/21   Martinique, Betty G, MD  sucralfate (CARAFATE) 1 g tablet Take 1 tablet (1 g total) by mouth 4 (four) times daily -  with meals and at bedtime. 5 min before meals for radiation induced esophagitis 03/01/22   Tyler Pita, MD  UNABLE TO FIND Flucinonide usp 0.05 % cream prn for eczema    [provider]      Allergies    Hydrochlorothiazide, Flomax [tamsulosin], Codeine, Statins, and Zetia [ezetimibe]    Review of Systems   Review of Systems  Genitourinary:  Positive for flank pain.  All other systems reviewed and are negative.   Physical Exam Updated Vital Signs BP 132/72   Pulse (!) 116   Temp 97.9 F (36.6 C)   Resp 18   Ht 5\' 8"  (1.727 m)   Wt 84.8 kg   SpO2 97%   BMI 28.43 kg/m  Physical Exam Vitals and nursing note reviewed.  Constitutional:      Appearance: She is well-developed. She is ill-appearing.     Comments: drowsy  HENT:     Head: Normocephalic and atraumatic.  Cardiovascular:     Rate and Rhythm: Regular rhythm. Tachycardia present.     Heart sounds: No murmur heard. Pulmonary:     Effort: Pulmonary effort is normal. No respiratory distress.     Breath sounds: Normal breath sounds.  Abdominal:     Palpations: Abdomen is soft.     Tenderness: There is no abdominal tenderness. There is no guarding or rebound.  Musculoskeletal:        General: No tenderness.  Skin:    General: Skin is warm and dry.  Neurological:     Comments: Drowsy, global weakness  Psychiatric:        Behavior: Behavior normal.     ED Results / Procedures / Treatments   Labs (all labs ordered are listed, but only abnormal results are displayed) Labs Reviewed - No data to display  EKG None  Radiology No results found.  Procedures Procedures    Medications Ordered in ED Medications - No data to display  ED Course/ Medical Decision Making/ A&P                            Medical Decision Making Amount and/or Complexity of Data Reviewed Labs: ordered. Radiology: ordered.   Patient with recent diagnosis of metastatic breast cancer with multiple bony mets here for evaluation of pain all over.  She is dehydrated and ill-appearing on examination.  Concern for metabolic derangement given her symptoms.  We will start IV fluids and check labs and CT head.  Patient care  transferred pending additional studies.        Final Clinical Impression(s) / ED Diagnoses Final diagnoses:  None    Rx / DC Orders ED Discharge Orders     None         Quintella Reichert, MD 03/10/22 [phone]

## 2022-03-10 NOTE — Discharge Instructions (Addendum)
It was our pleasure to provide your ER care today - we hope that you feel better.  Drink plenty of fluids/stay well hydrated.   Take your pain medication as prescribed, as need.   Follow up closely with your doctor this coming week. Also follow up with palliative care provider tomorrow as planned.   We have made a home health referral for additional home services - they should be contacting you.  Return to ER if worse, new symptoms, fevers, severe/intractable pain, persistent vomiting, trouble breathing, or other emergency concern.

## 2022-03-11 ENCOUNTER — Inpatient Hospital Stay: Payer: Medicare Other | Admitting: Hematology and Oncology

## 2022-03-11 ENCOUNTER — Encounter: Payer: Self-pay | Admitting: Hematology and Oncology

## 2022-03-11 ENCOUNTER — Telehealth: Payer: Self-pay | Admitting: Family Medicine

## 2022-03-11 DIAGNOSIS — I5189 Other ill-defined heart diseases: Secondary | ICD-10-CM | POA: Diagnosis not present

## 2022-03-11 DIAGNOSIS — D696 Thrombocytopenia, unspecified: Secondary | ICD-10-CM | POA: Diagnosis not present

## 2022-03-11 DIAGNOSIS — C7951 Secondary malignant neoplasm of bone: Secondary | ICD-10-CM | POA: Diagnosis not present

## 2022-03-11 DIAGNOSIS — D72819 Decreased white blood cell count, unspecified: Secondary | ICD-10-CM | POA: Diagnosis present

## 2022-03-11 DIAGNOSIS — R4701 Aphasia: Secondary | ICD-10-CM | POA: Diagnosis present

## 2022-03-11 DIAGNOSIS — I110 Hypertensive heart disease with heart failure: Secondary | ICD-10-CM | POA: Diagnosis present

## 2022-03-11 DIAGNOSIS — Z9221 Personal history of antineoplastic chemotherapy: Secondary | ICD-10-CM | POA: Diagnosis not present

## 2022-03-11 DIAGNOSIS — E860 Dehydration: Secondary | ICD-10-CM | POA: Diagnosis not present

## 2022-03-11 DIAGNOSIS — I2510 Atherosclerotic heart disease of native coronary artery without angina pectoris: Secondary | ICD-10-CM | POA: Diagnosis not present

## 2022-03-11 DIAGNOSIS — Z8616 Personal history of COVID-19: Secondary | ICD-10-CM | POA: Diagnosis not present

## 2022-03-11 DIAGNOSIS — R531 Weakness: Secondary | ICD-10-CM | POA: Diagnosis not present

## 2022-03-11 DIAGNOSIS — M797 Fibromyalgia: Secondary | ICD-10-CM | POA: Diagnosis present

## 2022-03-11 DIAGNOSIS — R627 Adult failure to thrive: Secondary | ICD-10-CM | POA: Diagnosis not present

## 2022-03-11 DIAGNOSIS — E876 Hypokalemia: Secondary | ICD-10-CM | POA: Diagnosis not present

## 2022-03-11 DIAGNOSIS — Z923 Personal history of irradiation: Secondary | ICD-10-CM | POA: Diagnosis not present

## 2022-03-11 DIAGNOSIS — B370 Candidal stomatitis: Secondary | ICD-10-CM | POA: Diagnosis present

## 2022-03-11 DIAGNOSIS — T66XXXA Radiation sickness, unspecified, initial encounter: Secondary | ICD-10-CM | POA: Diagnosis not present

## 2022-03-11 DIAGNOSIS — Y842 Radiological procedure and radiotherapy as the cause of abnormal reaction of the patient, or of later complication, without mention of misadventure at the time of the procedure: Secondary | ICD-10-CM | POA: Diagnosis present

## 2022-03-11 DIAGNOSIS — Z79899 Other long term (current) drug therapy: Secondary | ICD-10-CM | POA: Diagnosis not present

## 2022-03-11 DIAGNOSIS — I10 Essential (primary) hypertension: Secondary | ICD-10-CM | POA: Diagnosis not present

## 2022-03-11 DIAGNOSIS — K209 Esophagitis, unspecified without bleeding: Secondary | ICD-10-CM | POA: Diagnosis not present

## 2022-03-11 DIAGNOSIS — E782 Mixed hyperlipidemia: Secondary | ICD-10-CM | POA: Diagnosis present

## 2022-03-11 DIAGNOSIS — K208 Other esophagitis without bleeding: Secondary | ICD-10-CM | POA: Diagnosis not present

## 2022-03-11 DIAGNOSIS — K219 Gastro-esophageal reflux disease without esophagitis: Secondary | ICD-10-CM | POA: Diagnosis not present

## 2022-03-11 DIAGNOSIS — Z87891 Personal history of nicotine dependence: Secondary | ICD-10-CM | POA: Diagnosis not present

## 2022-03-11 DIAGNOSIS — I5032 Chronic diastolic (congestive) heart failure: Secondary | ICD-10-CM | POA: Diagnosis present

## 2022-03-11 DIAGNOSIS — Z9012 Acquired absence of left breast and nipple: Secondary | ICD-10-CM | POA: Diagnosis not present

## 2022-03-11 DIAGNOSIS — Z853 Personal history of malignant neoplasm of breast: Secondary | ICD-10-CM | POA: Diagnosis not present

## 2022-03-11 DIAGNOSIS — R1310 Dysphagia, unspecified: Secondary | ICD-10-CM | POA: Diagnosis not present

## 2022-03-11 DIAGNOSIS — E441 Mild protein-calorie malnutrition: Secondary | ICD-10-CM | POA: Diagnosis present

## 2022-03-11 LAB — COMPREHENSIVE METABOLIC PANEL
ALT: 19 U/L (ref 0–44)
AST: 27 U/L (ref 15–41)
Albumin: 2.9 g/dL — ABNORMAL LOW (ref 3.5–5.0)
Alkaline Phosphatase: 83 U/L (ref 38–126)
Anion gap: 6 (ref 5–15)
BUN: 8 mg/dL (ref 8–23)
CO2: 25 mmol/L (ref 22–32)
Calcium: 8.7 mg/dL — ABNORMAL LOW (ref 8.9–10.3)
Chloride: 105 mmol/L (ref 98–111)
Creatinine, Ser: 0.45 mg/dL (ref 0.44–1.00)
GFR, Estimated: >60 mL/min (ref >60)
Glucose, Bld: 94 mg/dL (ref 70–99)
Potassium: 4.4 mmol/L (ref 3.5–5.1)
Sodium: 136 mmol/L (ref 135–145)
Total Bilirubin: 0.6 mg/dL (ref 0.3–1.2)
Total Protein: 5.6 g/dL — ABNORMAL LOW (ref 6.5–8.1)

## 2022-03-11 LAB — CBC
HCT: 37.9 % (ref 36.0–46.0)
Hemoglobin: 11.7 g/dL — ABNORMAL LOW (ref 12.0–15.0)
MCH: 26.4 pg (ref 26.0–34.0)
MCHC: 30.9 g/dL (ref 30.0–36.0)
MCV: 85.6 fL (ref 80.0–100.0)
Platelets: 102 10*3/uL — ABNORMAL LOW (ref 150–400)
RBC: 4.43 MIL/uL (ref 3.87–5.11)
RDW: 14.8 % (ref 11.5–15.5)
WBC: 2.5 10*3/uL — ABNORMAL LOW (ref 4.0–10.5)
nRBC: 0 % (ref 0.0–0.2)

## 2022-03-11 MED ORDER — MAGIC MOUTHWASH W/LIDOCAINE
10.0000 mL | Freq: Four times a day (QID) | ORAL | Status: AC
  Administered 2022-03-11 – 2022-03-14 (×10): 10 mL via ORAL
  Filled 2022-03-11 (×12): qty 10

## 2022-03-11 MED ORDER — SODIUM CHLORIDE 0.9 % IV SOLN: INTRAVENOUS | Status: DC

## 2022-03-11 MED ORDER — PROCHLORPERAZINE EDISYLATE 10 MG/2ML IJ SOLN
5.0000 mg | Freq: Once | INTRAMUSCULAR | Status: AC | PRN
  Administered 2022-03-11: 5 mg via INTRAVENOUS
  Filled 2022-03-11: qty 2

## 2022-03-11 NOTE — Consult Note (Signed)
Reason for Consult: Odynophagia and dysphagia Referring Physician: McBain HPI: This is a 76 year old female with metastatic breast cancer, CAD, GERD, and multiple medical problems admitted for complaints of odynophagia and dysphagia.  Her symptoms started shortly after she started XRT for her bone mets from the breast cancer.  Prior to this time she was able to swallow without any discomfort.  Last week the Oncology office visit was positive for oral thrush and she was started on Diflucan, but this did not improve her pain.  She was still suffering with odynophagia.  The patient denies any prior issues with these symptoms before.  Past Medical History:  Diagnosis Date   Bilateral lower extremity edema    per pt left ankle greater than right weras left leg compression hose prn   Bladder cancer [facility]) 2021   urologist-- dr Milford Cage--- first dx 2021 s/p  TURBT 's with recurrences   CAD (coronary artery disease)    cardiac cath 01/ 2005 by dr Terrence Dupont,  non-obstructive cad;  nuclear study 07-31-2016  normal with no evidence ischemia,nuclear ef 52%   Diverticulosis of colon    Eczema    Fibromyalgia    GERD (gastroesophageal reflux disease)    Hemorrhoids    Hiatal hernia    History of adenomatous polyp of colon    History of cancer chemotherapy 2000   left breast cancer   History of COVID-19 2022   mild all symptoms resolved   History of diverticulitis of colon    History of gastritis    History of kidney stones    History of left breast cancer 2000   dx 2000 and s/p left mastectomy w/ node dissection;  completed chemo and radiation therapy same year 2000 and completed anti-estrogen 2005;  per pt released from oncologist 2006 and no recurrence   Hyperlipidemia    Hypertension    followed by pcp   Nephrolithiasis    per CT 11-24-2020 bilateral tiny non-obstructive stones   Personal history of radiation therapy 2000   left breast cancer   Wears denture upper     Wears glasses     Past Surgical History:  Procedure Laterality Date   ABDOMINAL HYSTERECTOMY  1978   AND LYSIS ADHESIONS /  APPENDECTOMY  (still has ovaries)   ANTERIOR AND POSTERIOR REPAIR WITH SACROSPINOUS FIXATION  07-11-2008  @[facility]    AND ENTEROCELE REPAIR/ UTEROSACRAL COLPOSUSPENSION   BREAST REDUCTION SURGERY Right 2000   CARDIAC CATHETERIZATION  05-10-2003   dr Terrence Dupont   mild non-obstructive cad,  ef 55-60%   CATARACT EXTRACTION W/ INTRAOCULAR LENS  IMPLANT, BILATERAL  2015   COLONOSCOPY  01/2018   by dr Collene Mares   CYST EXCISION  02-28-2020  @[facility]    right axilla   CYSTOSCOPY W/ RETROGRADES Bilateral 03/21/2020   Procedure: CYSTOSCOPY WITH RETROGRADE PYELOGRAM;  Surgeon: Remi Haggard, MD;  Location: [facility];  Service: Urology;  Laterality: Bilateral;   CYSTOSCOPY W/ RETROGRADES Bilateral 01/09/2021   Procedure: CYSTOSCOPY WITH RETROGRADE PYELOGRAM;  Surgeon: Remi Haggard, MD;  Location: [facility];  Service: Urology;  Laterality: Bilateral;   CYSTOSCOPY W/ RETROGRADES Bilateral 07/31/2021   Procedure: CYSTOSCOPY WITH RETROGRADE PYELOGRAM;  Surgeon: Remi Haggard, MD;  Location: [facility];  Service: Urology;  Laterality: Bilateral;   CYSTOSCOPY W/ RETROGRADES Bilateral 11/20/2021   Procedure: CYSTOSCOPY WITH RETROGRADE PYELOGRAM;  Surgeon: Remi Haggard, MD;  Location: [facility];  Service:  Urology;  Laterality: Bilateral;   INGUINAL HERNIA REPAIR  09/27/2011   Procedure: HERNIA REPAIR INGUINAL ADULT;  Surgeon: Gwenyth Ober, MD;  Location: [place];  Service: General;  Laterality: Right;   IR IMAGING GUIDED PORT INSERTION  02/21/2022   KNEE ARTHROSCOPY Left 1986   MASTECTOMY WITH AXILLARY LYMPH NODE DISSECTION Left 2000   W/  RECONSTRUCTION   NASAL SINUS SURGERY  1980s   ROTATOR CUFF REPAIR Right 1985   TONSILLECTOMY     child and adenoids removed   TRANSURETHRAL RESECTION OF BLADDER TUMOR N/A  03/21/2020   Procedure: TRANSURETHRAL RESECTION OF BLADDER TUMOR (TURBT);  Surgeon: Remi Haggard, MD;  Location: [facility];  Service: Urology;  Laterality: N/A;  [place] TUMOR N/A 01/09/2021   Procedure: TRANSURETHRAL RESECTION OF BLADDER TUMOR (TURBT);  Surgeon: Remi Haggard, MD;  Location[facility];  Service: Urology;  Laterality: N/A;   TRANSURETHRAL RESECTION OF BLADDER TUMOR WITH MITOMYCIN-C N/A 07/31/2021   Procedure: TRANSURETHRAL RESECTION OF BLADDER TUMOR WITH GEMCITABINE;  Surgeon: Remi Haggard, MD;  Location: [facility];  Service: Urology;  Laterality: N/A;   TRANSURETHRAL RESECTION OF BLADDER TUMOR WITH MITOMYCIN-C N/A 11/20/2021   Procedure: TRANSURETHRAL RESECTION OF BLADDER TUMOR WITH POSTOPERATIVE  GEMCITABINE;  Surgeon: Remi Haggard, MD;  Location: [facility];  Service: Urology;  Laterality: N/A;  50 MINS    Family History  Problem Relation Age of Onset   Alzheimer's disease Mother    Breast cancer Sister 77   Colon polyps Sister    Cancer Son        prostate   Anesthesia problems Neg Hx     Social History:  reports that she quit smoking about 23 years ago. Her smoking use included cigarettes. She has a 15.00 pack-year smoking history. She has never used smokeless tobacco. She reports that she does not currently use alcohol. She reports that she does not use drugs.  Allergies:  Allergies  Allergen Reactions   Hydrochlorothiazide Other (See Comments)    High Ca++   Flomax [Tamsulosin] Itching   Codeine Itching   Statins Other (See Comments)    Muscle aches   Zetia [Ezetimibe] Other (See Comments)    Muscle aches    Medications: Scheduled:  Chlorhexidine Gluconate Cloth  6 each Topical Daily   feeding supplement  237 mL Oral BID BM   magic mouthwash w/lidocaine  10 mL Oral QID   pantoprazole (PROTONIX) IV  40 mg Intravenous Daily   sucralfate   1 g Oral TID WC & HS   Continuous:  sodium chloride 75 mL/hr at 03/11/22 1502    Results for orders placed or performed during the hospital encounter of 03/10/22 (from the past 24 hour(s))  Comprehensive metabolic panel     Status: Abnormal   Collection Time: 03/11/22  2:54 AM  Result Value Ref Range   Sodium 136 135 - 145 mmol/L   Potassium 4.4 3.5 - 5.1 mmol/L   Chloride 105 98 - 111 mmol/L   CO2 25 22 - 32 mmol/L   Glucose, Bld 94 70 - 99 mg/dL   BUN 8 8 - 23 mg/dL   Creatinine, Ser 0.45 0.44 - 1.00 mg/dL   Calcium 8.7 (L) 8.9 - 10.3 mg/dL   Total Protein 5.6 (L) 6.5 - 8.1 g/dL   Albumin 2.9 (L) 3.5 - 5.0 g/dL   AST 27 15 - 41 U/L  ALT 19 0 - 44 U/L   Alkaline Phosphatase 83 38 - 126 U/L   Total Bilirubin 0.6 0.3 - 1.2 mg/dL   GFR, Estimated >60 >60 mL/min   Anion gap 6 5 - 15  CBC     Status: Abnormal   Collection Time: 03/11/22  2:54 AM  Result Value Ref Range   WBC 2.5 (L) 4.0 - 10.5 K/uL   RBC 4.43 3.87 - 5.11 MIL/uL   Hemoglobin 11.7 (L) 12.0 - 15.0 g/dL   HCT 37.9 36.0 - 46.0 %   MCV 85.6 80.0 - 100.0 fL   MCH 26.4 26.0 - 34.0 pg   MCHC 30.9 30.0 - 36.0 g/dL   RDW 14.8 11.5 - 15.5 %   Platelets 102 (L) 150 - 400 K/uL   nRBC 0.0 0.0 - 0.2 %     CT Head Wo Contrast  Result Date: 03/10/2022 CLINICAL DATA:  Mental status change with unknown cause EXAM: CT HEAD WITHOUT CONTRAST TECHNIQUE: Contiguous axial images were obtained from the base of the skull through the vertex without intravenous contrast. RADIATION DOSE REDUCTION: This exam was performed according to the departmental dose-optimization program which includes automated exposure control, adjustment of the mA and/or kV according to patient size and/or use of iterative reconstruction technique. COMPARISON:  02/17/2022 brain MRI FINDINGS: Brain: Ovoid high-density at the posterior left thalamus is nonacute by MRI stability since 2020, stealthy on most sequences possibly a cavernoma. Asymmetric calcification at  the left globus pallidus. No acute infarct, hydrocephalus, collection, or mass. Generalized cerebral volume loss. Vascular: No hyperdense vessel or unexpected calcification. Skull: Normal. Negative for fracture or focal lesion. Sinuses/Orbits: No acute finding. IMPRESSION: No acute or interval finding Electronically Signed   By: Jorje Guild M.D.   On: 03/10/2022 08:17   DG Chest Port 1 View  Result Date: 03/10/2022 CLINICAL DATA:  Altered mental status EXAM: PORTABLE CHEST 1 VIEW COMPARISON:  02/16/2022 FINDINGS: Low volume chest with diffuse interstitial coarsening that has a reticulonodular appearance. Generalized nodularity by recent chest CT. There is a left paramediastinal mass which is occult by this frontal view. Right porta catheter with tip at the upper right atrium. Postoperative left axilla. IMPRESSION: 1. Low volume chest. 2. Miliary nodularity known from recent chest CT. Electronically Signed   By: Jorje Guild M.D.   On: 03/10/2022 06:53    ROS:  As stated above in the HPI otherwise negative.  Blood pressure (!) 127/55, pulse (!) 103, temperature 98.1 F (36.7 C), temperature source Oral, resp. rate (!) 22, height 5\' 8"  (1.727 m), weight 84.8 kg, SpO2 100 %.    PE: Gen: NAD, Alert and Oriented HEENT:  [place]/AT, EOMI Neck: Supple, no LAD Lungs: CTA Bilaterally CV: RRR without M/G/R ABD: Soft, NTND, +BS Ext: No C/C/E  Assessment/Plan: 1) Odynophagia. 2) Dysphagia. 3) Metastatic breast cancer.   Her symptoms started shortly after starting XRT.  There is a concern that she may have radiation induced injury to her esophagus or even an opportunistic infection such as CMV or HSV.  Plan: 1) EGD tomorrow. Jodine Muchmore D 03/11/2022, 5:41 PM

## 2022-03-11 NOTE — Assessment & Plan Note (Deleted)
Metastatic breast cancer with lung and bone metastases Right chest wall mass biopsy: Metastatic adenocarcinoma consistent with breast primary, triple negative, Ki-67 40%, HER2 1+   Recommendation: [facility] molecular testing to determine if she would benefit from immunotherapy. If PD-L1 is negative then we will consider treatment with Xeloda or Taxol or Trodelvy. If PD-L1 is positive then we will treat her with [facility] immunotherapy. Bone metastases: She will start Xgeva today.   Pain control: Currently on OxyContin and oxycodone. Currently pain is at 10/10 Will increase dose of Oxycontin to 30 mg bid They had difficulty getting it authorized.  I will send a prescription to [facility].   Thrush: Causing severe throat pain: sent for Diflucan. Nausea and vomiting: Patient has nausea medication. Currently finishing radiation tomorrow. Palliative care has been consulted.   Return to clinic after the [facility] molecular testing results are available. We requested an expedited analysis.

## 2022-03-11 NOTE — Progress Notes (Signed)
PROGRESS NOTE  [name]  DOB: 03/21/1946  PCP: Martinique, Betty G, MD ZCH:[id number]  DOA: 03/10/2022  LOS: 0 days  Hospital Day: 2  Brief narrative: [name] is a 76 y.o. female with PMH significant for HTN, HLD, CAD, bilateral lower extremity lymphedema, bladder cancer, diverticulosis, GERD, fibromyalgia, left breast cancer status post chemo/radiation 11/19, patient was brought to the ED with complaint of progressively worsening dysphagia/odynophagia in the last few weeks causing poor oral intake, hydration and weakness.  In the ED, patient was afebrile, heart rate in 60s, blood pressure 150/67, breathing on room air. Labs mostly unremarkable, hemoglobin 12.6. Urinalysis with clear yellow urine with trace leukocytes CT head unremarkable Chest x-ray with low volume chest miliary nodularity known from recent chest CT.   Admitted to [facility]  Subjective: Patient was seen and examined this morning.  Pleasant elderly African-American female.  Lying down on bed.  Multiple family members at bedside. It seems patient has not been able to tolerate oral intake or hydration for last several days. Chart reviewed. Overnight heart rate in 90s, blood pressure 120s, breathing on room air.  Assessment and plan: Odynophagia/dysphagia With history of radiation for left breast cancer.  Recently completed radiation. Presented with progressively worsening odynophagia, dysphagia, poor oral intake/hydration leading to lethargy. It seems he was tried on a course of fluconazole, biotin without success. Currently on IV Protonix. I will also order for scheduled Magic mouthwash with lidocaine 4 times daily.  Continue viscous lidocaine as needed  GI consulted.  Chronic diastolic dysfunction Essential hypertension Volume depleted on exam. PTA on losartan 100 mg daily, Lasix 20 mg daily.  Blood pressure running in normal range without meds.  Continue to monitor. Start normal saline at 50 mill  per hour while having compromised oral intake.  Prediabetes Blood sugar level remains controlled  CAD  hyperlipidemia Hold aspirin pending EGD. Continue Crestor   GERD  Pantoprazole IV   Left breast cancer with bone mets History of chemo and radiation Continue as needed pain meds  Leukopenia In the setting of cancer/chemo/radiotherapy. Monitor white blood cell count. Recent Labs  Lab 03/04/22 1436 03/10/22 0728 03/11/22 0254  WBC 6.8 3.0* 2.5*   Thrombocytopenia  Platelet low and stable. Recent Labs  Lab 03/04/22 1436 03/10/22 0728 03/11/22 0254  PLT 188 123* 102*    Mild protein malnutrition  Liquid protein supplementation.  Goals of care   Code Status: Full Code    Mobility: PT eval ordered  Skin assessment:     Nutritional status:  Body mass index is 28.43 kg/m.          Diet:  Diet Order             Diet clear liquid Room service appropriate? Yes; Fluid consistency: Thin  Diet effective now                   DVT prophylaxis:  SCDs Start: 03/10/22 1753   Antimicrobials: None Fluid: NS at 46 mill per hour Consultants: GI Family Communication: Multiple family members at bedside  Status is: Observation  Continue in-hospital care because: Significant odynophagia/dysphagia Level of care: Telemetry   Dispo: The patient is from: Home              Anticipated d/c is to: Pending clinical course              Patient currently is not medically stable to d/c.   Difficult to place patient No  Infusions:   sodium chloride      Scheduled Meds:  Chlorhexidine Gluconate Cloth  6 each Topical Daily   feeding supplement  237 mL Oral BID BM   magic mouthwash w/lidocaine  10 mL Oral QID   pantoprazole (PROTONIX) IV  40 mg Intravenous Daily   sucralfate  1 g Oral TID WC & HS    PRN meds: acetaminophen **OR** acetaminophen, HYDROmorphone (DILAUDID) injection, lidocaine, ondansetron (ZOFRAN) IV, sodium chloride flush    Antimicrobials: Anti-infectives (From admission, onward)    None       Objective: Vitals:   03/11/22 0314 03/11/22 1209  BP: (!) 120/56 (!) 127/55  Pulse: 100 (!) 103  Resp: 18 (!) 22  Temp: 98.4 F (36.9 C) 98.1 F (36.7 C)  SpO2: 100% 100%    Intake/Output Summary (Last 24 hours) at 03/11/2022 1330 Last data filed at 03/11/2022 0629 Gross per 24 hour  Intake 120 ml  Output 300 ml  Net -180 ml   Filed Weights   03/10/22 0431 03/10/22 0532  Weight: 85.2 kg 84.8 kg   Weight change:  Body mass index is 28.43 kg/m.   Physical Exam: General exam: Pleasant, elderly African-American female. Skin: No rashes, lesions or ulcers. HEENT: Atraumatic, normocephalic, no obvious bleeding Lungs: Clear to auscultation bilaterally CVS: Regular rate and rhythm, no murmur GI/Abd soft, nontender, nondistended, bowel sound present CNS: Alert, awake, oriented x3 Psychiatry: Mood appropriate Extremities: No pedal edema, no calf tenderness  Data Review: I have personally reviewed the laboratory data and studies available.  F/u labs ordered Unresulted Labs (From admission, onward)     Start     Ordered   03/12/22 0500  CBC with Differential/Platelet  Tomorrow morning,   R       Question:  Specimen collection method  Answer:  IV Team=IV Team collect   03/11/22 1330   03/12/22 3614  Basic metabolic panel  Tomorrow morning,   R       Question:  Specimen collection method  Answer:  IV Team=IV Team collect   03/11/22 1330            Signed, Terrilee Croak, MD [facility] 03/11/2022

## 2022-03-11 NOTE — Plan of Care (Signed)
  Problem: Education: Goal: Knowledge of General Education information will improve Description: Including pain rating scale, medication(s)/side effects and non-pharmacologic comfort measures Outcome: Progressing   Problem: Health Behavior/Discharge Planning: Goal: Ability to manage health-related needs will improve Outcome: Progressing   Problem: Clinical Measurements: Goal: Respiratory complications will improve Outcome: Progressing Goal: Cardiovascular complication will be avoided Outcome: Progressing   Problem: Elimination: Goal: Will not experience complications related to urinary retention Outcome: Progressing

## 2022-03-11 NOTE — H&P (View-Only) (Signed)
Reason for Consult: Odynophagia and dysphagia Referring Physician: [place] HPI: This is a 76 year old female with metastatic breast cancer, CAD, GERD, and multiple medical problems admitted for complaints of odynophagia and dysphagia.  Her symptoms started shortly after she started XRT for her bone mets from the breast cancer.  Prior to this time she was able to swallow without any discomfort.  Last week the Oncology office visit was positive for oral thrush and she was started on Diflucan, but this did not improve her pain.  She was still suffering with odynophagia.  The patient denies any prior issues with these symptoms before.  Past Medical History:  Diagnosis Date   Bilateral lower extremity edema    per pt left ankle greater than right weras left leg compression hose prn   Bladder cancer [facility]) 2021   urologist-- dr Milford Cage--- first dx 2021 s/p  TURBT 's with recurrences   CAD (coronary artery disease)    cardiac cath 01/ 2005 by dr Terrence Dupont,  non-obstructive cad;  nuclear study 07-31-2016  normal with no evidence ischemia,nuclear ef 52%   Diverticulosis of colon    Eczema    Fibromyalgia    GERD (gastroesophageal reflux disease)    Hemorrhoids    Hiatal hernia    History of adenomatous polyp of colon    History of cancer chemotherapy 2000   left breast cancer   History of COVID-19 2022   mild all symptoms resolved   History of diverticulitis of colon    History of gastritis    History of kidney stones    History of left breast cancer 2000   dx 2000 and s/p left mastectomy w/ node dissection;  completed chemo and radiation therapy same year 2000 and completed anti-estrogen 2005;  per pt released from oncologist 2006 and no recurrence   Hyperlipidemia    Hypertension    followed by pcp   Nephrolithiasis    per CT 11-24-2020 bilateral tiny non-obstructive stones   Personal history of radiation therapy 2000   left breast cancer   Wears denture upper     Wears glasses     Past Surgical History:  Procedure Laterality Date   ABDOMINAL HYSTERECTOMY  1978   AND LYSIS ADHESIONS /  APPENDECTOMY  (still has ovaries)   ANTERIOR AND POSTERIOR REPAIR WITH SACROSPINOUS FIXATION  07-11-2008  @[facility]    AND ENTEROCELE REPAIR/ UTEROSACRAL COLPOSUSPENSION   BREAST REDUCTION SURGERY Right 2000   CARDIAC CATHETERIZATION  05-10-2003   dr Terrence Dupont   mild non-obstructive cad,  ef 55-60%   CATARACT EXTRACTION W/ INTRAOCULAR LENS  IMPLANT, BILATERAL  2015   COLONOSCOPY  01/2018   by dr Collene Mares   CYST EXCISION  02-28-2020  @[facility]    right axilla   CYSTOSCOPY W/ RETROGRADES Bilateral 03/21/2020   Procedure: CYSTOSCOPY WITH RETROGRADE PYELOGRAM;  Surgeon: Remi Haggard, MD;  Location: [facility];  Service: Urology;  Laterality: Bilateral;   CYSTOSCOPY W/ RETROGRADES Bilateral 01/09/2021   Procedure: CYSTOSCOPY WITH RETROGRADE PYELOGRAM;  Surgeon: Remi Haggard, MD;  Location: [facility];  Service: Urology;  Laterality: Bilateral;   CYSTOSCOPY W/ RETROGRADES Bilateral 07/31/2021   Procedure: CYSTOSCOPY WITH RETROGRADE PYELOGRAM;  Surgeon: Remi Haggard, MD;  Location: [facility];  Service: Urology;  Laterality: Bilateral;   CYSTOSCOPY W/ RETROGRADES Bilateral 11/20/2021   Procedure: CYSTOSCOPY WITH RETROGRADE PYELOGRAM;  Surgeon: Remi Haggard, MD;  Location: [facility];  Service:  Urology;  Laterality: Bilateral;   INGUINAL HERNIA REPAIR  09/27/2011   Procedure: HERNIA REPAIR INGUINAL ADULT;  Surgeon: Gwenyth Ober, MD;  Location: [place];  Service: General;  Laterality: Right;   IR IMAGING GUIDED PORT INSERTION  02/21/2022   KNEE ARTHROSCOPY Left 1986   MASTECTOMY WITH AXILLARY LYMPH NODE DISSECTION Left 2000   W/  RECONSTRUCTION   NASAL SINUS SURGERY  1980s   ROTATOR CUFF REPAIR Right 1985   TONSILLECTOMY     child and adenoids removed   TRANSURETHRAL RESECTION OF BLADDER TUMOR N/A  03/21/2020   Procedure: TRANSURETHRAL RESECTION OF BLADDER TUMOR (TURBT);  Surgeon: Remi Haggard, MD;  Location: [facility];  Service: Urology;  Laterality: N/A;  [place] TUMOR N/A 01/09/2021   Procedure: TRANSURETHRAL RESECTION OF BLADDER TUMOR (TURBT);  Surgeon: Remi Haggard, MD;  Location: [facility];  Service: Urology;  Laterality: N/A;   TRANSURETHRAL RESECTION OF BLADDER TUMOR WITH MITOMYCIN-C N/A 07/31/2021   Procedure: TRANSURETHRAL RESECTION OF BLADDER TUMOR WITH GEMCITABINE;  Surgeon: Remi Haggard, MD;  Location: [facility];  Service: Urology;  Laterality: N/A;   TRANSURETHRAL RESECTION OF BLADDER TUMOR WITH MITOMYCIN-C N/A 11/20/2021   Procedure: TRANSURETHRAL RESECTION OF BLADDER TUMOR WITH POSTOPERATIVE  GEMCITABINE;  Surgeon: Remi Haggard, MD;  Location: [facility];  Service: Urology;  Laterality: N/A;  35 MINS    Family History  Problem Relation Age of Onset   Alzheimer's disease Mother    Breast cancer Sister 23   Colon polyps Sister    Cancer Son        prostate   Anesthesia problems Neg Hx     Social History:  reports that she quit smoking about 23 years ago. Her smoking use included cigarettes. She has a 15.00 pack-year smoking history. She has never used smokeless tobacco. She reports that she does not currently use alcohol. She reports that she does not use drugs.  Allergies:  Allergies  Allergen Reactions   Hydrochlorothiazide Other (See Comments)    High Ca++   Flomax [Tamsulosin] Itching   Codeine Itching   Statins Other (See Comments)    Muscle aches   Zetia [Ezetimibe] Other (See Comments)    Muscle aches    Medications: Scheduled:  Chlorhexidine Gluconate Cloth  6 each Topical Daily   feeding supplement  237 mL Oral BID BM   magic mouthwash w/lidocaine  10 mL Oral QID   pantoprazole (PROTONIX) IV  40 mg Intravenous Daily   sucralfate   1 g Oral TID WC & HS   Continuous:  sodium chloride 75 mL/hr at 03/11/22 1502    Results for orders placed or performed during the hospital encounter of 03/10/22 (from the past 24 hour(s))  Comprehensive metabolic panel     Status: Abnormal   Collection Time: 03/11/22  2:54 AM  Result Value Ref Range   Sodium 136 135 - 145 mmol/L   Potassium 4.4 3.5 - 5.1 mmol/L   Chloride 105 98 - 111 mmol/L   CO2 25 22 - 32 mmol/L   Glucose, Bld 94 70 - 99 mg/dL   BUN 8 8 - 23 mg/dL   Creatinine, Ser 0.45 0.44 - 1.00 mg/dL   Calcium 8.7 (L) 8.9 - 10.3 mg/dL   Total Protein 5.6 (L) 6.5 - 8.1 g/dL   Albumin 2.9 (L) 3.5 - 5.0 g/dL   AST 27 15 - 41 U/L  ALT 19 0 - 44 U/L   Alkaline Phosphatase 83 38 - 126 U/L   Total Bilirubin 0.6 0.3 - 1.2 mg/dL   GFR, Estimated >60 >60 mL/min   Anion gap 6 5 - 15  CBC     Status: Abnormal   Collection Time: 03/11/22  2:54 AM  Result Value Ref Range   WBC 2.5 (L) 4.0 - 10.5 K/uL   RBC 4.43 3.87 - 5.11 MIL/uL   Hemoglobin 11.7 (L) 12.0 - 15.0 g/dL   HCT 37.9 36.0 - 46.0 %   MCV 85.6 80.0 - 100.0 fL   MCH 26.4 26.0 - 34.0 pg   MCHC 30.9 30.0 - 36.0 g/dL   RDW 14.8 11.5 - 15.5 %   Platelets 102 (L) 150 - 400 K/uL   nRBC 0.0 0.0 - 0.2 %     CT Head Wo Contrast  Result Date: 03/10/2022 CLINICAL DATA:  Mental status change with unknown cause EXAM: CT HEAD WITHOUT CONTRAST TECHNIQUE: Contiguous axial images were obtained from the base of the skull through the vertex without intravenous contrast. RADIATION DOSE REDUCTION: This exam was performed according to the departmental dose-optimization program which includes automated exposure control, adjustment of the mA and/or kV according to patient size and/or use of iterative reconstruction technique. COMPARISON:  02/17/2022 brain MRI FINDINGS: Brain: Ovoid high-density at the posterior left thalamus is nonacute by MRI stability since 2020, stealthy on most sequences possibly a cavernoma. Asymmetric calcification at  the left globus pallidus. No acute infarct, hydrocephalus, collection, or mass. Generalized cerebral volume loss. Vascular: No hyperdense vessel or unexpected calcification. Skull: Normal. Negative for fracture or focal lesion. Sinuses/Orbits: No acute finding. IMPRESSION: No acute or interval finding Electronically Signed   By: Jorje Guild M.D.   On: 03/10/2022 08:17   DG Chest Port 1 View  Result Date: 03/10/2022 CLINICAL DATA:  Altered mental status EXAM: PORTABLE CHEST 1 VIEW COMPARISON:  02/16/2022 FINDINGS: Low volume chest with diffuse interstitial coarsening that has a reticulonodular appearance. Generalized nodularity by recent chest CT. There is a left paramediastinal mass which is occult by this frontal view. Right porta catheter with tip at the upper right atrium. Postoperative left axilla. IMPRESSION: 1. Low volume chest. 2. Miliary nodularity known from recent chest CT. Electronically Signed   By: Jorje Guild M.D.   On: 03/10/2022 06:53    ROS:  As stated above in the HPI otherwise negative.  Blood pressure (!) 127/55, pulse (!) 103, temperature 98.1 F (36.7 C), temperature source Oral, resp. rate (!) 22, height 5\' 8"  (1.727 m), weight 84.8 kg, SpO2 100 %.    PE: Gen: NAD, Alert and Oriented HEENT:  [place]/AT, EOMI Neck: Supple, no LAD Lungs: CTA Bilaterally CV: RRR without M/G/R ABD: Soft, NTND, +BS Ext: No C/C/E  Assessment/Plan: 1) Odynophagia. 2) Dysphagia. 3) Metastatic breast cancer.   Her symptoms started shortly after starting XRT.  There is a concern that she may have radiation induced injury to her esophagus or even an opportunistic infection such as CMV or HSV.  Plan: 1) EGD tomorrow. Lovelle Deitrick D 03/11/2022, 5:41 PM

## 2022-03-11 NOTE — Progress Notes (Signed)
[facility] [employer] [facility]) Hospital Liaison note:  This is a pending outpatient-based Palliative Care patient. Will continue to follow for disposition.  Please call with any outpatient palliative questions or concerns.  Thank you, Lorelee Market, LPN [facility] Liaison [phone]

## 2022-03-11 NOTE — TOC Initial Note (Signed)
Transition of Care [facility]) - Initial/Assessment Note    Patient Details  Name: [name] MRN: [id number] Date of Birth: 10-07-1945  Transition of Care [facility]) CM/SW Contact:    Leeroy Cha, RN Phone Number: 03/11/2022, 8:52 AM  Clinical Narrative:                  Transition of Care [facility]) Screening Note   Patient Details  Name: [name] Date of Birth: 14-Aug-1945   Transition of Care [facility]) CM/SW Contact:    Leeroy Cha, RN Phone Number: 03/11/2022, 8:52 AM    Transition of Care Department [facility]) has reviewed patient and no TOC needs have been identified at this time. We will continue to monitor patient advancement through interdisciplinary progression rounds. If new patient transition needs arise, please place a TOC consult.    Expected Discharge Plan: Home/Self Care Barriers to Discharge: Continued Medical Work up   Patient Goals and CMS Choice Patient states their goals for this hospitalization and ongoing recovery are:: to return home      Expected Discharge Plan and Services Expected Discharge Plan: Home/Self Care   Discharge Planning Services: CM Consult   Living arrangements for the past 2 months: Single Family Home                                      Prior Living Arrangements/Services Living arrangements for the past 2 months: Single Family Home Lives with:: Adult Children Patient language and need for interpreter reviewed:: Yes Do you feel safe going back to the place where you live?: Yes            Criminal Activity/Legal Involvement Pertinent to Current Situation/Hospitalization: No - Comment as needed  Activities of Daily Living Home Assistive Devices/Equipment: Eyeglasses ADL Screening (condition at time of admission) Patient's cognitive ability adequate to safely complete daily activities?: Yes Is the patient deaf or have difficulty hearing?: No Does the patient have difficulty seeing, even when wearing  glasses/contacts?: Yes Does the patient have difficulty concentrating, remembering, or making decisions?: Yes Patient able to express need for assistance with ADLs?: Yes Does the patient have difficulty dressing or bathing?: Yes Independently performs ADLs?: No Communication: Independent Dressing (OT): Needs assistance Is this a change from baseline?: Change from baseline, expected to last <3days Grooming: Needs assistance Is this a change from baseline?: Change from baseline, expected to last <3 days Feeding: Independent Bathing: Needs assistance Is this a change from baseline?: Pre-admission baseline Toileting: Needs assistance Is this a change from baseline?: Pre-admission baseline In/Out Bed: Needs assistance Is this a change from baseline?: Pre-admission baseline Walks in Home: Needs assistance Is this a change from baseline?: Pre-admission baseline Does the patient have difficulty walking or climbing stairs?: Yes Weakness of Legs: Both Weakness of Arms/Hands: Both  Permission Sought/Granted                  Emotional Assessment Appearance:: Appears stated age Attitude/Demeanor/Rapport: Engaged Affect (typically observed): Calm Orientation: : Oriented to Self, Oriented to Place, Oriented to  Time, Oriented to Situation Alcohol / Substance Use: Tobacco Use (former smoker) Psych Involvement: No (comment)  Admission diagnosis:  Dehydration [E86.0] Thrombocytopenia (HCC) [D69.6] Metastatic cancer to bone ([place]) [C79.51] Odynophagia [R13.10] Failure to thrive in adult [R62.7] Generalized weakness [R53.1] Esophagitis [K20.90] Patient Active Problem List   Diagnosis Date Noted   Odynophagia 03/10/2022   Leukopenia 03/10/2022  Thrombocytopenia ([place]) 03/10/2022   Mild protein malnutrition ([place]) 03/10/2022   Palliative care by specialist 02/22/2022   Goals of care, counseling/discussion 02/22/2022   Metastatic cancer to bone ([place]) 02/17/2022   Metastatic disease  ([place]) 02/16/2022   Iliac artery aneurysm, right ([place]) 10/09/2021   Malignant neoplasm of overlapping sites of bladder ([place]) 08/21/2020   Bilateral leg edema 03/08/2020   Mild concentric left ventricular hypertrophy (LVH) 11/25/2019   Prediabetes 11/25/2019   DDD (degenerative disc disease), thoracolumbar 44/81/8563   Diastolic dysfunction without heart failure 11/02/2019   Enchondroma of finger of right hand 07/22/2019   Slow transit constipation 07/22/2019   Venous insufficiency of both lower extremities 01/11/2019   Abdominal aortic atherosclerosis ([place]) 01/11/2019   Malignant neoplasm of left female breast ([place]) 01/11/2019   Cervical radiculopathy 07/07/2017   Hypokalemia 01/14/2014   Insomnia 01/14/2014   Mixed hyperlipidemia 12/14/2013   HTN (hypertension) 12/14/2013   GERD (gastroesophageal reflux disease) 12/14/2013   CAD (coronary artery disease) 12/14/2013   Depression 12/14/2013   Fibromyalgia 12/14/2013   Hip pain, left 12/14/2013   PCP:  [place], Betty G, MD Pharmacy:   [facility] [place], [address] [address] [place] [address] Phone: [phone] Fax: [phone]     Social Determinants of Health ([place]) Interventions    Readmission Risk Interventions   Row Labels 02/22/2022   12:08 PM 02/18/2022    3:51 PM  Readmission Risk Prevention Plan   Section Header. No data exists in this row.    Transportation Screening   Complete Complete  PCP or Specialist Appt within 5-7 Days   Complete Complete  Home Care Screening   Complete Complete  Medication Review (RN CM)   Complete Complete

## 2022-03-12 ENCOUNTER — Inpatient Hospital Stay (HOSPITAL_COMMUNITY): Payer: Medicare Other | Admitting: Anesthesiology

## 2022-03-12 ENCOUNTER — Encounter (HOSPITAL_COMMUNITY)
Admission: EM | Disposition: A | Discharge status: Home / Self Care | Payer: Self-pay | Source: Home / Self Care | Attending: Internal Medicine

## 2022-03-12 ENCOUNTER — Encounter (HOSPITAL_COMMUNITY): Payer: Self-pay | Admitting: Internal Medicine

## 2022-03-12 DIAGNOSIS — I10 Essential (primary) hypertension: Secondary | ICD-10-CM | POA: Diagnosis not present

## 2022-03-12 DIAGNOSIS — I2510 Atherosclerotic heart disease of native coronary artery without angina pectoris: Secondary | ICD-10-CM | POA: Diagnosis not present

## 2022-03-12 DIAGNOSIS — K209 Esophagitis, unspecified without bleeding: Secondary | ICD-10-CM | POA: Diagnosis not present

## 2022-03-12 DIAGNOSIS — R1310 Dysphagia, unspecified: Secondary | ICD-10-CM | POA: Diagnosis not present

## 2022-03-12 DIAGNOSIS — Z87891 Personal history of nicotine dependence: Secondary | ICD-10-CM | POA: Diagnosis not present

## 2022-03-12 HISTORY — PX: ESOPHAGOGASTRODUODENOSCOPY (EGD) WITH PROPOFOL: SHX5813

## 2022-03-12 LAB — BASIC METABOLIC PANEL
Anion gap: 5 (ref 5–15)
BUN: 7 mg/dL — ABNORMAL LOW (ref 8–23)
CO2: 23 mmol/L (ref 22–32)
Calcium: 8.5 mg/dL — ABNORMAL LOW (ref 8.9–10.3)
Chloride: 104 mmol/L (ref 98–111)
Creatinine, Ser: 0.41 mg/dL — ABNORMAL LOW (ref 0.44–1.00)
GFR, Estimated: >60 mL/min (ref >60)
Glucose, Bld: 102 mg/dL — ABNORMAL HIGH (ref 70–99)
Potassium: 4.1 mmol/L (ref 3.5–5.1)
Sodium: 132 mmol/L — ABNORMAL LOW (ref 135–145)

## 2022-03-12 LAB — CBC WITH DIFFERENTIAL/PLATELET
Abs Immature Granulocytes: 0.04 10*3/uL (ref 0.00–0.07)
Basophils Absolute: 0 10*3/uL (ref 0.0–0.1)
Basophils Relative: 0 %
Eosinophils Absolute: 0 10*3/uL (ref 0.0–0.5)
Eosinophils Relative: 1 %
HCT: 36.3 % (ref 36.0–46.0)
Hemoglobin: 11.4 g/dL — ABNORMAL LOW (ref 12.0–15.0)
Immature Granulocytes: 2 %
Lymphocytes Relative: 12 %
Lymphs Abs: 0.3 10*3/uL — ABNORMAL LOW (ref 0.7–4.0)
MCH: 26.5 pg (ref 26.0–34.0)
MCHC: 31.4 g/dL (ref 30.0–36.0)
MCV: 84.2 fL (ref 80.0–100.0)
Monocytes Absolute: 0.3 10*3/uL (ref 0.1–1.0)
Monocytes Relative: 11 %
Neutro Abs: 1.9 10*3/uL (ref 1.7–7.7)
Neutrophils Relative %: 74 %
Platelets: 100 10*3/uL — ABNORMAL LOW (ref 150–400)
RBC: 4.31 MIL/uL (ref 3.87–5.11)
RDW: 14.5 % (ref 11.5–15.5)
WBC: 2.5 10*3/uL — ABNORMAL LOW (ref 4.0–10.5)
nRBC: 0 % (ref 0.0–0.2)

## 2022-03-12 SURGERY — ESOPHAGOGASTRODUODENOSCOPY (EGD) WITH PROPOFOL
Anesthesia: Monitor Anesthesia Care

## 2022-03-12 MED ORDER — SUCCINYLCHOLINE CHLORIDE 200 MG/10ML IV SOSY
PREFILLED_SYRINGE | INTRAVENOUS | Status: DC | PRN
  Administered 2022-03-12: 80 mg via INTRAVENOUS

## 2022-03-12 MED ORDER — ADULT MULTIVITAMIN W/MINERALS CH
1.0000 | ORAL_TABLET | Freq: Every day | ORAL | Status: DC
  Administered 2022-03-12 – 2022-03-16 (×4): 1 via ORAL
  Filled 2022-03-12 (×4): qty 1

## 2022-03-12 MED ORDER — PHENYLEPHRINE 80 MCG/ML (10ML) SYRINGE FOR IV PUSH (FOR BLOOD PRESSURE SUPPORT)
PREFILLED_SYRINGE | INTRAVENOUS | Status: DC | PRN
  Administered 2022-03-12: 80 ug via INTRAVENOUS

## 2022-03-12 MED ORDER — SODIUM CHLORIDE 0.9 % IV SOLN: INTRAVENOUS | Status: DC

## 2022-03-12 MED ORDER — PROPOFOL 500 MG/50ML IV EMUL
INTRAVENOUS | Status: AC
  Filled 2022-03-12: qty 50

## 2022-03-12 MED ORDER — PROPOFOL 10 MG/ML IV BOLUS
INTRAVENOUS | Status: DC | PRN
  Administered 2022-03-12: 90 mg via INTRAVENOUS

## 2022-03-12 MED ORDER — PROPOFOL 500 MG/50ML IV EMUL
INTRAVENOUS | Status: DC | PRN
  Administered 2022-03-12: 175 ug/kg/min via INTRAVENOUS

## 2022-03-12 MED ORDER — SODIUM CHLORIDE 0.9 % IV SOLN: INTRAVENOUS | Status: DC | PRN

## 2022-03-12 MED ORDER — METOPROLOL TARTRATE 12.5 MG HALF TABLET
12.5000 mg | ORAL_TABLET | Freq: Two times a day (BID) | ORAL | Status: DC
  Administered 2022-03-12 – 2022-03-16 (×8): 12.5 mg via ORAL
  Filled 2022-03-12 (×9): qty 1

## 2022-03-12 SURGICAL SUPPLY — 15 items

## 2022-03-12 NOTE — Anesthesia Preprocedure Evaluation (Addendum)
Anesthesia Evaluation  Patient identified by MRN, date of birth, ID band Patient awake    Reviewed: Allergy & Precautions, NPO status , Patient's Chart, lab work & pertinent test results  Airway Mallampati: II  TM Distance: >3 FB Neck ROM: Full    Dental  (+) Edentulous Upper, Missing, Dental Advisory Given, Upper Dentures,    Pulmonary former smoker   Pulmonary exam normal breath sounds clear to auscultation       Cardiovascular hypertension, Pt. on medications + CAD  Normal cardiovascular exam Rhythm:Regular Rate:Normal  TTE 2021 1. Left ventricular ejection fraction, by estimation, is 60 to 65%. The left ventricle has normal function. The left ventricle has no regional wall motion abnormalities. There is mild concentric left ventricular hypertrophy. Left ventricular diastolic parameters are consistent with Grade I diastolic dysfunction (impaired relaxation).  2. Right ventricular systolic function is normal. The right ventricular size is normal.  3. The mitral valve is normal in structure. No evidence of mitral valve regurgitation. No evidence of mitral stenosis.  4. The aortic valve is normal in structure. Aortic valve regurgitation is not visualized. No aortic stenosis is present.  5. The inferior vena cava is normal in size with greater than 50% respiratory variability, suggesting right atrial pressure of 3 mmHg.   Stress Test 2018 ? The left ventricular ejection fraction is mildly decreased (45-54%). Nuclear stress EF: 52%. Blood pressure demonstrated a normal response to exercise. There was no ST segment deviation noted during stress. No T wave inversion was noted during stress. The study is normal. This is a low risk study.    Neuro/Psych  PSYCHIATRIC DISORDERS  Depression    negative neurological ROS     GI/Hepatic Neg liver ROS, hiatal hernia,GERD  ,,  Endo/Other  negative endocrine ROS    Renal/GU Renal  disease  negative genitourinary   Musculoskeletal  (+) Arthritis ,  Fibromyalgia -  Abdominal   Peds  Hematology negative hematology ROS (+)   Anesthesia Other Findings   Reproductive/Obstetrics                             Anesthesia Physical Anesthesia Plan  ASA: 3  Anesthesia Plan: General   Post-op Pain Management: Minimal or no pain anticipated   Induction: Intravenous, Rapid sequence and Cricoid pressure planned  PONV Risk Score and Plan: 2 and Treatment may vary due to age or medical condition, Ondansetron and Dexamethasone  Airway Management Planned: Oral ETT  Additional Equipment:   Intra-op Plan:   Post-operative Plan: Extubation in OR  Informed Consent: I have reviewed the patients History and Physical, chart, labs and discussed the procedure including the risks, benefits and alternatives for the proposed anesthesia with the patient or authorized representative who has indicated his/her understanding and acceptance.     Dental advisory given  Plan Discussed with: CRNA  Anesthesia Plan Comments:         Anesthesia Quick Evaluation

## 2022-03-12 NOTE — Op Note (Signed)
[facility] Patient Name: [name] Procedure Date: 03/12/2022 MRN: [id number] Attending MD: Carol Ada , MD, [id number] Date of Birth: [birth year] CSN: [id number] Age: 76 Admit Type: Outpatient Procedure:                Upper GI endoscopy Indications:              Odynophagia Providers:                Carol Ada, MD, Jeanella Cara, RN, Ladona Ridgel, Technician Referring MD:              Medicines:                General Anesthesia Complications:            No immediate complications. Estimated Blood Loss:     Estimated blood loss: none. Procedure:                Pre-Anesthesia Assessment:                           - Prior to the procedure, a History and Physical                            was performed, and patient medications and                            allergies were reviewed. The patient's tolerance of                            previous anesthesia was also reviewed. The risks                            and benefits of the procedure and the sedation                            options and risks were discussed with the patient.                            All questions were answered, and informed consent                            was obtained. Prior Anticoagulants: The patient has                            taken no anticoagulant or antiplatelet agents. ASA                            Grade Assessment: III - A patient with severe                            systemic disease. After reviewing the risks and                            benefits, the  patient was deemed in satisfactory                            condition to undergo the procedure.                           - Sedation was administered by an anesthesia                            professional. General anesthesia was attained.                           After obtaining informed consent, the endoscope was                            passed under direct vision.  Throughout the                            procedure, the patient's blood pressure, pulse, and                            oxygen saturations were monitored continuously. The                            [id number]-[id number] ([id number]) Olympus endoscope was introduced                            through the mouth, and advanced to the second part                            of duodenum. The upper GI endoscopy was                            accomplished without difficulty. The patient                            tolerated the procedure well. Scope In: Scope Out: Findings:      LA Grade D (one or more mucosal breaks involving at least 75% of       esophageal circumference) esophagitis with no bleeding was found 25 to       40 cm from the incisors.      The stomach was normal.      The examined duodenum was normal.      From 25-40 cm an LA Grade D esophagitis was identified secondary to       radiation. There was no evidence of a Candidal esophagitis, however, the       whitish mucosa was representative of sloughing mucosa. There was no       evidence of HSV or CMV ulcerations. Impression:               - LA Grade D radiation esophagitis with no bleeding.                           - Normal stomach.                           -  Normal examined duodenum.                           - No specimens collected. Moderate Sedation:      Not Applicable - Patient had care per Anesthesia. Recommendation:           - Return patient to hospital ward for ongoing care.                           - Advance diet as tolerated.                           - Continue present medications: Magic mouthwash                            with lidocaine and sucralfate. Procedure Code(s):        --- Professional ---                           [phone], Esophagogastroduodenoscopy, flexible,                            transoral; diagnostic, including collection of                            specimen(s) by brushing or washing, when performed                             (separate procedure) Diagnosis Code(s):        --- Professional ---                           F75.OITG, Radiation sickness, unspecified, initial                            encounter                           K20.80, Other esophagitis without bleeding                           R13.10, Dysphagia, unspecified CPT copyright 2022 [employer] Association. All rights reserved. The codes documented in this report are preliminary and upon coder review may  be revised to meet current compliance requirements. Carol Ada, MD Carol Ada, MD 03/12/2022 2:38:31 PM This report has been signed electronically. Number of Addenda: 0

## 2022-03-12 NOTE — Progress Notes (Signed)
Initial Nutrition Assessment  INTERVENTION:   -Ensure Plus High Protein po BID, each supplement provides 350 kcal and 20 grams of protein.   -Multivitamin with minerals daily  NUTRITION DIAGNOSIS:   Increased nutrient needs related to cancer and cancer related treatments as evidenced by estimated needs.  GOAL:   Patient will meet greater than or equal to 90% of their needs  MONITOR:   PO intake, Supplement acceptance, Labs, I & O's, Skin, Weight trends  REASON FOR ASSESSMENT:   Malnutrition Screening Tool    ASSESSMENT:   76 y.o. female with PMH significant for HTN, HLD, CAD, bilateral lower extremity lymphedema, bladder cancer, diverticulosis, GERD, fibromyalgia, left breast cancer status post chemo/radiation  11/19, patient was brought to the ED with complaint of progressively worsening dysphagia/odynophagia in the last few weeks causing poor oral intake, hydration and weakness.  Patient in room sleeping, did not wake upon visit. Pt's son at bedside. Per son, pt was eating well while living with her sister until a few weeks ago she developed some swallowing difficulties. She has been treated for thrush. Was NPO earlier today awaiting EGD. Per EGD results, pt with radiation esophagitis.  Pt's son was unable to given any weight history.  Pt takes Vitamin D, B-12, and biotin at home.  Will continue Ensure supplements for additional kcals and protein. Will add daily MVI.   Medications: Magic mouthwash w/ lidocaine, Carafate  Labs reviewed:  Low Na  NUTRITION - FOCUSED PHYSICAL EXAM:  Flowsheet Row Most Recent Value  Orbital Region No depletion  Upper Arm Region Unable to assess  Thoracic and Lumbar Region Unable to assess  Buccal Region No depletion  Temple Region No depletion  Clavicle Bone Region Unable to assess  Clavicle and Acromion Bone Region Unable to assess  Scapular Bone Region Unable to assess  Dorsal Hand Unable to assess  Patellar Region Unable to assess   Anterior Thigh Region Unable to assess  Posterior Calf Region Unable to assess  Edema (RD Assessment) Mild  [BLE]  Hair Reviewed  Eyes Unable to assess  [sleeping, kept eyes closed]  Mouth Unable to assess  Skin Unable to assess       Diet Order:   Diet Order             Diet regular Room service appropriate? Yes; Fluid consistency: Thin  Diet effective now                   EDUCATION NEEDS:   No education needs have been identified at this time  Skin:  Skin Assessment: Reviewed RN Assessment  Last BM:  11/18  Height:   Ht Readings from Last 1 Encounters:  03/10/22 5\' 8"  (1.727 m)    Weight:   Wt Readings from Last 1 Encounters:  03/10/22 84.8 kg   BMI:  Body mass index is 28.43 kg/m.  Estimated Nutritional Needs:   Kcal:  1900-2100  Protein:  95-105g  Fluid:  2L/day    Clayton Bibles, MS, RD, LDN Inpatient Clinical Dietitian Contact information available via Amion

## 2022-03-12 NOTE — Plan of Care (Signed)
  Problem: Education: Goal: Knowledge of General Education information will improve Description Including pain rating scale, medication(s)/side effects and non-pharmacologic comfort measures Outcome: Progressing   Problem: Health Behavior/Discharge Planning: Goal: Ability to manage health-related needs will improve Outcome: Progressing   

## 2022-03-12 NOTE — Transfer of Care (Signed)
Immediate Anesthesia Transfer of Care Note  Patient: [name]  Procedure(s) Performed: ESOPHAGOGASTRODUODENOSCOPY (EGD) WITH PROPOFOL  Patient Location: PACU  Anesthesia Type:General  Level of Consciousness: awake, alert , and oriented  Airway & Oxygen Therapy: Patient Spontanous Breathing and Patient connected to face mask oxygen  Post-op Assessment: Report given to RN and Post -op Vital signs reviewed and stable  Post vital signs: Reviewed and stable  Last Vitals:  Vitals Value Taken Time  BP    Temp    Pulse    Resp    SpO2      Last Pain:  Vitals:   03/12/22 1339  TempSrc: Temporal  PainSc: 0-No pain      Patients Stated Pain Goal: 0 (32/54/98 2641)  Complications: No notable events documented.

## 2022-03-12 NOTE — Progress Notes (Signed)
PROGRESS NOTE  [name]  DOB: [birth year]  PCP: Martinique, Betty G, MD GMW:[id number]  DOA: 03/10/2022  LOS: 1 day  Hospital Day: 3  Brief narrative: [name] is a 76 y.o. female with PMH significant for HTN, HLD, CAD, bilateral lower extremity lymphedema, bladder cancer, diverticulosis, GERD, fibromyalgia, left breast cancer status post chemo/radiation 11/19, patient was brought to the ED with complaint of progressively worsening dysphagia/odynophagia in the last few weeks causing poor oral intake, hydration and weakness.  In the ED, patient was afebrile, heart rate in 60s, blood pressure 150/67, breathing on room air. Labs mostly unremarkable, hemoglobin 12.6. Urinalysis with clear yellow urine with trace leukocytes CT head unremarkable Chest x-ray with low volume chest miliary nodularity known from recent chest CT.   Admitted to [facility]  Subjective: Patient was seen and examined this morning.   Lying down in bed.  Not in distress.  Patient states that yesterday she felt better and was able to tolerate some oral intake after maximal dose with lidocaine.  Remains n.p.o. this morning for EGD.  Family not at bedside. Remains tachycardic in 100s in the last 24 hours.  Assessment and plan: Odynophagia/dysphagia With history of radiation for left breast cancer.  Recently completed radiation. Presented with progressively worsening odynophagia, dysphagia, poor oral intake/hydration leading to lethargy. It seems he was tried on a course of fluconazole, biotin without success. Currently on IV Protonix scheduled Magic mouthwash with lidocaine 4 times daily.  Continue viscous lidocaine as needed  GI consulted. Pending EGD today.  Chronic diastolic dysfunction Essential hypertension Volume depleted on exam. PTA on losartan 100 mg daily, Lasix 20 mg daily.  Blood pressure running in normal range without meds.  Continue to monitor. Stop IV hydration.  Because of persistent  tachycardia, will start on metoprolol 12.5 mg daily  Prediabetes Blood sugar level remains controlled  CAD  hyperlipidemia aspirin is on hold pending EGD. Continue Crestor   GERD  Continue IV PPI   Left breast cancer with bone mets History of chemo and radiation Continue as needed pain meds  Leukopenia In the setting of cancer/chemo/radiotherapy. Monitor white blood cell count. Recent Labs  Lab 03/10/22 0728 03/11/22 0254 03/12/22 0402  WBC 3.0* 2.5* 2.5*   Thrombocytopenia  Platelet low and stable.  Continue to monitor Recent Labs  Lab 03/10/22 0728 03/11/22 0254 03/12/22 0402  PLT 123* 102* 100*    Mild protein malnutrition  Liquid protein supplementation.  Goals of care   Code Status: Full Code    Mobility: PT eval ordered  Skin assessment:     Nutritional status:  Body mass index is 28.43 kg/m.          Diet:  Diet Order             Diet NPO time specified Except for: Sips with Meds  Diet effective midnight           Diet NPO time specified  Diet effective midnight                   DVT prophylaxis:  SCDs Start: 03/10/22 1753   Antimicrobials: None Fluid: Stop IV fluid Consultants: GI Family Communication: No family at bedside  Status is: Observation  Continue in-hospital care because: Significant odynophagia/dysphagia Level of care: Telemetry   Dispo: The patient is from: Home              Anticipated d/c is to: Pending clinical course  Patient currently is not medically stable to d/c.   Difficult to place patient No     Infusions:     Scheduled Meds:  [MAR Hold] Chlorhexidine Gluconate Cloth  6 each Topical Daily   [MAR Hold] feeding supplement  237 mL Oral BID BM   [MAR Hold] magic mouthwash w/lidocaine  10 mL Oral QID   metoprolol tartrate  12.5 mg Oral BID   [MAR Hold] pantoprazole (PROTONIX) IV  40 mg Intravenous Daily   [MAR Hold] sucralfate  1 g Oral TID WC & HS    PRN meds: [MAR Hold]  acetaminophen **OR** [MAR Hold] acetaminophen, [MAR Hold]  HYDROmorphone (DILAUDID) injection, [MAR Hold] lidocaine, [MAR Hold] ondansetron (ZOFRAN) IV, [MAR Hold] sodium chloride flush   Antimicrobials: Anti-infectives (From admission, onward)    None       Objective: Vitals:   03/12/22 1211 03/12/22 1339  BP: 138/72 (!) 140/51  Pulse: (!) 103 (!) 119  Resp: 18 (!) 21  Temp: 97.8 F (36.6 C) (!) 97.2 F (36.2 C)  SpO2: 100% 94%    Intake/Output Summary (Last 24 hours) at 03/12/2022 1414 Last data filed at 03/12/2022 1107 Gross per 24 hour  Intake 1504.47 ml  Output 600 ml  Net 904.47 ml   Filed Weights   03/10/22 0431 03/10/22 0532  Weight: 85.2 kg 84.8 kg   Weight change:  Body mass index is 28.43 kg/m.   Physical Exam: General exam: Pleasant, elderly African-American female. Skin: No rashes, lesions or ulcers. HEENT: Atraumatic, normocephalic, no obvious bleeding Lungs: Clear to auscultation bilaterally CVS: Regular rate and rhythm, no murmur GI/Abd soft, nontender, nondistended, bowel sound present CNS: Alert, awake, oriented x3 Psychiatry: Mood appropriate Extremities: No pedal edema, no calf tenderness  Data Review: I have personally reviewed the laboratory data and studies available.  F/u labs ordered Unresulted Labs (From admission, onward)     Start     Ordered   03/13/22 0500  CBC with Differential/Platelet  Tomorrow morning,   R       Question:  Specimen collection method  Answer:  IV Team=IV Team collect   03/12/22 1414   03/13/22 8088  Basic metabolic panel  Tomorrow morning,   R       Question:  Specimen collection method  Answer:  IV Team=IV Team collect   03/12/22 1414            Signed, Terrilee Croak, MD [facility] 03/12/2022

## 2022-03-12 NOTE — Interval H&P Note (Signed)
History and Physical Interval Note:  03/12/2022 1:44 PM  [name]  has presented today for surgery, with the diagnosis of Odynophagia/Dysphagia.  The various methods of treatment have been discussed with the patient and family. After consideration of risks, benefits and other options for treatment, the patient has consented to  Procedure(s): ESOPHAGOGASTRODUODENOSCOPY (EGD) WITH PROPOFOL (N/A) as a surgical intervention.  The patient's history has been reviewed, patient examined, no change in status, stable for surgery.  I have reviewed the patient's chart and labs.  Questions were answered to the patient's satisfaction.     Jacinto Keil D

## 2022-03-12 NOTE — Anesthesia Procedure Notes (Signed)
Procedure Name: Intubation Date/Time: 03/12/2022 2:15 PM  Performed by: Lonnel Gjerde D, CRNAPre-anesthesia Checklist: Patient identified, Emergency Drugs available, Suction available and Patient being monitored Patient Re-evaluated:Patient Re-evaluated prior to induction Oxygen Delivery Method: Circle system utilized Preoxygenation: Pre-oxygenation with 100% oxygen Induction Type: IV induction Ventilation: Mask ventilation without difficulty Laryngoscope Size: Mac and 4 Grade View: Grade I Tube type: Oral Tube size: 7.0 mm Number of attempts: 1 Airway Equipment and Method: Stylet and Oral airway Placement Confirmation: ETT inserted through vocal cords under direct vision, positive ETCO2 and breath sounds checked- equal and bilateral Secured at: 21 cm Tube secured with: Tape Dental Injury: Teeth and Oropharynx as per pre-operative assessment

## 2022-03-13 DIAGNOSIS — R1310 Dysphagia, unspecified: Secondary | ICD-10-CM | POA: Diagnosis not present

## 2022-03-13 DIAGNOSIS — I10 Essential (primary) hypertension: Secondary | ICD-10-CM

## 2022-03-13 DIAGNOSIS — D696 Thrombocytopenia, unspecified: Secondary | ICD-10-CM | POA: Diagnosis not present

## 2022-03-13 DIAGNOSIS — C7951 Secondary malignant neoplasm of bone: Secondary | ICD-10-CM

## 2022-03-13 DIAGNOSIS — I5189 Other ill-defined heart diseases: Secondary | ICD-10-CM

## 2022-03-13 DIAGNOSIS — K208 Other esophagitis without bleeding: Secondary | ICD-10-CM

## 2022-03-13 DIAGNOSIS — K219 Gastro-esophageal reflux disease without esophagitis: Secondary | ICD-10-CM

## 2022-03-13 DIAGNOSIS — E860 Dehydration: Secondary | ICD-10-CM

## 2022-03-13 DIAGNOSIS — R531 Weakness: Secondary | ICD-10-CM

## 2022-03-13 DIAGNOSIS — T66XXXA Radiation sickness, unspecified, initial encounter: Secondary | ICD-10-CM

## 2022-03-13 LAB — CBC WITH DIFFERENTIAL/PLATELET
Abs Immature Granulocytes: 0.03 10*3/uL (ref 0.00–0.07)
Basophils Absolute: 0 10*3/uL (ref 0.0–0.1)
Basophils Relative: 1 %
Eosinophils Absolute: 0 10*3/uL (ref 0.0–0.5)
Eosinophils Relative: 0 %
HCT: 35.5 % — ABNORMAL LOW (ref 36.0–46.0)
Hemoglobin: 11.1 g/dL — ABNORMAL LOW (ref 12.0–15.0)
Immature Granulocytes: 1 %
Lymphocytes Relative: 13 %
Lymphs Abs: 0.4 10*3/uL — ABNORMAL LOW (ref 0.7–4.0)
MCH: 26.1 pg (ref 26.0–34.0)
MCHC: 31.3 g/dL (ref 30.0–36.0)
MCV: 83.3 fL (ref 80.0–100.0)
Monocytes Absolute: 0.3 10*3/uL (ref 0.1–1.0)
Monocytes Relative: 9 %
Neutro Abs: 2.3 10*3/uL (ref 1.7–7.7)
Neutrophils Relative %: 76 %
Platelets: 106 10*3/uL — ABNORMAL LOW (ref 150–400)
RBC: 4.26 MIL/uL (ref 3.87–5.11)
RDW: 14.5 % (ref 11.5–15.5)
WBC: 2.9 10*3/uL — ABNORMAL LOW (ref 4.0–10.5)
nRBC: 0 % (ref 0.0–0.2)

## 2022-03-13 LAB — BASIC METABOLIC PANEL
Anion gap: 6 (ref 5–15)
BUN: 6 mg/dL — ABNORMAL LOW (ref 8–23)
CO2: 24 mmol/L (ref 22–32)
Calcium: 8.4 mg/dL — ABNORMAL LOW (ref 8.9–10.3)
Chloride: 108 mmol/L (ref 98–111)
Creatinine, Ser: 0.34 mg/dL — ABNORMAL LOW (ref 0.44–1.00)
GFR, Estimated: >60 mL/min (ref >60)
Glucose, Bld: 109 mg/dL — ABNORMAL HIGH (ref 70–99)
Potassium: 3.8 mmol/L (ref 3.5–5.1)
Sodium: 138 mmol/L (ref 135–145)

## 2022-03-13 MED ORDER — POLYVINYL ALCOHOL 1.4 % OP SOLN
1.0000 [drp] | Freq: Every day | OPHTHALMIC | Status: DC | PRN
  Filled 2022-03-13: qty 15

## 2022-03-13 MED ORDER — OXYCODONE HCL 5 MG PO TABS
15.0000 mg | ORAL_TABLET | Freq: Two times a day (BID) | ORAL | Status: DC | PRN
  Administered 2022-03-13 – 2022-03-14 (×3): 15 mg via ORAL
  Filled 2022-03-13 (×3): qty 3

## 2022-03-13 MED ORDER — MECLIZINE HCL 25 MG PO TABS: 25.0000 mg | ORAL_TABLET | Freq: Three times a day (TID) | ORAL | Status: DC | PRN

## 2022-03-13 MED ORDER — POLYETHYLENE GLYCOL 3350 17 G PO PACK
17.0000 g | PACK | Freq: Every day | ORAL | Status: DC
  Administered 2022-03-14 – 2022-03-15 (×2): 17 g via ORAL
  Filled 2022-03-13 (×3): qty 1

## 2022-03-13 MED ORDER — PANTOPRAZOLE SODIUM 40 MG PO TBEC
40.0000 mg | DELAYED_RELEASE_TABLET | Freq: Every day | ORAL | Status: DC
  Administered 2022-03-13 – 2022-03-16 (×4): 40 mg via ORAL
  Filled 2022-03-13 (×4): qty 1

## 2022-03-13 MED ORDER — LORATADINE 10 MG PO TABS
10.0000 mg | ORAL_TABLET | Freq: Every day | ORAL | Status: DC
  Administered 2022-03-14 – 2022-03-16 (×3): 10 mg via ORAL
  Filled 2022-03-13 (×4): qty 1

## 2022-03-13 MED ORDER — VITAMIN B-12 1000 MCG PO TABS
1000.0000 ug | ORAL_TABLET | Freq: Every day | ORAL | Status: DC
  Administered 2022-03-13 – 2022-03-16 (×4): 1000 ug via ORAL
  Filled 2022-03-13 (×4): qty 1

## 2022-03-13 NOTE — Progress Notes (Signed)
PROGRESS NOTE    [name]  ALP:[id number] DOB: 10-Aug-1945 DOA: 03/10/2022 PCP: Martinique, Betty G, MD    Chief Complaint  Patient presents with   Flank Pain    Brief Narrative:  [name] is a 76 y.o. female with PMH significant for HTN, HLD, CAD, bilateral lower extremity lymphedema, bladder cancer, diverticulosis, GERD, fibromyalgia, left breast cancer status post chemo/radiation 11/19, patient was brought to the ED with complaint of progressively worsening dysphagia/odynophagia in the last few weeks causing poor oral intake, hydration and weakness.   In the ED, patient was afebrile, heart rate in 60s, blood pressure 150/67, breathing on room air. Labs mostly unremarkable, hemoglobin 12.6. Urinalysis with clear yellow urine with trace leukocytes CT head unremarkable Chest x-ray with low volume chest miliary nodularity known from recent chest CT.   Admitted to [facility]   Assessment & Plan:   Principal Problem:   Odynophagia Active Problems:   Mixed hyperlipidemia   HTN (hypertension)   GERD (gastroesophageal reflux disease)   CAD (coronary artery disease)   Malignant neoplasm of left female breast (HCC)   Diastolic dysfunction without heart failure   Prediabetes   Metastatic cancer to bone (HCC)   Leukopenia   Thrombocytopenia (HCC)   Mild protein malnutrition (HCC)  #1 odynophagia/dysphagia secondary to LA grade D radiation esophagitis -Patient presenting with odynophagia/dysphagia, history of radiation for left breast cancer recently completed radiation. -Patient noted to have had worsening odynophagia/dysphagia with poor oral intake/hydration leading to lethargy. -Status post trial of fluconazole, Biotene without any significant improvement. -On IV PPI, Magic mouthwash with lidocaine 4 times daily with some improvement. -Patient seen by GI underwent upper endoscopy 03/12/2022 which showed LA grade D radiation esophagitis without any bleeding. -Patient  seen by GI recommending continuation of PPI, Magic mouthwash with lidocaine, Carafate. -Outpatient follow-up with GI, Dr. Collene Mares upon discharge.  2.  Chronic diastolic CHF/hypertension -Patient noted to be volume depleted on exam secondary to poor oral intake. -Patient noted to be on losartan 100 mg daily, Lasix 20 mg daily prior to admission. -BP currently stable off antihypertensive medications. -Patient started on low-dose beta-blocker due to tachycardia which we will continue. -Supportive care.  3.  CAD/hyperlipidemia -Aspirin currently on hold pending EGD which was done yesterday.  Will defer to GI when aspirin may be resumed. -Continue statin.  4.  GERD -IV PPI changed to oral PPI daily.  5.  Left breast cancer with bone mets -Patient status post chemoradiation. -Continue current pain meds as needed. -Patient follow-up with oncology.  6.  Leukopenia/thrombocytopenia -In the setting of chemo/radiotherapy. -Patient with no overt signs of bleeding or infection. -WBC slowly trending up as well as platelet count. -Follow-up.  7.  Mild protein malnutrition -Nutritional supplementation.   DVT prophylaxis:scd Code Status: Full Family Communication: Updated sister at bedside. Disposition: Likely home with home health  Status is: Inpatient Remains inpatient appropriate because: Severity of illness.   Consultants:  Gastroenterology: Dr. Benson Norway 03/11/2022  Procedures: CT head 03/10/2022 Chest x-ray 03/10/2022 Upper endoscopy per Dr. Benson Norway 03/12/2022  Antimicrobials:  None   Subjective: States swallowing is improving.  No chest pain.  No shortness of breath.  No abdominal pain.  Objective: Vitals:   03/12/22 2037 03/13/22 0610 03/13/22 0906 03/13/22 1306  BP: 125/66 123/74 (!) 129/58 137/69  Pulse: (!) 103 (!) 128 (!) 104 100  Resp: 20 20 (!) 23 18  Temp: 98.3 F (36.8 C) 98.1 F (36.7 C) 98.4 F (36.9 C) 98.6 F (37  C)  TempSrc: Oral Oral Oral Oral  SpO2: 92%  100% 100% 99%  Weight:      Height:        Intake/Output Summary (Last 24 hours) at 03/13/2022 1339 Last data filed at 03/13/2022 9292 Gross per 24 hour  Intake 150 ml  Output 400 ml  Net -250 ml   Filed Weights   03/10/22 0431 03/10/22 0532  Weight: 85.2 kg 84.8 kg    Examination:  General exam: Appears calm and comfortable  Respiratory system: Clear to auscultation. Respiratory effort normal. Cardiovascular system: S1 & S2 heard, RRR. No JVD, murmurs, rubs, gallops or clicks. No pedal edema. Gastrointestinal system: Abdomen is nondistended, soft and nontender. No organomegaly or masses felt. Normal bowel sounds heard. Central nervous system: Alert and oriented. No focal neurological deficits. Extremities: Symmetric 5 x 5 power. Skin: No rashes, lesions or ulcers Psychiatry: Judgement and insight appear normal. Mood & affect appropriate.     Data Reviewed: I have personally reviewed following labs and imaging studies  CBC: Recent Labs  Lab 03/10/22 0728 03/11/22 0254 03/12/22 0402 03/13/22 0332  WBC 3.0* 2.5* 2.5* 2.9*  NEUTROABS 2.5  --  1.9 2.3  HGB 12.6 11.7* 11.4* 11.1*  HCT 41.7 37.9 36.3 35.5*  MCV 85.6 85.6 84.2 83.3  PLT 123* 102* 100* 106*    Basic Metabolic Panel: Recent Labs  Lab 03/10/22 0728 03/11/22 0254 03/12/22 0402 03/13/22 0332  NA 133* 136 132* 138  K 4.5 4.4 4.1 3.8  CL 103 105 104 108  CO2 24 25 23 24   GLUCOSE 114* 94 102* 109*  BUN 11 8 7* 6*  CREATININE 0.59 0.45 0.41* 0.34*  CALCIUM 9.1 8.7* 8.5* 8.4*    GFR: Estimated Creatinine Clearance: 68.3 mL/min (A) (by C-[name] formula based on SCr of 0.34 mg/dL (L)).  Liver Function Tests: Recent Labs  Lab 03/10/22 0728 03/11/22 0254  AST 22 27  ALT 14 19  ALKPHOS 96 83  BILITOT 0.7 0.6  PROT 6.3* 5.6*  ALBUMIN 3.3* 2.9*    CBG: No results for input(s): "GLUCAP" in the last 168 hours.   No results found for this or any previous visit (from the past 240 hour(s)).        Radiology Studies: No results found.      Scheduled Meds:  Chlorhexidine Gluconate Cloth  6 each Topical Daily   cyanocobalamin  1,000 mcg Oral Daily   feeding supplement  237 mL Oral BID BM   loratadine  10 mg Oral Daily   magic mouthwash w/lidocaine  10 mL Oral QID   metoprolol tartrate  12.5 mg Oral BID   multivitamin with minerals  1 tablet Oral Daily   pantoprazole  40 mg Oral Daily   polyethylene glycol  17 [name] Oral Daily   sucralfate  1 [name] Oral TID WC & HS   Continuous Infusions:   LOS: 2 days    Time spent: 35 minutes    Irine Seal, MD [facility]   To contact the attending provider between 7A-7P or the covering provider during after hours 7P-7A, please log into the web site [email or web address] and access using universal [place] password for that web site. If you do not have the password, please call the hospital operator.  03/13/2022, 1:39 PM

## 2022-03-13 NOTE — Progress Notes (Signed)
Subjective: Feeling better.  She is swallowing better.  Objective: Vital signs in last 24 hours: Temp:  [97.2 F (36.2 C)-98.6 F (37 C)] 98.4 F (36.9 C) (11/22 0906) Pulse Rate:  [103-128] 104 (11/22 0906) Resp:  [17-29] 23 (11/22 0906) BP: (123-150)/(51-74) 129/58 (11/22 0906) SpO2:  [92 %-100 %] 100 % (11/22 0906) Last BM Date : 03/12/22  Intake/Output from previous day: 11/21 0701 - 11/22 0700 In: 533.8 [I.V.:533.8] Out: 400 [Urine:400] Intake/Output this shift: No intake/output data recorded.  General appearance: alert and fatigued GI: soft, non-tender; bowel sounds normal; no masses,  no organomegaly  Lab Results: Recent Labs    03/11/22 0254 03/12/22 0402 03/13/22 0332  WBC 2.5* 2.5* 2.9*  HGB 11.7* 11.4* 11.1*  HCT 37.9 36.3 35.5*  PLT 102* 100* 106*   BMET Recent Labs    03/11/22 0254 03/12/22 0402 03/13/22 0332  NA 136 132* 138  K 4.4 4.1 3.8  CL 105 104 108  CO2 25 23 24   GLUCOSE 94 102* 109*  BUN 8 7* 6*  CREATININE 0.45 0.41* 0.34*  CALCIUM 8.7* 8.5* 8.4*   LFT Recent Labs    03/11/22 0254  PROT 5.6*  ALBUMIN 2.9*  AST 27  ALT 19  ALKPHOS 83  BILITOT 0.6   PT/INR No results for input(s): "LABPROT", "INR" in the last 72 hours. Hepatitis Panel No results for input(s): "HEPBSAG", "HCVAB", "HEPAIGM", "HEPBIGM" in the last 72 hours. C-Diff No results for input(s): "CDIFFTOX" in the last 72 hours. Fecal Lactopherrin No results for input(s): "FECLLACTOFRN" in the last 72 hours.  Studies/Results: No results found.  Medications: Scheduled:  Chlorhexidine Gluconate Cloth  6 each Topical Daily   cyanocobalamin  1,000 mcg Oral Daily   feeding supplement  237 mL Oral BID BM   loratadine  10 mg Oral Daily   magic mouthwash w/lidocaine  10 mL Oral QID   metoprolol tartrate  12.5 mg Oral BID   multivitamin with minerals  1 tablet Oral Daily   pantoprazole  40 mg Oral Daily   polyethylene glycol  17 g Oral Daily   sucralfate  1 g Oral  TID WC & HS   Continuous:  Assessment/Plan: 1) Radiation-induced esophagitis. 2) Dysphagia/odynophagia - improved. 3) Metastatic breast cancer.   She is progressing well with symptomatic treatment.  Her PO intake is increasing.  The only concern for the future is the possibility of a radiation stricture, however, this can be managed if it develops.   Plan: 1) Continue with Magic mouthwash, sucralfate, and pantoprazole.  Discharge home with a one month supply. 2) Follow up with Dr. Collene Mares upon discharge. 3) Signing off.  LOS: 2 days   [name] D 03/13/2022, 12:04 PM

## 2022-03-13 NOTE — Evaluation (Signed)
Occupational Therapy Evaluation Patient Details Name: [name] MRN: [id number] DOB: 1945/11/15 Today's Date: 03/13/2022   History of Present Illness [name] is a 76 y.o. female with PMH significant for HTN, HLD, CAD, bilateral lower extremity lymphedema, bladder cancer, diverticulosis, GERD, fibromyalgia, left breast cancer status post chemo/radiation  11/19, patient was brought to the ED with complaint of progressively worsening dysphagia/odynophagia in the last few weeks causing poor oral intake, hydration and weakness. Patient admitted for [name] PANAGIOTA PERFETTI is a 76 y.o. female with PMH significant for HTN, HLD, CAD, bilateral lower extremity lymphedema, bladder cancer, diverticulosis, GERD, fibromyalgia, left breast cancer status post chemo/radiation  11/19, patient was brought to the ED with complaint of progressively worsening dysphagia/odynophagia in the last few weeks causing poor oral intake, hydration and weakness.   Clinical Impression   Mrs. [name] is a 76 year old woman who presents with generalized weakness, decreased activity tolerance and impaired balance. Patient needs min assist for standing and only able to take steps to recliner due to complaints of dizziness. Her BP was [facility] however. She needs increased assistance for ADLs. Patient will benefit from skilled OT services while in hospital to improve deficits and learn compensatory strategies as needed in order to return to PLOF.  Recommend HH at discharge.  Patient has assistance of family at discharge.      Recommendations for follow up therapy are one component of a multi-disciplinary discharge planning process, led by the attending physician.  Recommendations may be updated based on patient status, additional functional criteria and insurance authorization.   Follow Up Recommendations  Home health OT     Assistance Recommended at Discharge Frequent or constant Supervision/Assistance  Patient can  return home with the following A little help with walking and/or transfers;A lot of help with bathing/dressing/bathroom;Assistance with cooking/housework;Direct supervision/assist for financial management;Assist for transportation;Direct supervision/assist for medications management;Help with stairs or ramp for entrance    Functional Status Assessment  Patient has had a recent decline in their functional status and demonstrates the ability to make significant improvements in function in a reasonable and predictable amount of time.  Equipment Recommendations  None recommended by OT    Recommendations for Other Services       Precautions / Restrictions Precautions Precautions: Fall Restrictions Weight Bearing Restrictions: No      Mobility Bed Mobility Overal bed mobility: Needs Assistance Bed Mobility: Supine to Sit     Supine to sit: Min assist, HOB elevated          Transfers Overall transfer level: Needs assistance Equipment used: Rolling walker (2 wheels) Transfers: Sit to/from Stand Sit to Stand: Min assist, +2 safety/equipment           General transfer comment: Min assist to stand and take steps to recliner. Patient dizzy and weak with standing so unable to ambulate.      Balance Overall balance assessment: Needs assistance Sitting-balance support: No upper extremity supported, Feet supported Sitting balance-Leahy Scale: Fair     Standing balance support: During functional activity, Reliant on assistive device for balance Standing balance-Leahy Scale: Poor                             ADL either performed or assessed with clinical judgement   ADL Overall ADL's : Needs assistance/impaired Eating/Feeding: Set up;Sitting   Grooming: Set up;Sitting   Upper Body Bathing: Minimal assistance;Sitting   Lower Body Bathing: Moderate assistance;Sit to/from  stand   Upper Body Dressing : Minimal assistance;Sitting   Lower Body Dressing: Moderate  assistance;Sit to/from stand   Toilet Transfer: Minimal assistance;BSC/3in1;Rolling walker (2 wheels)   Toileting- Clothing Manipulation and Hygiene: Moderate assistance;Sit to/from stand       Functional mobility during ADLs: Minimal assistance       Vision Patient Visual Report: No change from baseline       Perception     Praxis      Pertinent Vitals/Pain Pain Assessment Pain Assessment: No/denies pain     Hand Dominance Right   Extremity/Trunk Assessment Upper Extremity Assessment Upper Extremity Assessment: Generalized weakness   Lower Extremity Assessment Lower Extremity Assessment: Defer to PT evaluation   Cervical / Trunk Assessment Cervical / Trunk Assessment: Normal   Communication Communication Communication: No difficulties   Cognition Arousal/Alertness: Awake/alert Behavior During Therapy: WFL for tasks assessed/performed Overall Cognitive Status: Within Functional Limits for tasks assessed                                 General Comments: Appears to have some short term memory deficits but overall functional cognition. Patient's sister present and reports on PLOF that patient got wrong     General Comments       Exercises     Shoulder Instructions      Home Living Family/patient expects to be discharged to:: Private residence Living Arrangements: Other relatives (sister)   Type of Home: House Home Access: Stairs to enter Technical brewer of Steps: 2   Home Layout: One level         Biochemist, clinical: Grants: Conservation officer, nature (2 wheels);BSC/3in1;Shower seat          Prior Functioning/Environment Prior Level of Function : Needs assist             Mobility Comments: uses a walker, been staying at her sister's house, has help for transfers ADLs Comments: Getting Palliative Care, has help for bathing once a week, needs clothes laid out and assistance with fasteners, help to transfer to  [facility]        OT Problem List: Decreased strength;Decreased activity tolerance;Impaired balance (sitting and/or standing);Decreased knowledge of use of DME or AE;Decreased knowledge of precautions      OT Treatment/Interventions: Self-care/ADL training;Therapeutic exercise;DME and/or AE instruction;Therapeutic activities;Balance training;Patient/family education    OT Goals(Current goals can be found in the care plan section) Acute Rehab OT Goals Patient Stated Goal: get stronger OT Goal Formulation: With patient Time For Goal Achievement: 03/27/22 Potential to Achieve Goals: Fair  OT Frequency: Min 2X/week    Co-evaluation              AM-PAC OT "6 Clicks" Daily Activity     Outcome Measure Help from another person eating meals?: A Little Help from another person taking care of personal grooming?: A Little Help from another person toileting, which includes using toliet, bedpan, or urinal?: A Lot Help from another person bathing (including washing, rinsing, drying)?: A Lot Help from another person to put on and taking off regular upper body clothing?: A Little Help from another person to put on and taking off regular lower body clothing?: A Lot 6 Click Score: 15   End of Session Equipment Utilized During Treatment: Rolling walker (2 wheels) Nurse Communication: Mobility status  Activity Tolerance: Patient tolerated treatment well Patient left: in chair;with call bell/phone within reach;with family/visitor  present  OT Visit Diagnosis: Muscle weakness (generalized) (M62.81)                Time: 1538-1600 OT Time Calculation (min): 22 min Charges:  OT General Charges $OT Visit: 1 Visit OT Evaluation $OT Eval Low Complexity: 1 Low  Gustavo Lah, OTR/L [place]  Office [phone]   Lenward Chancellor 03/13/2022, 4:11 PM

## 2022-03-13 NOTE — Progress Notes (Signed)
  Transition of Care [facility]) Screening Note   Patient Details  Name: [name] Date of Birth: [birth year]   [facility] of [facility]) CM/SW Contact:    Roseanne Kaufman, RN Phone Number: 03/13/2022, 9:28 AM    Transition of Care Department [facility]) has reviewed patient and no TOC needs have been identified at this time. We will continue to monitor patient advancement through interdisciplinary progression rounds. If new patient transition needs arise, please place a TOC consult.

## 2022-03-13 NOTE — Plan of Care (Signed)
  Problem: Elimination: Goal: Will not experience complications related to bowel motility Outcome: Progressing Goal: Will not experience complications related to urinary retention Outcome: Progressing   Problem: Pain Managment: Goal: General experience of comfort will improve Outcome: Progressing   

## 2022-03-13 NOTE — Evaluation (Signed)
Physical Therapy Evaluation Patient Details Name: [name] MRN: [id number] DOB: [birth year] Today's Date: 03/13/2022  History of Present Illness  [name] is a 76 y.o. female with PMH significant for HTN, HLD, CAD, bilateral lower extremity lymphedema, bladder cancer, diverticulosis, GERD, fibromyalgia, left breast cancer status post chemo/radiation  11/19, patient was brought to the ED with complaint of progressively worsening dysphagia/odynophagia in the last few weeks causing poor oral intake, hydration and weakness. Patient admitted for [name] is a 76 y.o. female with PMH significant for HTN, HLD, CAD, bilateral lower extremity lymphedema, bladder cancer, diverticulosis, GERD, fibromyalgia, left breast cancer status post chemo/radiation  11/19, patient was brought to the ED with complaint of progressively worsening dysphagia/odynophagia in the last few weeks causing poor oral intake, hydration and weakness.  Clinical Impression  Mrs. [name] is a 76 year old woman who presents with generalized weakness, decreased activity tolerance and impaired balance. Patient needs min assist for standing and only able to take steps to recliner due to complaints of dizziness. Her BP was [facility] however. Patient will benefit from skilled PT services while in hospital to maximize IND and safety for return  home with family assist.  Pt reports she had just started HHPT prior to admit and would benefit from continued HHPT services.      Recommendations for follow up therapy are one component of a multi-disciplinary discharge planning process, led by the attending physician.  Recommendations may be updated based on patient status, additional functional criteria and insurance authorization.  Follow Up Recommendations Home health PT (Pt reports HHPT had just started prior to admit)      Assistance Recommended at Discharge Frequent or constant Supervision/Assistance  Patient can  return home with the following  A lot of help with walking and/or transfers;A little help with bathing/dressing/bathroom;Assistance with cooking/housework;Assist for transportation;Help with stairs or ramp for entrance    Equipment Recommendations None recommended by PT  Recommendations for Other Services       Functional Status Assessment Patient has had a recent decline in their functional status and demonstrates the ability to make significant improvements in function in a reasonable and predictable amount of time.     Precautions / Restrictions Precautions Precautions: Fall Restrictions Weight Bearing Restrictions: No      Mobility  Bed Mobility Overal bed mobility: Needs Assistance Bed Mobility: Supine to Sit     Supine to sit: Min assist, HOB elevated     General bed mobility comments: Increased time with assist to bring trunk to upright and complete rotation to EOB sitting    Transfers Overall transfer level: Needs assistance Equipment used: Rolling walker (2 wheels) Transfers: Sit to/from Stand, Bed to chair/wheelchair/BSC Sit to Stand: Min assist, +2 safety/equipment   Step pivot transfers: Min assist, +2 safety/equipment       General transfer comment: Min assist to stand and take steps to recliner. Patient dizzy and weak with standing so unable to ambulate.    Ambulation/Gait               General Gait Details: step pvt bed to chair only 2* fatigue/dizziness  Stairs            Wheelchair Mobility    Modified Rankin (Stroke Patients Only)       Balance Overall balance assessment: Needs assistance Sitting-balance support: No upper extremity supported, Feet supported Sitting balance-Leahy Scale: Fair     Standing balance support: During functional activity, Reliant on assistive device  for balance Standing balance-Leahy Scale: Poor                               Pertinent Vitals/Pain Pain Assessment Pain Assessment:  No/denies pain    Home Living Family/patient expects to be discharged to:: Private residence Living Arrangements: Other relatives (sisters) Available Help at Discharge: Family;Available 24 hours/day Type of Home: House Home Access: Stairs to enter Entrance Stairs-Rails: Right Entrance Stairs-Number of Steps: 2   Home Layout: One level Home Equipment: Conservation officer, nature (2 wheels);BSC/3in1;Shower seat      Prior Function Prior Level of Function : Needs assist             Mobility Comments: uses a walker, been staying at her sister's house, has help for transfers ADLs Comments: Getting Palliative Care, has help for bathing once a week, needs clothes laid out and assistance with fasteners, help to transfer to [place] Hand: Right    Extremity/Trunk Assessment   Upper Extremity Assessment Upper Extremity Assessment: Generalized weakness    Lower Extremity Assessment Lower Extremity Assessment: Generalized weakness    Cervical / Trunk Assessment Cervical / Trunk Assessment: Normal  Communication   Communication: No difficulties  Cognition Arousal/Alertness: Awake/alert Behavior During Therapy: WFL for tasks assessed/performed Overall Cognitive Status: Within Functional Limits for tasks assessed                                 General Comments: Appears to have some short term memory deficits but overall functional cognition. Patient's sister present and reports on PLOF that patient got wrong        General Comments      Exercises     Assessment/Plan    PT Assessment Patient needs continued PT services  PT Problem List Decreased strength;Decreased activity tolerance;Decreased balance;Decreased mobility;Decreased knowledge of use of DME       PT Treatment Interventions DME instruction;Gait training;Stair training;Functional mobility training;Therapeutic activities;Therapeutic exercise;Patient/family education    PT Goals  (Current goals can be found in the Care Plan section)  Acute Rehab PT Goals Patient Stated Goal: Regain IND PT Goal Formulation: With patient Time For Goal Achievement: 03/27/22 Potential to Achieve Goals: Fair    Frequency Min 3X/week     Co-evaluation PT/OT/SLP Co-Evaluation/Treatment: Yes Reason for Co-Treatment: For patient/therapist safety;To address functional/ADL transfers PT goals addressed during session: Mobility/safety with mobility OT goals addressed during session: ADL's and self-care       AM-PAC PT "6 Clicks" Mobility  Outcome Measure Help needed turning from your back to your side while in a flat bed without using bedrails?: A Little Help needed moving from lying on your back to sitting on the side of a flat bed without using bedrails?: A Little Help needed moving to and from a bed to a chair (including a wheelchair)?: A Little Help needed standing up from a chair using your arms (e.g., wheelchair or bedside chair)?: A Little Help needed to walk in hospital room?: Total Help needed climbing 3-5 steps with a railing? : Total 6 Click Score: 14    End of Session Equipment Utilized During Treatment: Gait belt Activity Tolerance: Patient limited by fatigue Patient left: in chair;with call bell/phone within reach;with chair alarm set;with family/visitor present Nurse Communication: Mobility status PT Visit Diagnosis: Unsteadiness on feet (R26.81);Muscle weakness (generalized) (M62.81);Difficulty in walking, not  elsewhere classified (R26.2)    Time: 5681-2751 PT Time Calculation (min) (ACUTE ONLY): 24 min   Charges:   PT Evaluation $PT Eval Low Complexity: [place] [facility] Services Pager [phone] Office [phone]   Sarra Rachels 03/13/2022, 7:00 PM

## 2022-03-13 NOTE — Anesthesia Postprocedure Evaluation (Signed)
Anesthesia Post Note  Patient: [name]  Procedure(s) Performed: ESOPHAGOGASTRODUODENOSCOPY (EGD) WITH PROPOFOL     Patient location during evaluation: PACU Anesthesia Type: MAC Level of consciousness: awake and alert Pain management: pain level controlled Vital Signs Assessment: post-procedure vital signs reviewed and stable Respiratory status: spontaneous breathing Cardiovascular status: stable Anesthetic complications: no   No notable events documented.  Last Vitals:  Vitals:   03/12/22 2037 03/13/22 0610  BP: 125/66 123/74  Pulse: (!) 103 (!) 128  Resp: 20 20  Temp: 36.8 C 36.7 C  SpO2: 92% 100%    Last Pain:  Vitals:   03/13/22 0645  TempSrc:   PainSc: [place]

## 2022-03-14 DIAGNOSIS — C7951 Secondary malignant neoplasm of bone: Secondary | ICD-10-CM | POA: Diagnosis not present

## 2022-03-14 DIAGNOSIS — E860 Dehydration: Secondary | ICD-10-CM | POA: Diagnosis not present

## 2022-03-14 DIAGNOSIS — R1310 Dysphagia, unspecified: Secondary | ICD-10-CM | POA: Diagnosis not present

## 2022-03-14 DIAGNOSIS — D696 Thrombocytopenia, unspecified: Secondary | ICD-10-CM | POA: Diagnosis not present

## 2022-03-14 LAB — CBC WITH DIFFERENTIAL/PLATELET
Abs Immature Granulocytes: 0.06 10*3/uL (ref 0.00–0.07)
Basophils Absolute: 0 10*3/uL (ref 0.0–0.1)
Basophils Relative: 1 %
Eosinophils Absolute: 0 10*3/uL (ref 0.0–0.5)
Eosinophils Relative: 1 %
HCT: 34.8 % — ABNORMAL LOW (ref 36.0–46.0)
Hemoglobin: 10.8 g/dL — ABNORMAL LOW (ref 12.0–15.0)
Immature Granulocytes: 2 %
Lymphocytes Relative: 15 %
Lymphs Abs: 0.5 10*3/uL — ABNORMAL LOW (ref 0.7–4.0)
MCH: 26 pg (ref 26.0–34.0)
MCHC: 31 g/dL (ref 30.0–36.0)
MCV: 83.7 fL (ref 80.0–100.0)
Monocytes Absolute: 0.3 10*3/uL (ref 0.1–1.0)
Monocytes Relative: 10 %
Neutro Abs: 2.3 10*3/uL (ref 1.7–7.7)
Neutrophils Relative %: 71 %
Platelets: 115 10*3/uL — ABNORMAL LOW (ref 150–400)
RBC: 4.16 MIL/uL (ref 3.87–5.11)
RDW: 14.6 % (ref 11.5–15.5)
WBC: 3.2 10*3/uL — ABNORMAL LOW (ref 4.0–10.5)
nRBC: 0 % (ref 0.0–0.2)

## 2022-03-14 LAB — BASIC METABOLIC PANEL
Anion gap: 4 — ABNORMAL LOW (ref 5–15)
BUN: 8 mg/dL (ref 8–23)
CO2: 26 mmol/L (ref 22–32)
Calcium: 8.9 mg/dL (ref 8.9–10.3)
Chloride: 106 mmol/L (ref 98–111)
Creatinine, Ser: 0.44 mg/dL (ref 0.44–1.00)
GFR, Estimated: >60 mL/min (ref >60)
Glucose, Bld: 120 mg/dL — ABNORMAL HIGH (ref 70–99)
Potassium: 3.8 mmol/L (ref 3.5–5.1)
Sodium: 136 mmol/L (ref 135–145)

## 2022-03-14 MED ORDER — GUAIFENESIN 100 MG/5ML PO LIQD
5.0000 mL | ORAL | Status: DC | PRN
  Administered 2022-03-14 – 2022-03-15 (×2): 5 mL via ORAL
  Filled 2022-03-14 (×2): qty 10

## 2022-03-14 MED ORDER — MAGIC MOUTHWASH W/LIDOCAINE
10.0000 mL | Freq: Four times a day (QID) | ORAL | Status: DC
  Administered 2022-03-14 – 2022-03-16 (×9): 10 mL via ORAL
  Filled 2022-03-14 (×11): qty 10

## 2022-03-14 NOTE — Progress Notes (Signed)
PROGRESS NOTE    [name]  KGU:[id number] DOB: 08-11-1945 DOA: 03/10/2022 PCP: [place], Betty G, MD    Chief Complaint  Patient presents with   Flank Pain    Brief Narrative:  [name] is a 76 y.o. female with PMH significant for HTN, HLD, CAD, bilateral lower extremity lymphedema, bladder cancer, diverticulosis, GERD, fibromyalgia, left breast cancer status post chemo/radiation 11/19, patient was brought to the ED with complaint of progressively worsening dysphagia/odynophagia in the last few weeks causing poor oral intake, hydration and weakness.   In the ED, patient was afebrile, heart rate in 60s, blood pressure 150/67, breathing on room air. Labs mostly unremarkable, hemoglobin 12.6. Urinalysis with clear yellow urine with trace leukocytes CT head unremarkable Chest x-ray with low volume chest miliary nodularity known from recent chest CT.   Admitted to [facility]   Assessment & Plan:   Principal Problem:   Odynophagia Active Problems:   Mixed hyperlipidemia   HTN (hypertension)   GERD (gastroesophageal reflux disease)   CAD (coronary artery disease)   Malignant neoplasm of left female breast (HCC)   Diastolic dysfunction without heart failure   Prediabetes   Metastatic cancer to bone (HCC)   Leukopenia   Thrombocytopenia (HCC)   Mild protein malnutrition (HCC)   Dehydration   Radiation-induced esophagitis   Generalized weakness  #1 odynophagia/dysphagia secondary to LA grade D radiation esophagitis -Patient presenting with odynophagia/dysphagia, history of radiation for left breast cancer recently completed radiation. -Patient noted to have had worsening odynophagia/dysphagia with poor oral intake/hydration leading to lethargy. -Status post trial of fluconazole, Biotene without any significant improvement. -On IV PPI, Magic mouthwash with lidocaine 4 times daily with some improvement. -Patient seen by GI underwent upper endoscopy 03/12/2022 which  showed LA grade D radiation esophagitis without any bleeding. -Patient seen by GI recommending continuation of PPI, Magic mouthwash with lidocaine, Carafate. -Outpatient follow-up with GI, Dr. Collene Mares upon discharge.  2.  Chronic diastolic CHF/hypertension -Patient noted to be volume depleted on exam secondary to poor oral intake. -Patient noted to be on losartan 100 mg daily, Lasix 20 mg daily prior to admission. -BP currently stable off antihypertensive medications. -Patient started on low-dose beta-blocker due to tachycardia which we will continue. -Supportive care.  3.  CAD/hyperlipidemia -Aspirin currently on hold prior to EGD.  -Will defer to GI when aspirin may be resumed. -Continue statin.  4.  GERD -Continue PPI daily.   5.  Left breast cancer with bone mets -Patient status post chemoradiation. -Continue current pain meds as needed. -Patient follow-up with oncology.  6.  Leukopenia/thrombocytopenia -In the setting of chemo/radiotherapy. -Patient with no overt signs of bleeding or infection. -Leukopenia slowly improving as well as platelet count.   7.  Mild protein malnutrition -Nutritional supplementation.   DVT prophylaxis:scd Code Status: Full Family Communication: Updated nephew at bedside and patient.  Disposition: Likely home with home health hopefully in the next 24 hours.  Status is: Inpatient Remains inpatient appropriate because: Severity of illness.   Consultants:  Gastroenterology: Dr. Benson Norway 03/11/2022  Procedures: CT head 03/10/2022 Chest x-ray 03/10/2022 Upper endoscopy per Dr. Benson Norway 03/12/2022  Antimicrobials:  None   Subjective: Laying in bed.  Complains of significant weakness and not feeling well today.  Poor oral intake.  Swallowing slowly improving however still with pain.  No chest pain.  No shortness of breath.  No abdominal pain.  Nephew at bedside.    Objective: Vitals:   03/13/22 1306 03/13/22 2009 03/14/22 0401 03/14/22 1214  BP:  137/69 (!) 116/55 130/71 128/61  Pulse: 100 (!) 101 (!) 102 99  Resp: 18 18 16  (!) 22  Temp: 98.6 F (37 C) 98.3 F (36.8 C) 99.6 F (37.6 C) 98.6 F (37 C)  TempSrc: Oral Oral Oral Oral  SpO2: 99% 98% 98% 99%  Weight:      Height:        Intake/Output Summary (Last 24 hours) at 03/14/2022 1304 Last data filed at 03/14/2022 0846 Gross per 24 hour  Intake --  Output 700 ml  Net -700 ml    Filed Weights   03/10/22 0431 03/10/22 0532  Weight: 85.2 kg 84.8 kg    Examination:  General exam: NAD Respiratory system: CTA B.  No wheezes, no crackles, no rhonchi.  Fair air movement.  Speaking in full sentences.  Cardiovascular system: Regular rate rhythm no murmurs rubs or gallops.  No JVD.  No lower extremity edema.  Gastrointestinal system: Abdomen is soft, nontender, nondistended, positive bowel sounds.  No rebound.  No guarding.  Central nervous system: Alert and oriented. No focal neurological deficits. Extremities: Symmetric 5 x 5 power. Skin: No rashes, lesions or ulcers Psychiatry: Judgement and insight appear normal. Mood & affect appropriate.     Data Reviewed: I have personally reviewed following labs and imaging studies  CBC: Recent Labs  Lab 03/10/22 0728 03/11/22 0254 03/12/22 0402 03/13/22 0332 03/14/22 0347  WBC 3.0* 2.5* 2.5* 2.9* 3.2*  NEUTROABS 2.5  --  1.9 2.3 2.3  HGB 12.6 11.7* 11.4* 11.1* 10.8*  HCT 41.7 37.9 36.3 35.5* 34.8*  MCV 85.6 85.6 84.2 83.3 83.7  PLT 123* 102* 100* 106* 115*     Basic Metabolic Panel: Recent Labs  Lab 03/10/22 0728 03/11/22 0254 03/12/22 0402 03/13/22 0332 03/14/22 0347  NA 133* 136 132* 138 136  K 4.5 4.4 4.1 3.8 3.8  CL 103 105 104 108 106  CO2 24 25 23 24 26   GLUCOSE 114* 94 102* 109* 120*  BUN 11 8 7* 6* 8  CREATININE 0.59 0.45 0.41* 0.34* 0.44  CALCIUM 9.1 8.7* 8.5* 8.4* 8.9     GFR: Estimated Creatinine Clearance: 68.3 mL/min (by C-G formula based on SCr of 0.44 mg/dL).  Liver Function  Tests: Recent Labs  Lab 03/10/22 0728 03/11/22 0254  AST 22 27  ALT 14 19  ALKPHOS 96 83  BILITOT 0.7 0.6  PROT 6.3* 5.6*  ALBUMIN 3.3* 2.9*     CBG: No results for input(s): "GLUCAP" in the last 168 hours.   No results found for this or any previous visit (from the past 240 hour(s)).       Radiology Studies: No results found.      Scheduled Meds:  Chlorhexidine Gluconate Cloth  6 each Topical Daily   cyanocobalamin  1,000 mcg Oral Daily   feeding supplement  237 mL Oral BID BM   loratadine  10 mg Oral Daily   magic mouthwash w/lidocaine  10 mL Oral QID   metoprolol tartrate  12.5 mg Oral BID   multivitamin with minerals  1 tablet Oral Daily   pantoprazole  40 mg Oral Daily   polyethylene glycol  17 g Oral Daily   sucralfate  1 g Oral TID WC & HS   Continuous Infusions:   LOS: 3 days    Time spent: 35 minutes    Irine Seal, MD [facility]   To contact the attending provider between 7A-7P or the covering provider during after hours 7P-7A,  please log into the web site [email or web address] and access using universal [place] password for that web site. If you do not have the password, please call the hospital operator.  03/14/2022, 1:04 PM

## 2022-03-15 ENCOUNTER — Encounter (HOSPITAL_COMMUNITY): Payer: Self-pay | Admitting: Gastroenterology

## 2022-03-15 DIAGNOSIS — D696 Thrombocytopenia, unspecified: Secondary | ICD-10-CM | POA: Diagnosis not present

## 2022-03-15 DIAGNOSIS — R1310 Dysphagia, unspecified: Secondary | ICD-10-CM | POA: Diagnosis not present

## 2022-03-15 DIAGNOSIS — E860 Dehydration: Secondary | ICD-10-CM | POA: Diagnosis not present

## 2022-03-15 DIAGNOSIS — C7951 Secondary malignant neoplasm of bone: Secondary | ICD-10-CM | POA: Diagnosis not present

## 2022-03-15 LAB — MAGNESIUM: Magnesium: 1.9 mg/dL (ref 1.7–2.4)

## 2022-03-15 LAB — RENAL FUNCTION PANEL
Albumin: 2.8 g/dL — ABNORMAL LOW (ref 3.5–5.0)
Anion gap: 5 (ref 5–15)
BUN: 7 mg/dL — ABNORMAL LOW (ref 8–23)
CO2: 25 mmol/L (ref 22–32)
Calcium: 8.9 mg/dL (ref 8.9–10.3)
Chloride: 106 mmol/L (ref 98–111)
Creatinine, Ser: 0.42 mg/dL — ABNORMAL LOW (ref 0.44–1.00)
GFR, Estimated: >60 mL/min (ref >60)
Glucose, Bld: 113 mg/dL — ABNORMAL HIGH (ref 70–99)
Phosphorus: 1 mg/dL — CL (ref 2.5–4.6)
Potassium: 3.9 mmol/L (ref 3.5–5.1)
Sodium: 136 mmol/L (ref 135–145)

## 2022-03-15 LAB — CBC
HCT: 35.6 % — ABNORMAL LOW (ref 36.0–46.0)
Hemoglobin: 10.9 g/dL — ABNORMAL LOW (ref 12.0–15.0)
MCH: 25.6 pg — ABNORMAL LOW (ref 26.0–34.0)
MCHC: 30.6 g/dL (ref 30.0–36.0)
MCV: 83.6 fL (ref 80.0–100.0)
Platelets: 122 10*3/uL — ABNORMAL LOW (ref 150–400)
RBC: 4.26 MIL/uL (ref 3.87–5.11)
RDW: 14.5 % (ref 11.5–15.5)
WBC: 3.5 10*3/uL — ABNORMAL LOW (ref 4.0–10.5)
nRBC: 0 % (ref 0.0–0.2)

## 2022-03-15 MED ORDER — OXYCODONE HCL 5 MG PO TABS
15.0000 mg | ORAL_TABLET | Freq: Four times a day (QID) | ORAL | Status: DC | PRN
  Administered 2022-03-15 – 2022-03-16 (×4): 15 mg via ORAL
  Filled 2022-03-15 (×5): qty 3

## 2022-03-15 MED ORDER — POTASSIUM PHOSPHATES 15 MMOLE/5ML IV SOLN
30.0000 mmol | Freq: Once | INTRAVENOUS | Status: AC
  Administered 2022-03-15: 30 mmol via INTRAVENOUS
  Filled 2022-03-15: qty 10

## 2022-03-15 MED ORDER — POTASSIUM PHOSPHATES 15 MMOLE/5ML IV SOLN
15.0000 mmol | Freq: Once | INTRAVENOUS | Status: DC
  Administered 2022-03-15: 15 mmol via INTRAVENOUS
  Filled 2022-03-15: qty 5

## 2022-03-15 NOTE — Progress Notes (Signed)
Chaplain attempted to visit [name] and her family to provide support. [name] was with physical therapy at the time.  Chaplain will attempt follow up on Monday, but please page if needs arise before then.  [address], [place] Pager, [phone]

## 2022-03-15 NOTE — Progress Notes (Signed)
PROGRESS NOTE    [name]  GYI:[id number] DOB: October 18, 1945 DOA: 03/10/2022 PCP: [place], Betty G, MD    Chief Complaint  Patient presents with   Flank Pain    Brief Narrative:  [name] is a 76 y.o. female with PMH significant for HTN, HLD, CAD, bilateral lower extremity lymphedema, bladder cancer, diverticulosis, GERD, fibromyalgia, left breast cancer status post chemo/radiation 11/19, patient was brought to the ED with complaint of progressively worsening dysphagia/odynophagia in the last few weeks causing poor oral intake, hydration and weakness.   In the ED, patient was afebrile, heart rate in 60s, blood pressure 150/67, breathing on room air. Labs mostly unremarkable, hemoglobin 12.6. Urinalysis with clear yellow urine with trace leukocytes CT head unremarkable Chest x-ray with low volume chest miliary nodularity known from recent chest CT.   Admitted to [facility]   Assessment & Plan:   Principal Problem:   Odynophagia Active Problems:   Mixed hyperlipidemia   HTN (hypertension)   GERD (gastroesophageal reflux disease)   CAD (coronary artery disease)   Malignant neoplasm of left female breast (HCC)   Diastolic dysfunction without heart failure   Prediabetes   Metastatic cancer to bone (HCC)   Leukopenia   Thrombocytopenia (HCC)   Mild protein malnutrition (HCC)   Dehydration   Radiation-induced esophagitis   Generalized weakness   Hypophosphatemia  #1 odynophagia/dysphagia secondary to LA grade D radiation esophagitis -Patient presenting with odynophagia/dysphagia, history of radiation for left breast cancer recently completed radiation. -Patient noted to have had worsening odynophagia/dysphagia with poor oral intake/hydration leading to lethargy. -Status post trial of fluconazole, Biotene without any significant improvement. -On PPI, Magic mouthwash with lidocaine 4 times daily with some improvement. -Patient seen by GI underwent upper endoscopy  03/12/2022 which showed LA grade D radiation esophagitis without any bleeding. -Patient seen by GI recommending continuation of PPI, Magic mouthwash with lidocaine, Carafate. -Outpatient follow-up with GI, Dr. Collene Mares upon discharge.  2.  Chronic diastolic CHF/hypertension -Patient noted to be volume depleted on exam secondary to poor oral intake. -Patient noted to be on losartan 100 mg daily, Lasix 20 mg daily prior to admission. -BP currently stable off antihypertensive medications. -Patient started on low-dose beta-blocker due to tachycardia which we will continue. -Supportive care.  3.  CAD/hyperlipidemia -Aspirin currently on hold prior to EGD.  -Will defer to GI when aspirin may be resumed. -Continue statin.  4.  GERD -PPI.   5.  Left breast cancer with bone mets -Patient status post chemoradiation. -Continue current pain meds as needed. -Patient follow-up with oncology.  6.  Leukopenia/thrombocytopenia -In the setting of chemo/radiotherapy. -Patient with no overt signs of bleeding or infection. -Leukopenia slowly improving as well as platelet count.   7.  Mild protein malnutrition -Nutritional supplementation.  8.  Hypophosphatemia -Phosphorus level at 1.0. -K-Phos 30 mmol IV x 1. -Repeat labs in the AM.   DVT prophylaxis:scd Code Status: Full Family Communication: Updated family at bedside. Disposition: Likely home with home health hopefully in the next 24 hours.  Status is: Inpatient Remains inpatient appropriate because: Severity of illness.   Consultants:  Gastroenterology: Dr. Benson Norway 03/11/2022  Procedures: CT head 03/10/2022 Chest x-ray 03/10/2022 Upper endoscopy per Dr. Benson Norway 03/12/2022  Antimicrobials:  None   Subjective: Patient laying in bed with emesis bag.  Complaining of nausea.  States not feeling too well.  Complain of generalized weakness.  Still with somewhat dyne aphasia however slowly improving.  Poor oral intake.  Family at bedside.  Objective: Vitals:   03/15/22 0410 03/15/22 0946 03/15/22 1051 03/15/22 1252  BP: 125/76 120/70  138/72  Pulse: (!) 107 (!) 102  99  Resp: 16  20 18   Temp: 99.1 F (37.3 C)   98.7 F (37.1 C)  TempSrc: Oral   Oral  SpO2: 100%   100%  Weight:      Height:        Intake/Output Summary (Last 24 hours) at 03/15/2022 1314 Last data filed at 03/15/2022 0605 Gross per 24 hour  Intake 120 ml  Output 550 ml  Net -430 ml    Filed Weights   03/10/22 0431 03/10/22 0532  Weight: 85.2 kg 84.8 kg    Examination:  General exam: NAD. Respiratory system: Lungs clear to auscultation bilaterally.  No wheezes, no crackles, no rhonchi.  Fair air movement.  Speaking in full sentences.   Cardiovascular system: RRR no murmurs rubs or gallops.  No JVD.  No lower extremity edema.  Gastrointestinal system: Abdomen is soft, nontender, nondistended, positive bowel sounds.  No rebound.  No guarding.  Central nervous system: Alert and oriented. No focal neurological deficits. Extremities: Symmetric 5 x 5 power. Skin: No rashes, lesions or ulcers Psychiatry: Judgement and insight appear normal. Mood & affect appropriate.     Data Reviewed: I have personally reviewed following labs and imaging studies  CBC: Recent Labs  Lab 03/10/22 0728 03/11/22 0254 03/12/22 0402 03/13/22 0332 03/14/22 0347 03/15/22 0324  WBC 3.0* 2.5* 2.5* 2.9* 3.2* 3.5*  NEUTROABS 2.5  --  1.9 2.3 2.3  --   HGB 12.6 11.7* 11.4* 11.1* 10.8* 10.9*  HCT 41.7 37.9 36.3 35.5* 34.8* 35.6*  MCV 85.6 85.6 84.2 83.3 83.7 83.6  PLT 123* 102* 100* 106* 115* 122*     Basic Metabolic Panel: Recent Labs  Lab 03/11/22 0254 03/12/22 0402 03/13/22 0332 03/14/22 0347 03/15/22 0324  NA 136 132* 138 136 136  K 4.4 4.1 3.8 3.8 3.9  CL 105 104 108 106 106  CO2 25 23 24 26 25   GLUCOSE 94 102* 109* 120* 113*  BUN 8 7* 6* 8 7*  CREATININE 0.45 0.41* 0.34* 0.44 0.42*  CALCIUM 8.7* 8.5* 8.4* 8.9 8.9  MG  --   --   --   --   1.9  PHOS  --   --   --   --  1.0*     GFR: Estimated Creatinine Clearance: 68.3 mL/min (A) (by C-G formula based on SCr of 0.42 mg/dL (L)).  Liver Function Tests: Recent Labs  Lab 03/10/22 0728 03/11/22 0254 03/15/22 0324  AST 22 27  --   ALT 14 19  --   ALKPHOS 96 83  --   BILITOT 0.7 0.6  --   PROT 6.3* 5.6*  --   ALBUMIN 3.3* 2.9* 2.8*     CBG: No results for input(s): "GLUCAP" in the last 168 hours.   No results found for this or any previous visit (from the past 240 hour(s)).       Radiology Studies: No results found.      Scheduled Meds:  Chlorhexidine Gluconate Cloth  6 each Topical Daily   cyanocobalamin  1,000 mcg Oral Daily   feeding supplement  237 mL Oral BID BM   loratadine  10 mg Oral Daily   magic mouthwash w/lidocaine  10 mL Oral QID   metoprolol tartrate  12.5 mg Oral BID   multivitamin with minerals  1 tablet Oral Daily  pantoprazole  40 mg Oral Daily   polyethylene glycol  17 g Oral Daily   sucralfate  1 g Oral TID WC & HS   Continuous Infusions:  potassium PHOSPHATE IVPB (in mmol) 30 mmol (03/15/22 0944)     LOS: 4 days    Time spent: 35 minutes    Irine Seal, MD [facility]   To contact the attending provider between 7A-7P or the covering provider during after hours 7P-7A, please log into the web site [email or web address] and access using universal [place] password for that web site. If you do not have the password, please call the hospital operator.  03/15/2022, 1:14 PM

## 2022-03-15 NOTE — Progress Notes (Signed)
Physical Therapy Treatment Patient Details Name: [name] MRN: [id number] DOB: 1945-07-22 Today's Date: 03/15/2022   History of Present Illness [name] is a 76 y.o. female with PMH significant for HTN, HLD, CAD, bilateral lower extremity lymphedema, bladder cancer, diverticulosis, GERD, fibromyalgia, left breast cancer status post chemo/radiation  11/19, patient was brought to the ED with complaint of progressively worsening dysphagia/odynophagia in the last few weeks causing poor oral intake, hydration and weakness. Patient admitted for [name] [name] is a 76 y.o. female with PMH significant for HTN, HLD, CAD, bilateral lower extremity lymphedema, bladder cancer, diverticulosis, GERD, fibromyalgia, left breast cancer status post chemo/radiation  11/19, patient was brought to the ED with complaint of progressively worsening dysphagia/odynophagia in the last few weeks causing poor oral intake, hydration and weakness.    PT Comments    Pt assisted with standing and able to ambulate short distance of 19 feet with RW.  Pt requiring min assist due to weakness.  Pt assisted back to bed (finds recliner chair uncomfortable) and pillows placed to comfort.     Recommendations for follow up therapy are one component of a multi-disciplinary discharge planning process, led by the attending physician.  Recommendations may be updated based on patient status, additional functional criteria and insurance authorization.  Follow Up Recommendations  Home health PT     Assistance Recommended at Discharge Frequent or constant Supervision/Assistance  Patient can return home with the following A little help with bathing/dressing/bathroom;Assistance with cooking/housework;Assist for transportation;Help with stairs or ramp for entrance;A little help with walking and/or transfers   Equipment Recommendations  None recommended by PT    Recommendations for Other Services       Precautions /  Restrictions Precautions Precautions: Fall     Mobility  Bed Mobility Overal bed mobility: Needs Assistance Bed Mobility: Supine to Sit, Sit to Supine     Supine to sit: Min assist Sit to supine: Min assist   General bed mobility comments: light assist for trunk upright and LEs onto bed    Transfers Overall transfer level: Needs assistance Equipment used: Rolling walker (2 wheels) Transfers: Sit to/from Stand Sit to Stand: Min assist           General transfer comment: light assist to rise and steady, cues for hand placement    Ambulation/Gait Ambulation/Gait assistance: Min assist Gait Distance (Feet): 19 Feet Assistive device: Rolling walker (2 wheels) Gait Pattern/deviations: Step-through pattern, Decreased stride length, Trunk flexed       General Gait Details: verbal cues for RW positioning and posture, limited by fatigue, assist for weakness   Stairs             Wheelchair Mobility    Modified Rankin (Stroke Patients Only)       Balance Overall balance assessment: Needs assistance         Standing balance support: During functional activity, Reliant on assistive device for balance Standing balance-Leahy Scale: Poor                              Cognition Arousal/Alertness: Awake/alert Behavior During Therapy: WFL for tasks assessed/performed Overall Cognitive Status: Within Functional Limits for tasks assessed                                 General Comments: little verbalizations, family speaking for pt  Exercises      General Comments        Pertinent Vitals/Pain Pain Assessment Pain Assessment: Faces Faces Pain Scale: Hurts little more Pain Location: generalized Pain Intervention(s): Repositioned, Monitored during session    Home Living                          Prior Function            PT Goals (current goals can now be found in the care plan section) Progress towards PT  goals: Progressing toward goals    Frequency    Min 3X/week      PT Plan Current plan remains appropriate    Co-evaluation              AM-PAC PT "6 Clicks" Mobility   Outcome Measure  Help needed turning from your back to your side while in a flat bed without using bedrails?: A Little Help needed moving from lying on your back to sitting on the side of a flat bed without using bedrails?: A Little Help needed moving to and from a bed to a chair (including a wheelchair)?: A Little Help needed standing up from a chair using your arms (e.g., wheelchair or bedside chair)?: A Little Help needed to walk in hospital room?: A Lot Help needed climbing 3-5 steps with a railing? : A Lot 6 Click Score: 16    End of Session Equipment Utilized During Treatment: Gait belt Activity Tolerance: Patient limited by fatigue Patient left: in bed;with call bell/phone within reach;with bed alarm set;with family/visitor present   PT Visit Diagnosis: Muscle weakness (generalized) (M62.81);Difficulty in walking, not elsewhere classified (R26.2)     Time: 2542-7062 PT Time Calculation (min) (ACUTE ONLY): 13 min  Charges:  $Gait Training: 8-22 mins                    Jannette Spanner PT, DPT Physical Therapist Acute Rehabilitation Services Preferred contact method: Secure Chat Weekend Pager Only: [phone] Office: [place] 03/15/2022, 4:32 PM

## 2022-03-15 NOTE — TOC Progression Note (Addendum)
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   Expected Discharge Plan: Home/Self Care Barriers to Discharge: Continued Medical Work up  Expected Discharge Plan and Services Expected Discharge Plan: Home/Self Care   Discharge Planning Services: CM Consult   Living arrangements for the past 2 months: Single Family Home                                       Social Determinants of Health (SDOH) Interventions    Readmission Risk Interventions    02/22/2022   12:08 PM 02/18/2022    3:51 PM  Readmission Risk Prevention Plan  Transportation Screening Complete Complete  PCP or Specialist Appt within 5-7 Days Complete Complete  Home Care Screening Complete Complete  Medication Review (RN CM) Complete Complete

## 2022-03-16 DIAGNOSIS — I10 Essential (primary) hypertension: Secondary | ICD-10-CM

## 2022-03-16 DIAGNOSIS — E876 Hypokalemia: Secondary | ICD-10-CM

## 2022-03-16 DIAGNOSIS — R627 Adult failure to thrive: Secondary | ICD-10-CM | POA: Diagnosis not present

## 2022-03-16 DIAGNOSIS — R1310 Dysphagia, unspecified: Secondary | ICD-10-CM | POA: Diagnosis not present

## 2022-03-16 DIAGNOSIS — C7951 Secondary malignant neoplasm of bone: Secondary | ICD-10-CM | POA: Diagnosis not present

## 2022-03-16 DIAGNOSIS — K209 Esophagitis, unspecified without bleeding: Secondary | ICD-10-CM

## 2022-03-16 LAB — BASIC METABOLIC PANEL
Anion gap: 4 — ABNORMAL LOW (ref 5–15)
BUN: 7 mg/dL — ABNORMAL LOW (ref 8–23)
CO2: 28 mmol/L (ref 22–32)
Calcium: 9 mg/dL (ref 8.9–10.3)
Chloride: 103 mmol/L (ref 98–111)
Creatinine, Ser: 0.51 mg/dL (ref 0.44–1.00)
GFR, Estimated: >60 mL/min (ref >60)
Glucose, Bld: 117 mg/dL — ABNORMAL HIGH (ref 70–99)
Potassium: 3.9 mmol/L (ref 3.5–5.1)
Sodium: 135 mmol/L (ref 135–145)

## 2022-03-16 LAB — CBC WITH DIFFERENTIAL/PLATELET
Abs Immature Granulocytes: 0.04 10*3/uL (ref 0.00–0.07)
Basophils Absolute: 0 10*3/uL (ref 0.0–0.1)
Basophils Relative: 1 %
Eosinophils Absolute: 0 10*3/uL (ref 0.0–0.5)
Eosinophils Relative: 1 %
HCT: 34.8 % — ABNORMAL LOW (ref 36.0–46.0)
Hemoglobin: 10.8 g/dL — ABNORMAL LOW (ref 12.0–15.0)
Immature Granulocytes: 1 %
Lymphocytes Relative: 24 %
Lymphs Abs: 0.8 10*3/uL (ref 0.7–4.0)
MCH: 25.8 pg — ABNORMAL LOW (ref 26.0–34.0)
MCHC: 31 g/dL (ref 30.0–36.0)
MCV: 83.3 fL (ref 80.0–100.0)
Monocytes Absolute: 0.3 10*3/uL (ref 0.1–1.0)
Monocytes Relative: 10 %
Neutro Abs: 2.1 10*3/uL (ref 1.7–7.7)
Neutrophils Relative %: 63 %
Platelets: 125 10*3/uL — ABNORMAL LOW (ref 150–400)
RBC: 4.18 MIL/uL (ref 3.87–5.11)
RDW: 14.5 % (ref 11.5–15.5)
WBC: 3.4 10*3/uL — ABNORMAL LOW (ref 4.0–10.5)
nRBC: 0 % (ref 0.0–0.2)

## 2022-03-16 LAB — MAGNESIUM: Magnesium: 1.9 mg/dL (ref 1.7–2.4)

## 2022-03-16 LAB — PHOSPHORUS: Phosphorus: 1.3 mg/dL — ABNORMAL LOW (ref 2.5–4.6)

## 2022-03-16 MED ORDER — SUCRALFATE 1 G PO TABS: 1.0000 g | ORAL_TABLET | Freq: Three times a day (TID) | ORAL | 1 refills | Status: AC

## 2022-03-16 MED ORDER — ADULT MULTIVITAMIN W/MINERALS CH: 1.0000 | ORAL_TABLET | Freq: Every day | ORAL | Status: DC

## 2022-03-16 MED ORDER — HEPARIN SOD (PORK) LOCK FLUSH 100 UNIT/ML IV SOLN
500.0000 [IU] | INTRAVENOUS | Status: AC | PRN
  Administered 2022-03-16: 500 [IU]

## 2022-03-16 MED ORDER — GUAIFENESIN 100 MG/5ML PO LIQD: 5.0000 mL | ORAL | 0 refills | Status: AC | PRN

## 2022-03-16 MED ORDER — MAGIC MOUTHWASH W/LIDOCAINE: 10.0000 mL | Freq: Four times a day (QID) | ORAL | 0 refills | Status: AC

## 2022-03-16 MED ORDER — METOPROLOL TARTRATE 25 MG PO TABS: 12.5000 mg | ORAL_TABLET | Freq: Two times a day (BID) | ORAL | 1 refills | Status: AC

## 2022-03-16 MED ORDER — FUROSEMIDE 20 MG PO TABS: 20.0000 mg | ORAL_TABLET | Freq: Every day | ORAL | 2 refills | Status: AC

## 2022-03-16 MED ORDER — ENSURE ENLIVE PO LIQD: 237.0000 mL | Freq: Two times a day (BID) | ORAL | 12 refills | Status: AC

## 2022-03-16 MED ORDER — POTASSIUM PHOSPHATES 15 MMOLE/5ML IV SOLN
30.0000 mmol | Freq: Once | INTRAVENOUS | Status: AC
  Administered 2022-03-16: 30 mmol via INTRAVENOUS
  Filled 2022-03-16: qty 10

## 2022-03-16 MED ORDER — PANTOPRAZOLE SODIUM 40 MG PO TBEC: 40.0000 mg | DELAYED_RELEASE_TABLET | Freq: Every day | ORAL | 1 refills | Status: AC

## 2022-03-16 MED ORDER — K PHOS MONO-SOD PHOS DI & MONO 155-852-130 MG PO TABS: 500.0000 mg | ORAL_TABLET | Freq: Two times a day (BID) | ORAL | Status: DC

## 2022-03-16 MED ORDER — K PHOS MONO-SOD PHOS DI & MONO 155-852-130 MG PO TABS: 500.0000 mg | ORAL_TABLET | Freq: Two times a day (BID) | ORAL | 0 refills | Status: AC

## 2022-03-16 MED ORDER — POTASSIUM CHLORIDE CRYS ER 20 MEQ PO TBCR: 20.0000 meq | EXTENDED_RELEASE_TABLET | Freq: Every day | ORAL | Status: AC

## 2022-03-16 NOTE — Plan of Care (Signed)
  Problem: Education: Goal: Knowledge of General Education information will improve Description: Including pain rating scale, medication(s)/side effects and non-pharmacologic comfort measures Outcome: Adequate for Discharge   Problem: Health Behavior/Discharge Planning: Goal: Ability to manage health-related needs will improve 03/16/2022 1605 by Saunders Glance, RN Outcome: Adequate for Discharge 03/16/2022 1605 by Saunders Glance, RN Outcome: Progressing   Problem: Clinical Measurements: Goal: Ability to maintain clinical measurements within normal limits will improve Outcome: Adequate for Discharge Goal: Will remain free from infection Outcome: Adequate for Discharge Goal: Diagnostic test results will improve Outcome: Adequate for Discharge Goal: Respiratory complications will improve Outcome: Adequate for Discharge Goal: Cardiovascular complication will be avoided Outcome: Adequate for Discharge   Problem: Activity: Goal: Risk for activity intolerance will decrease Outcome: Adequate for Discharge   Problem: Nutrition: Goal: Adequate nutrition will be maintained Outcome: Adequate for Discharge   Problem: Coping: Goal: Level of anxiety will decrease Outcome: Adequate for Discharge   Problem: Elimination: Goal: Will not experience complications related to bowel motility Outcome: Adequate for Discharge Goal: Will not experience complications related to urinary retention Outcome: Adequate for Discharge   Problem: Pain Managment: Goal: General experience of comfort will improve Outcome: Adequate for Discharge   Problem: Safety: Goal: Ability to remain free from injury will improve Outcome: Adequate for Discharge   Problem: Skin Integrity: Goal: Risk for impaired skin integrity will decrease Outcome: Adequate for Discharge

## 2022-03-16 NOTE — Progress Notes (Signed)
Patient and her son and daughter given medication, follow up, and discharge instructions, verbalized understanding, IV team deaccessed port, telemetry monitor removed, personal belongings with patient, family to transport home

## 2022-03-16 NOTE — TOC Transition Note (Signed)
Transition of Care [facility]) - CM/SW Discharge Note   Patient Details  Name: [name] MRN: [id number] Date of Birth: 1945-09-28  Transition [facility] CM/SW Contact:  Henrietta Dine, RN Phone Number: 03/16/2022, 1:00 PM   Clinical Narrative:    D/C orders placed for pt; notified Corene Cornea at [facility] of pt d/c; verified address; family member; pt's dtr Leanora Cover at bedside request information for hospice for family counseling; resource added to d/c instructions; the family will make their own appt for these services; no TOC needs.      Barriers to Discharge: Continued Medical Work up   Patient Goals and CMS Choice Patient states their goals for this hospitalization and ongoing recovery are:: to return home      Discharge Placement                       Discharge Plan and Services   Discharge Planning Services: CM Consult                                 Social Determinants of Health (SDOH) Interventions     Readmission Risk Interventions    02/22/2022   12:08 PM 02/18/2022    3:51 PM  Readmission Risk Prevention Plan  Transportation Screening Complete Complete  PCP or Specialist Appt within 5-7 Days Complete Complete  Home Care Screening Complete Complete  Medication Review (RN CM) Complete Complete

## 2022-03-16 NOTE — Discharge Summary (Signed)
Physician Discharge Summary  [name] ZOX:[id number] DOB: 05-13-1945 DOA: 03/10/2022  PCP: Martinique, Betty G, MD  Admit date: 03/10/2022 Discharge date: 03/16/2022  Time spent: 60 minutes  Recommendations for Outpatient Follow-up:  Follow-up with Dr. Collene Mares, gastroenterology in 2 weeks. Follow-up with Dr. Lindi Adie, medical oncology as previously scheduled. Follow-up with Martinique, Betty G, MD in 1 week.  On follow-up patient will need a basic metabolic profile, magnesium level, phosphorus level to follow-up on electrolytes and renal function.   Discharge Diagnoses:  Principal Problem:   Odynophagia Active Problems:   Mixed hyperlipidemia   HTN (hypertension)   GERD (gastroesophageal reflux disease)   CAD (coronary artery disease)   Malignant neoplasm of left female breast (HCC)   Diastolic dysfunction without heart failure   Prediabetes   Metastatic cancer to bone (HCC)   Leukopenia   Thrombocytopenia (HCC)   Mild protein malnutrition (HCC)   Dehydration   Radiation-induced esophagitis   Generalized weakness   Hypophosphatemia   Failure to thrive in adult   Esophagitis   Essential hypertension   Discharge Condition: Stable and improved  Diet recommendation: Regular  Filed Weights   03/10/22 0431 03/10/22 0532  Weight: 85.2 kg 84.8 kg    History of present illness:  HPI per Dr. Olevia Bowens : [name] is a 76 y.o. female with medical history significant of bilateral lower extremity edema, bladder cancer, CAD, diverticulosis, diverticulitis, eczema, fibromyalgia, GERD, hemorrhoids, hiatal hernia, colon polyps, history of left breast cancer with chemotherapy and radiation, gastritis, cholelithiasis, hyperlipidemia, hypertension who was brought to the emergency department due to left flank pain.  However, the patient's main complaint is dysphagia/odynophagia that has been getting progressively worse in the last few weeks despite taking analgesics.  No fever, chills  or night sweats.  She has been dyspneic at times.  No rhinorrhea, wheezing or hemoptysis.  No chest pain, palpitations, diaphoresis, PND, orthopnea or pitting edema of the lower extremities.  Her appetite is decreased.  She has not been having regular bowel movements.  She has only had small amounts of semiloose stools.  No melena or hematochezia.  Positive left flank pain, but no dysuria, frequency or hematuria.  No polyuria, polydipsia, polyphagia or blurred vision.   ED course: Initial vital signs were temperature 97.9 F, pulse 62, respiration 18, BP 150/67 mmHg O2 sat 98% on room air.  The patient received 1500 mL of LR/NS bolus, 30 mL of Maalox, famotidine 20 mg IVP, hydromorphone 0.5 mg IVP and ondansetron 4 mg IVP.   Lab work: Urinalysis with ketonuria 20 and trace leukocyte esterase.  CBC showed her white count at 3.0, hemoglobin 12.6 g/dL platelets 323.  CMP with a sodium 133, glucose 114.  Total protein 6.3 and albumin 3.3 g/dL.  The rest of the CMP measurements were normal.   Imaging: Portable 1 view chest radiograph with low volume chest miliary nodularity known from recent chest CT.  CT head without contrast with no acute or interval finding.  Hospital Course:  #1 odynophagia/dysphagia secondary to LA grade D radiation esophagitis -Patient presented with odynophagia/dysphagia, history of radiation for left breast cancer recently completed radiation. -Patient noted to have had worsening odynophagia/dysphagia with poor oral intake/hydration leading to lethargy. -Status post trial of fluconazole, Biotene without any significant improvement. -On PPI, Magic mouthwash with lidocaine 4 times daily with some improvement. -Patient seen by GI underwent upper endoscopy 03/12/2022 which showed LA grade D radiation esophagitis without any bleeding. -Patient seen by GI recommended continuation of  PPI, Magic mouthwash with lidocaine, Carafate. -Outpatient follow-up with GI, Dr. Collene Mares upon discharge.    2.  Chronic diastolic CHF/hypertension -Patient noted to be volume depleted on exam secondary to poor oral intake. -Patient noted to be on losartan 100 mg daily, Lasix 20 mg daily prior to admission. -BP currently stable off antihypertensive medications. -Patient started on low-dose beta-blocker due to tachycardia which we will continue. -Diuretics and losartan held and Lasix will be resumed 1 week postdischarge. -Outpatient follow-up.   3.  CAD/hyperlipidemia -Aspirin initially held during the hospitalization prior to patient undergoing EGD.   -Post EGD statin was resumed.   -Aspirin held during the hospitalization will be resumed on discharge.   -Patient also maintained on home regimen statin.   4.  GERD -Patient maintained on a PPI.    5.  Left breast cancer with bone mets -Patient status post chemoradiation. -Patient maintained on home regimen pain medications as well as IV Dilaudid early on during the hospitalization.   -IV Dilaudid subsequently discontinued.   -Outpatient follow-up with oncology.     6.  Leukopenia/thrombocytopenia -In the setting of chemo/radiotherapy. -Patient with no overt signs of bleeding or infection. -Leukopenia and thrombocytopenia improved slowly during the hospitalization.   -Outpatient follow-up.      7.  Mild protein malnutrition -Nutritional supplementation.   8.  Hypophosphatemia -Phosphorus level at 1.0 early on during the hospitalization, patient given K-Phos 30 mmol IV x 2 doses will be discharged on oral phosphorus supplementation with close outpatient follow-up with PCP for repeat labs..    Procedures: CT head 03/10/2022 Chest x-ray 03/10/2022 Upper endoscopy per Dr. Benson Norway 03/12/2022  Consultations: Gastroenterology: Dr. Benson Norway 03/11/2022  Discharge Exam: Vitals:   03/16/22 0503 03/16/22 1315  BP: 113/63 (!) 129/56  Pulse: (!) 103 (!) 101  Resp: 18 (!) 21  Temp: 98.3 F (36.8 C) 99 F (37.2 C)  SpO2: 97% 97%    General:  NAD Cardiovascular: RRR no murmurs rubs or gallops.  No JVD.  No lower extremity edema. Respiratory: Clear to auscultation bilaterally.  No wheezes, no crackles, no rhonchi.  Fair air movement.  Discharge Instructions   Discharge Instructions     Diet general   Complete by: As directed    Face-to-face encounter (required for Medicare/Medicaid patients)   Complete by: As directed    I Mirna Mires certify that this patient is under my care and that I, or a nurse practitioner or physician's assistant working with me, had a face-to-face encounter that meets the physician face-to-face encounter requirements with this patient on 03/10/2022. The encounter with the patient was in whole, or in part for the following medical condition(s) which is the primary reason for home health care (List medical condition): metastatic cancer to bones, general weakness, deconditioning   The encounter with the patient was in whole, or in part, for the following medical condition, which is the primary reason for home health care: metastatic cancer to bones, general weakness, deconditioning   I certify that, based on my findings, the following services are medically necessary home health services:  Nursing Physical therapy     Reason for Medically Necessary Home Health Services:  Skilled Nursing- Change/Decline in Patient Status Therapy- Instruction on Safe use of Assistive Devices for ADLs Therapy- Home Adaptation to Facilitate Safety     My clinical findings support the need for the above services: Pain interferes with ambulation/mobility   Further, I certify that my clinical findings support that this patient is homebound  due to: Pain interferes with ambulation/mobility   Home Health   Complete by: As directed    To provide the following care/treatments:  Home Health Aide RN PT     Increase activity slowly   Complete by: As directed       Allergies as of 03/16/2022       Reactions    Hydrochlorothiazide Other (See Comments)   High Ca++   Flomax [tamsulosin] Itching   Codeine Itching   Statins Other (See Comments)   Muscle aches   Zetia [ezetimibe] Other (See Comments)   Muscle aches        Medication List     STOP taking these medications    fluconazole 100 MG tablet Commonly known as: DIFLUCAN   losartan 100 MG tablet Commonly known as: COZAAR       TAKE these medications    acetaminophen 325 MG tablet Commonly known as: TYLENOL Take 650 mg by mouth every 6 (six) hours as needed for mild pain.   ALIGN PO Take 1 tablet by mouth daily.   BIOTIN PO Take 1 tablet by mouth daily.   CALCIUM CITRATE + D3 PO Take 1 tablet by mouth 2 (two) times daily.   carboxymethylcellulose 0.5 % Soln Commonly known as: REFRESH PLUS Place 1 drop into both eyes daily as needed (dry eyes).   cetirizine 10 MG tablet Commonly known as: ZYRTEC Take 10 mg by mouth daily as needed for allergies.   cyanocobalamin 1000 MCG tablet Take 1,000 mcg by mouth daily.   feeding supplement Liqd Take 237 mLs by mouth 2 (two) times daily between meals.   fluocinonide cream 0.05 % Commonly known as: LIDEX Apply 1 Application topically 2 (two) times daily as needed (eczema).   furosemide 20 MG tablet Commonly known as: LASIX Take 1 tablet (20 mg total) by mouth daily. Start taking on: March 23, 2022 What changed: These instructions start on March 23, 2022. If you are unsure what to do until then, ask your doctor or other care provider.   guaiFENesin 100 MG/5ML liquid Commonly known as: ROBITUSSIN Take 5 mLs by mouth every 4 (four) hours as needed for cough or to loosen phlegm.   magic mouthwash w/lidocaine Soln Take 10 mLs by mouth 4 (four) times daily.   meclizine 25 MG tablet Commonly known as: ANTIVERT TAKE 1 TABLET BY MOUTH 3  TIMES DAILY AS NEEDED FOR  DIZZINESS What changed:  how much to take how to take this when to take this reasons to take  this additional instructions   metoprolol tartrate 25 MG tablet Commonly known as: LOPRESSOR Take 0.5 tablets (12.5 mg total) by mouth 2 (two) times daily.   multivitamin with minerals Tabs tablet Take 1 tablet by mouth daily. Start taking on: March 17, 2022   ondansetron 8 MG tablet Commonly known as: ZOFRAN Take 1 tablet (8 mg total) by mouth every 8 (eight) hours as needed for nausea or vomiting.   oxyCODONE 15 MG immediate release tablet Commonly known as: ROXICODONE Take 1 tablet (15 mg total) by mouth every 6 (six) hours as needed. What changed:  when to take this reasons to take this   OxyCONTIN 30 MG 12 hr tablet Generic drug: oxyCODONE Take 1 tablet (30 mg total) by mouth every 12 (twelve) hours. What changed: Another medication with the same name was changed. Make sure you understand how and when to take each.   pantoprazole 40 MG tablet Commonly known as: PROTONIX Take 1  tablet (40 mg total) by mouth daily.   phosphorus 155-852-130 MG tablet Commonly known as: K PHOS NEUTRAL Take 2 tablets (500 mg total) by mouth 2 (two) times daily for 5 days. Start taking on: March 17, 2022   polyethylene glycol 17 g packet Commonly known as: MIRALAX / GLYCOLAX Take 17 g by mouth daily.   potassium chloride SA 20 MEQ tablet Commonly known as: KLOR-CON M Take 1 tablet (20 mEq total) by mouth daily. Start taking on: March 23, 2022 What changed:  how much to take how to take this when to take this additional instructions These instructions start on March 23, 2022. If you are unsure what to do until then, ask your doctor or other care provider.   rosuvastatin 10 MG tablet Commonly known as: CRESTOR 2 to 3 x week in am What changed:  how much to take how to take this when to take this additional instructions   sucralfate 1 g tablet Commonly known as: Carafate Take 1 tablet (1 g total) by mouth 4 (four) times daily -  with meals and at bedtime. 5 min  before meals for radiation induced esophagitis       Allergies  Allergen Reactions   Hydrochlorothiazide Other (See Comments)    High Ca++   Flomax [Tamsulosin] Itching   Codeine Itching   Statins Other (See Comments)    Muscle aches   Zetia [Ezetimibe] Other (See Comments)    Muscle aches    Follow-up Information     Juanita Craver, MD. Schedule an appointment as soon as possible for a visit in 2 week(s).   Specialty: Gastroenterology Contact information: [address], [place] [address] [phone]         Martinique, Betty G, MD. Schedule an appointment as soon as possible for a visit in 1 week(s).   Specialty: Family Medicine Why: HFU and needs BMET, MAG, PHOS levels Contact information: [place] [address] [phone]         Nicholas Lose, MD Follow up.   Specialty: Hematology and Oncology Why: Follow-up as scheduled. Contact information: [place] [address] [phone]                  The results of significant diagnostics from this hospitalization (including imaging, microbiology, ancillary and laboratory) are listed below for reference.    Significant Diagnostic Studies: CT Head Wo Contrast  Result Date: 03/10/2022 CLINICAL DATA:  Mental status change with unknown cause EXAM: CT HEAD WITHOUT CONTRAST TECHNIQUE: Contiguous axial images were obtained from the base of the skull through the vertex without intravenous contrast. RADIATION DOSE REDUCTION: This exam was performed according to the departmental dose-optimization program which includes automated exposure control, adjustment of the mA and/or kV according to patient size and/or use of iterative reconstruction technique. COMPARISON:  02/17/2022 brain MRI FINDINGS: Brain: Ovoid high-density at the posterior left thalamus is nonacute by MRI stability since 2020, stealthy on most sequences possibly a cavernoma. Asymmetric  calcification at the left globus pallidus. No acute infarct, hydrocephalus, collection, or mass. Generalized cerebral volume loss. Vascular: No hyperdense vessel or unexpected calcification. Skull: Normal. Negative for fracture or focal lesion. Sinuses/Orbits: No acute finding. IMPRESSION: No acute or interval finding Electronically Signed   By: Jorje Guild M.D.   On: 03/10/2022 08:17   DG Chest Port 1 View  Result Date: 03/10/2022 CLINICAL DATA:  Altered mental status EXAM: PORTABLE CHEST 1 VIEW COMPARISON:  02/16/2022 FINDINGS: Low volume chest with diffuse interstitial coarsening that has a reticulonodular appearance. Generalized nodularity by recent chest CT. There is a left paramediastinal mass which is occult by this frontal view. Right porta catheter with tip at the upper right atrium. Postoperative left axilla. IMPRESSION: 1. Low volume chest. 2. Miliary nodularity known from recent chest CT. Electronically Signed   By: Jorje Guild M.D.   On: 03/10/2022 06:53   IR IMAGING GUIDED PORT INSERTION  Result Date: 02/21/2022 INDICATION: 76 year old female with history of advanced stage bladder cancer and lung mass requiring central venous access for chemotherapy administration EXAM: IMPLANTED PORT A CATH PLACEMENT WITH ULTRASOUND AND FLUOROSCOPIC GUIDANCE COMPARISON:  None Available. MEDICATIONS: None. ANESTHESIA/SEDATION: Moderate (conscious) sedation was employed during this procedure. A total of Versed 1 mg and Fentanyl 50 mcg was administered intravenously. Moderate Sedation Time: 17 minutes. The patient's level of consciousness and vital signs were monitored continuously by radiology nursing throughout the procedure under my direct supervision. CONTRAST:  None FLUOROSCOPY TIME:  0.1 minutes, 0 mGy COMPLICATIONS: None immediate. PROCEDURE: The procedure, risks, benefits, and alternatives were explained to the patient. Questions regarding the procedure were encouraged and answered. The patient  understands and consents to the procedure. The right neck and chest were prepped with chlorhexidine in a sterile fashion, and a sterile drape was applied covering the operative field. Maximum barrier sterile technique with sterile gowns and gloves were used for the procedure. A timeout was performed prior to the initiation of the procedure. Ultrasound was used to examine the jugular vein which was compressible and free of internal echoes. A skin marker was used to demarcate the planned venotomy and port pocket incision sites. Local anesthesia was provided to these sites and the subcutaneous tunnel track with 1% lidocaine with 1:100,000 epinephrine. A small incision was created at the jugular access site and blunt dissection was performed of the subcutaneous tissues. Under ultrasound guidance, the jugular vein was accessed with a 21 ga micropuncture needle and an 0.018" wire was inserted to the superior vena cava. Real-time ultrasound guidance was utilized for vascular access including the acquisition of a permanent ultrasound image documenting patency of the accessed vessel. A 5 Fr micopuncture set was then used, through which a 0.035" Rosen wire was passed under fluoroscopic guidance into the inferior vena cava. An 8 Fr dilator was then placed over the wire. A subcutaneous port pocket was then created along the upper chest wall utilizing a combination of sharp and blunt dissection. The pocket was irrigated with sterile saline, packed with gauze, and observed for hemorrhage. A single lumen standard size power injectable port was chosen for placement. The 8 Fr catheter was tunneled from the port pocket site to the venotomy incision. The port was placed in the pocket. The external catheter was trimmed to appropriate length. The dilator was exchanged for an 8 Fr peel-away sheath under fluoroscopic guidance. The catheter was then placed through the sheath and the sheath was removed. Final catheter positioning was  confirmed and documented with a fluoroscopic spot radiograph. The port was accessed with a Huber needle, aspirated, and flushed with heparinized saline. The deep dermal layer of the port pocket incision was closed with interrupted 3-0 Vicryl suture. The skin was opposed with a running subcuticular 4-0 Monocryl suture. Dermabond was then placed over the port pocket and neck incisions. The patient tolerated the procedure well without immediate post procedural complication. FINDINGS: After catheter placement, the tip lies within the superior cavoatrial junction. The catheter  aspirates and flushes normally and is ready for immediate use. IMPRESSION: Successful placement of a power injectable Port-A-Cath via the right internal jugular vein. The catheter is ready for immediate use. Ruthann Cancer, MD [facility] Electronically Signed   By: Ruthann Cancer M.D.   On: 02/21/2022 16:47   CT BONE TROCAR/NEEDLE BIOPSY SUPERFICIAL  Result Date: 02/18/2022 INDICATION: 76 year old female referred for biopsy of right chest wall mass EXAM: CT-GUIDED BIOPSY RIGHT CHEST WALL MASS MEDICATIONS: None. ANESTHESIA/SEDATION: Moderate (conscious) sedation was not employed during this procedure. Moderate Sedation Time: 0 minutes. The patient's level of consciousness and vital signs were monitored continuously by radiology nursing throughout the procedure under my direct supervision. FLUOROSCOPY TIME:  CT COMPLICATIONS: None PROCEDURE: Informed written consent was obtained from the patient after a thorough discussion of the procedural risks, benefits and alternatives. All questions were addressed. Maximal Sterile Barrier Technique was utilized including caps, mask, sterile gowns, sterile gloves, sterile drape, hand hygiene and skin antiseptic. A timeout was performed prior to the initiation of the procedure. Patient positioned supine on the CT gantry table. Scout CT was acquired for  planning purposes. The right chest wall was prepped with chlorhexidine in a sterile fashion, and a sterile drape was applied covering the operative field. A sterile gown and sterile gloves were used for the procedure. Local anesthesia was provided with 1% Lidocaine. CT guidance was used for placement of a guide needle into the right chest wall mass, with soft tissue replacing right seventh rib. Needle was tangential to the rib, directed superficially. Multiple 18 gauge core biopsy then acquired in placed into formalin. Needle was removed and CT was performed at 5 minutes and 10 minutes to observe a small hematoma which did not enlarge over time. Patient tolerated the procedure well and remained hemodynamically stable throughout. No complications were encountered and no significant blood loss was encounter IMPRESSION: Status post CT-guided biopsy of soft tissue mass replacing the right anterior seventh rib Signed, Dulcy Fanny. Nadene Rubins, RPVI [facility] Electronically Signed   By: Corrie Mckusick D.O.   On: 02/18/2022 13:03   MR TOTAL SPINE METS SCREENING  Result Date: 02/18/2022 CLINICAL DATA:  Lung mass, evaluate for metastatic disease. EXAM: MRI TOTAL SPINE WITHOUT AND WITH CONTRAST TECHNIQUE: Multisequence MR imaging of the spine from the cervical spine to the sacrum was performed prior to and following IV contrast administration for evaluation of spinal metastatic disease. CONTRAST:  28mL GADAVIST GADOBUTROL 1 MMOL/ML IV SOLN COMPARISON:  No prior MRI of the spine available, correlation is made with CT chest abdomen pelvis 02/16/2022 FINDINGS: Vertebrae: Multifocal areas of decreased T1 signal, increased T2 signal, and contrast enhancement are seen throughout the spine, ribs, and sacrum. The most prominently involved vertebral body is T7, where abnormal signal makes up the majority of the vertebral body (series 25, image 9) with an additional focus  in the spinous process (series 25, image 8). No evidence of pathologic fracture. Additional smaller foci of abnormal signal are seen in C2 (series 25, images 8 and 10), C7 body and inferior articular facet (series 25, images 8, 10, and 14), T1 (series 25, image 9), T2 inferior articular facet and rib (series 25, images 1 and 10), third rib (series 25, image 14), T4 body and spinous process (series 25, images 9 and 10), T5 pedicle (series 25, image 6), T8 (series 25, images 6 and 11), T9 body and rib (series 25, images 1  and 6), T10 (series 26, images 6, 9, and 12), T11 (series 26, images 7 and 8), T12 (series 26, images 6-8, 11- 13), L1 (series 26, image 12), L2 (series 26, images 6-11), L3 (series 26, images 7, 8, 10, and 12), L4 inferior articular facet, pedicle, and transverse process (series 26, images 4, 14, and 26), L5 (series 26, image 8), and the sacrum and iliac bones (series 26, images 1-3, 11-13, 16, and 19). No definite posterior cortical breakthrough and epidural extension of tumor. The posterior cortex T7, T11, and T12 (series 25, image 8 and series 26, image 8), appears intact. No high-grade spinal canal stenosis. Segmentation: 5 lumbar-type vertebral bodies. Alignment: Physiologic. Cord:  Normal morphology and signal.  No abnormal enhancement. IMPRESSION: 1. Multifocal osseous metastatic disease throughout the spine, ribs, and sacrum and iliac bones, as described above. No evidence of pathologic fracture. 2. No definite epidural extension of tumor. The axial images that were purportedly obtained with this scan were not transmitted at the time of interpretation, and multiple attempts to request the sequences went unanswered. If there is persistent concern, an addendum could be made when these images are transmitted. Electronically Signed   By: Merilyn Baba M.D.   On: 02/18/2022 02:23   MR BRAIN W WO CONTRAST  Result Date: 02/18/2022 CLINICAL DATA:  Lung cancer, staging EXAM: MRI HEAD WITHOUT AND  WITH CONTRAST TECHNIQUE: Multiplanar, multiecho pulse sequences of the brain and surrounding structures were obtained without and with intravenous contrast. CONTRAST:  18mL GADAVIST GADOBUTROL 1 MMOL/ML IV SOLN COMPARISON:  07/14/2018 MRI head FINDINGS: Brain: No restricted diffusion to suggest acute or subacute infarct. No abnormal parenchymal or meningeal enhancement. No acute hemorrhage, mass, mass effect, or midline shift. No hydrocephalus or extra-axial collection. No hemosiderin deposition to suggest remote hemorrhage. Scattered T2 hyperintense signal in the periventricular white matter, likely the sequela of mild chronic small vessel ischemic disease. Vascular: Normal arterial flow voids. Normal arterial and venous enhancement. Skull and upper cervical spine: Enhancing osseous lesions in the left parietal bone (series 16, images 79 and 136), right parietal bone (series 16, image 136), right frontal bone (series 16, image 118), and the head of the left mandible (series 16, image 20). Sinuses/Orbits: No acute finding. Status post bilateral lens replacements. Other: Trace fluid in the bilateral mastoid air cells. IMPRESSION: 1. Enhancing osseous lesions in the left parietal bone, right parietal bone, right frontal bone, and head of the left mandible, concerning for osseous metastatic disease. 2. No evidence of intracranial metastatic disease. Electronically Signed   By: Merilyn Baba M.D.   On: 02/18/2022 01:28   CT Chest Wo Contrast  Result Date: 02/16/2022 CLINICAL DATA:  Lung nodule, > 69mm.  Dyspnea EXAM: CT CHEST WITHOUT CONTRAST TECHNIQUE: Multidetector CT imaging of the chest was performed following the standard protocol without IV contrast. RADIATION DOSE REDUCTION: This exam was performed according to the departmental dose-optimization program which includes automated exposure control, adjustment of the mA and/or kV according to patient size and/or use of iterative reconstruction technique.  COMPARISON:  None Available. FINDINGS: Cardiovascular: Moderate multi-vessel coronary artery calcification. Global cardiac size is within normal limits. No pericardial effusion. Central pulmonary arteries are of normal caliber. Mild atherosclerotic calcification within the thoracic aorta. No aortic aneurysm. Mediastinum/Nodes: No enlarged mediastinal or axillary lymph nodes. Thyroid gland, trachea, and esophagus demonstrate no significant findings. Lungs/Pleura: The opacity seen on recent chest radiograph corresponds to a lobulated soft tissue mass within the a anterior left upper lobe measuring  2.9 x 4.1 cm at axial image # 43/2, suspicious for a primary bronchogenic neoplasm. The mass abuts the pleural surface but does not clearly transgress into the anterior mediastinum. There are innumerable micro nodules seen distributed randomly throughout the lungs bilaterally which appear new since the visualized lung bases on examination of 11/18/2017 and are suspicious for intra pulmonary miliary metastatic disease. Small bilateral pleural effusions are present. No pneumothorax. No central obstructing lesion. Upper Abdomen: No acute abnormality. Musculoskeletal: There are erosive changes involving the right eighth rib anteriorly and left seventh ribs anterolaterally in keeping with osseous metastatic disease. There are, additionally, subtle lytic lesions seen throughout the thoracic spine, best appreciated within the T10 and T11 vertebral bodies also suspicious osseous metastatic disease. There is a acute appearing fracture of the right seventh rib laterally with associated subpleural thickening likely representing edema. No associated soft tissue mass or erosion is identified and this may simply represent a posttraumatic fracture. Left mastectomy and axillary lymph node dissection has been performed. IMPRESSION: 1. 4.1 cm left upper lobe pulmonary mass suspicious for a primary bronchogenic neoplasm. Innumerable micro  nodules distributed randomly throughout the lungs bilaterally suspicious for intra pulmonary miliary metastatic disease. PET CT examination may be helpful to direct biopsy strategy, if indicated. 2. Osseous metastatic disease with erosive changes involving the right eighth rib anteriorly and left seventh ribs anterolaterally. Additional subtle lytic lesions seen throughout the thoracic spine, best appreciated within the T10 and T11 vertebral bodies also suspicious for osseous metastatic disease. This would be better assessed with contrast enhanced MRI examination if indicated. 3. Acute appearing fracture of the right seventh rib laterally with associated subpleural thickening likely representing edema. No associated soft tissue mass or erosion is identified and this may simply represent a posttraumatic fracture. 4. Moderate multi-vessel coronary artery calcification. 5. Small bilateral pleural effusions. 6. Left mastectomy and axillary lymph node dissection. Aortic Atherosclerosis (ICD10-I70.0). Electronically Signed   By: Fidela Salisbury M.D.   On: 02/16/2022 20:22   DG Chest 1 View  Result Date: 02/16/2022 CLINICAL DATA:  Upper abdominal pain.  Shortness of breath. EXAM: CHEST  1 VIEW COMPARISON:  09/23/2011 FINDINGS: Asymmetric elevation left hemidiaphragm. Right lung is clear. Focal opacity identified in the medial left upper lobe. The cardiopericardial silhouette is within normal limits for size. The visualized bony structures of the thorax are unremarkable. Telemetry leads overlie the chest. IMPRESSION: Focal masslike opacity in the medial left upper lobe. This could potentially be pneumonia, but imaging features are suspicious for soft tissue mass lesion. CT chest recommended to further evaluate. Electronically Signed   By: Misty Stanley M.D.   On: 02/16/2022 18:44   CT ABDOMEN PELVIS W CONTRAST  Result Date: 02/16/2022 CLINICAL DATA:  Acute abdominal pain. EXAM: CT ABDOMEN AND PELVIS WITH CONTRAST  TECHNIQUE: Multidetector CT imaging of the abdomen and pelvis was performed using the standard protocol following bolus administration of intravenous contrast. RADIATION DOSE REDUCTION: This exam was performed according to the departmental dose-optimization program which includes automated exposure control, adjustment of the mA and/or kV according to patient size and/or use of iterative reconstruction technique. CONTRAST:  16mL OMNIPAQUE IOHEXOL 300 MG/ML  SOLN COMPARISON:  CT abdomen and pelvis 11/24/2020 FINDINGS: Lower chest: There are new innumerable bilateral pulmonary micro nodules throughout both lungs. There are new small bilateral pleural effusions. Hepatobiliary: Gallstones are present. There is no biliary ductal dilatation. The liver is within normal limits. Pancreas: Unremarkable. No pancreatic ductal dilatation or surrounding inflammatory changes. Spleen: Normal  in size without focal abnormality. Adrenals/Urinary Tract: Adrenal glands are unremarkable. Kidneys are normal, without renal calculi, focal lesion, or hydronephrosis. Bladder is unremarkable. Stomach/Bowel: There is diffuse colonic diverticulosis without evidence for acute diverticulitis. Appendix is not visualized. No bowel obstruction, wall thickening, focal inflammation or free air. Stomach within normal limits. Vascular/Lymphatic: Aortic atherosclerosis. No enlarged abdominal or pelvic lymph nodes. Reproductive: Status post hysterectomy. No adnexal masses. Other: No ascites or free air. Surgical clips are seen in the left inguinal region. There is some scarring in the left anterior abdominal wall. Musculoskeletal: There is expansile rib lesion with soft tissue component of the anterolateral right eighth rib fracture. This lesion measures 2.6 x 4.9 x 3.7 cm. There also small lytic lesions in the left anterolateral seventh rib. There also likely small lytic lesions throughout multiple lower thoracic vertebral bodies, particularly at T10 and  T11. No acute fractures are seen. IMPRESSION: 1. New innumerable pulmonary micro nodules throughout both lungs worrisome for metastatic disease. 2. New small bilateral pleural effusions. 3. Expansile lytic lesion of the right eighth rib with soft tissue component worrisome for metastatic disease. There are additional lytic lesions in the left seventh rib and lower thoracic spine concerning for metastatic disease. 4. No acute localizing process in the abdomen or pelvis. 5. Cholelithiasis. 6. Colonic diverticulosis. Aortic Atherosclerosis (ICD10-I70.0). Electronically Signed   By: Ronney Asters M.D.   On: 02/16/2022 18:40    Microbiology: No results found for this or any previous visit (from the past 240 hour(s)).   Labs: Basic Metabolic Panel: Recent Labs  Lab 03/12/22 0402 03/13/22 0332 03/14/22 0347 03/15/22 0324 03/16/22 0308  NA 132* 138 136 136 135  K 4.1 3.8 3.8 3.9 3.9  CL 104 108 106 106 103  CO2 23 24 26 25 28   GLUCOSE 102* 109* 120* 113* 117*  BUN 7* 6* 8 7* 7*  CREATININE 0.41* 0.34* 0.44 0.42* 0.51  CALCIUM 8.5* 8.4* 8.9 8.9 9.0  MG  --   --   --  1.9 1.9  PHOS  --   --   --  1.0* 1.3*   Liver Function Tests: Recent Labs  Lab 03/10/22 0728 03/11/22 0254 03/15/22 0324  AST 22 27  --   ALT 14 19  --   ALKPHOS 96 83  --   BILITOT 0.7 0.6  --   PROT 6.3* 5.6*  --   ALBUMIN 3.3* 2.9* 2.8*   No results for input(s): "LIPASE", "AMYLASE" in the last 168 hours. No results for input(s): "AMMONIA" in the last 168 hours. CBC: Recent Labs  Lab 03/10/22 0728 03/11/22 0254 03/12/22 0402 03/13/22 0332 03/14/22 0347 03/15/22 0324 03/16/22 0308  WBC 3.0*   < > 2.5* 2.9* 3.2* 3.5* 3.4*  NEUTROABS 2.5  --  1.9 2.3 2.3  --  2.1  HGB 12.6   < > 11.4* 11.1* 10.8* 10.9* 10.8*  HCT 41.7   < > 36.3 35.5* 34.8* 35.6* 34.8*  MCV 85.6   < > 84.2 83.3 83.7 83.6 83.3  PLT 123*   < > 100* 106* 115* 122* 125*   < > = values in this interval not displayed.   Cardiac Enzymes: No  results for input(s): "CKTOTAL", "CKMB", "CKMBINDEX", "TROPONINI" in the last 168 hours. BNP: BNP (last 3 results) No results for input(s): "BNP" in the last 8760 hours.  ProBNP (last 3 results) No results for input(s): "PROBNP" in the last 8760 hours.  CBG: No results for input(s): "GLUCAP" in the  last 168 hours.     Signed:  Irine Seal MD.  [facility] 03/16/2022, 3:48 PM

## 2022-03-17 ENCOUNTER — Encounter: Payer: Self-pay | Admitting: Hematology and Oncology

## 2022-03-17 DIAGNOSIS — C7802 Secondary malignant neoplasm of left lung: Secondary | ICD-10-CM | POA: Diagnosis not present

## 2022-03-17 DIAGNOSIS — M1990 Unspecified osteoarthritis, unspecified site: Secondary | ICD-10-CM | POA: Diagnosis not present

## 2022-03-17 DIAGNOSIS — K219 Gastro-esophageal reflux disease without esophagitis: Secondary | ICD-10-CM | POA: Diagnosis not present

## 2022-03-17 DIAGNOSIS — Z7951 Long term (current) use of inhaled steroids: Secondary | ICD-10-CM | POA: Diagnosis not present

## 2022-03-17 DIAGNOSIS — C7951 Secondary malignant neoplasm of bone: Secondary | ICD-10-CM | POA: Diagnosis not present

## 2022-03-17 DIAGNOSIS — Z9181 History of falling: Secondary | ICD-10-CM | POA: Diagnosis not present

## 2022-03-17 DIAGNOSIS — Z853 Personal history of malignant neoplasm of breast: Secondary | ICD-10-CM | POA: Diagnosis not present

## 2022-03-17 DIAGNOSIS — E785 Hyperlipidemia, unspecified: Secondary | ICD-10-CM | POA: Diagnosis not present

## 2022-03-17 DIAGNOSIS — I2510 Atherosclerotic heart disease of native coronary artery without angina pectoris: Secondary | ICD-10-CM | POA: Diagnosis not present

## 2022-03-17 DIAGNOSIS — M797 Fibromyalgia: Secondary | ICD-10-CM | POA: Diagnosis not present

## 2022-03-17 DIAGNOSIS — Z87891 Personal history of nicotine dependence: Secondary | ICD-10-CM | POA: Diagnosis not present

## 2022-03-17 DIAGNOSIS — Z79891 Long term (current) use of opiate analgesic: Secondary | ICD-10-CM | POA: Diagnosis not present

## 2022-03-17 DIAGNOSIS — C7911 Secondary malignant neoplasm of bladder: Secondary | ICD-10-CM | POA: Diagnosis not present

## 2022-03-17 DIAGNOSIS — I10 Essential (primary) hypertension: Secondary | ICD-10-CM | POA: Diagnosis not present

## 2022-03-17 DIAGNOSIS — K5790 Diverticulosis of intestine, part unspecified, without perforation or abscess without bleeding: Secondary | ICD-10-CM | POA: Diagnosis not present

## 2022-03-17 DIAGNOSIS — D630 Anemia in neoplastic disease: Secondary | ICD-10-CM | POA: Diagnosis not present

## 2022-03-17 NOTE — Progress Notes (Signed)
Clarification called to [facility] for Magic Mouthwash prescription: 278ml Maalox 127ml Nystatin 691ml Benadryl 362ml Lidocaine = 1212ml. Swish and spit 42ml QID   [name] S. Alford Highland, PharmD, BCPS Clinical Staff Pharmacist [email or web address]

## 2022-03-18 ENCOUNTER — Telehealth: Payer: Self-pay | Admitting: *Deleted

## 2022-03-18 ENCOUNTER — Telehealth: Payer: Self-pay | Admitting: Licensed Clinical Social Worker

## 2022-03-18 NOTE — Telephone Encounter (Signed)
TCM 1st attempt to call the patient. Left message on machine for patient to return our call.

## 2022-03-18 NOTE — Telephone Encounter (Signed)
[place] Clinical Social Work  TC from daughter-in-law Blythe Stanford) and son, asking about personal aide services as patient's care situation has changed. She previously was being cared for by her sister and niece, but now has multiple family helping and about a 3 hour gap where they would like to have an aide. Discussed that usually cost is out of pocket and potential for referral or giving agency list.  Also dicussed Medicaid and applying online and availability of PCS through Medicaid. Family voiced understanding and thanks and will look into Medicaid. They will call back with any further questions.   Lynnetta Tom E Adden Strout, LCSW

## 2022-03-18 NOTE — Progress Notes (Unsigned)
HPI: [name] is a 76 y.o. female, medical history significant of bilateral lower extremity edema, bladder cancer, CAD, diverticulosis, diverticulitis, eczema, fibromyalgia, GERD, hemorrhoids, hiatal hernia, colon polyps, history of left breast cancer with chemotherapy and radiation, gastritis, cholelithiasis, hyperlipidemia, hypertension here today with family (2 sons and daughter-in-law) to follow on recent hospitalization. She was admitted on 03/10/2022 and discharged home on 03/16/2022. [place] call attempt 03/18/22.  She presented today ED complaining of left flank pain, dysphagia ,and odynophagia that has been progressively worse for the past few weeks. No associated urinary symptoms. Found to be dehydrated due to poor oral intake.  She was previously admitted on 02/16/2022 and discharged on 02/22/2022, at that time she was complaining of abdominal,back,and rib pain.  Work-up in the ED (chest x-ray, head CT, chest CT, abdominal CT, brain MRI) revealed CT evidence of primary lung mass, pulmonary metastatic disease, bone mets, and rib fracture.  She was evaluated by oncologist at that time, she was admitted for pain management and Decadron.  She was started on radiation therapy.  Flank pain has improved, currently she is on MS Contin 30 mg twice daily and oxycodone 15 mg every 6 hours as needed, pain is 6/10, exacerbated by movement and alleviated by rest.  Odynophagia, dysphagia did not improve with trial of fluconazole or with Biotene. She underwent upper endoscopy on 03/12/2022 which show LA grade D radiation esophagitis without bleeding. Started on Protonix 40 mg daily mouthwash with lidocaine, and Carafate 1 g 4 times daily.  In general odynophagia has improved with mouthwash with lidocaine.  Her appetite is not great, still nauseated. Her family is struggling to find what she likes and encouraged to keep up with fluid/oral intake, offering small amounts q 20 min and protein  shakes,ensure. She has voided 2 times today. Last bowel movement yesterday.  Family is very supportive, somebody is staying with her 24/7, they are taking turns.  She was instructed to follow-up with GI as outpatient, she sees Dr. Collene Mares.  She has an appointment with oncologist today. Hypertension: She is currently on metoprolol titrate 25 mg 1/2 tablet twice daily. She also takes furosemide 20 mg daily for lower extremity edema. DOE and productive cough with clear mucus.  She is taking Robitussin 5 mL 4 times daily as needed. Negative for fever, chills, wheezing, CP.  Lab Results  Component Value Date   CREATININE 0.51 03/16/2022   BUN 7 (L) 03/16/2022   NA 135 03/16/2022   K 3.9 03/16/2022   CL 103 03/16/2022   CO2 28 03/16/2022   Lab Results  Component Value Date   WBC 3.4 (L) 03/16/2022   HGB 10.8 (L) 03/16/2022   HCT 34.8 (L) 03/16/2022   MCV 83.3 03/16/2022   PLT 125 (L) 03/16/2022   Hypophosphatemia: Currently she is on K-Phos Neutral 500 mg twice daily to complete 5 days.  Phosphorus was 1.3 on 03/16/2022.  Hyperlipidemia: Currently she is on rosuvastatin 10 mg 3 times per week.  Receiving H&H services, PT evaluation this past Sunday, not sure about how frequent she is going to have PT. At home she is using the walker most of the time, walking for a few minutes at a time.  Review of Systems  Constitutional:  Positive for activity change, appetite change and fatigue.  Respiratory:  Negative for wheezing.   Cardiovascular:  Negative for chest pain and palpitations.  Genitourinary:  Negative for dysuria and hematuria.  Musculoskeletal:  Positive for gait problem.  Neurological:  Negative for syncope and headaches.  Psychiatric/Behavioral:  Negative for confusion and hallucinations.   See other pertinent positives and negatives in HPI.  Current Outpatient Medications on File Prior to Visit  Medication Sig Dispense Refill   acetaminophen (TYLENOL) 325 MG tablet  Take 650 mg by mouth every 6 (six) hours as needed for mild pain.     BIOTIN PO Take 1 tablet by mouth daily.     carboxymethylcellulose (REFRESH PLUS) 0.5 % SOLN Place 1 drop into both eyes daily as needed (dry eyes).     feeding supplement (ENSURE ENLIVE / ENSURE PLUS) LIQD Take 237 mLs by mouth 2 (two) times daily between meals. 237 mL 12   fluocinonide cream (LIDEX) 1.82 % Apply 1 Application topically 2 (two) times daily as needed (eczema).     [START ON 03/23/2022] furosemide (LASIX) 20 MG tablet Take 1 tablet (20 mg total) by mouth daily. 30 tablet 2   guaiFENesin (ROBITUSSIN) 100 MG/5ML liquid Take 5 mLs by mouth every 4 (four) hours as needed for cough or to loosen phlegm. 120 mL 0   magic mouthwash w/lidocaine SOLN Take 10 mLs by mouth 4 (four) times daily. 1200 mL 0   metoprolol tartrate (LOPRESSOR) 25 MG tablet Take 0.5 tablets (12.5 mg total) by mouth 2 (two) times daily. 60 tablet 1   ondansetron (ZOFRAN) 8 MG tablet Take 1 tablet (8 mg total) by mouth every 8 (eight) hours as needed for nausea or vomiting. 20 tablet 3   oxyCODONE (ROXICODONE) 15 MG immediate release tablet Take 1 tablet (15 mg total) by mouth every 6 (six) hours as needed. (Patient taking differently: Take 15 mg by mouth every 12 (twelve) hours as needed for pain.) 90 tablet 0   oxyCODONE 30 MG 12 hr tablet Take 1 tablet (30 mg total) by mouth every 12 (twelve) hours. 60 tablet 0   pantoprazole (PROTONIX) 40 MG tablet Take 1 tablet (40 mg total) by mouth daily. 30 tablet 1   phosphorus (K PHOS NEUTRAL) 155-852-130 MG tablet Take 2 tablets (500 mg total) by mouth 2 (two) times daily for 5 days. 20 tablet 0   polyethylene glycol (MIRALAX / GLYCOLAX) 17 g packet Take 17 g by mouth daily.     [START ON 03/23/2022] potassium chloride SA (KLOR-CON M) 20 MEQ tablet Take 1 tablet (20 mEq total) by mouth daily.     Probiotic Product (ALIGN PO) Take 1 tablet by mouth daily.      sucralfate (CARAFATE) 1 g tablet Take 1 tablet (1 g  total) by mouth 4 (four) times daily -  with meals and at bedtime. 5 min before meals for radiation induced esophagitis 120 tablet 1   albuterol (VENTOLIN HFA) 108 (90 Base) MCG/ACT inhaler Inhale 2 puffs into the lungs every 6 (six) hours as needed for wheezing or shortness of breath. 8 g 2   azithromycin (ZITHROMAX Z-PAK) 250 MG tablet Use as directed 6 each 0   No current facility-administered medications on file prior to visit.    Past Medical History:  Diagnosis Date   Bilateral lower extremity edema    per pt left ankle greater than right weras left leg compression hose prn   Bladder cancer [facility]) 2021   urologist-- dr Milford Cage--- first dx 2021 s/p  TURBT 's with recurrences   CAD (coronary artery disease)    cardiac cath 01/ 2005 by dr Terrence Dupont,  non-obstructive cad;  nuclear study 07-31-2016  normal with no evidence ischemia,nuclear ef 52%  Diverticulosis of colon    Eczema    Fibromyalgia    GERD (gastroesophageal reflux disease)    Hemorrhoids    Hiatal hernia    History of adenomatous polyp of colon    History of cancer chemotherapy 2000   left breast cancer   History of COVID-19 2022   mild all symptoms resolved   History of diverticulitis of colon    History of gastritis    History of kidney stones    History of left breast cancer 2000   dx 2000 and s/p left mastectomy w/ node dissection;  completed chemo and radiation therapy same year 2000 and completed anti-estrogen 2005;  per pt released from oncologist 2006 and no recurrence   Hyperlipidemia    Hypertension    followed by pcp   Nephrolithiasis    per CT 11-24-2020 bilateral tiny non-obstructive stones   Personal history of radiation therapy 2000   left breast cancer   Wears denture upper    Wears glasses    Allergies  Allergen Reactions   Hydrochlorothiazide Other (See Comments)    High Ca++   Flomax [Tamsulosin] Itching   Codeine Itching   Statins Other (See Comments)    Muscle aches   Zetia  [Ezetimibe] Other (See Comments)    Muscle aches    Social History   Socioeconomic History   Marital status: Widowed    Spouse name: Not on file   Number of children: Not on file   Years of education: Not on file   Highest education level: Not on file  Occupational History   Not on file  Tobacco Use   Smoking status: Former    Packs/day: 1.00    Years: 15.00    Total pack years: 15.00    Types: Cigarettes    Quit date: 09/20/1998    Years since quitting: 23.5   Smokeless tobacco: Never  Vaping Use   Vaping Use: Never used  Substance and Sexual Activity   Alcohol use: Not Currently   Drug use: Never   Sexual activity: Not Currently    Birth control/protection: Surgical    Comment: 1st intercourse 76 yo-Fewer than 5 partners, hysterectomy  Other Topics Concern   Not on file  Social History Narrative   Widowed- husband died of Alzheimers in 02-22-2013.   Social Determinants of Health   Financial Resource Strain: Low Risk  (10/08/2021)   Overall Financial Resource Strain (CARDIA)    Difficulty of Paying Living Expenses: Not hard at all  Food Insecurity: No Food Insecurity (03/10/2022)   Hunger Vital Sign    Worried About Running Out of Food in the Last Year: Never true    Ran Out of Food in the Last Year: Never true  Transportation Needs: No Transportation Needs (03/10/2022)   PRAPARE - Hydrologist (Medical): No    Lack of Transportation (Non-Medical): No  Physical Activity: Inactive (10/08/2021)   Exercise Vital Sign    Days of Exercise per Week: 0 days    Minutes of Exercise per Session: 0 min  Stress: No Stress Concern Present (10/08/2021)   [place]    Feeling of Stress : Not at all  Social Connections: Moderately Isolated (10/03/2020)   Social Connection and Isolation Panel [NHANES]    Frequency of Communication with Friends and Family: Twice a week    Frequency of  Social Gatherings with Friends and Family: Twice a week  Attends Religious Services: More than 4 times per year    Active Member of Clubs or Organizations: No    Attends Archivist Meetings: Never    Marital Status: Widowed    Vitals:   03/19/22 1100  BP: 120/80  Pulse: (!) 107  Resp: 16  SpO2: 97%   Body mass index is 28.43 kg/m.  Physical Exam Vitals and nursing note reviewed.  Constitutional:      General: She is not in acute distress.    Appearance: She is well-developed.  HENT:     Head: Normocephalic and atraumatic.     Mouth/Throat:     Mouth: Mucous membranes are moist.     Pharynx: Oropharynx is clear.  Eyes:     Conjunctiva/sclera: Conjunctivae normal.  Cardiovascular:     Rate and Rhythm: Normal rate and regular rhythm.     Heart sounds: No murmur heard.    Comments: HR 96/min Pulmonary:     Effort: Pulmonary effort is normal. No respiratory distress.     Breath sounds: Examination of the right-lower field reveals rales. Examination of the left-lower field reveals decreased breath sounds. Decreased breath sounds and rales present. No wheezing or rhonchi.  Abdominal:     Palpations: Abdomen is soft.     Tenderness: There is generalized abdominal tenderness. There is no guarding or rebound.  Musculoskeletal:     Right lower leg: No edema.     Left lower leg: No edema.  Skin:    General: Skin is warm.     Findings: No erythema or rash.  Neurological:     Mental Status: She is alert and oriented to person, place, and time.     Comments: In a wheel chair.  Psychiatric:     Comments: Well groomed, good eye contact.   ASSESSMENT AND PLAN:  Ms.Athelene was seen today for hospitalization follow-up.  Diagnoses and all orders for this visit:  Orders Placed This Encounter  Procedures   DG Chest 2 View   Mixed hyperlipidemia Recommend stopping rosuvastatin for now. We can consider resuming statin medication once she feels better and nausea  resolves.  GERD (gastroesophageal reflux disease) Continue Protonix 40 mg daily, sucralfate 1 g before meals, and GERD precautions.  Radiation-induced esophagitis Pain has improved. Continue mouthwash with lidocaine 4 times daily as needed. We discussed the importance of keeping up with fluid and oral intake. We explored some options including ice cream, pudding, juice mixed with water one-to-one ratio.  For nausea she can increase dose of Zofran from twice daily to 3 times daily.  Other options like Phenergan discussed as well as side effects.  Because risk of dehydration, recommend furosemide 20 mg daily as needed instead daily.  HTN (hypertension) BP today adequately controlled. For now continue metoprolol titrate 25 mg 1/2 tablet twice daily. BP needs to be monitored regularly.  Metastatic cancer to bone [facility]) Status post radiation therapy. Today she has an appointment with her oncologist to discuss prognosis and treatment options. She has already been evaluated by palliative care. Today we discontinued some of her medications and OTC supplements/multivitamins.  Chest rales Today on auscultation noted mild right basal rales and mild left base hypoventilation. She has to go now to see her oncologist, family will ask if CXR can be done at Dr Arnoldo Lenis office and if not they will bring her here.  She reports today DOE, which she has had since hospital discharge. Could be atelectasis,related with mets disease, or acute infectious process  among some. Recommend incentive like expiratory exercises through the day.  Chest x-ray on 03/10/2022 showed low volume chest with diffuse interstitial coarsening that has reticulonodular appearance.   Recommendations in regard to abx treatment will be given according to CXR result.  Hypophosphatemia Completed 5 days of phosphorus supplementation. She cannot have labs now because running late for oncology visit. If not checked during visit, will  bring her back this week for blood work.  Thrombocytopenia (HCC) Leukopenia and thrombocytopenia in the setting of chemoradiation. It seems to be stable. Continue monitoring for signs of bleeding. She is following with oncologist today.  I spent a total of 54 minutes in both face to face and non face to face activities for this visit on the date of this encounter. During this time history was obtained and documented, examination was performed, prior labs/imaging reviewed, and assessment/plan discussed.  Called pt at 6;02 pm and spoke with her son, she is now being evaluated by hospice. According to her son, she did not have blood work today.  Different options were discussed with her oncologist and she was given "some time" to decide. CXR report is pending. She was started on azithromycin. Her son will ask her after hospice visit if she would like for [place] to have labs done.  Return if symptoms worsen or fail to improve, for Will be determined after visit with oncologist..  Kimoni Pickerill G. [place], MD  [facility]. [place] office.

## 2022-03-19 ENCOUNTER — Other Ambulatory Visit: Payer: Self-pay

## 2022-03-19 ENCOUNTER — Ambulatory Visit (HOSPITAL_COMMUNITY)
Admission: RE | Admit: 2022-03-19 | Discharge: 2022-03-19 | Disposition: A | Discharge status: Home / Self Care | Payer: Medicare Other | Source: Ambulatory Visit | Attending: Family Medicine | Admitting: Family Medicine

## 2022-03-19 ENCOUNTER — Inpatient Hospital Stay: Payer: Medicare Other | Admitting: Hematology and Oncology

## 2022-03-19 ENCOUNTER — Ambulatory Visit (INDEPENDENT_AMBULATORY_CARE_PROVIDER_SITE_OTHER): Payer: Medicare Other | Admitting: Family Medicine

## 2022-03-19 ENCOUNTER — Encounter: Payer: Self-pay | Admitting: Family Medicine

## 2022-03-19 VITALS — BP 120/80 | HR 107 | Resp 16 | Ht 68.0 in

## 2022-03-19 VITALS — BP 121/77 | HR 112 | Temp 97.9°F | Resp 18 | Ht 68.0 in | Wt 176.0 lb

## 2022-03-19 DIAGNOSIS — I10 Essential (primary) hypertension: Secondary | ICD-10-CM

## 2022-03-19 DIAGNOSIS — J90 Pleural effusion, not elsewhere classified: Secondary | ICD-10-CM | POA: Diagnosis not present

## 2022-03-19 DIAGNOSIS — C7951 Secondary malignant neoplasm of bone: Secondary | ICD-10-CM

## 2022-03-19 DIAGNOSIS — R0989 Other specified symptoms and signs involving the circulatory and respiratory systems: Secondary | ICD-10-CM | POA: Diagnosis not present

## 2022-03-19 DIAGNOSIS — K2100 Gastro-esophageal reflux disease with esophagitis, without bleeding: Secondary | ICD-10-CM | POA: Diagnosis not present

## 2022-03-19 DIAGNOSIS — E782 Mixed hyperlipidemia: Secondary | ICD-10-CM

## 2022-03-19 DIAGNOSIS — T66XXXA Radiation sickness, unspecified, initial encounter: Secondary | ICD-10-CM

## 2022-03-19 DIAGNOSIS — D696 Thrombocytopenia, unspecified: Secondary | ICD-10-CM

## 2022-03-19 DIAGNOSIS — K208 Other esophagitis without bleeding: Secondary | ICD-10-CM

## 2022-03-19 MED ORDER — ALBUTEROL SULFATE HFA 108 (90 BASE) MCG/ACT IN AERS: 2.0000 | INHALATION_SPRAY | Freq: Four times a day (QID) | RESPIRATORY_TRACT | 2 refills | Status: AC | PRN

## 2022-03-19 MED ORDER — AZITHROMYCIN 250 MG PO TABS: ORAL_TABLET | ORAL | 0 refills | Status: AC

## 2022-03-19 NOTE — Assessment & Plan Note (Signed)
Leukopenia and thrombocytopenia in the setting of chemoradiation. It seems to be stable. Continue monitoring for signs of bleeding. She is following with oncologist today.

## 2022-03-19 NOTE — Patient Instructions (Addendum)
A few things to remember from today's visit:  Chest rales - Plan: DG Chest 2 View  Hypertension, unspecified type  Gastroesophageal reflux disease with esophagitis without hemorrhage  Take Zofran every 8 hours. Furosemide daily as needed. Try to keep with fluid intake. Chest X ray ordered today. Deep breathing a few times per day to avoid mucus plugs.   If you need refills for medications you take chronically, please call your pharmacy. Do not use My Chart to request refills or for acute issues that need immediate attention. If you send a my chart message, it may take a few days to be addressed, specially if I am not in the office.  Please be sure medication list is accurate. If a new problem present, please set up appointment sooner than planned today.

## 2022-03-19 NOTE — Assessment & Plan Note (Addendum)
Pain has improved. Continue mouthwash with lidocaine 4 times daily as needed. We discussed the importance of keeping up with fluid and oral intake. We explored some options including ice cream, pudding, juice mixed with water one-to-one ratio.  For nausea she can increase dose of Zofran from twice daily to 3 times daily.  Other options like Phenergan discussed as well as side effects.  Because risk of dehydration, recommend furosemide 20 mg daily as needed instead daily.

## 2022-03-19 NOTE — Telephone Encounter (Signed)
Patient scheduled for 03/19/22

## 2022-03-19 NOTE — Assessment & Plan Note (Signed)
Metastatic breast cancer with lung and bone metastases Right chest wall mass biopsy: Metastatic adenocarcinoma consistent with breast primary, triple negative, Ki-67 40%, HER2 1+   Recommendation: Caris molecular testing to determine if she would benefit from immunotherapy. If PD-L1 is negative then we will consider treatment with Xeloda or Taxol or Trodelvy. If PD-L1 is positive then we will treat her with [facility] immunotherapy. Bone metastases: She will start Xgeva today.   Pain control: Currently on OxyContin and oxycodone. Currently pain is at 10/10 Will increase dose of Oxycontin to 30 mg bid They had difficulty getting it authorized.  I will send a prescription to [facility].   Thrush: Causing severe throat pain: sent for Diflucan. Nausea and vomiting: Patient has nausea medication. Currently finishing radiation tomorrow. Palliative care has been consulted.   Caris molecular testing: HER2 low, triple negative, PIK 3 CA mutation: No other mutation detected, PD-L1 negative BRCA 1 and 2 negative TMB low 8 mutations per MB

## 2022-03-19 NOTE — Assessment & Plan Note (Signed)
Completed 5 days of phosphorus supplementation. She cannot have labs now because running late for oncology visit. If not checked during visit, will bring her back this week for blood work.

## 2022-03-19 NOTE — Assessment & Plan Note (Signed)
Continue Protonix 40 mg daily, sucralfate 1 g before meals, and GERD precautions.

## 2022-03-19 NOTE — Assessment & Plan Note (Addendum)
Today on auscultation noted mild right basal rales and mild left base hypoventilation. She has to go now to see her oncologist, family will ask if CXR can be done at Dr Arnoldo Lenis office and if not they will bring her here.  She reports today DOE, which she has had since hospital discharge. Could be atelectasis,related with mets disease, or acute infectious process among some. Recommend incentive like expiratory exercises through the day.  Chest x-ray on 03/10/2022 showed low volume chest with diffuse interstitial coarsening that has reticulonodular appearance.   Recommendations in regard to abx treatment will be given according to CXR result.

## 2022-03-19 NOTE — Assessment & Plan Note (Signed)
Status post radiation therapy. Today she has an appointment with her oncologist to discuss prognosis and treatment options. She has already been evaluated by palliative care. Today we discontinued some of her medications and OTC supplements/multivitamins.

## 2022-03-19 NOTE — Assessment & Plan Note (Signed)
BP today adequately controlled. For now continue metoprolol titrate 25 mg 1/2 tablet twice daily. BP needs to be monitored regularly.

## 2022-03-19 NOTE — Assessment & Plan Note (Signed)
Recommend stopping rosuvastatin for now. We can consider resuming statin medication once she feels better and nausea resolves.

## 2022-03-19 NOTE — Progress Notes (Signed)
Patient Care Team: Martinique, Betty G, MD as PCP - General (Family Medicine) Croitoru, Dani Gobble, MD as PCP - Cardiology (Cardiology) Fontaine, Belinda Block, MD (Inactive) as Consulting Physician (Gynecology) Juanita Craver, MD as Consulting Physician (Gastroenterology) Charolette Forward, MD as Consulting Physician (Cardiology) Nicholas Lose, MD as Medical Oncologist (Hematology and Oncology)  DIAGNOSIS:  Encounter Diagnosis  Name Primary?   Metastatic cancer to bone ([place]) Yes    SUMMARY OF ONCOLOGIC HISTORY: Oncology History  Metastatic cancer to bone ([place])  12/1998 Initial Diagnosis   Dr. Virgie Dad patient originally left modified radical mastectomy and ALND: 6.2 cm mixed breast cancer 2/21 lymph nodes positive, ER/PR positive, HER2 negative, treated with Adriamycin Cytoxan x4 followed by Taxotere x4 followed by radiation and tamoxifen followed by anastrozole   03/21/2020 Miscellaneous   Noninvasive high-grade urothelial cancer with early papillary formation status post TURBT and intravesical BCG   02/16/2022 Imaging   CT chest: 4.1 cm left upper lobe lung mass, innumerable micronodules, osseous metastatic disease right eighth rib left seventh rib T10, T11, acute fracture right seventh rib.  MRI spine: Multifocal bone metastases throughout the spine and ribs sacrum and iliac bones.  MRI brain: Osseous lesions left and right parietal bones, right frontal bone, left mandible   02/18/2022 Relapse/Recurrence   Right chest wall needle core biopsy: Metastatic adenocarcinoma positive for CK7 negative for CK20, GATA3, ER, PAX8 and TTF-1, consistent with breast primary, ER 0%, PR 0%, HER2 1+, Ki-67 40%     CHIEF COMPLIANT: Follow-up to discuss the treatment plan  INTERVAL HISTORY: [name] is a 76 year old above-mentioned history of metastatic breast cancer who is here accompanied by her family to discuss treatment plan.  She had Caris molecular testing.  She tells me that the pain is under  reasonable control although she continues to have a cough with productive sputum that has been bothering her a lot.  She has been waking up at night for this cough.   ALLERGIES:  is allergic to hydrochlorothiazide, flomax [tamsulosin], codeine, statins, and zetia [ezetimibe].  MEDICATIONS:  Current Outpatient Medications  Medication Sig Dispense Refill   albuterol (VENTOLIN HFA) 108 (90 Base) MCG/ACT inhaler Inhale 2 puffs into the lungs every 6 (six) hours as needed for wheezing or shortness of breath. 8 g 2   azithromycin (ZITHROMAX Z-PAK) 250 MG tablet Use as directed 6 each 0   acetaminophen (TYLENOL) 325 MG tablet Take 650 mg by mouth every 6 (six) hours as needed for mild pain.     BIOTIN PO Take 1 tablet by mouth daily.     carboxymethylcellulose (REFRESH PLUS) 0.5 % SOLN Place 1 drop into both eyes daily as needed (dry eyes).     feeding supplement (ENSURE ENLIVE / ENSURE PLUS) LIQD Take 237 mLs by mouth 2 (two) times daily between meals. 237 mL 12   fluocinonide cream (LIDEX) 8.89 % Apply 1 Application topically 2 (two) times daily as needed (eczema).     [START ON 03/23/2022] furosemide (LASIX) 20 MG tablet Take 1 tablet (20 mg total) by mouth daily. 30 tablet 2   guaiFENesin (ROBITUSSIN) 100 MG/5ML liquid Take 5 mLs by mouth every 4 (four) hours as needed for cough or to loosen phlegm. 120 mL 0   magic mouthwash w/lidocaine SOLN Take 10 mLs by mouth 4 (four) times daily. 1200 mL 0   metoprolol tartrate (LOPRESSOR) 25 MG tablet Take 0.5 tablets (12.5 mg total) by mouth 2 (two) times daily. 60 tablet 1   ondansetron (ZOFRAN)  8 MG tablet Take 1 tablet (8 mg total) by mouth every 8 (eight) hours as needed for nausea or vomiting. 20 tablet 3   oxyCODONE (ROXICODONE) 15 MG immediate release tablet Take 1 tablet (15 mg total) by mouth every 6 (six) hours as needed. (Patient taking differently: Take 15 mg by mouth every 12 (twelve) hours as needed for pain.) 90 tablet 0   oxyCODONE 30 MG 12 hr  tablet Take 1 tablet (30 mg total) by mouth every 12 (twelve) hours. 60 tablet 0   pantoprazole (PROTONIX) 40 MG tablet Take 1 tablet (40 mg total) by mouth daily. 30 tablet 1   phosphorus (K PHOS NEUTRAL) [id number] MG tablet Take 2 tablets (500 mg total) by mouth 2 (two) times daily for 5 days. 20 tablet 0   polyethylene glycol (MIRALAX / GLYCOLAX) 17 g packet Take 17 g by mouth daily.     [START ON 03/23/2022] potassium chloride SA (KLOR-CON M) 20 MEQ tablet Take 1 tablet (20 mEq total) by mouth daily.     Probiotic Product (ALIGN PO) Take 1 tablet by mouth daily.      sucralfate (CARAFATE) 1 g tablet Take 1 tablet (1 g total) by mouth 4 (four) times daily -  with meals and at bedtime. 5 min before meals for radiation induced esophagitis 120 tablet 1   No current facility-administered medications for this visit.    PHYSICAL EXAMINATION: ECOG PERFORMANCE STATUS: 1 - Symptomatic but completely ambulatory  Vitals:   03/19/22 1235  BP: 121/77  Pulse: (!) 112  Resp: 18  Temp: 97.9 F (36.6 C)  SpO2: 99%   Filed Weights   03/19/22 1235  Weight: 176 lb (79.8 kg)      LABORATORY DATA:  I have reviewed the data as listed    Latest Ref Rng & Units 03/16/2022    3:08 AM 03/15/2022    3:24 AM 03/14/2022    3:47 AM  CMP  Glucose 70 - 99 mg/dL 117  113  120   BUN 8 - 23 mg/dL _0 Creatinine 0.44 - 1.00 mg/dL 0.51  0.42  0.44   Sodium 135 - 145 mmol/L 135  136  136   Potassium 3.5 - 5.1 mmol/L 3.9  3.9  3.8   Chloride 98 - 111 mmol/L 103  106  106   CO2 22 - 32 mmol/L _1 Calcium 8.9 - 10.3 mg/dL 9.0  8.9  8.9     Lab Results  Component Value Date   WBC 3.4 (L) 03/16/2022   HGB 10.8 (L) 03/16/2022   HCT 34.8 (L) 03/16/2022   MCV 83.3 03/16/2022   PLT 125 (L) 03/16/2022   NEUTROABS 2.1 03/16/2022    ASSESSMENT & PLAN:  Metastatic cancer to bone [facility]) Metastatic breast cancer with lung and bone metastases Right chest wall mass biopsy: Metastatic  adenocarcinoma consistent with breast primary, triple negative, Ki-67 40%, HER2 1+   Recommendation: Caris molecular testing to determine if she would benefit from immunotherapy. If PD-L1 is negative then we will consider treatment with Xeloda or Taxol or Trodelvy. If PD-L1 is positive then we will treat her with [facility] immunotherapy. Bone metastases: She will start Xgeva today.   Pain control: Appears to be much better controlled   Thrush: Causing severe throat pain: On Diflucan. Nausea and vomiting: Patient has nausea medication.     Caris molecular testing: HER2 low, triple negative, PIK 3 CA  mutation: No other mutation detected, PD-L1 negative BRCA 1 and 2 negative TMB low 8 mutations per MB  Treatment option discussed: Enhertu.  We discussed the pros and cons of Enhertu including the risk of nausea and vomiting, fatigue, cytopenias, LFT changes etc. She will think about it and will inform us if she wants to proceed with treatment. If not she will go to hospice.  No orders of the defined types were placed in this encounter.  The patient has a good understanding of the overall plan. she agrees with it. she will call with any problems that may develop before the next visit here. Total time spent: 30 mins including face to face time and time spent for planning, charting and co-ordination of care   Harriette Ohara, MD 03/19/22    I Gardiner Coins am scribing for Dr. Lindi Adie  I have reviewed the above documentation for accuracy and completeness, and I agree with the above.

## 2022-04-04 NOTE — Progress Notes (Signed)
                                                                                                                                                             Patient Name: [name] MRN: [id number] DOB: 1945/09/17 Referring Physician: Alvy Bimler NI (Profile Not Attached) Date of Service: 03/05/2022 [facility], [place]                                                        End Of Treatment Note  Diagnoses: C79.51-Secondary malignant neoplasm of bone  Cancer Staging:   76 y.o. woman with painful bone metastases from stage IV breast cancer  Intent: Palliative  Radiation Treatment Dates: 02/20/2022 through 03/05/2022 Site Technique Total Dose (Gy) Dose per Fx (Gy) Completed Fx Beam Energies  Thoracic Spine: Spine_T7-L3 3D 30/30 3 10/10 10X, 15X  Ribs, Right: Chest_R_8th_Rib 3D 30/30 3 10/10 6X, 10X   Narrative: The patient tolerated radiation therapy relatively well. She reported mild dysphagia, fatigue and skin dryness in the treatment field.  Plan: The patient will receive a call in about one month from the radiation oncology department. She will continue follow up with Dr. Alvy Bimler as well.  ------------------------------------------------   Tyler Pita, MD [facility]: [phone]  Fax: [phone] [email or web address]  Skype  [email or web address]

## 2022-04-22 DEATH — deceased

## 2022-05-08 ENCOUNTER — Other Ambulatory Visit: Payer: Self-pay | Admitting: Family Medicine

## 2022-05-08 DIAGNOSIS — E876 Hypokalemia: Secondary | ICD-10-CM

## 2022-05-08 DIAGNOSIS — I10 Essential (primary) hypertension: Secondary | ICD-10-CM

## 2022-05-27 ENCOUNTER — Ambulatory Visit: Payer: Medicare Other

## 2022-06-20 IMAGING — MG MM DIGITAL SCREENING UNILAT*R* W/ TOMO W/ CAD
6 series · 6 of 18 positions shown · non-contrast
Comparison: Previous exam(s).

CLINICAL DATA: Screening.

EXAM:
DIGITAL SCREENING UNILATERAL RIGHT MAMMOGRAM WITH CAD AND
TOMOSYNTHESIS
TECHNIQUE: Right screening digital craniocaudal and mediolateral oblique
mammograms were obtained. Right screening digital breast
tomosynthesis was performed. The images were evaluated with
computer-aided detection.

[R CC synth-2D (1 of 2)]
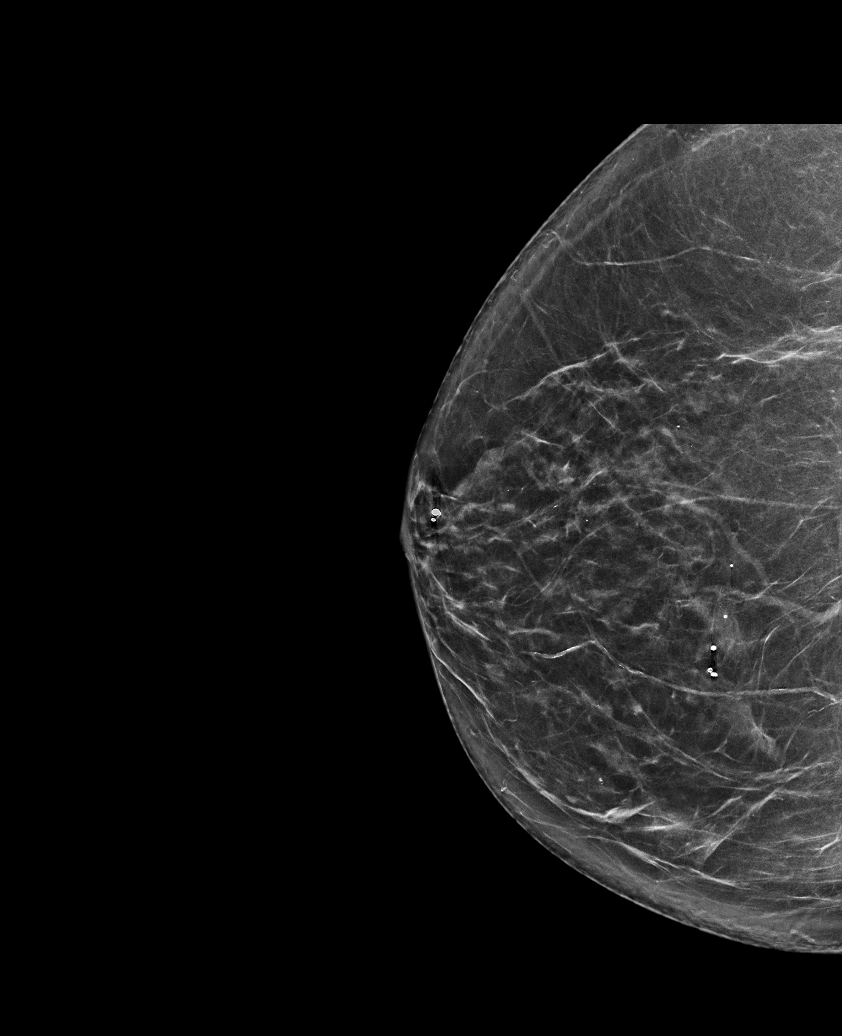

[R MLO synth-2D]
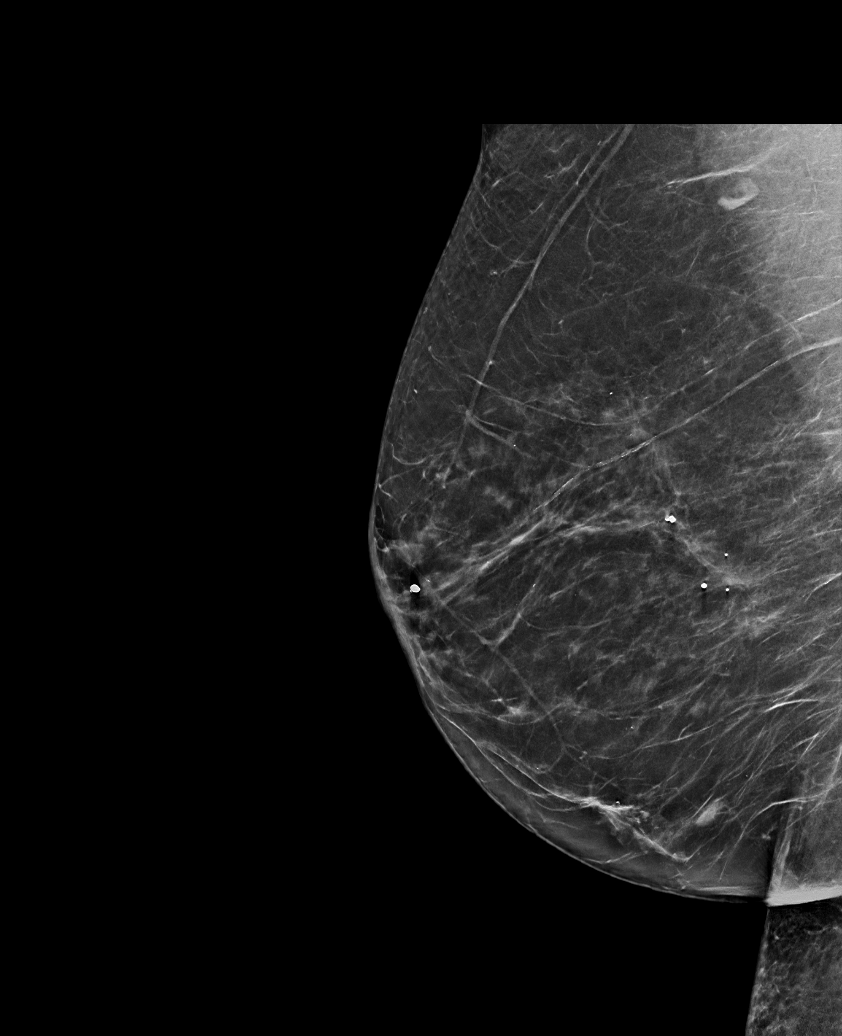

[R CC synth-2D (2 of 2)]
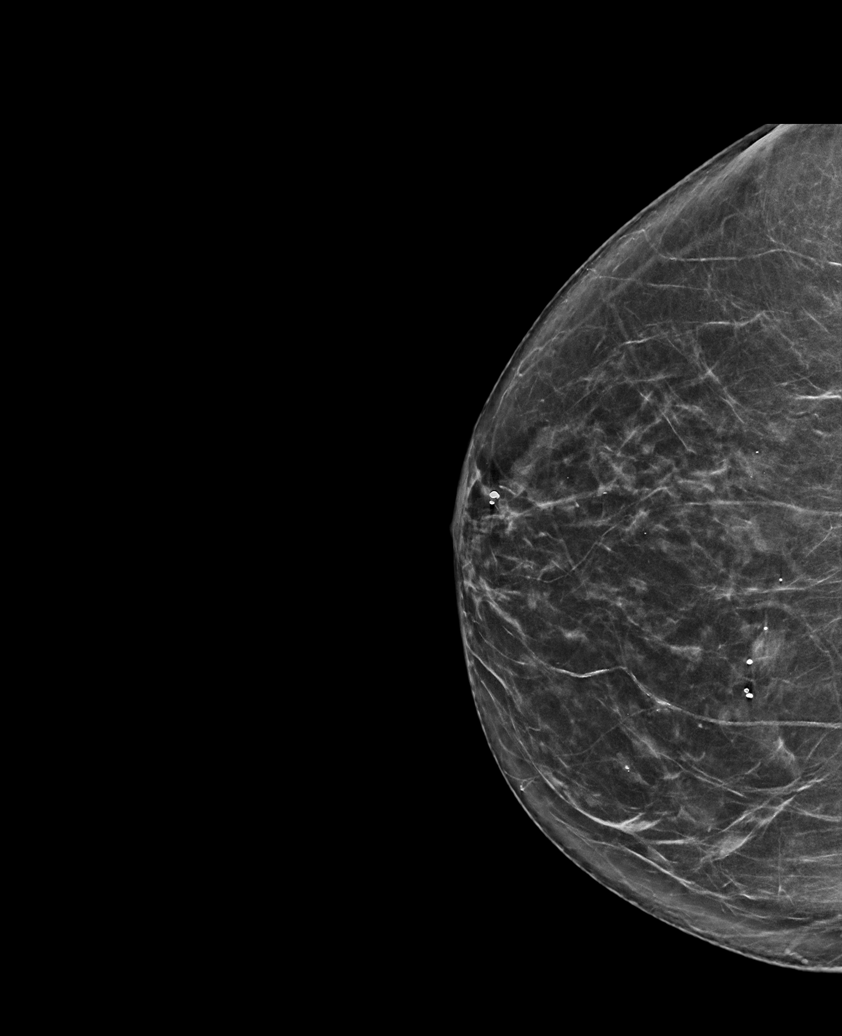

[R CC tomo (1 of 2) · tomo slice 35/69.0]
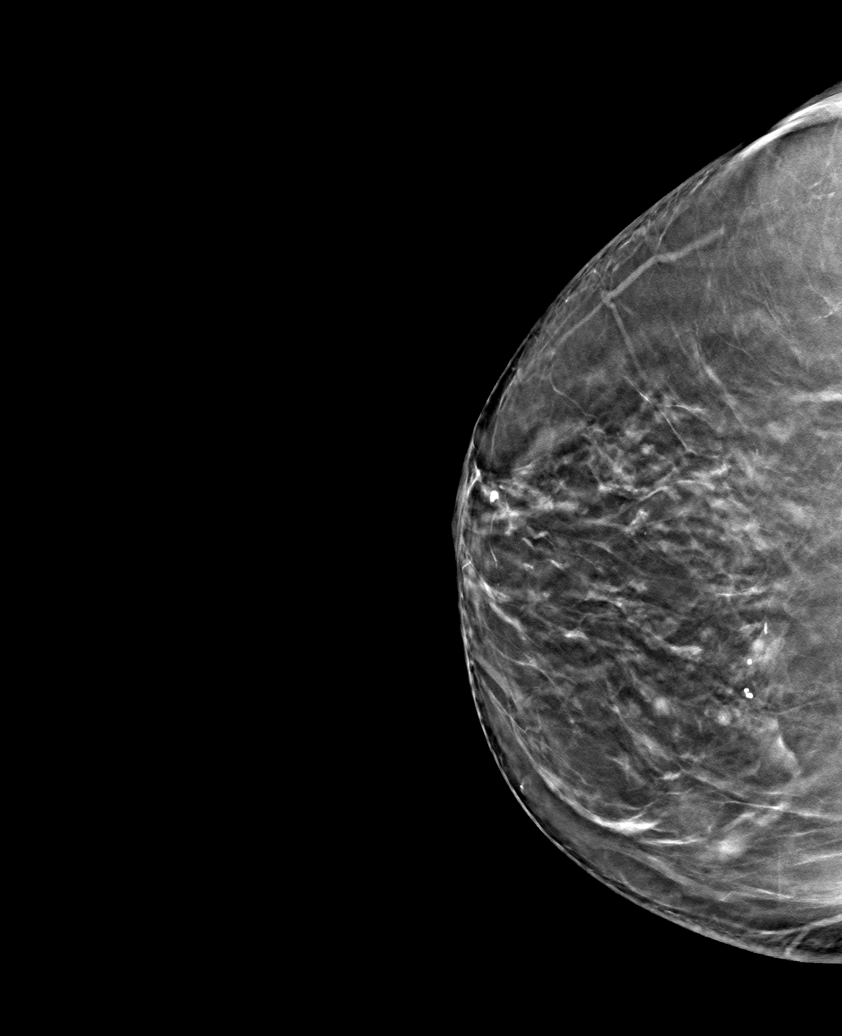

[R CC tomo (2 of 2) · tomo slice 35/69.0]
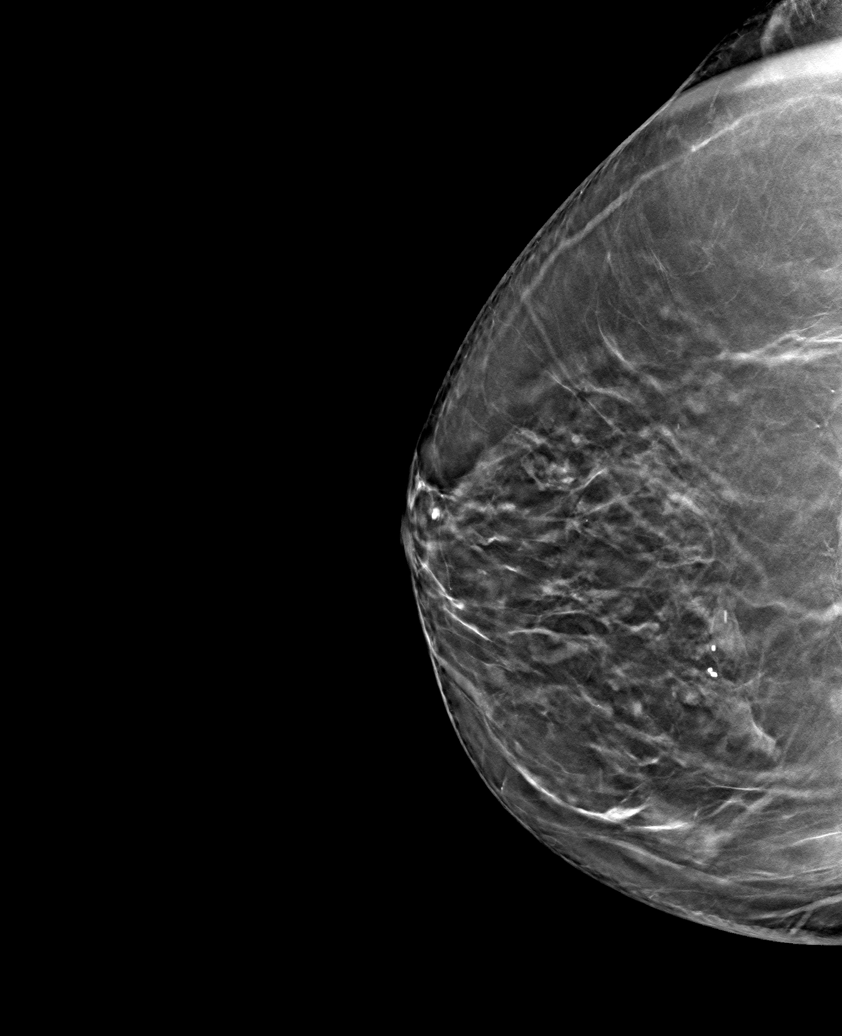

[R MLO tomo · tomo slice 37/73.0]
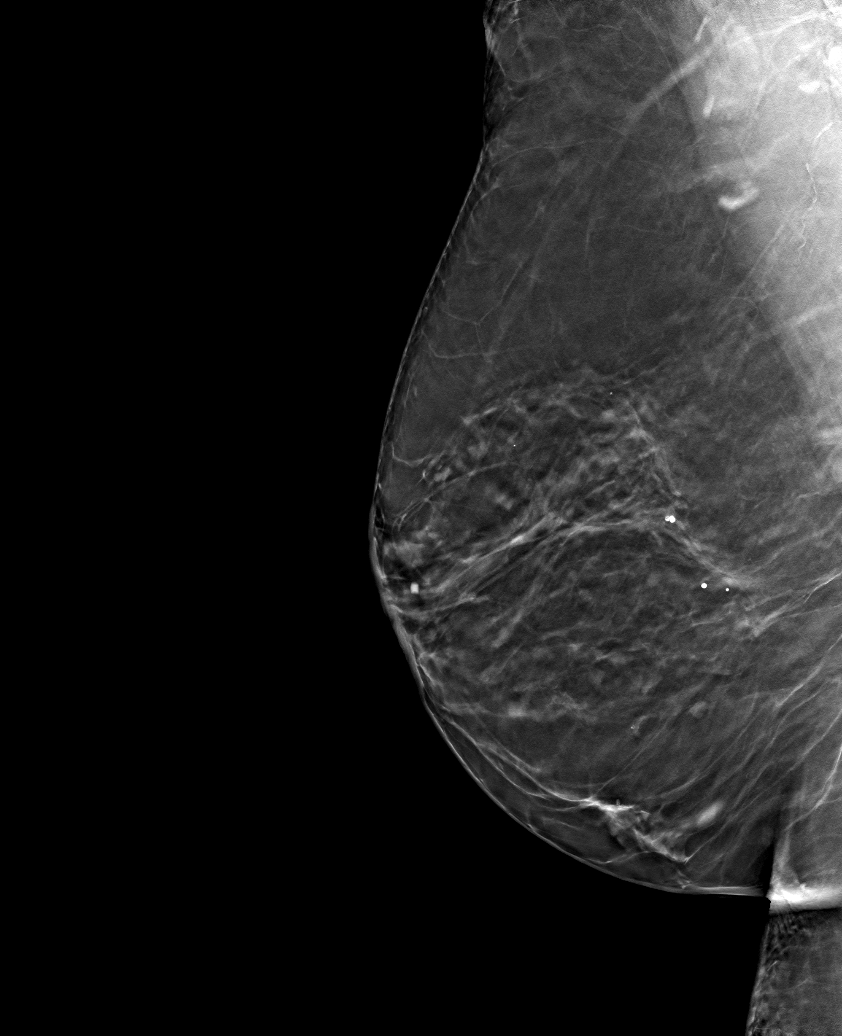

[6 of 18 positions shown; findings below may reference images not displayed]

ACR Breast Density Category b: There are scattered areas of
fibroglandular density.
FINDINGS: The patient has had a left mastectomy. There are no findings
suspicious for malignancy.
IMPRESSION: No mammographic evidence of malignancy. A result letter of this
screening mammogram will be mailed directly to the patient.

RECOMMENDATION:
Screening mammogram in one year.  (Code:NT-E-EGT)

BI-RADS CATEGORY  1: Negative.

## 2022-08-19 ENCOUNTER — Ambulatory Visit: Payer: Medicare Other

## 2022-11-11 ENCOUNTER — Ambulatory Visit: Payer: Medicare Other

## 2023-02-03 ENCOUNTER — Ambulatory Visit: Payer: Medicare Other
